# Patient Record
Sex: Male | Born: 1945 | Race: White | Hispanic: No | Marital: Married | Smoking: Never smoker
Health system: Southern US, Community
[De-identification: ages and names within clinical notes are randomized; demographics above are authoritative.]

## PROBLEM LIST (undated history)

## (undated) DIAGNOSIS — G2581 Restless legs syndrome: Secondary | ICD-10-CM

## (undated) DIAGNOSIS — F32A Depression, unspecified: Secondary | ICD-10-CM

## (undated) DIAGNOSIS — M1990 Unspecified osteoarthritis, unspecified site: Secondary | ICD-10-CM

## (undated) DIAGNOSIS — I10 Essential (primary) hypertension: Secondary | ICD-10-CM

## (undated) DIAGNOSIS — C801 Malignant (primary) neoplasm, unspecified: Secondary | ICD-10-CM

## (undated) DIAGNOSIS — E119 Type 2 diabetes mellitus without complications: Secondary | ICD-10-CM

## (undated) DIAGNOSIS — I639 Cerebral infarction, unspecified: Secondary | ICD-10-CM

## (undated) DIAGNOSIS — K449 Diaphragmatic hernia without obstruction or gangrene: Secondary | ICD-10-CM

## (undated) DIAGNOSIS — K759 Inflammatory liver disease, unspecified: Secondary | ICD-10-CM

## (undated) DIAGNOSIS — J449 Chronic obstructive pulmonary disease, unspecified: Secondary | ICD-10-CM

## (undated) DIAGNOSIS — F329 Major depressive disorder, single episode, unspecified: Secondary | ICD-10-CM

## (undated) DIAGNOSIS — K7460 Unspecified cirrhosis of liver: Secondary | ICD-10-CM

## (undated) DIAGNOSIS — K920 Hematemesis: Secondary | ICD-10-CM

## (undated) DIAGNOSIS — E785 Hyperlipidemia, unspecified: Secondary | ICD-10-CM

## (undated) DIAGNOSIS — K922 Gastrointestinal hemorrhage, unspecified: Secondary | ICD-10-CM

## (undated) DIAGNOSIS — G4733 Obstructive sleep apnea (adult) (pediatric): Secondary | ICD-10-CM

## (undated) DIAGNOSIS — Z8719 Personal history of other diseases of the digestive system: Secondary | ICD-10-CM

## (undated) HISTORY — DX: Hematemesis: K92.0

## (undated) HISTORY — PX: CARDIAC CATHETERIZATION: SHX172

## (undated) HISTORY — PX: EYE SURGERY: SHX253

## (undated) HISTORY — DX: Hyperlipidemia, unspecified: E78.5

## (undated) HISTORY — PX: NOSE SURGERY: SHX723

## (undated) HISTORY — PX: APPENDECTOMY: SHX54

## (undated) HISTORY — DX: Diaphragmatic hernia without obstruction or gangrene: K44.9

## (undated) HISTORY — DX: Restless legs syndrome: G25.81

## (undated) HISTORY — DX: Gastrointestinal hemorrhage, unspecified: K92.2

---

## 2004-03-09 ENCOUNTER — Other Ambulatory Visit: Payer: Self-pay

## 2004-03-26 DIAGNOSIS — E119 Type 2 diabetes mellitus without complications: Secondary | ICD-10-CM | POA: Insufficient documentation

## 2004-05-24 ENCOUNTER — Other Ambulatory Visit: Payer: Self-pay

## 2004-08-26 ENCOUNTER — Other Ambulatory Visit: Payer: Self-pay

## 2004-10-29 ENCOUNTER — Emergency Department: Payer: Self-pay | Admitting: Unknown Physician Specialty

## 2004-10-29 ENCOUNTER — Other Ambulatory Visit: Payer: Self-pay

## 2004-11-03 ENCOUNTER — Other Ambulatory Visit: Payer: Self-pay

## 2004-11-03 ENCOUNTER — Emergency Department: Payer: Self-pay | Admitting: Emergency Medicine

## 2005-09-19 ENCOUNTER — Emergency Department: Payer: Self-pay | Admitting: Emergency Medicine

## 2005-12-21 ENCOUNTER — Emergency Department: Payer: Self-pay | Admitting: General Practice

## 2005-12-21 ENCOUNTER — Other Ambulatory Visit: Payer: Self-pay

## 2006-08-19 ENCOUNTER — Other Ambulatory Visit: Payer: Self-pay

## 2006-08-19 ENCOUNTER — Emergency Department: Payer: Self-pay | Admitting: Emergency Medicine

## 2008-06-05 ENCOUNTER — Emergency Department: Payer: Self-pay | Admitting: Emergency Medicine

## 2008-06-05 ENCOUNTER — Other Ambulatory Visit: Payer: Self-pay

## 2008-06-28 ENCOUNTER — Emergency Department: Payer: Self-pay | Admitting: Emergency Medicine

## 2008-09-01 ENCOUNTER — Other Ambulatory Visit: Payer: Self-pay

## 2008-09-02 ENCOUNTER — Inpatient Hospital Stay: Payer: Self-pay | Admitting: Internal Medicine

## 2009-07-25 ENCOUNTER — Inpatient Hospital Stay: Payer: Self-pay | Admitting: *Deleted

## 2009-08-19 ENCOUNTER — Ambulatory Visit: Payer: Self-pay | Admitting: Internal Medicine

## 2010-05-25 ENCOUNTER — Emergency Department: Payer: Self-pay | Admitting: Unknown Physician Specialty

## 2010-05-28 ENCOUNTER — Ambulatory Visit: Payer: Self-pay | Admitting: Internal Medicine

## 2010-06-11 ENCOUNTER — Ambulatory Visit: Payer: Self-pay | Admitting: Ophthalmology

## 2010-06-13 ENCOUNTER — Emergency Department: Payer: Self-pay | Admitting: Emergency Medicine

## 2010-06-18 ENCOUNTER — Ambulatory Visit: Payer: Self-pay | Admitting: Ophthalmology

## 2010-07-08 ENCOUNTER — Ambulatory Visit: Payer: Self-pay | Admitting: Ophthalmology

## 2010-08-07 ENCOUNTER — Ambulatory Visit: Payer: Self-pay | Admitting: Ophthalmology

## 2010-08-13 ENCOUNTER — Ambulatory Visit: Payer: Self-pay | Admitting: Ophthalmology

## 2010-10-12 ENCOUNTER — Inpatient Hospital Stay: Payer: Self-pay | Admitting: Internal Medicine

## 2011-03-22 ENCOUNTER — Ambulatory Visit: Payer: Self-pay | Admitting: Internal Medicine

## 2011-03-23 ENCOUNTER — Inpatient Hospital Stay: Payer: Self-pay | Admitting: Internal Medicine

## 2011-04-10 ENCOUNTER — Ambulatory Visit: Payer: Self-pay | Admitting: Internal Medicine

## 2011-04-21 ENCOUNTER — Ambulatory Visit: Payer: Self-pay | Admitting: Internal Medicine

## 2011-05-04 ENCOUNTER — Inpatient Hospital Stay: Payer: Self-pay | Admitting: Internal Medicine

## 2011-12-22 ENCOUNTER — Ambulatory Visit: Payer: Self-pay | Admitting: Internal Medicine

## 2012-01-04 ENCOUNTER — Inpatient Hospital Stay: Payer: Self-pay | Admitting: Internal Medicine

## 2012-01-04 LAB — URINALYSIS, COMPLETE
Bilirubin,UR: NEGATIVE
Hyaline Cast: 1
Ketone: NEGATIVE
Leukocyte Esterase: NEGATIVE
Nitrite: NEGATIVE
Ph: 5 (ref 4.5–8.0)
Protein: NEGATIVE
Specific Gravity: 1.012 (ref 1.003–1.030)
Squamous Epithelial: NONE SEEN

## 2012-01-04 LAB — IRON AND TIBC
Iron Bind.Cap.(Total): 445 ug/dL (ref 250–450)
Iron Saturation: 14 %
Unbound Iron-Bind.Cap.: 384 ug/dL

## 2012-01-04 LAB — FOLATE: Folic Acid: 25.9 ng/mL (ref 3.1–100.0)

## 2012-01-04 LAB — COMPREHENSIVE METABOLIC PANEL
Albumin: 3.3 g/dL — ABNORMAL LOW (ref 3.4–5.0)
Alkaline Phosphatase: 77 U/L (ref 50–136)
Anion Gap: 10 (ref 7–16)
BUN: 43 mg/dL — ABNORMAL HIGH (ref 7–18)
Bilirubin,Total: 0.5 mg/dL (ref 0.2–1.0)
Calcium, Total: 8.6 mg/dL (ref 8.5–10.1)
Creatinine: 1.77 mg/dL — ABNORMAL HIGH (ref 0.60–1.30)
EGFR (African American): 50 — ABNORMAL LOW
Glucose: 265 mg/dL — ABNORMAL HIGH (ref 65–99)
Osmolality: 301 (ref 275–301)
Potassium: 4.8 mmol/L (ref 3.5–5.1)
Sodium: 141 mmol/L (ref 136–145)
Total Protein: 7.7 g/dL (ref 6.4–8.2)

## 2012-01-04 LAB — TSH: Thyroid Stimulating Horm: 2.75 u[IU]/mL

## 2012-01-04 LAB — TROPONIN I
Troponin-I: <0.02 ng/mL
Troponin-I: <0.02 ng/mL

## 2012-01-04 LAB — CBC
HCT: 20.5 % — ABNORMAL LOW (ref 40.0–52.0)
HGB: 6.9 g/dL — ABNORMAL LOW (ref 13.0–18.0)
MCH: 32.1 pg (ref 26.0–34.0)
MCV: 96 fL (ref 80–100)
Platelet: 76 10*3/uL — ABNORMAL LOW (ref 150–440)
RBC: 2.14 10*6/uL — ABNORMAL LOW (ref 4.40–5.90)
RDW: 17.3 % — ABNORMAL HIGH (ref 11.5–14.5)

## 2012-01-04 LAB — PROTIME-INR
INR: 1.2
Prothrombin Time: 15.3 secs — ABNORMAL HIGH (ref 11.5–14.7)

## 2012-01-05 LAB — CBC WITH DIFFERENTIAL/PLATELET
Basophil #: 0.1 10*3/uL (ref 0.0–0.1)
Basophil #: 0 10*3/uL (ref 0.0–0.1)
Basophil %: 0.9 %
Basophil %: 0.4 %
Eosinophil #: 0.2 10*3/uL (ref 0.0–0.7)
Eosinophil #: 0.2 10*3/uL (ref 0.0–0.7)
HGB: 8.1 g/dL — ABNORMAL LOW (ref 13.0–18.0)
HGB: 8.3 g/dL — ABNORMAL LOW (ref 13.0–18.0)
Lymphocyte #: 0.9 10*3/uL — ABNORMAL LOW (ref 1.0–3.6)
Lymphocyte %: 14.4 %
MCH: 31.3 pg (ref 26.0–34.0)
MCH: 31.3 pg (ref 26.0–34.0)
MCHC: 33.4 g/dL (ref 32.0–36.0)
MCHC: 33.7 g/dL (ref 32.0–36.0)
MCV: 93 fL (ref 80–100)
Monocyte #: 0.5 10*3/uL (ref 0.0–0.7)
Monocyte #: 0.5 10*3/uL (ref 0.0–0.7)
Monocyte %: 7.2 %
Neutrophil #: 4.8 10*3/uL (ref 1.4–6.5)
Neutrophil #: 6.4 10*3/uL (ref 1.4–6.5)
Neutrophil %: 74 %
Neutrophil %: 80.4 %
Platelet: 79 10*3/uL — ABNORMAL LOW (ref 150–440)
RDW: 19.5 % — ABNORMAL HIGH (ref 11.5–14.5)
RDW: 19 % — ABNORMAL HIGH (ref 11.5–14.5)
WBC: 6.5 10*3/uL (ref 3.8–10.6)

## 2012-01-05 LAB — PROTIME-INR: Prothrombin Time: 15.6 secs — ABNORMAL HIGH (ref 11.5–14.7)

## 2012-01-05 LAB — COMPREHENSIVE METABOLIC PANEL
Albumin: 3.4 g/dL (ref 3.4–5.0)
Alkaline Phosphatase: 68 U/L (ref 50–136)
Bilirubin,Total: 1 mg/dL (ref 0.2–1.0)
Chloride: 111 mmol/L — ABNORMAL HIGH (ref 98–107)
EGFR (African American): >60
Glucose: 117 mg/dL — ABNORMAL HIGH (ref 65–99)
Potassium: 4.3 mmol/L (ref 3.5–5.1)
SGOT(AST): 63 U/L — ABNORMAL HIGH (ref 15–37)
SGPT (ALT): 30 U/L
Sodium: 148 mmol/L — ABNORMAL HIGH (ref 136–145)
Total Protein: 7.9 g/dL (ref 6.4–8.2)

## 2012-01-05 LAB — PROTEIN ELECTROPHORESIS(ARMC)

## 2012-01-05 LAB — BASIC METABOLIC PANEL
Anion Gap: 8 (ref 7–16)
Chloride: 109 mmol/L — ABNORMAL HIGH (ref 98–107)
Glucose: 97 mg/dL (ref 65–99)
Osmolality: 291 (ref 275–301)

## 2012-01-05 LAB — MAGNESIUM: Magnesium: 1.8 mg/dL

## 2012-01-05 LAB — TROPONIN I: Troponin-I: <0.02 ng/mL

## 2012-01-05 LAB — RAPID INFLUENZA A&B ANTIGENS

## 2012-01-06 LAB — CBC WITH DIFFERENTIAL/PLATELET
Basophil #: 0 10*3/uL (ref 0.0–0.1)
Eosinophil %: 2 %
Lymphocyte %: 18.6 %
MCHC: 34.2 g/dL (ref 32.0–36.0)
Monocyte %: 7.7 %
Neutrophil %: 71.1 %
Platelet: 65 10*3/uL — ABNORMAL LOW (ref 150–440)
RBC: 2.38 10*6/uL — ABNORMAL LOW (ref 4.40–5.90)
RDW: 18.7 % — ABNORMAL HIGH (ref 11.5–14.5)
WBC: 6.3 10*3/uL (ref 3.8–10.6)

## 2012-01-06 LAB — BASIC METABOLIC PANEL
Anion Gap: 11 (ref 7–16)
Calcium, Total: 8.5 mg/dL (ref 8.5–10.1)
Co2: 27 mmol/L (ref 21–32)
EGFR (African American): 56 — ABNORMAL LOW
EGFR (Non-African Amer.): 46 — ABNORMAL LOW
Osmolality: 297 (ref 275–301)

## 2012-01-06 LAB — HEMOGLOBIN: HGB: 9 g/dL — ABNORMAL LOW (ref 13.0–18.0)

## 2012-01-07 LAB — BASIC METABOLIC PANEL
Anion Gap: 13 (ref 7–16)
BUN: 41 mg/dL — ABNORMAL HIGH (ref 7–18)
Calcium, Total: 7.9 mg/dL — ABNORMAL LOW (ref 8.5–10.1)
Co2: 24 mmol/L (ref 21–32)
Creatinine: 1.75 mg/dL — ABNORMAL HIGH (ref 0.60–1.30)
EGFR (African American): 51 — ABNORMAL LOW
EGFR (Non-African Amer.): 42 — ABNORMAL LOW
Potassium: 4.1 mmol/L (ref 3.5–5.1)
Sodium: 137 mmol/L (ref 136–145)

## 2012-01-07 LAB — HEMOGLOBIN: HGB: 8 g/dL — ABNORMAL LOW (ref 13.0–18.0)

## 2012-01-08 LAB — BASIC METABOLIC PANEL
Anion Gap: 11 (ref 7–16)
BUN: 43 mg/dL — ABNORMAL HIGH (ref 7–18)
Co2: 25 mmol/L (ref 21–32)
Creatinine: 1.8 mg/dL — ABNORMAL HIGH (ref 0.60–1.30)
EGFR (African American): 49 — ABNORMAL LOW
EGFR (Non-African Amer.): 41 — ABNORMAL LOW
Osmolality: 286 (ref 275–301)
Sodium: 138 mmol/L (ref 136–145)

## 2012-01-08 LAB — HEMOGLOBIN: HGB: 8.4 g/dL — ABNORMAL LOW (ref 13.0–18.0)

## 2012-01-09 LAB — HEMOGLOBIN: HGB: 8.6 g/dL — ABNORMAL LOW (ref 13.0–18.0)

## 2012-01-09 LAB — BASIC METABOLIC PANEL
Anion Gap: 11 (ref 7–16)
BUN: 39 mg/dL — ABNORMAL HIGH (ref 7–18)
Chloride: 100 mmol/L (ref 98–107)
Glucose: 119 mg/dL — ABNORMAL HIGH (ref 65–99)
Osmolality: 281 (ref 275–301)
Sodium: 135 mmol/L — ABNORMAL LOW (ref 136–145)

## 2012-01-09 LAB — URINE CULTURE

## 2012-01-09 LAB — AMMONIA: Ammonia, Plasma: 33 mcmol/L — ABNORMAL HIGH (ref 11–32)

## 2012-01-10 LAB — BASIC METABOLIC PANEL
Anion Gap: 9 (ref 7–16)
BUN: 36 mg/dL — ABNORMAL HIGH (ref 7–18)
Co2: 24 mmol/L (ref 21–32)
EGFR (African American): >60
EGFR (Non-African Amer.): 56 — ABNORMAL LOW
Glucose: 248 mg/dL — ABNORMAL HIGH (ref 65–99)
Potassium: 4.4 mmol/L (ref 3.5–5.1)
Sodium: 133 mmol/L — ABNORMAL LOW (ref 136–145)

## 2012-01-10 LAB — AMMONIA: Ammonia, Plasma: 47 mcmol/L — ABNORMAL HIGH (ref 11–32)

## 2012-01-10 LAB — HEMOGLOBIN: HGB: 8 g/dL — ABNORMAL LOW (ref 13.0–18.0)

## 2012-01-11 LAB — CBC WITH DIFFERENTIAL/PLATELET
Basophil #: 0.1 10*3/uL (ref 0.0–0.1)
Basophil %: 0.9 %
Eosinophil #: 0.3 10*3/uL (ref 0.0–0.7)
Eosinophil %: 4.6 %
HCT: 25.6 % — ABNORMAL LOW (ref 40.0–52.0)
HGB: 8.6 g/dL — ABNORMAL LOW (ref 13.0–18.0)
MCH: 31.7 pg (ref 26.0–34.0)
MCV: 94 fL (ref 80–100)
Monocyte %: 7.3 %
Platelet: 86 10*3/uL — ABNORMAL LOW (ref 150–440)
RBC: 2.72 10*6/uL — ABNORMAL LOW (ref 4.40–5.90)
RDW: 17 % — ABNORMAL HIGH (ref 11.5–14.5)
WBC: 5.6 10*3/uL (ref 3.8–10.6)

## 2012-01-11 LAB — BASIC METABOLIC PANEL
Anion Gap: 8 (ref 7–16)
Calcium, Total: 8.7 mg/dL (ref 8.5–10.1)
Co2: 25 mmol/L (ref 21–32)
EGFR (Non-African Amer.): >60
Glucose: 148 mg/dL — ABNORMAL HIGH (ref 65–99)
Osmolality: 278 (ref 275–301)
Potassium: 4.5 mmol/L (ref 3.5–5.1)
Sodium: 134 mmol/L — ABNORMAL LOW (ref 136–145)

## 2012-01-22 ENCOUNTER — Ambulatory Visit: Payer: Self-pay | Admitting: Internal Medicine

## 2012-02-10 ENCOUNTER — Other Ambulatory Visit: Payer: Self-pay | Admitting: Internal Medicine

## 2012-02-10 LAB — LIPID PANEL
Cholesterol: 147 mg/dL (ref 0–200)
VLDL Cholesterol, Calc: 42 mg/dL — ABNORMAL HIGH (ref 5–40)

## 2012-02-10 LAB — CBC WITH DIFFERENTIAL/PLATELET
Basophil #: 0 10*3/uL (ref 0.0–0.1)
Eosinophil #: 0.3 10*3/uL (ref 0.0–0.7)
HCT: 27.2 % — ABNORMAL LOW (ref 40.0–52.0)
HGB: 9.3 g/dL — ABNORMAL LOW (ref 13.0–18.0)
Lymphocyte #: 0.8 10*3/uL — ABNORMAL LOW (ref 1.0–3.6)
Lymphocyte %: 18.6 %
MCHC: 34.2 g/dL (ref 32.0–36.0)
MCV: 97 fL (ref 80–100)
Monocyte %: 6.2 %
Neutrophil #: 3.1 10*3/uL (ref 1.4–6.5)
Platelet: 65 10*3/uL — ABNORMAL LOW (ref 150–440)
RDW: 17.4 % — ABNORMAL HIGH (ref 11.5–14.5)
WBC: 4.5 10*3/uL (ref 3.8–10.6)

## 2012-02-10 LAB — COMPREHENSIVE METABOLIC PANEL
Albumin: 4 g/dL (ref 3.4–5.0)
Alkaline Phosphatase: 85 U/L (ref 50–136)
Anion Gap: 6 — ABNORMAL LOW (ref 7–16)
Bilirubin,Total: 0.5 mg/dL (ref 0.2–1.0)
Calcium, Total: 9.5 mg/dL (ref 8.5–10.1)
EGFR (Non-African Amer.): 48 — ABNORMAL LOW
Glucose: 243 mg/dL — ABNORMAL HIGH (ref 65–99)
Osmolality: 288 (ref 275–301)
SGOT(AST): 66 U/L — ABNORMAL HIGH (ref 15–37)
SGPT (ALT): 42 U/L
Sodium: 136 mmol/L (ref 136–145)
Total Protein: 9.3 g/dL — ABNORMAL HIGH (ref 6.4–8.2)

## 2012-02-10 LAB — AMMONIA: Ammonia, Plasma: 78 mcmol/L — ABNORMAL HIGH (ref 11–32)

## 2012-03-30 ENCOUNTER — Emergency Department: Payer: Self-pay | Admitting: *Deleted

## 2012-03-30 LAB — COMPREHENSIVE METABOLIC PANEL
Albumin: 3.6 g/dL (ref 3.4–5.0)
Alkaline Phosphatase: 101 U/L (ref 50–136)
Anion Gap: 11 (ref 7–16)
BUN: 32 mg/dL — ABNORMAL HIGH (ref 7–18)
Chloride: 100 mmol/L (ref 98–107)
Creatinine: 1.42 mg/dL — ABNORMAL HIGH (ref 0.60–1.30)
EGFR (Non-African Amer.): 53 — ABNORMAL LOW
Glucose: 297 mg/dL — ABNORMAL HIGH (ref 65–99)
Osmolality: 286 (ref 275–301)
Potassium: 4.4 mmol/L (ref 3.5–5.1)
SGOT(AST): 76 U/L — ABNORMAL HIGH (ref 15–37)
SGPT (ALT): 44 U/L
Sodium: 134 mmol/L — ABNORMAL LOW (ref 136–145)
Total Protein: 8.7 g/dL — ABNORMAL HIGH (ref 6.4–8.2)

## 2012-03-30 LAB — CBC
HGB: 9.2 g/dL — ABNORMAL LOW (ref 13.0–18.0)
MCH: 32.9 pg (ref 26.0–34.0)
MCHC: 34.6 g/dL (ref 32.0–36.0)
Platelet: 63 10*3/uL — ABNORMAL LOW (ref 150–440)
RBC: 2.79 10*6/uL — ABNORMAL LOW (ref 4.40–5.90)
WBC: 4.7 10*3/uL (ref 3.8–10.6)

## 2012-03-30 LAB — CK TOTAL AND CKMB (NOT AT ARMC): CK, Total: 77 U/L (ref 35–232)

## 2012-03-30 LAB — AMMONIA: Ammonia, Plasma: 99 mcmol/L — ABNORMAL HIGH (ref 11–32)

## 2012-04-11 ENCOUNTER — Other Ambulatory Visit: Payer: Self-pay | Admitting: Internal Medicine

## 2012-04-11 LAB — URINALYSIS, COMPLETE
Bilirubin,UR: NEGATIVE
Blood: NEGATIVE
Ketone: NEGATIVE
Nitrite: NEGATIVE
Ph: 5 (ref 4.5–8.0)
Protein: NEGATIVE
Specific Gravity: 1.012 (ref 1.003–1.030)
Squamous Epithelial: <1
WBC UR: NONE SEEN /HPF (ref 0–5)

## 2012-04-11 LAB — COMPREHENSIVE METABOLIC PANEL
Anion Gap: 9 (ref 7–16)
Bilirubin,Total: 0.5 mg/dL (ref 0.2–1.0)
Calcium, Total: 9 mg/dL (ref 8.5–10.1)
Chloride: 106 mmol/L (ref 98–107)
Creatinine: 1.55 mg/dL — ABNORMAL HIGH (ref 0.60–1.30)
EGFR (Non-African Amer.): 47 — ABNORMAL LOW
Glucose: 168 mg/dL — ABNORMAL HIGH (ref 65–99)
Osmolality: 293 (ref 275–301)
Potassium: 5.5 mmol/L — ABNORMAL HIGH (ref 3.5–5.1)
SGOT(AST): 87 U/L — ABNORMAL HIGH (ref 15–37)
SGPT (ALT): 53 U/L

## 2012-04-11 LAB — CBC WITH DIFFERENTIAL/PLATELET
Basophil %: 1.2 %
Eosinophil #: 0.2 10*3/uL (ref 0.0–0.7)
Eosinophil %: 4.2 %
HCT: 27.9 % — ABNORMAL LOW (ref 40.0–52.0)
Lymphocyte #: 0.9 10*3/uL — ABNORMAL LOW (ref 1.0–3.6)
MCHC: 33.1 g/dL (ref 32.0–36.0)
MCV: 97 fL (ref 80–100)
Monocyte %: 5.6 %
Neutrophil #: 2.9 10*3/uL (ref 1.4–6.5)

## 2012-04-11 LAB — AMMONIA: Ammonia, Plasma: 55 mcmol/L — ABNORMAL HIGH (ref 11–32)

## 2012-04-12 LAB — URINE CULTURE

## 2012-05-09 ENCOUNTER — Other Ambulatory Visit: Payer: Self-pay | Admitting: Internal Medicine

## 2012-05-09 LAB — AMMONIA: Ammonia, Plasma: 68 mcmol/L — ABNORMAL HIGH (ref 11–32)

## 2012-06-06 ENCOUNTER — Other Ambulatory Visit: Payer: Self-pay | Admitting: Internal Medicine

## 2012-06-06 LAB — CBC WITH DIFFERENTIAL/PLATELET
Basophil #: 0 10*3/uL (ref 0.0–0.1)
Basophil %: 1.1 %
Eosinophil #: 0.2 10*3/uL (ref 0.0–0.7)
Eosinophil %: 5.5 %
HCT: 24.8 % — ABNORMAL LOW (ref 40.0–52.0)
HGB: 8.3 g/dL — ABNORMAL LOW (ref 13.0–18.0)
Lymphocyte #: 0.6 10*3/uL — ABNORMAL LOW (ref 1.0–3.6)
Lymphocyte %: 15.7 %
MCH: 33.9 pg (ref 26.0–34.0)
MCHC: 33.3 g/dL (ref 32.0–36.0)
MCV: 102 fL — ABNORMAL HIGH (ref 80–100)
Monocyte #: 0.2 x10 3/mm (ref 0.2–1.0)
Monocyte %: 5.6 %
Neutrophil #: 3 10*3/uL (ref 1.4–6.5)
Neutrophil %: 72.1 %
Platelet: 62 10*3/uL — ABNORMAL LOW (ref 150–440)
RBC: 2.44 10*6/uL — ABNORMAL LOW (ref 4.40–5.90)
RDW: 16.7 % — ABNORMAL HIGH (ref 11.5–14.5)
WBC: 4.1 10*3/uL (ref 3.8–10.6)

## 2012-06-06 LAB — COMPREHENSIVE METABOLIC PANEL
Albumin: 3.6 g/dL (ref 3.4–5.0)
Alkaline Phosphatase: 118 U/L (ref 50–136)
Anion Gap: 7 (ref 7–16)
BUN: 36 mg/dL — ABNORMAL HIGH (ref 7–18)
Bilirubin,Total: 0.4 mg/dL (ref 0.2–1.0)
Calcium, Total: 8.9 mg/dL (ref 8.5–10.1)
Chloride: 107 mmol/L (ref 98–107)
Co2: 26 mmol/L (ref 21–32)
Creatinine: 1.38 mg/dL — ABNORMAL HIGH (ref 0.60–1.30)
EGFR (African American): >60
EGFR (Non-African Amer.): 53 — ABNORMAL LOW
Glucose: 266 mg/dL — ABNORMAL HIGH (ref 65–99)
Osmolality: 297 (ref 275–301)
Potassium: 4.9 mmol/L (ref 3.5–5.1)
SGOT(AST): 68 U/L — ABNORMAL HIGH (ref 15–37)
SGPT (ALT): 43 U/L
Sodium: 140 mmol/L (ref 136–145)
Total Protein: 8.2 g/dL (ref 6.4–8.2)

## 2012-07-01 ENCOUNTER — Ambulatory Visit: Payer: Self-pay | Admitting: Unknown Physician Specialty

## 2012-07-01 LAB — CBC WITH DIFFERENTIAL/PLATELET
Basophil #: 0.1 10*3/uL (ref 0.0–0.1)
Basophil %: 1.2 %
Eosinophil #: 0.3 10*3/uL (ref 0.0–0.7)
HCT: 23.9 % — ABNORMAL LOW (ref 40.0–52.0)
Lymphocyte %: 16.9 %
MCHC: 33.2 g/dL (ref 32.0–36.0)
MCV: 100 fL (ref 80–100)
Monocyte #: 0.3 x10 3/mm (ref 0.2–1.0)
Neutrophil #: 3.3 10*3/uL (ref 1.4–6.5)
Neutrophil %: 70.2 %
Platelet: 71 10*3/uL — ABNORMAL LOW (ref 150–440)
RBC: 2.39 10*6/uL — ABNORMAL LOW (ref 4.40–5.90)
RDW: 16.7 % — ABNORMAL HIGH (ref 11.5–14.5)
WBC: 4.7 10*3/uL (ref 3.8–10.6)

## 2012-07-25 ENCOUNTER — Ambulatory Visit: Payer: Self-pay | Admitting: Internal Medicine

## 2012-07-25 ENCOUNTER — Ambulatory Visit: Payer: Self-pay | Admitting: Unknown Physician Specialty

## 2012-07-29 ENCOUNTER — Emergency Department: Payer: Self-pay | Admitting: Emergency Medicine

## 2012-07-29 LAB — BASIC METABOLIC PANEL
Anion Gap: 7 (ref 7–16)
BUN: 29 mg/dL — ABNORMAL HIGH (ref 7–18)
Calcium, Total: 8.5 mg/dL (ref 8.5–10.1)
Creatinine: 1.48 mg/dL — ABNORMAL HIGH (ref 0.60–1.30)
EGFR (African American): 57 — ABNORMAL LOW
EGFR (Non-African Amer.): 49 — ABNORMAL LOW
Glucose: 51 mg/dL — ABNORMAL LOW (ref 65–99)
Osmolality: 283 (ref 275–301)
Potassium: 4.2 mmol/L (ref 3.5–5.1)

## 2012-07-29 LAB — URINALYSIS, COMPLETE
Bilirubin,UR: NEGATIVE
Ketone: NEGATIVE
Ph: 5 (ref 4.5–8.0)
Protein: NEGATIVE
RBC,UR: NONE SEEN /HPF (ref 0–5)
Specific Gravity: 1.006 (ref 1.003–1.030)
Squamous Epithelial: <1
WBC UR: <1 /HPF (ref 0–5)

## 2012-07-29 LAB — CBC WITH DIFFERENTIAL/PLATELET
Basophil #: 0.1 10*3/uL (ref 0.0–0.1)
Basophil %: 1.3 %
Eosinophil #: 0.3 10*3/uL (ref 0.0–0.7)
Eosinophil %: 6.5 %
HCT: 23.1 % — ABNORMAL LOW (ref 40.0–52.0)
HGB: 7.8 g/dL — ABNORMAL LOW (ref 13.0–18.0)
Lymphocyte %: 16.9 %
MCH: 34 pg (ref 26.0–34.0)
MCHC: 34 g/dL (ref 32.0–36.0)
Monocyte #: 0.3 x10 3/mm (ref 0.2–1.0)
WBC: 4.6 10*3/uL (ref 3.8–10.6)

## 2012-08-05 LAB — CULTURE, BLOOD (SINGLE)

## 2012-08-15 LAB — CBC CANCER CENTER
Basophil #: 0.1 x10 3/mm (ref 0.0–0.1)
Eosinophil #: 0.3 x10 3/mm (ref 0.0–0.7)
Eosinophil %: 8.4 %
Lymphocyte %: 20.4 %
Monocyte %: 7.2 %
Neutrophil #: 2.4 x10 3/mm (ref 1.4–6.5)
Neutrophil %: 62.3 %
Platelet: 59 x10 3/mm — ABNORMAL LOW (ref 150–440)
RBC: 2.59 10*6/uL — ABNORMAL LOW (ref 4.40–5.90)
RDW: 17 % — ABNORMAL HIGH (ref 11.5–14.5)
WBC: 3.8 x10 3/mm (ref 3.8–10.6)

## 2012-08-15 LAB — RETICULOCYTES: Absolute Retic Count: 0.0646 10*6/uL (ref 0.031–0.129)

## 2012-08-15 LAB — FOLATE: Folic Acid: 13.4 ng/mL (ref 3.1–100.0)

## 2012-08-16 LAB — PROT IMMUNOELECTROPHORES(ARMC)

## 2012-08-21 ENCOUNTER — Ambulatory Visit: Payer: Self-pay | Admitting: Internal Medicine

## 2012-08-29 ENCOUNTER — Ambulatory Visit: Payer: Self-pay

## 2012-08-29 LAB — AMMONIA: Ammonia, Plasma: 93 mcmol/L — ABNORMAL HIGH (ref 11–32)

## 2012-10-03 DIAGNOSIS — N281 Cyst of kidney, acquired: Secondary | ICD-10-CM | POA: Insufficient documentation

## 2012-10-24 ENCOUNTER — Ambulatory Visit: Payer: Self-pay | Admitting: Internal Medicine

## 2012-10-24 LAB — CBC CANCER CENTER
Basophil %: 1.3 %
Eosinophil %: 6.9 %
HCT: 29.9 % — ABNORMAL LOW (ref 40.0–52.0)
HGB: 9.8 g/dL — ABNORMAL LOW (ref 13.0–18.0)
Lymphocyte #: 0.7 x10 3/mm — ABNORMAL LOW (ref 1.0–3.6)
MCH: 32.3 pg (ref 26.0–34.0)
MCV: 99 fL (ref 80–100)
Monocyte #: 0.3 x10 3/mm (ref 0.2–1.0)
Neutrophil #: 2.8 x10 3/mm (ref 1.4–6.5)
Platelet: 61 x10 3/mm — ABNORMAL LOW (ref 150–440)
RBC: 3.03 10*6/uL — ABNORMAL LOW (ref 4.40–5.90)

## 2012-10-24 LAB — IRON AND TIBC
Iron Saturation: 17 %
Unbound Iron-Bind.Cap.: 340 ug/dL

## 2012-10-24 LAB — FERRITIN: Ferritin (ARMC): 90 ng/mL (ref 8–388)

## 2012-11-20 ENCOUNTER — Ambulatory Visit: Payer: Self-pay | Admitting: Internal Medicine

## 2012-11-28 ENCOUNTER — Other Ambulatory Visit: Payer: Self-pay | Admitting: Unknown Physician Specialty

## 2012-12-26 ENCOUNTER — Other Ambulatory Visit: Payer: Self-pay

## 2012-12-26 LAB — CBC WITH DIFFERENTIAL/PLATELET
Eosinophil #: 0.2 10*3/uL (ref 0.0–0.7)
Eosinophil %: 5.7 %
HCT: 26.2 % — ABNORMAL LOW (ref 40.0–52.0)
HGB: 8.8 g/dL — ABNORMAL LOW (ref 13.0–18.0)
Lymphocyte #: 0.8 10*3/uL — ABNORMAL LOW (ref 1.0–3.6)
Lymphocyte %: 20.4 %
MCH: 32.7 pg (ref 26.0–34.0)
MCHC: 33.6 g/dL (ref 32.0–36.0)
Monocyte %: 6.9 %
Platelet: 56 10*3/uL — ABNORMAL LOW (ref 150–440)
RBC: 2.69 10*6/uL — ABNORMAL LOW (ref 4.40–5.90)
WBC: 4 10*3/uL (ref 3.8–10.6)

## 2012-12-26 LAB — COMPREHENSIVE METABOLIC PANEL
Alkaline Phosphatase: 194 U/L — ABNORMAL HIGH (ref 50–136)
BUN: 29 mg/dL — ABNORMAL HIGH (ref 7–18)
Chloride: 114 mmol/L — ABNORMAL HIGH (ref 98–107)
Co2: 18 mmol/L — ABNORMAL LOW (ref 21–32)
EGFR (African American): >60
Glucose: 137 mg/dL — ABNORMAL HIGH (ref 65–99)
Osmolality: 287 (ref 275–301)
Potassium: 5.4 mmol/L — ABNORMAL HIGH (ref 3.5–5.1)
SGOT(AST): 70 U/L — ABNORMAL HIGH (ref 15–37)
Total Protein: 8.5 g/dL — ABNORMAL HIGH (ref 6.4–8.2)

## 2012-12-28 ENCOUNTER — Ambulatory Visit: Payer: Self-pay | Admitting: Unknown Physician Specialty

## 2013-01-06 ENCOUNTER — Other Ambulatory Visit: Payer: Self-pay | Admitting: Unknown Physician Specialty

## 2013-01-06 LAB — PROTIME-INR: Prothrombin Time: 14.9 secs — ABNORMAL HIGH (ref 11.5–14.7)

## 2013-01-21 ENCOUNTER — Ambulatory Visit: Payer: Self-pay | Admitting: Internal Medicine

## 2013-02-13 LAB — CBC CANCER CENTER
Basophil %: 1.3 %
Eosinophil #: 0.3 x10 3/mm (ref 0.0–0.7)
Eosinophil %: 5.6 %
HCT: 26 % — ABNORMAL LOW (ref 40.0–52.0)
HGB: 8.5 g/dL — ABNORMAL LOW (ref 13.0–18.0)
MCH: 32.3 pg (ref 26.0–34.0)
MCHC: 32.8 g/dL (ref 32.0–36.0)
Monocyte #: 0.4 x10 3/mm (ref 0.2–1.0)
Monocyte %: 8.2 %
Neutrophil %: 69.6 %
Platelet: 56 x10 3/mm — ABNORMAL LOW (ref 150–440)
RBC: 2.64 10*6/uL — ABNORMAL LOW (ref 4.40–5.90)
RDW: 18.3 % — ABNORMAL HIGH (ref 11.5–14.5)
WBC: 4.5 x10 3/mm (ref 3.8–10.6)

## 2013-02-13 LAB — FERRITIN: Ferritin (ARMC): 60 ng/mL (ref 8–388)

## 2013-02-13 LAB — IRON AND TIBC: Iron: 60 ug/dL — ABNORMAL LOW (ref 65–175)

## 2013-02-18 ENCOUNTER — Ambulatory Visit: Payer: Self-pay | Admitting: Internal Medicine

## 2013-02-25 ENCOUNTER — Emergency Department: Payer: Self-pay | Admitting: Emergency Medicine

## 2013-02-26 ENCOUNTER — Ambulatory Visit: Payer: Self-pay | Admitting: Internal Medicine

## 2013-05-21 ENCOUNTER — Ambulatory Visit: Payer: Self-pay | Admitting: Internal Medicine

## 2013-06-12 LAB — CBC CANCER CENTER
Basophil %: 1.2 %
Eosinophil #: 0.3 x10 3/mm (ref 0.0–0.7)
HCT: 28.5 % — ABNORMAL LOW (ref 40.0–52.0)
HGB: 10 g/dL — ABNORMAL LOW (ref 13.0–18.0)
Lymphocyte %: 17.7 %
MCV: 97 fL (ref 80–100)
Neutrophil #: 2.6 x10 3/mm (ref 1.4–6.5)
Neutrophil %: 66.9 %
RBC: 2.94 10*6/uL — ABNORMAL LOW (ref 4.40–5.90)
WBC: 3.9 x10 3/mm (ref 3.8–10.6)

## 2013-06-12 LAB — FERRITIN: Ferritin (ARMC): 104 ng/mL (ref 8–388)

## 2013-06-12 LAB — IRON AND TIBC
Iron Bind.Cap.(Total): 334 ug/dL (ref 250–450)
Iron Saturation: 23 %
Unbound Iron-Bind.Cap.: 258 ug/dL

## 2013-06-20 ENCOUNTER — Ambulatory Visit: Payer: Self-pay | Admitting: Internal Medicine

## 2013-06-27 ENCOUNTER — Ambulatory Visit: Payer: Self-pay | Admitting: Unknown Physician Specialty

## 2013-10-02 ENCOUNTER — Ambulatory Visit: Payer: Self-pay | Admitting: Internal Medicine

## 2013-10-02 LAB — CBC CANCER CENTER
Basophil #: 0.1 x10 3/mm (ref 0.0–0.1)
Basophil %: 1.2 %
Eosinophil #: 0.3 x10 3/mm (ref 0.0–0.7)
Eosinophil %: 6.2 %
MCH: 33.7 pg (ref 26.0–34.0)
MCHC: 34.3 g/dL (ref 32.0–36.0)
MCV: 99 fL (ref 80–100)
Monocyte #: 0.3 x10 3/mm (ref 0.2–1.0)
Monocyte %: 6.6 %
Neutrophil #: 3.4 x10 3/mm (ref 1.4–6.5)
RBC: 2.65 10*6/uL — ABNORMAL LOW (ref 4.40–5.90)
RDW: 16.8 % — ABNORMAL HIGH (ref 11.5–14.5)

## 2013-10-02 LAB — IRON AND TIBC
Iron: 66 ug/dL (ref 65–175)
Unbound Iron-Bind.Cap.: 309 ug/dL

## 2013-10-03 ENCOUNTER — Ambulatory Visit: Payer: Self-pay | Admitting: Urology

## 2013-10-21 ENCOUNTER — Ambulatory Visit: Payer: Self-pay | Admitting: Internal Medicine

## 2014-01-22 ENCOUNTER — Ambulatory Visit: Payer: Self-pay | Admitting: Internal Medicine

## 2014-01-22 LAB — CBC CANCER CENTER
BASOS ABS: 0.1 x10 3/mm (ref 0.0–0.1)
BASOS PCT: 1.3 %
Eosinophil #: 0.3 x10 3/mm (ref 0.0–0.7)
Eosinophil %: 5.9 %
HCT: 31.9 % — AB (ref 40.0–52.0)
HGB: 10.6 g/dL — ABNORMAL LOW (ref 13.0–18.0)
LYMPHS PCT: 18.4 %
Lymphocyte #: 0.9 x10 3/mm — ABNORMAL LOW (ref 1.0–3.6)
MCH: 31.8 pg (ref 26.0–34.0)
MCHC: 33 g/dL (ref 32.0–36.0)
MCV: 96 fL (ref 80–100)
MONO ABS: 0.4 x10 3/mm (ref 0.2–1.0)
Monocyte %: 8 %
NEUTROS ABS: 3.1 x10 3/mm (ref 1.4–6.5)
NEUTROS PCT: 66.4 %
Platelet: 67 x10 3/mm — ABNORMAL LOW (ref 150–440)
RBC: 3.32 10*6/uL — ABNORMAL LOW (ref 4.40–5.90)
RDW: 17 % — ABNORMAL HIGH (ref 11.5–14.5)
WBC: 4.7 x10 3/mm (ref 3.8–10.6)

## 2014-01-22 LAB — IRON AND TIBC
Iron Bind.Cap.(Total): 354 ug/dL (ref 250–450)
Iron Saturation: 22 %
Iron: 79 ug/dL (ref 65–175)
Unbound Iron-Bind.Cap.: 275 ug/dL

## 2014-01-22 LAB — FERRITIN: FERRITIN (ARMC): 106 ng/mL (ref 8–388)

## 2014-02-18 ENCOUNTER — Ambulatory Visit: Payer: Self-pay | Admitting: Internal Medicine

## 2014-05-21 ENCOUNTER — Ambulatory Visit: Payer: Self-pay | Admitting: Internal Medicine

## 2014-05-21 DIAGNOSIS — J9600 Acute respiratory failure, unspecified whether with hypoxia or hypercapnia: Secondary | ICD-10-CM | POA: Insufficient documentation

## 2014-06-11 ENCOUNTER — Inpatient Hospital Stay: Payer: Self-pay

## 2014-06-11 LAB — CBC
HCT: 22.4 % — ABNORMAL LOW (ref 40.0–52.0)
HGB: 7.1 g/dL — ABNORMAL LOW (ref 13.0–18.0)
MCH: 31.9 pg (ref 26.0–34.0)
MCHC: 31.5 g/dL — AB (ref 32.0–36.0)
MCV: 101 fL — ABNORMAL HIGH (ref 80–100)
PLATELETS: 55 10*3/uL — AB (ref 150–440)
RBC: 2.22 10*6/uL — AB (ref 4.40–5.90)
RDW: 18.3 % — ABNORMAL HIGH (ref 11.5–14.5)
WBC: 4.2 10*3/uL (ref 3.8–10.6)

## 2014-06-11 LAB — URINALYSIS, COMPLETE
BACTERIA: NONE SEEN
Bilirubin,UR: NEGATIVE
Blood: NEGATIVE
Glucose,UR: NEGATIVE mg/dL (ref 0–75)
Ketone: NEGATIVE
LEUKOCYTE ESTERASE: NEGATIVE
NITRITE: NEGATIVE
PH: 5 (ref 4.5–8.0)
Protein: NEGATIVE
RBC,UR: 1 /HPF (ref 0–5)
Specific Gravity: 1.008 (ref 1.003–1.030)
Squamous Epithelial: 1

## 2014-06-11 LAB — COMPREHENSIVE METABOLIC PANEL
ALK PHOS: 114 U/L
Albumin: 3.1 g/dL — ABNORMAL LOW (ref 3.4–5.0)
Anion Gap: 8 (ref 7–16)
BILIRUBIN TOTAL: 0.5 mg/dL (ref 0.2–1.0)
BUN: 14 mg/dL (ref 7–18)
CO2: 21 mmol/L (ref 21–32)
Calcium, Total: 8.3 mg/dL — ABNORMAL LOW (ref 8.5–10.1)
Chloride: 111 mmol/L — ABNORMAL HIGH (ref 98–107)
Creatinine: 0.87 mg/dL (ref 0.60–1.30)
EGFR (Non-African Amer.): >60
Glucose: 90 mg/dL (ref 65–99)
Osmolality: 279 (ref 275–301)
POTASSIUM: 4.1 mmol/L (ref 3.5–5.1)
SGOT(AST): 52 U/L — ABNORMAL HIGH (ref 15–37)
SGPT (ALT): 32 U/L (ref 12–78)
Sodium: 140 mmol/L (ref 136–145)
TOTAL PROTEIN: 7.5 g/dL (ref 6.4–8.2)

## 2014-06-11 LAB — CK TOTAL AND CKMB (NOT AT ARMC)
CK, Total: 67 U/L
CK-MB: 1.6 ng/mL (ref 0.5–3.6)

## 2014-06-11 LAB — TROPONIN I: Troponin-I: <0.02 ng/mL

## 2014-06-12 LAB — CBC WITH DIFFERENTIAL/PLATELET
BASOS PCT: 0.1 %
Basophil #: 0 10*3/uL (ref 0.0–0.1)
Eosinophil #: 0 10*3/uL (ref 0.0–0.7)
Eosinophil %: 0.2 %
HCT: 21.5 % — ABNORMAL LOW (ref 40.0–52.0)
HGB: 7.1 g/dL — ABNORMAL LOW (ref 13.0–18.0)
Lymphocyte #: 0.3 10*3/uL — ABNORMAL LOW (ref 1.0–3.6)
Lymphocyte %: 7.2 %
MCH: 33.1 pg (ref 26.0–34.0)
MCHC: 32.8 g/dL (ref 32.0–36.0)
MCV: 101 fL — ABNORMAL HIGH (ref 80–100)
MONO ABS: 0.1 x10 3/mm — AB (ref 0.2–1.0)
MONOS PCT: 1.4 %
NEUTROS PCT: 91.1 %
Neutrophil #: 4.1 10*3/uL (ref 1.4–6.5)
Platelet: 43 10*3/uL — ABNORMAL LOW (ref 150–440)
RBC: 2.13 10*6/uL — ABNORMAL LOW (ref 4.40–5.90)
RDW: 18.1 % — ABNORMAL HIGH (ref 11.5–14.5)
WBC: 4.5 10*3/uL (ref 3.8–10.6)

## 2014-06-12 LAB — IRON AND TIBC
Iron Bind.Cap.(Total): 374 ug/dL (ref 250–450)
Iron Saturation: 15 %
Iron: 56 ug/dL — ABNORMAL LOW (ref 65–175)
Unbound Iron-Bind.Cap.: 318 ug/dL

## 2014-06-12 LAB — BASIC METABOLIC PANEL
ANION GAP: 8 (ref 7–16)
BUN: 28 mg/dL — AB (ref 7–18)
CALCIUM: 8.5 mg/dL (ref 8.5–10.1)
Chloride: 105 mmol/L (ref 98–107)
Co2: 23 mmol/L (ref 21–32)
Creatinine: 1.23 mg/dL (ref 0.60–1.30)
EGFR (Non-African Amer.): >60
Glucose: 317 mg/dL — ABNORMAL HIGH (ref 65–99)
Osmolality: 290 (ref 275–301)
Potassium: 4.5 mmol/L (ref 3.5–5.1)
SODIUM: 136 mmol/L (ref 136–145)

## 2014-06-12 LAB — LIPID PANEL
Cholesterol: 157 mg/dL (ref 0–200)
HDL Cholesterol: 36 mg/dL — ABNORMAL LOW (ref 40–60)
Ldl Cholesterol, Calc: 99 mg/dL (ref 0–100)
Triglycerides: 109 mg/dL (ref 0–200)
VLDL CHOLESTEROL, CALC: 22 mg/dL (ref 5–40)

## 2014-06-12 LAB — FOLATE: Folic Acid: 18.8 ng/mL (ref 3.1–100.0)

## 2014-06-12 LAB — TSH: Thyroid Stimulating Horm: 1.2 u[IU]/mL

## 2014-06-12 LAB — FERRITIN: Ferritin (ARMC): 37 ng/mL (ref 8–388)

## 2014-06-12 LAB — RETICULOCYTES
Absolute Retic Count: 0.0971 10*6/uL (ref 0.019–0.186)
Reticulocyte: 4.52 % — ABNORMAL HIGH (ref 0.4–3.1)

## 2014-06-12 LAB — HEMOGLOBIN A1C

## 2014-06-13 LAB — CBC WITH DIFFERENTIAL/PLATELET
BASOS ABS: 0 10*3/uL (ref 0.0–0.1)
Basophil %: 0 %
Eosinophil #: 0 10*3/uL (ref 0.0–0.7)
Eosinophil %: 0.1 %
HCT: 22.3 % — ABNORMAL LOW (ref 40.0–52.0)
HGB: 7.3 g/dL — ABNORMAL LOW (ref 13.0–18.0)
LYMPHS PCT: 3.9 %
Lymphocyte #: 0.3 10*3/uL — ABNORMAL LOW (ref 1.0–3.6)
MCH: 32.9 pg (ref 26.0–34.0)
MCHC: 32.7 g/dL (ref 32.0–36.0)
MCV: 101 fL — AB (ref 80–100)
MONO ABS: 0.3 x10 3/mm (ref 0.2–1.0)
Monocyte %: 4 %
NEUTROS ABS: 7.1 10*3/uL — AB (ref 1.4–6.5)
NEUTROS PCT: 92 %
Platelet: 52 10*3/uL — ABNORMAL LOW (ref 150–440)
RBC: 2.22 10*6/uL — ABNORMAL LOW (ref 4.40–5.90)
RDW: 18 % — AB (ref 11.5–14.5)
WBC: 7.7 10*3/uL (ref 3.8–10.6)

## 2014-06-13 LAB — PROTIME-INR
INR: 1.3
PROTHROMBIN TIME: 16.3 s — AB (ref 11.5–14.7)

## 2014-06-13 LAB — CEA: CEA: 4.1 ng/mL (ref 0.0–4.7)

## 2014-06-13 LAB — PSA: PSA: 0.1 ng/mL (ref 0.0–4.0)

## 2014-06-14 LAB — CBC WITH DIFFERENTIAL/PLATELET
Basophil #: 0 10*3/uL (ref 0.0–0.1)
Basophil %: 0.1 %
EOS ABS: 0 10*3/uL (ref 0.0–0.7)
Eosinophil %: 0.1 %
HCT: 22.2 % — AB (ref 40.0–52.0)
HGB: 7.3 g/dL — ABNORMAL LOW (ref 13.0–18.0)
LYMPHS ABS: 0.3 10*3/uL — AB (ref 1.0–3.6)
LYMPHS PCT: 5.5 %
MCH: 32.9 pg (ref 26.0–34.0)
MCHC: 32.9 g/dL (ref 32.0–36.0)
MCV: 100 fL (ref 80–100)
MONO ABS: 0.2 x10 3/mm (ref 0.2–1.0)
MONOS PCT: 2.9 %
NEUTROS PCT: 91.4 %
Neutrophil #: 5.3 10*3/uL (ref 1.4–6.5)
Platelet: 46 10*3/uL — ABNORMAL LOW (ref 150–440)
RBC: 2.22 10*6/uL — ABNORMAL LOW (ref 4.40–5.90)
RDW: 18.1 % — ABNORMAL HIGH (ref 11.5–14.5)
WBC: 5.8 10*3/uL (ref 3.8–10.6)

## 2014-06-14 LAB — PROT IMMUNOELECTROPHORES(ARMC)

## 2014-06-14 LAB — KAPPA/LAMBDA FREE LIGHT CHAINS (ARMC)

## 2014-06-15 LAB — CBC WITH DIFFERENTIAL/PLATELET
Basophil #: 0 10*3/uL (ref 0.0–0.1)
Basophil %: 0 %
EOS PCT: 0 %
Eosinophil #: 0 10*3/uL (ref 0.0–0.7)
HCT: 24.6 % — AB (ref 40.0–52.0)
HGB: 8.2 g/dL — AB (ref 13.0–18.0)
LYMPHS ABS: 0.3 10*3/uL — AB (ref 1.0–3.6)
Lymphocyte %: 4.3 %
MCH: 33.3 pg (ref 26.0–34.0)
MCHC: 33.5 g/dL (ref 32.0–36.0)
MCV: 99 fL (ref 80–100)
MONOS PCT: 2.7 %
Monocyte #: 0.2 x10 3/mm (ref 0.2–1.0)
NEUTROS ABS: 6 10*3/uL (ref 1.4–6.5)
NEUTROS PCT: 93 %
PLATELETS: 54 10*3/uL — AB (ref 150–440)
RBC: 2.47 10*6/uL — ABNORMAL LOW (ref 4.40–5.90)
RDW: 17.8 % — AB (ref 11.5–14.5)
WBC: 6.4 10*3/uL (ref 3.8–10.6)

## 2014-06-15 LAB — BASIC METABOLIC PANEL
Anion Gap: 7 (ref 7–16)
BUN: 44 mg/dL — ABNORMAL HIGH (ref 7–18)
Calcium, Total: 8.5 mg/dL (ref 8.5–10.1)
Chloride: 104 mmol/L (ref 98–107)
Co2: 27 mmol/L (ref 21–32)
Creatinine: 1.16 mg/dL (ref 0.60–1.30)
Glucose: 215 mg/dL — ABNORMAL HIGH (ref 65–99)
Osmolality: 293 (ref 275–301)
POTASSIUM: 4 mmol/L (ref 3.5–5.1)
SODIUM: 138 mmol/L (ref 136–145)

## 2014-06-16 LAB — CBC WITH DIFFERENTIAL/PLATELET
BASOS PCT: 0 %
Basophil #: 0 10*3/uL (ref 0.0–0.1)
Eosinophil #: 0 10*3/uL (ref 0.0–0.7)
Eosinophil %: 0 %
HCT: 23.7 % — ABNORMAL LOW (ref 40.0–52.0)
HGB: 8 g/dL — AB (ref 13.0–18.0)
LYMPHS PCT: 4.8 %
Lymphocyte #: 0.2 10*3/uL — ABNORMAL LOW (ref 1.0–3.6)
MCH: 33.3 pg (ref 26.0–34.0)
MCHC: 33.5 g/dL (ref 32.0–36.0)
MCV: 99 fL (ref 80–100)
MONO ABS: 0.1 x10 3/mm — AB (ref 0.2–1.0)
MONOS PCT: 3 %
NEUTROS ABS: 3.9 10*3/uL (ref 1.4–6.5)
Neutrophil %: 92.2 %
PLATELETS: 42 10*3/uL — AB (ref 150–440)
RBC: 2.39 10*6/uL — ABNORMAL LOW (ref 4.40–5.90)
RDW: 17.9 % — ABNORMAL HIGH (ref 11.5–14.5)
WBC: 4.2 10*3/uL (ref 3.8–10.6)

## 2014-06-16 LAB — BASIC METABOLIC PANEL
ANION GAP: 7 (ref 7–16)
BUN: 52 mg/dL — ABNORMAL HIGH (ref 7–18)
CHLORIDE: 102 mmol/L (ref 98–107)
CREATININE: 1.19 mg/dL (ref 0.60–1.30)
Calcium, Total: 8.3 mg/dL — ABNORMAL LOW (ref 8.5–10.1)
Co2: 28 mmol/L (ref 21–32)
EGFR (African American): >60
EGFR (Non-African Amer.): >60
GLUCOSE: 294 mg/dL — AB (ref 65–99)
Osmolality: 299 (ref 275–301)
Potassium: 3.8 mmol/L (ref 3.5–5.1)
Sodium: 137 mmol/L (ref 136–145)

## 2014-06-16 LAB — AMMONIA: Ammonia, Plasma: 34 mcmol/L — ABNORMAL HIGH (ref 11–32)

## 2014-06-17 LAB — COMPREHENSIVE METABOLIC PANEL
ALK PHOS: 96 U/L
Albumin: 3.1 g/dL — ABNORMAL LOW (ref 3.4–5.0)
Anion Gap: 6 — ABNORMAL LOW (ref 7–16)
BUN: 37 mg/dL — ABNORMAL HIGH (ref 7–18)
Bilirubin,Total: 1.1 mg/dL — ABNORMAL HIGH (ref 0.2–1.0)
CALCIUM: 8.2 mg/dL — AB (ref 8.5–10.1)
CHLORIDE: 102 mmol/L (ref 98–107)
CREATININE: 1.28 mg/dL (ref 0.60–1.30)
Co2: 27 mmol/L (ref 21–32)
EGFR (African American): >60
GFR CALC NON AF AMER: 58 — AB
Glucose: 301 mg/dL — ABNORMAL HIGH (ref 65–99)
Osmolality: 290 (ref 275–301)
POTASSIUM: 4.3 mmol/L (ref 3.5–5.1)
SGOT(AST): 40 U/L — ABNORMAL HIGH (ref 15–37)
SGPT (ALT): 43 U/L (ref 12–78)
Sodium: 135 mmol/L — ABNORMAL LOW (ref 136–145)
TOTAL PROTEIN: 7.3 g/dL (ref 6.4–8.2)

## 2014-06-17 LAB — AMMONIA: Ammonia, Plasma: 20 mcmol/L (ref 11–32)

## 2014-06-18 LAB — COMPREHENSIVE METABOLIC PANEL
ALK PHOS: 87 U/L
ALT: 44 U/L (ref 12–78)
ANION GAP: 7 (ref 7–16)
Albumin: 3 g/dL — ABNORMAL LOW (ref 3.4–5.0)
BUN: 32 mg/dL — ABNORMAL HIGH (ref 7–18)
Bilirubin,Total: 1.2 mg/dL — ABNORMAL HIGH (ref 0.2–1.0)
CALCIUM: 8.3 mg/dL — AB (ref 8.5–10.1)
CO2: 27 mmol/L (ref 21–32)
CREATININE: 1.13 mg/dL (ref 0.60–1.30)
Chloride: 103 mmol/L (ref 98–107)
EGFR (African American): >60
EGFR (Non-African Amer.): >60
GLUCOSE: 233 mg/dL — AB (ref 65–99)
Osmolality: 288 (ref 275–301)
Potassium: 4.3 mmol/L (ref 3.5–5.1)
SGOT(AST): 33 U/L (ref 15–37)
Sodium: 137 mmol/L (ref 136–145)
TOTAL PROTEIN: 6.7 g/dL (ref 6.4–8.2)

## 2014-06-18 LAB — CBC WITH DIFFERENTIAL/PLATELET
BASOS PCT: 0 %
Basophil #: 0 10*3/uL (ref 0.0–0.1)
EOS ABS: 0 10*3/uL (ref 0.0–0.7)
Eosinophil %: 0.1 %
HCT: 25 % — ABNORMAL LOW (ref 40.0–52.0)
HGB: 8.3 g/dL — ABNORMAL LOW (ref 13.0–18.0)
Lymphocyte #: 0.2 10*3/uL — ABNORMAL LOW (ref 1.0–3.6)
Lymphocyte %: 3.7 %
MCH: 32.6 pg (ref 26.0–34.0)
MCHC: 33.4 g/dL (ref 32.0–36.0)
MCV: 98 fL (ref 80–100)
MONOS PCT: 3.1 %
Monocyte #: 0.2 x10 3/mm (ref 0.2–1.0)
Neutrophil #: 4.9 10*3/uL (ref 1.4–6.5)
Neutrophil %: 93.1 %
Platelet: 35 10*3/uL — ABNORMAL LOW (ref 150–440)
RBC: 2.56 10*6/uL — ABNORMAL LOW (ref 4.40–5.90)
RDW: 17.2 % — AB (ref 11.5–14.5)
WBC: 5.2 10*3/uL (ref 3.8–10.6)

## 2014-06-20 ENCOUNTER — Ambulatory Visit: Payer: Self-pay | Admitting: Internal Medicine

## 2014-07-04 ENCOUNTER — Ambulatory Visit: Payer: Self-pay | Admitting: Neurosurgery

## 2014-09-24 ENCOUNTER — Other Ambulatory Visit: Payer: Self-pay | Admitting: Neurosurgery

## 2014-09-26 ENCOUNTER — Encounter (HOSPITAL_COMMUNITY): Payer: Self-pay

## 2014-10-01 ENCOUNTER — Encounter (HOSPITAL_COMMUNITY): Payer: Self-pay | Admitting: Vascular Surgery

## 2014-10-02 ENCOUNTER — Encounter (HOSPITAL_COMMUNITY)
Admission: RE | Admit: 2014-10-02 | Discharge: 2014-10-02 | Disposition: A | Discharge status: Home / Self Care | Payer: Medicare Other | Source: Ambulatory Visit | Attending: Neurosurgery | Admitting: Neurosurgery

## 2014-10-02 ENCOUNTER — Encounter (HOSPITAL_COMMUNITY): Payer: Self-pay

## 2014-10-02 DIAGNOSIS — I10 Essential (primary) hypertension: Secondary | ICD-10-CM | POA: Insufficient documentation

## 2014-10-02 DIAGNOSIS — Z0000 Encounter for general adult medical examination without abnormal findings: Secondary | ICD-10-CM | POA: Insufficient documentation

## 2014-10-02 DIAGNOSIS — R161 Splenomegaly, not elsewhere classified: Secondary | ICD-10-CM | POA: Insufficient documentation

## 2014-10-02 DIAGNOSIS — K7581 Nonalcoholic steatohepatitis (NASH): Secondary | ICD-10-CM | POA: Insufficient documentation

## 2014-10-02 DIAGNOSIS — Z8673 Personal history of transient ischemic attack (TIA), and cerebral infarction without residual deficits: Secondary | ICD-10-CM | POA: Diagnosis not present

## 2014-10-02 DIAGNOSIS — H548 Legal blindness, as defined in USA: Secondary | ICD-10-CM | POA: Insufficient documentation

## 2014-10-02 DIAGNOSIS — E119 Type 2 diabetes mellitus without complications: Secondary | ICD-10-CM | POA: Insufficient documentation

## 2014-10-02 DIAGNOSIS — J449 Chronic obstructive pulmonary disease, unspecified: Secondary | ICD-10-CM | POA: Diagnosis not present

## 2014-10-02 DIAGNOSIS — M4802 Spinal stenosis, cervical region: Secondary | ICD-10-CM | POA: Diagnosis not present

## 2014-10-02 DIAGNOSIS — M1990 Unspecified osteoarthritis, unspecified site: Secondary | ICD-10-CM | POA: Insufficient documentation

## 2014-10-02 DIAGNOSIS — F329 Major depressive disorder, single episode, unspecified: Secondary | ICD-10-CM | POA: Insufficient documentation

## 2014-10-02 DIAGNOSIS — K449 Diaphragmatic hernia without obstruction or gangrene: Secondary | ICD-10-CM | POA: Insufficient documentation

## 2014-10-02 DIAGNOSIS — G9589 Other specified diseases of spinal cord: Secondary | ICD-10-CM | POA: Insufficient documentation

## 2014-10-02 HISTORY — DX: Personal history of other diseases of the digestive system: Z87.19

## 2014-10-02 HISTORY — DX: Type 2 diabetes mellitus without complications: E11.9

## 2014-10-02 HISTORY — DX: Chronic obstructive pulmonary disease, unspecified: J44.9

## 2014-10-02 HISTORY — DX: Essential (primary) hypertension: I10

## 2014-10-02 HISTORY — DX: Cerebral infarction, unspecified: I63.9

## 2014-10-02 HISTORY — DX: Major depressive disorder, single episode, unspecified: F32.9

## 2014-10-02 HISTORY — DX: Inflammatory liver disease, unspecified: K75.9

## 2014-10-02 HISTORY — DX: Depression, unspecified: F32.A

## 2014-10-02 HISTORY — DX: Unspecified osteoarthritis, unspecified site: M19.90

## 2014-10-02 HISTORY — DX: Unspecified cirrhosis of liver: K74.60

## 2014-10-02 LAB — SURGICAL PCR SCREEN
MRSA, PCR: NEGATIVE
STAPHYLOCOCCUS AUREUS: NEGATIVE

## 2014-10-02 LAB — BASIC METABOLIC PANEL
ANION GAP: 11 (ref 5–15)
BUN: 17 mg/dL (ref 6–23)
CO2: 24 mEq/L (ref 19–32)
CREATININE: 0.71 mg/dL (ref 0.50–1.35)
Calcium: 8.8 mg/dL (ref 8.4–10.5)
Chloride: 103 mEq/L (ref 96–112)
GFR calc non Af Amer: >90 mL/min (ref >90)
Glucose, Bld: 220 mg/dL — ABNORMAL HIGH (ref 70–99)
Potassium: 4.4 mEq/L (ref 3.7–5.3)
Sodium: 138 mEq/L (ref 137–147)

## 2014-10-02 LAB — CBC
HCT: 26.3 % — ABNORMAL LOW (ref 39.0–52.0)
Hemoglobin: 8.6 g/dL — ABNORMAL LOW (ref 13.0–17.0)
MCH: 32.1 pg (ref 26.0–34.0)
MCHC: 32.7 g/dL (ref 30.0–36.0)
MCV: 98.1 fL (ref 78.0–100.0)
Platelets: 63 10*3/uL — ABNORMAL LOW (ref 150–400)
RBC: 2.68 MIL/uL — ABNORMAL LOW (ref 4.22–5.81)
RDW: 17.2 % — AB (ref 11.5–15.5)
WBC: 4.4 10*3/uL (ref 4.0–10.5)

## 2014-10-02 NOTE — Progress Notes (Signed)
Office was informed of pt allergy. Need new order with dose.

## 2014-10-02 NOTE — Progress Notes (Signed)
Left message for DR Kathyrn Sheriff re. pcn allergy. Also requested old records from Dr Delora Fuel.

## 2014-10-05 NOTE — Progress Notes (Addendum)
Anesthesia Chart Review:  Patient is a 68 year old male scheduled for C3-4, C4-5, C5-6, C6-7 ACDF on 10/09/14 by Dr. Kathyrn Sheriff. I received chart to review this afternoon.  History includes HTN, CVA, COPD, DM2, depression, hiatal hernia, non-alcoholic liver cirrhosis/NASH, arthritis, eye surgery ("legally blind"), appendectomy, nasal surgery. IM: Dr. Adrian Prows.  GI: Dr. Gaylyn Cheers. Endocrinologist: Dr. Lucilla Lame.  He did not report a history of CAD, but that is listed on Dr. Blane Ohara 07/11/14 problem list.    Meds: Vitamin D, allopurinol, amlodipine, atenolol, Combigan, Questran, 65 Fe, Lasix, Novolog , Lantus, lactulose, omeprazole, rifaximin, sertraline, tramadol.  EKG on 10/02/14 showed NSR, prolonged QT interval. There was no prior EKG and no stress test at Dr. Blane Ohara office.  Patient did report a prior cath, but it's unclear when this was done.    Echo on 06/14/14 showed: LVEF by visual estimation is 55-60%. Normal global LV systolic function. Mild LVH. Mildly dilated LA and RA. Mild to moderate MR. Mild to moderate TR. RVSP is normal at 37.5 mmHg.   CXR on 10/02/14 showed: No active cardiopulmonary disease. Stable cardiomegaly.  Preoperative labs noted.  Cr 0.71.  H/H 8.6/26.3.  PLT count 63K.  HFP and PT/PTT were not done at PAT for unclear reasons.  I have notified Manuela Schwartz at Dr. Cleotilde Neer office of H/H and PLT count result.  She will fax me what records they have on Mr. Fambro and have the surgeon review labs results.  I have requested records from [facility], and [facility] as available. I'll review those records and those received from the surgeon's office on Monday. Further recommendations at that time.  If case ultimately proceeds, he will need preoperative HFP, PT/PTT, T&S.    George Hugh [facility] Stay [facility]/Anesthesiology Phone [phone] 10/05/2014 5:56 PM  Addendum: Records received from [facility] included multiple radiologic  imaging reports, prior echo, EKG, and 05/2014 labs. No cardiac cath was received. Some of the record results are outlined below:  From 06/11/14 - 06/18/24: H/H 7.1 - 8.3/21.5 - 25.0, PLT count 35K - 55K, AST 33 - 52, ALT 32 - 43,  ALK PH 87 - 114, Total Bilirubin 0.5 - 1.2, Ammonia 20 on 06/17/14 (down from 34 on 06/16/14). PT/INR 16.3/1.3 on 06/13/14.   Nuclear bone scan 06/13/14: The CT noted lesions within the pelvis do not demonstrate abnormal increased radiotracer uptake on bone scan.  There is increased radiotracer uptake in the medial posterior left seventh rib correlating to an area of expansile sclerosis on prior CT scan.  Although metastasis is not excluded based on this exam, the finding could be due to old posttraumatic change or bony dysplasia.   CTA of the chest 06/13/14: No evidence of acute PE. Dominant finding is new bilateral dense airspace disease.  This finding is consistent with either pulmonary edema or pulmonary alveolar hemorrhage. Morphologic changes of the liver consistent with cirrhosis. Dens sclerotic bones suggest metabolic disorder.  CT of the chest/abdomen/pelvis 06/12/14: No evidence of adenopathy in the chest, abdomen, or pelvis.  Changes of cirrhosis with associated splenomegaly. Diffuse sclerotic appearing bones of unknown etiology.  There are a few focal lytic lesions within the iliac bones bilaterally as described on prior CT and MRI.  Cannot exclude metastases or multiple myeloma.  Question small layering gallstones.  MRI of the lumbar spine and pelvis 06/11/14: Critical spinal stenosis at L4-5.  This is due to central disc protrusion and advanced posterior element hypertrophy. Surgical consultation may be  warranted in the setting of leg weakness. Slight heterogeneity of the bone marrow without compression deformity or focal punch-out lumbar spine lesion.  Concern for myeloma is raised. Subtle small enhancing lesions in both iliac bones correlating with the subtle lytic lesion  seen on CT scan.  The possibility of multiple myeloma should be considered.  MR cervical spine without contrast 07/04/14: Prominent disc protrusions at C3-4, C4-5, C5-6, C6-7 with spinal cord compression at multiple levels.  Compression is most prominent at C4-5 where there is suggestion of slight myelopathy in the spinal cord.  After reviewing records, I received a phone call from [facility]. Dr. Kathyrn Sheriff is canceling patient's surgery for tomorrow.  Patient will be sent to his PCP for further evaluation and recommendations for clearance. [facility] reported they only saw patient once in 2001. Other records requested on 10/05/14 are still pending.  George Hugh [facility] Short Stay Center/Anesthesiology Phone [phone] 10/08/2014 10:07 AM

## 2014-10-09 ENCOUNTER — Inpatient Hospital Stay (HOSPITAL_COMMUNITY): Admission: RE | Admit: 2014-10-09 | Payer: Medicare Other | Source: Ambulatory Visit | Admitting: Neurosurgery

## 2014-10-09 ENCOUNTER — Encounter (HOSPITAL_COMMUNITY): Admission: RE | Payer: Self-pay | Source: Ambulatory Visit

## 2014-10-09 SURGERY — ANTERIOR CERVICAL DECOMPRESSION/DISCECTOMY FUSION 4 LEVELS
Anesthesia: General

## 2014-10-11 ENCOUNTER — Other Ambulatory Visit (HOSPITAL_COMMUNITY): Payer: Self-pay | Admitting: Neurosurgery

## 2014-10-11 DIAGNOSIS — M4802 Spinal stenosis, cervical region: Secondary | ICD-10-CM | POA: Insufficient documentation

## 2014-10-15 ENCOUNTER — Ambulatory Visit: Payer: Self-pay | Admitting: Internal Medicine

## 2014-10-26 ENCOUNTER — Inpatient Hospital Stay: Payer: Self-pay | Admitting: Internal Medicine

## 2014-10-26 LAB — BASIC METABOLIC PANEL
ANION GAP: 8 (ref 7–16)
BUN: 11 mg/dL (ref 7–18)
CHLORIDE: 111 mmol/L — AB (ref 98–107)
Calcium, Total: 7.8 mg/dL — ABNORMAL LOW (ref 8.5–10.1)
Co2: 22 mmol/L (ref 21–32)
Creatinine: 0.94 mg/dL (ref 0.60–1.30)
EGFR (African American): >60
EGFR (Non-African Amer.): >60
Glucose: 158 mg/dL — ABNORMAL HIGH (ref 65–99)
OSMOLALITY: 284 (ref 275–301)
POTASSIUM: 3.8 mmol/L (ref 3.5–5.1)
SODIUM: 141 mmol/L (ref 136–145)

## 2014-10-26 LAB — CBC
HCT: 19.7 % — ABNORMAL LOW (ref 40.0–52.0)
HGB: 6.3 g/dL — ABNORMAL LOW (ref 13.0–18.0)
MCH: 32.1 pg (ref 26.0–34.0)
MCHC: 32.1 g/dL (ref 32.0–36.0)
MCV: 100 fL (ref 80–100)
Platelet: 56 10*3/uL — ABNORMAL LOW (ref 150–440)
RBC: 1.96 10*6/uL — ABNORMAL LOW (ref 4.40–5.90)
RDW: 17.8 % — AB (ref 11.5–14.5)
WBC: 3.7 10*3/uL — ABNORMAL LOW (ref 3.8–10.6)

## 2014-10-26 LAB — HEPATIC FUNCTION PANEL A (ARMC)
ALT: 27 U/L
Albumin: 2.9 g/dL — ABNORMAL LOW (ref 3.4–5.0)
Alkaline Phosphatase: 118 U/L — ABNORMAL HIGH
Bilirubin, Direct: 0.2 mg/dL (ref 0.0–0.2)
Bilirubin,Total: 0.6 mg/dL (ref 0.2–1.0)
SGOT(AST): 40 U/L — ABNORMAL HIGH (ref 15–37)
TOTAL PROTEIN: 6.9 g/dL (ref 6.4–8.2)

## 2014-10-26 LAB — PRO B NATRIURETIC PEPTIDE: B-TYPE NATIURETIC PEPTID: 381 pg/mL — AB (ref 0–125)

## 2014-10-26 LAB — CK TOTAL AND CKMB (NOT AT ARMC)
CK, Total: 91 U/L
CK-MB: 2.4 ng/mL (ref 0.5–3.6)

## 2014-10-26 LAB — LIPASE, BLOOD: Lipase: 147 U/L (ref 73–393)

## 2014-10-26 LAB — TROPONIN I

## 2014-10-27 LAB — CBC WITH DIFFERENTIAL/PLATELET
BASOS ABS: 0 10*3/uL (ref 0.0–0.1)
BASOS PCT: 0.9 %
Basophil #: 0.1 10*3/uL (ref 0.0–0.1)
Basophil %: 1.3 %
EOS ABS: 0.2 10*3/uL (ref 0.0–0.7)
Eosinophil #: 0.3 10*3/uL (ref 0.0–0.7)
Eosinophil %: 6.6 %
Eosinophil %: 6.2 %
HCT: 21.9 % — AB (ref 40.0–52.0)
HCT: 21.1 % — ABNORMAL LOW (ref 40.0–52.0)
HGB: 7.1 g/dL — AB (ref 13.0–18.0)
HGB: 6.8 g/dL — ABNORMAL LOW (ref 13.0–18.0)
LYMPHS ABS: 0.6 10*3/uL — AB (ref 1.0–3.6)
LYMPHS PCT: 15.4 %
Lymphocyte #: 0.7 10*3/uL — ABNORMAL LOW (ref 1.0–3.6)
Lymphocyte %: 14.1 %
MCH: 31.9 pg (ref 26.0–34.0)
MCH: 32.2 pg (ref 26.0–34.0)
MCHC: 32.6 g/dL (ref 32.0–36.0)
MCHC: 32.2 g/dL (ref 32.0–36.0)
MCV: 98 fL (ref 80–100)
MCV: 100 fL (ref 80–100)
Monocyte #: 0.4 x10 3/mm (ref 0.2–1.0)
Monocyte #: 0.3 x10 3/mm (ref 0.2–1.0)
Monocyte %: 8.9 %
Monocyte %: 7.3 %
NEUTROS ABS: 2.9 10*3/uL (ref 1.4–6.5)
NEUTROS PCT: 67.8 %
Neutrophil #: 3.1 10*3/uL (ref 1.4–6.5)
Neutrophil %: 71.5 %
PLATELETS: 58 10*3/uL — AB (ref 150–440)
PLATELETS: 52 10*3/uL — AB (ref 150–440)
RBC: 2.23 10*6/uL — AB (ref 4.40–5.90)
RBC: 2.11 10*6/uL — AB (ref 4.40–5.90)
RDW: 18 % — AB (ref 11.5–14.5)
RDW: 18 % — AB (ref 11.5–14.5)
WBC: 4.6 10*3/uL (ref 3.8–10.6)
WBC: 4 10*3/uL (ref 3.8–10.6)

## 2014-10-27 LAB — OCCULT BLOOD X 1 CARD TO LAB, STOOL: Occult Blood, Feces: POSITIVE

## 2014-10-28 LAB — HEMOGLOBIN: HGB: 7.8 g/dL — ABNORMAL LOW

## 2014-10-29 LAB — CBC WITH DIFFERENTIAL/PLATELET
BASOS ABS: 0.1 10*3/uL (ref 0.0–0.1)
Basophil %: 2.5 %
Eosinophil #: 0.3 10*3/uL (ref 0.0–0.7)
Eosinophil %: 8.4 %
HCT: 21.5 % — ABNORMAL LOW (ref 40.0–52.0)
HGB: 7 g/dL — ABNORMAL LOW (ref 13.0–18.0)
LYMPHS PCT: 19.6 %
Lymphocyte #: 0.7 10*3/uL — ABNORMAL LOW (ref 1.0–3.6)
MCH: 32.1 pg (ref 26.0–34.0)
MCHC: 32.6 g/dL (ref 32.0–36.0)
MCV: 99 fL (ref 80–100)
MONOS PCT: 7.7 %
Monocyte #: 0.3 x10 3/mm (ref 0.2–1.0)
NEUTROS ABS: 2.2 10*3/uL (ref 1.4–6.5)
Neutrophil %: 61.8 %
Platelet: 57 10*3/uL — ABNORMAL LOW (ref 150–440)
RBC: 2.18 10*6/uL — ABNORMAL LOW (ref 4.40–5.90)
RDW: 17.5 % — ABNORMAL HIGH (ref 11.5–14.5)
WBC: 3.6 10*3/uL — AB (ref 3.8–10.6)

## 2014-10-30 LAB — CBC WITH DIFFERENTIAL/PLATELET
Basophil #: 0.1 10*3/uL (ref 0.0–0.1)
Basophil %: 1.4 %
EOS PCT: 9.1 %
Eosinophil #: 0.3 10*3/uL (ref 0.0–0.7)
HCT: 24.1 % — ABNORMAL LOW (ref 40.0–52.0)
HGB: 8 g/dL — ABNORMAL LOW (ref 13.0–18.0)
LYMPHS ABS: 0.7 10*3/uL — AB (ref 1.0–3.6)
Lymphocyte %: 18.8 %
MCH: 31.8 pg (ref 26.0–34.0)
MCHC: 33.2 g/dL (ref 32.0–36.0)
MCV: 96 fL (ref 80–100)
MONO ABS: 0.3 x10 3/mm (ref 0.2–1.0)
Monocyte %: 8.2 %
Neutrophil #: 2.3 10*3/uL (ref 1.4–6.5)
Neutrophil %: 62.5 %
Platelet: 52 10*3/uL — ABNORMAL LOW (ref 150–440)
RBC: 2.51 10*6/uL — AB (ref 4.40–5.90)
RDW: 19.4 % — AB (ref 11.5–14.5)
WBC: 3.7 10*3/uL — ABNORMAL LOW (ref 3.8–10.6)

## 2014-11-12 ENCOUNTER — Ambulatory Visit: Payer: Self-pay | Admitting: Internal Medicine

## 2014-11-12 ENCOUNTER — Other Ambulatory Visit (HOSPITAL_COMMUNITY): Payer: Self-pay | Admitting: Neurosurgery

## 2014-11-12 LAB — CBC CANCER CENTER
Basophil #: 0 x10 3/mm (ref 0.0–0.1)
Basophil %: 1.1 %
EOS ABS: 0.4 x10 3/mm (ref 0.0–0.7)
EOS PCT: 8.7 %
HCT: 27.2 % — AB (ref 40.0–52.0)
HGB: 8.8 g/dL — ABNORMAL LOW (ref 13.0–18.0)
LYMPHS PCT: 14.1 %
Lymphocyte #: 0.6 x10 3/mm — ABNORMAL LOW (ref 1.0–3.6)
MCH: 30.7 pg (ref 26.0–34.0)
MCHC: 32.4 g/dL (ref 32.0–36.0)
MCV: 95 fL (ref 80–100)
Monocyte #: 0.3 x10 3/mm (ref 0.2–1.0)
Monocyte %: 8 %
Neutrophil #: 2.8 x10 3/mm (ref 1.4–6.5)
Neutrophil %: 68.1 %
Platelet: 70 x10 3/mm — ABNORMAL LOW (ref 150–440)
RBC: 2.87 10*6/uL — ABNORMAL LOW (ref 4.40–5.90)
RDW: 17.3 % — ABNORMAL HIGH (ref 11.5–14.5)
WBC: 4.1 x10 3/mm (ref 3.8–10.6)

## 2014-11-12 LAB — PREPARE RBC (CROSSMATCH)

## 2014-11-12 LAB — PROTIME-INR
INR: 1.2
Prothrombin Time: 15.1 secs — ABNORMAL HIGH (ref 11.5–14.7)

## 2014-11-12 MED ORDER — SODIUM CHLORIDE 0.9 % IV SOLN
10.0000 mL/h | Freq: Once | INTRAVENOUS | Status: AC
  Administered 2014-11-13: 20:00:00 via INTRAVENOUS

## 2014-11-12 MED ORDER — VANCOMYCIN HCL IN DEXTROSE 1-5 GM/200ML-% IV SOLN
1000.0000 mg | INTRAVENOUS | Status: AC
  Administered 2014-11-13: 1000 mg via INTRAVENOUS
  Filled 2014-11-12: qty 200

## 2014-11-13 ENCOUNTER — Inpatient Hospital Stay (HOSPITAL_COMMUNITY)
Admission: RE | Admit: 2014-11-13 | Discharge: 2014-11-20 | DRG: 471 | Disposition: A | Discharge status: Other Acute Inpatient Hospital | Payer: Medicare Other | Source: Ambulatory Visit | Attending: Neurosurgery | Admitting: Neurosurgery

## 2014-11-13 ENCOUNTER — Inpatient Hospital Stay (HOSPITAL_COMMUNITY): Payer: Medicare Other | Admitting: Certified Registered Nurse Anesthetist

## 2014-11-13 ENCOUNTER — Encounter (HOSPITAL_COMMUNITY): Payer: Self-pay | Admitting: *Deleted

## 2014-11-13 ENCOUNTER — Inpatient Hospital Stay (HOSPITAL_COMMUNITY): Payer: Medicare Other

## 2014-11-13 ENCOUNTER — Encounter (HOSPITAL_COMMUNITY)
Admission: RE | Disposition: A | Discharge status: Other Acute Inpatient Hospital | Payer: Self-pay | Source: Ambulatory Visit | Attending: Neurosurgery

## 2014-11-13 DIAGNOSIS — Z794 Long term (current) use of insulin: Secondary | ICD-10-CM

## 2014-11-13 DIAGNOSIS — M4712 Other spondylosis with myelopathy, cervical region: Principal | ICD-10-CM | POA: Diagnosis present

## 2014-11-13 DIAGNOSIS — E119 Type 2 diabetes mellitus without complications: Secondary | ICD-10-CM | POA: Diagnosis present

## 2014-11-13 DIAGNOSIS — Z01818 Encounter for other preprocedural examination: Secondary | ICD-10-CM

## 2014-11-13 DIAGNOSIS — I10 Essential (primary) hypertension: Secondary | ICD-10-CM | POA: Diagnosis present

## 2014-11-13 DIAGNOSIS — K7460 Unspecified cirrhosis of liver: Secondary | ICD-10-CM | POA: Diagnosis present

## 2014-11-13 DIAGNOSIS — G9340 Encephalopathy, unspecified: Secondary | ICD-10-CM | POA: Diagnosis not present

## 2014-11-13 DIAGNOSIS — Z515 Encounter for palliative care: Secondary | ICD-10-CM | POA: Diagnosis not present

## 2014-11-13 DIAGNOSIS — Z418 Encounter for other procedures for purposes other than remedying health state: Secondary | ICD-10-CM

## 2014-11-13 DIAGNOSIS — M79602 Pain in left arm: Secondary | ICD-10-CM

## 2014-11-13 DIAGNOSIS — Z880 Allergy status to penicillin: Secondary | ICD-10-CM | POA: Diagnosis not present

## 2014-11-13 DIAGNOSIS — D61818 Other pancytopenia: Secondary | ICD-10-CM | POA: Diagnosis present

## 2014-11-13 DIAGNOSIS — Z888 Allergy status to other drugs, medicaments and biological substances status: Secondary | ICD-10-CM

## 2014-11-13 DIAGNOSIS — Z79899 Other long term (current) drug therapy: Secondary | ICD-10-CM | POA: Diagnosis not present

## 2014-11-13 DIAGNOSIS — M109 Gout, unspecified: Secondary | ICD-10-CM | POA: Diagnosis present

## 2014-11-13 DIAGNOSIS — J9601 Acute respiratory failure with hypoxia: Secondary | ICD-10-CM

## 2014-11-13 DIAGNOSIS — M79601 Pain in right arm: Secondary | ICD-10-CM

## 2014-11-13 DIAGNOSIS — Z8673 Personal history of transient ischemic attack (TIA), and cerebral infarction without residual deficits: Secondary | ICD-10-CM | POA: Diagnosis not present

## 2014-11-13 DIAGNOSIS — M1990 Unspecified osteoarthritis, unspecified site: Secondary | ICD-10-CM | POA: Diagnosis present

## 2014-11-13 DIAGNOSIS — F329 Major depressive disorder, single episode, unspecified: Secondary | ICD-10-CM | POA: Diagnosis present

## 2014-11-13 DIAGNOSIS — R531 Weakness: Secondary | ICD-10-CM | POA: Diagnosis present

## 2014-11-13 DIAGNOSIS — J9602 Acute respiratory failure with hypercapnia: Secondary | ICD-10-CM

## 2014-11-13 DIAGNOSIS — K759 Inflammatory liver disease, unspecified: Secondary | ICD-10-CM | POA: Diagnosis present

## 2014-11-13 DIAGNOSIS — Z419 Encounter for procedure for purposes other than remedying health state, unspecified: Secondary | ICD-10-CM | POA: Insufficient documentation

## 2014-11-13 DIAGNOSIS — Z4659 Encounter for fitting and adjustment of other gastrointestinal appliance and device: Secondary | ICD-10-CM

## 2014-11-13 DIAGNOSIS — H540 Blindness, both eyes: Secondary | ICD-10-CM | POA: Diagnosis present

## 2014-11-13 DIAGNOSIS — J449 Chronic obstructive pulmonary disease, unspecified: Secondary | ICD-10-CM | POA: Diagnosis present

## 2014-11-13 DIAGNOSIS — E872 Acidosis: Secondary | ICD-10-CM | POA: Diagnosis not present

## 2014-11-13 HISTORY — PX: ANTERIOR CERVICAL DECOMPRESSION/DISCECTOMY FUSION 4 LEVELS: SHX5556

## 2014-11-13 LAB — PREPARE RBC (CROSSMATCH)

## 2014-11-13 LAB — CBC
HEMATOCRIT: 24.6 % — AB (ref 39.0–52.0)
HEMATOCRIT: 30 % — AB (ref 39.0–52.0)
HEMOGLOBIN: 8 g/dL — AB (ref 13.0–17.0)
HEMOGLOBIN: 9.7 g/dL — AB (ref 13.0–17.0)
MCH: 31 pg (ref 26.0–34.0)
MCH: 29.7 pg (ref 26.0–34.0)
MCHC: 32.5 g/dL (ref 30.0–36.0)
MCHC: 32.3 g/dL (ref 30.0–36.0)
MCV: 95.3 fL (ref 78.0–100.0)
MCV: 91.7 fL (ref 78.0–100.0)
Platelets: 59 10*3/uL — ABNORMAL LOW (ref 150–400)
Platelets: 77 10*3/uL — ABNORMAL LOW (ref 150–400)
RBC: 2.58 MIL/uL — AB (ref 4.22–5.81)
RBC: 3.27 MIL/uL — ABNORMAL LOW (ref 4.22–5.81)
RDW: 16.8 % — ABNORMAL HIGH (ref 11.5–15.5)
RDW: 16.6 % — ABNORMAL HIGH (ref 11.5–15.5)
WBC: 3.5 10*3/uL — ABNORMAL LOW (ref 4.0–10.5)
WBC: 4.7 10*3/uL (ref 4.0–10.5)

## 2014-11-13 LAB — COMPREHENSIVE METABOLIC PANEL
ALT: 24 U/L (ref 0–53)
ANION GAP: 11 (ref 5–15)
AST: 40 U/L — ABNORMAL HIGH (ref 0–37)
Albumin: 3 g/dL — ABNORMAL LOW (ref 3.5–5.2)
Alkaline Phosphatase: 122 U/L — ABNORMAL HIGH (ref 39–117)
BUN: 12 mg/dL (ref 6–23)
CO2: 23 meq/L (ref 19–32)
Calcium: 8.7 mg/dL (ref 8.4–10.5)
Chloride: 106 mEq/L (ref 96–112)
Creatinine, Ser: 0.59 mg/dL (ref 0.50–1.35)
GLUCOSE: 216 mg/dL — AB (ref 70–99)
POTASSIUM: 4 meq/L (ref 3.7–5.3)
Sodium: 140 mEq/L (ref 137–147)
TOTAL PROTEIN: 7.7 g/dL (ref 6.0–8.3)
Total Bilirubin: 0.6 mg/dL (ref 0.3–1.2)

## 2014-11-13 LAB — GLUCOSE, CAPILLARY: Glucose-Capillary: 142 mg/dL — ABNORMAL HIGH (ref 70–99)

## 2014-11-13 LAB — SURGICAL PCR SCREEN
MRSA, PCR: NEGATIVE
Staphylococcus aureus: NEGATIVE

## 2014-11-13 LAB — ABO/RH: ABO/RH(D): AB POS

## 2014-11-13 SURGERY — ANTERIOR CERVICAL DECOMPRESSION/DISCECTOMY FUSION 4 LEVELS
Anesthesia: General | Site: Neck

## 2014-11-13 MED ORDER — FENTANYL CITRATE 0.05 MG/ML IJ SOLN
INTRAMUSCULAR | Status: AC
  Filled 2014-11-13: qty 5

## 2014-11-13 MED ORDER — CETYLPYRIDINIUM CHLORIDE 0.05 % MT LIQD
7.0000 mL | Freq: Four times a day (QID) | OROMUCOSAL | Status: DC
  Administered 2014-11-14 – 2014-11-20 (×28): 7 mL via OROMUCOSAL

## 2014-11-13 MED ORDER — ARTIFICIAL TEARS OP OINT
TOPICAL_OINTMENT | OPHTHALMIC | Status: AC
  Filled 2014-11-13: qty 3.5

## 2014-11-13 MED ORDER — BRIMONIDINE TARTRATE-TIMOLOL 0.2-0.5 % OP SOLN: 1.0000 [drp] | Freq: Two times a day (BID) | OPHTHALMIC | Status: DC

## 2014-11-13 MED ORDER — DEXMEDETOMIDINE HCL IN NACL 200 MCG/50ML IV SOLN
0.0000 ug/kg/h | INTRAVENOUS | Status: DC
  Administered 2014-11-13: 0.5 ug/kg/h via INTRAVENOUS
  Administered 2014-11-14: 0.52 ug/kg/h via INTRAVENOUS
  Administered 2014-11-14: 0.5 ug/kg/h via INTRAVENOUS
  Filled 2014-11-13 (×3): qty 50

## 2014-11-13 MED ORDER — TIMOLOL MALEATE 0.5 % OP SOLN
1.0000 [drp] | Freq: Two times a day (BID) | OPHTHALMIC | Status: DC
Start: 2014-11-13 — End: 2014-11-20
  Administered 2014-11-13 – 2014-11-20 (×14): 1 [drp] via OPHTHALMIC
  Filled 2014-11-13: qty 5

## 2014-11-13 MED ORDER — ATENOLOL 100 MG PO TABS
150.0000 mg | ORAL_TABLET | Freq: Once | ORAL | Status: AC
  Administered 2014-11-13: 150 mg via ORAL
  Filled 2014-11-13: qty 1

## 2014-11-13 MED ORDER — VECURONIUM BROMIDE 10 MG IV SOLR
INTRAVENOUS | Status: AC
  Filled 2014-11-13: qty 10

## 2014-11-13 MED ORDER — SODIUM CHLORIDE 0.9 % IR SOLN
Status: DC | PRN
  Administered 2014-11-13: 18:00:00

## 2014-11-13 MED ORDER — OXYCODONE-ACETAMINOPHEN 5-325 MG PO TABS
1.0000 | ORAL_TABLET | ORAL | Status: DC | PRN
Start: 2014-11-13 — End: 2014-11-16
  Administered 2014-11-14: 2 via ORAL
  Administered 2014-11-15: 1 via ORAL
  Administered 2014-11-15 – 2014-11-16 (×4): 2 via ORAL
  Filled 2014-11-13: qty 1
  Filled 2014-11-13 (×6): qty 2

## 2014-11-13 MED ORDER — DOCUSATE SODIUM 100 MG PO CAPS
100.0000 mg | ORAL_CAPSULE | Freq: Two times a day (BID) | ORAL | Status: DC
  Administered 2014-11-18 – 2014-11-20 (×5): 100 mg via ORAL
  Filled 2014-11-13 (×15): qty 1

## 2014-11-13 MED ORDER — SODIUM CHLORIDE 0.9 % IV SOLN
INTRAVENOUS | Status: DC
  Administered 2014-11-13 – 2014-11-16 (×4): via INTRAVENOUS
  Administered 2014-11-18: 1000 mL via INTRAVENOUS

## 2014-11-13 MED ORDER — FERROUS SULFATE 325 (65 FE) MG PO TABS
325.0000 mg | ORAL_TABLET | Freq: Three times a day (TID) | ORAL | Status: DC
  Administered 2014-11-14 – 2014-11-20 (×15): 325 mg via ORAL
  Filled 2014-11-13 (×20): qty 1

## 2014-11-13 MED ORDER — SODIUM CHLORIDE 0.9 % IV SOLN: 250.0000 mL | INTRAVENOUS | Status: DC

## 2014-11-13 MED ORDER — LACTATED RINGERS IV SOLN
INTRAVENOUS | Status: DC
  Administered 2014-11-13 (×2): via INTRAVENOUS

## 2014-11-13 MED ORDER — THROMBIN 5000 UNITS EX SOLR: CUTANEOUS | Status: DC | PRN

## 2014-11-13 MED ORDER — RIFAXIMIN 550 MG PO TABS
550.0000 mg | ORAL_TABLET | Freq: Two times a day (BID) | ORAL | Status: DC
  Administered 2014-11-14 – 2014-11-20 (×13): 550 mg via ORAL
  Filled 2014-11-13 (×15): qty 1

## 2014-11-13 MED ORDER — SODIUM CHLORIDE 0.9 % IJ SOLN
3.0000 mL | Freq: Two times a day (BID) | INTRAMUSCULAR | Status: DC
  Administered 2014-11-13 – 2014-11-20 (×8): 3 mL via INTRAVENOUS

## 2014-11-13 MED ORDER — SODIUM CHLORIDE 0.9 % IJ SOLN: 3.0000 mL | INTRAMUSCULAR | Status: DC | PRN

## 2014-11-13 MED ORDER — SERTRALINE HCL 50 MG PO TABS: 50.0000 mg | ORAL_TABLET | Freq: Every day | ORAL | Status: DC

## 2014-11-13 MED ORDER — SODIUM CHLORIDE 0.9 % IV SOLN: Freq: Once | INTRAVENOUS | Status: DC

## 2014-11-13 MED ORDER — ALLOPURINOL 100 MG PO TABS
100.0000 mg | ORAL_TABLET | Freq: Two times a day (BID) | ORAL | Status: DC
  Administered 2014-11-14 – 2014-11-20 (×13): 100 mg via ORAL
  Filled 2014-11-13 (×15): qty 1

## 2014-11-13 MED ORDER — NADOLOL 20 MG PO TABS: 20.0000 mg | ORAL_TABLET | Freq: Every day | ORAL | Status: DC

## 2014-11-13 MED ORDER — VANCOMYCIN HCL IN DEXTROSE 1-5 GM/200ML-% IV SOLN
1000.0000 mg | Freq: Once | INTRAVENOUS | Status: AC
  Administered 2014-11-14: 1000 mg via INTRAVENOUS
  Filled 2014-11-13: qty 200

## 2014-11-13 MED ORDER — SODIUM CHLORIDE 0.9 % IJ SOLN
INTRAMUSCULAR | Status: AC
  Filled 2014-11-13: qty 10

## 2014-11-13 MED ORDER — LACTULOSE 10 GM/15ML PO SOLN
30.0000 g | Freq: Two times a day (BID) | ORAL | Status: DC | PRN
  Filled 2014-11-13: qty 45

## 2014-11-13 MED ORDER — BRIMONIDINE TARTRATE 0.2 % OP SOLN
1.0000 [drp] | Freq: Two times a day (BID) | OPHTHALMIC | Status: DC
  Administered 2014-11-13 – 2014-11-20 (×14): 1 [drp] via OPHTHALMIC
  Filled 2014-11-13: qty 5

## 2014-11-13 MED ORDER — DEXMEDETOMIDINE HCL IN NACL 200 MCG/50ML IV SOLN
INTRAVENOUS | Status: DC | PRN
  Administered 2014-11-13: .5 ug/kg/h via INTRAVENOUS

## 2014-11-13 MED ORDER — TRAMADOL HCL 50 MG PO TABS
50.0000 mg | ORAL_TABLET | Freq: Four times a day (QID) | ORAL | Status: DC | PRN
Start: 2014-11-13 — End: 2014-11-13

## 2014-11-13 MED ORDER — SENNA 8.6 MG PO TABS
1.0000 | ORAL_TABLET | Freq: Two times a day (BID) | ORAL | Status: DC
  Administered 2014-11-14 – 2014-11-20 (×11): 8.6 mg via ORAL
  Filled 2014-11-13 (×15): qty 1

## 2014-11-13 MED ORDER — THROMBIN 20000 UNITS EX SOLR
OROMUCOSAL | Status: DC | PRN
  Administered 2014-11-13: 18:00:00 via TOPICAL

## 2014-11-13 MED ORDER — 0.9 % SODIUM CHLORIDE (POUR BTL) OPTIME
TOPICAL | Status: DC | PRN
  Administered 2014-11-13: 1000 mL

## 2014-11-13 MED ORDER — DEXAMETHASONE SODIUM PHOSPHATE 10 MG/ML IJ SOLN
INTRAMUSCULAR | Status: AC
  Filled 2014-11-13: qty 1

## 2014-11-13 MED ORDER — INSULIN ASPART 100 UNIT/ML ~~LOC~~ SOLN
0.0000 [IU] | SUBCUTANEOUS | Status: DC
  Administered 2014-11-14 (×2): 15 [IU] via SUBCUTANEOUS
  Administered 2014-11-14: 4 [IU] via SUBCUTANEOUS
  Administered 2014-11-14: 11 [IU] via SUBCUTANEOUS
  Administered 2014-11-14: 7 [IU] via SUBCUTANEOUS
  Administered 2014-11-14: 15 [IU] via SUBCUTANEOUS
  Administered 2014-11-15: 4 [IU] via SUBCUTANEOUS
  Administered 2014-11-15 (×4): 7 [IU] via SUBCUTANEOUS
  Administered 2014-11-15 (×2): 4 [IU] via SUBCUTANEOUS
  Administered 2014-11-16: 11 [IU] via SUBCUTANEOUS
  Administered 2014-11-16: 4 [IU] via SUBCUTANEOUS
  Administered 2014-11-16 (×3): 7 [IU] via SUBCUTANEOUS
  Administered 2014-11-16: 4 [IU] via SUBCUTANEOUS
  Administered 2014-11-17 (×2): 7 [IU] via SUBCUTANEOUS
  Administered 2014-11-17: 4 [IU] via SUBCUTANEOUS
  Administered 2014-11-17 (×3): 7 [IU] via SUBCUTANEOUS
  Administered 2014-11-18: 3 [IU] via SUBCUTANEOUS
  Administered 2014-11-18: 11 [IU] via SUBCUTANEOUS
  Administered 2014-11-18: 3 [IU] via SUBCUTANEOUS
  Administered 2014-11-18: 4 [IU] via SUBCUTANEOUS
  Administered 2014-11-18: 3 [IU] via SUBCUTANEOUS
  Administered 2014-11-19: 11 [IU] via SUBCUTANEOUS
  Administered 2014-11-19 (×2): 3 [IU] via SUBCUTANEOUS
  Administered 2014-11-19: 4 [IU] via SUBCUTANEOUS

## 2014-11-13 MED ORDER — PANTOPRAZOLE SODIUM 40 MG PO TBEC: 40.0000 mg | DELAYED_RELEASE_TABLET | Freq: Every day | ORAL | Status: DC

## 2014-11-13 MED ORDER — AMLODIPINE BESYLATE 10 MG PO TABS
10.0000 mg | ORAL_TABLET | Freq: Every day | ORAL | Status: DC
Start: 2014-11-14 — End: 2014-11-13

## 2014-11-13 MED ORDER — FENTANYL CITRATE 0.05 MG/ML IJ SOLN
INTRAMUSCULAR | Status: DC | PRN
  Administered 2014-11-13: 150 ug via INTRAVENOUS

## 2014-11-13 MED ORDER — PHENOL 1.4 % MT LIQD: 1.0000 | OROMUCOSAL | Status: DC | PRN

## 2014-11-13 MED ORDER — PROPOFOL 10 MG/ML IV BOLUS
INTRAVENOUS | Status: AC
  Filled 2014-11-13: qty 20

## 2014-11-13 MED ORDER — ALBUMIN HUMAN 5 % IV SOLN
INTRAVENOUS | Status: DC | PRN
Start: 2014-11-13 — End: 2014-11-13
  Administered 2014-11-13 (×2): via INTRAVENOUS

## 2014-11-13 MED ORDER — EPHEDRINE SULFATE 50 MG/ML IJ SOLN
INTRAMUSCULAR | Status: DC | PRN
  Administered 2014-11-13 (×4): 10 mg via INTRAVENOUS

## 2014-11-13 MED ORDER — LIDOCAINE HCL (CARDIAC) 20 MG/ML IV SOLN
INTRAVENOUS | Status: AC
  Filled 2014-11-13: qty 5

## 2014-11-13 MED ORDER — SUCCINYLCHOLINE CHLORIDE 20 MG/ML IJ SOLN
INTRAMUSCULAR | Status: DC | PRN
  Administered 2014-11-13: 60 mg via INTRAVENOUS

## 2014-11-13 MED ORDER — ACETAMINOPHEN 325 MG PO TABS: 650.0000 mg | ORAL_TABLET | ORAL | Status: DC | PRN

## 2014-11-13 MED ORDER — ONDANSETRON HCL 4 MG/2ML IJ SOLN
4.0000 mg | INTRAMUSCULAR | Status: DC | PRN
  Administered 2014-11-15 – 2014-11-17 (×4): 4 mg via INTRAVENOUS
  Filled 2014-11-13 (×4): qty 2

## 2014-11-13 MED ORDER — ALBUTEROL SULFATE (2.5 MG/3ML) 0.083% IN NEBU: 2.5000 mg | INHALATION_SOLUTION | RESPIRATORY_TRACT | Status: DC | PRN

## 2014-11-13 MED ORDER — CEFAZOLIN SODIUM-DEXTROSE 2-3 GM-% IV SOLR: 2.0000 g | INTRAVENOUS | Status: DC

## 2014-11-13 MED ORDER — PANTOPRAZOLE SODIUM 40 MG PO TBEC
40.0000 mg | DELAYED_RELEASE_TABLET | Freq: Two times a day (BID) | ORAL | Status: DC
  Administered 2014-11-14: 40 mg via ORAL
  Filled 2014-11-13: qty 1

## 2014-11-13 MED ORDER — FENTANYL CITRATE 0.05 MG/ML IJ SOLN
50.0000 ug | INTRAMUSCULAR | Status: DC | PRN
  Administered 2014-11-14 – 2014-11-15 (×2): 50 ug via INTRAVENOUS
  Administered 2014-11-15 (×2): 25 ug via INTRAVENOUS
  Administered 2014-11-16: 50 ug via INTRAVENOUS
  Filled 2014-11-13 (×4): qty 2

## 2014-11-13 MED ORDER — ACETAMINOPHEN 650 MG RE SUPP: 650.0000 mg | RECTAL | Status: DC | PRN

## 2014-11-13 MED ORDER — MORPHINE SULFATE 2 MG/ML IJ SOLN: 1.0000 mg | INTRAMUSCULAR | Status: DC | PRN

## 2014-11-13 MED ORDER — BUPIVACAINE HCL 0.5 % IJ SOLN
INTRAMUSCULAR | Status: DC | PRN
  Administered 2014-11-13: 3.5 mL

## 2014-11-13 MED ORDER — MIDAZOLAM HCL 2 MG/2ML IJ SOLN
INTRAMUSCULAR | Status: AC
  Filled 2014-11-13: qty 2

## 2014-11-13 MED ORDER — DEXMEDETOMIDINE HCL IN NACL 200 MCG/50ML IV SOLN
INTRAVENOUS | Status: AC
  Filled 2014-11-13: qty 50

## 2014-11-13 MED ORDER — MUPIROCIN 2 % EX OINT
TOPICAL_OINTMENT | Freq: Once | CUTANEOUS | Status: AC
  Administered 2014-11-13: 1 via NASAL
  Filled 2014-11-13 (×2): qty 22

## 2014-11-13 MED ORDER — DIAZEPAM 5 MG PO TABS: 5.0000 mg | ORAL_TABLET | Freq: Four times a day (QID) | ORAL | Status: DC | PRN

## 2014-11-13 MED ORDER — ROCURONIUM BROMIDE 100 MG/10ML IV SOLN
INTRAVENOUS | Status: DC | PRN
  Administered 2014-11-13 (×2): 50 mg via INTRAVENOUS

## 2014-11-13 MED ORDER — MIDAZOLAM HCL 5 MG/5ML IJ SOLN
INTRAMUSCULAR | Status: DC | PRN
  Administered 2014-11-13: 2 mg via INTRAVENOUS

## 2014-11-13 MED ORDER — ONDANSETRON HCL 4 MG/2ML IJ SOLN
INTRAMUSCULAR | Status: AC
  Filled 2014-11-13: qty 2

## 2014-11-13 MED ORDER — LIDOCAINE-EPINEPHRINE 1 %-1:100000 IJ SOLN
INTRAMUSCULAR | Status: DC | PRN
  Administered 2014-11-13: 3.5 mL

## 2014-11-13 MED ORDER — FUROSEMIDE 10 MG/ML IJ SOLN
INTRAMUSCULAR | Status: AC
  Filled 2014-11-13: qty 2

## 2014-11-13 MED ORDER — VITAMIN D 1000 UNITS PO TABS
1000.0000 [IU] | ORAL_TABLET | Freq: Every day | ORAL | Status: DC
  Administered 2014-11-15 – 2014-11-20 (×5): 1000 [IU] via ORAL
  Filled 2014-11-13 (×7): qty 1

## 2014-11-13 MED ORDER — FUROSEMIDE 10 MG/ML IJ SOLN
INTRAMUSCULAR | Status: DC | PRN
  Administered 2014-11-13: 10 mg via INTRAMUSCULAR

## 2014-11-13 MED ORDER — CHOLESTYRAMINE 4 G PO PACK
4.0000 g | PACK | Freq: Three times a day (TID) | ORAL | Status: DC
  Administered 2014-11-14 – 2014-11-20 (×9): 4 g via ORAL
  Filled 2014-11-13 (×22): qty 1

## 2014-11-13 MED ORDER — COLCHICINE 0.6 MG PO TABS
0.6000 mg | ORAL_TABLET | Freq: Every day | ORAL | Status: DC | PRN
  Filled 2014-11-13: qty 1

## 2014-11-13 MED ORDER — ROCURONIUM BROMIDE 50 MG/5ML IV SOLN
INTRAVENOUS | Status: AC
  Filled 2014-11-13: qty 2

## 2014-11-13 MED ORDER — PROPOFOL 10 MG/ML IV BOLUS
INTRAVENOUS | Status: DC | PRN
  Administered 2014-11-13: 160 mg via INTRAVENOUS

## 2014-11-13 MED ORDER — LIDOCAINE HCL (CARDIAC) 20 MG/ML IV SOLN
INTRAVENOUS | Status: DC | PRN
  Administered 2014-11-13: 80 mg via INTRAVENOUS

## 2014-11-13 MED ORDER — THROMBIN 5000 UNITS EX SOLR
OROMUCOSAL | Status: DC | PRN
  Administered 2014-11-13: 18:00:00 via TOPICAL
  Administered 2014-11-13: 22:00:00

## 2014-11-13 MED ORDER — CHLORHEXIDINE GLUCONATE 0.12 % MT SOLN
15.0000 mL | Freq: Two times a day (BID) | OROMUCOSAL | Status: DC
  Administered 2014-11-14 – 2014-11-18 (×11): 15 mL via OROMUCOSAL
  Filled 2014-11-13 (×10): qty 15

## 2014-11-13 MED ORDER — FUROSEMIDE 40 MG PO TABS: 40.0000 mg | ORAL_TABLET | Freq: Every day | ORAL | Status: DC

## 2014-11-13 MED ORDER — PHENYLEPHRINE 40 MCG/ML (10ML) SYRINGE FOR IV PUSH (FOR BLOOD PRESSURE SUPPORT)
PREFILLED_SYRINGE | INTRAVENOUS | Status: AC
  Filled 2014-11-13: qty 30

## 2014-11-13 MED ORDER — MENTHOL 3 MG MT LOZG: 1.0000 | LOZENGE | OROMUCOSAL | Status: DC | PRN

## 2014-11-13 MED ORDER — INSULIN GLARGINE 100 UNIT/ML ~~LOC~~ SOLN
80.0000 [IU] | Freq: Every day | SUBCUTANEOUS | Status: DC
  Filled 2014-11-13: qty 0.8

## 2014-11-13 SURGICAL SUPPLY — 74 items
BAG DECANTER FOR FLEXI CONT (MISCELLANEOUS) ×3 IMPLANT
BENZOIN TINCTURE PRP APPL 2/3 (GAUZE/BANDAGES/DRESSINGS) IMPLANT
BLADE CLIPPER SURG (BLADE) IMPLANT
BLADE SURG 11 STRL SS (BLADE) ×3 IMPLANT
BUR DRUM 4.0 (BURR) ×2 IMPLANT
BUR DRUM 4.0MM (BURR) ×1
BUR MATCHSTICK NEURO 3.0 LAGG (BURR) ×6 IMPLANT
CAGE PEEK VISTA-S 11X14X5MM (Cage) ×6 IMPLANT
CAGE PEEK VISTA-S CERV 11X14X5 (Cage) ×6 IMPLANT
CAGE PEEK VISTAS 11X14X6 (Cage) ×3 IMPLANT
CANISTER SUCT 3000ML (MISCELLANEOUS) ×3 IMPLANT
CLOSURE WOUND 1/2 X4 (GAUZE/BANDAGES/DRESSINGS)
CONT SPEC 4OZ CLIKSEAL STRL BL (MISCELLANEOUS) ×3 IMPLANT
DECANTER SPIKE VIAL GLASS SM (MISCELLANEOUS) ×3 IMPLANT
DERMABOND ADVANCED (GAUZE/BANDAGES/DRESSINGS) ×2
DERMABOND ADVANCED .7 DNX12 (GAUZE/BANDAGES/DRESSINGS) ×1 IMPLANT
DRAIN CHANNEL 10M FLAT 3/4 FLT (DRAIN) IMPLANT
DRAPE C-ARM 42X72 X-RAY (DRAPES) ×6 IMPLANT
DRAPE LAPAROTOMY 100X72 PEDS (DRAPES) ×3 IMPLANT
DRAPE MICROSCOPE LEICA (MISCELLANEOUS) ×3 IMPLANT
DRAPE POUCH INSTRU U-SHP 10X18 (DRAPES) ×3 IMPLANT
DRAPE PROXIMA HALF (DRAPES) IMPLANT
DRSG OPSITE POSTOP 3X4 (GAUZE/BANDAGES/DRESSINGS) IMPLANT
DRSG TEGADERM 4X4.75 (GAUZE/BANDAGES/DRESSINGS) IMPLANT
DURAPREP 6ML APPLICATOR 50/CS (WOUND CARE) ×3 IMPLANT
ELECT COATED BLADE 2.86 ST (ELECTRODE) ×3 IMPLANT
ELECT REM PT RETURN 9FT ADLT (ELECTROSURGICAL) ×3
ELECTRODE REM PT RTRN 9FT ADLT (ELECTROSURGICAL) ×1 IMPLANT
EVACUATOR SILICONE 100CC (DRAIN) IMPLANT
GAUZE SPONGE 4X4 16PLY XRAY LF (GAUZE/BANDAGES/DRESSINGS) ×3 IMPLANT
GLOVE BIO SURGEON STRL SZ 6.5 (GLOVE) ×8 IMPLANT
GLOVE BIO SURGEON STRL SZ8 (GLOVE) ×3 IMPLANT
GLOVE BIO SURGEONS STRL SZ 6.5 (GLOVE) ×4
GLOVE BIOGEL PI IND STRL 6.5 (GLOVE) ×3 IMPLANT
GLOVE BIOGEL PI IND STRL 7.5 (GLOVE) ×1 IMPLANT
GLOVE BIOGEL PI IND STRL 8.5 (GLOVE) ×1 IMPLANT
GLOVE BIOGEL PI INDICATOR 6.5 (GLOVE) ×6
GLOVE BIOGEL PI INDICATOR 7.5 (GLOVE) ×2
GLOVE BIOGEL PI INDICATOR 8.5 (GLOVE) ×2
GLOVE ECLIPSE 7.0 STRL STRAW (GLOVE) ×3 IMPLANT
GLOVE EXAM NITRILE LRG STRL (GLOVE) IMPLANT
GLOVE EXAM NITRILE MD LF STRL (GLOVE) IMPLANT
GLOVE EXAM NITRILE XL STR (GLOVE) IMPLANT
GLOVE EXAM NITRILE XS STR PU (GLOVE) IMPLANT
GOWN STRL REUS W/ TWL LRG LVL3 (GOWN DISPOSABLE) ×4 IMPLANT
GOWN STRL REUS W/ TWL XL LVL3 (GOWN DISPOSABLE) IMPLANT
GOWN STRL REUS W/TWL 2XL LVL3 (GOWN DISPOSABLE) IMPLANT
GOWN STRL REUS W/TWL LRG LVL3 (GOWN DISPOSABLE) ×8
GOWN STRL REUS W/TWL XL LVL3 (GOWN DISPOSABLE)
HEMOSTAT POWDER KIT SURGIFOAM (HEMOSTASIS) ×3 IMPLANT
KIT BASIN OR (CUSTOM PROCEDURE TRAY) ×3 IMPLANT
KIT ROOM TURNOVER OR (KITS) ×3 IMPLANT
LIQUID BAND (GAUZE/BANDAGES/DRESSINGS) ×3 IMPLANT
NEEDLE HYPO 25X1 1.5 SAFETY (NEEDLE) ×3 IMPLANT
NEEDLE SPNL 22GX3.5 QUINCKE BK (NEEDLE) ×3 IMPLANT
NS IRRIG 1000ML POUR BTL (IV SOLUTION) ×3 IMPLANT
PACK LAMINECTOMY NEURO (CUSTOM PROCEDURE TRAY) ×3 IMPLANT
PAD ARMBOARD 7.5X6 YLW CONV (MISCELLANEOUS) ×9 IMPLANT
PLATE INVIZIA 4 LEV 72MM (Plate) ×3 IMPLANT
PUTTY BONE GRAFT KIT 2.5ML (Bone Implant) ×3 IMPLANT
RUBBERBAND STERILE (MISCELLANEOUS) ×6 IMPLANT
SCREW SELF DRILL VAR 14MMX4.2 (Screw) ×30 IMPLANT
SPONGE INTESTINAL PEANUT (DISPOSABLE) ×3 IMPLANT
SPONGE SURGIFOAM ABS GEL 100 (HEMOSTASIS) ×3 IMPLANT
STRIP CLOSURE SKIN 1/2X4 (GAUZE/BANDAGES/DRESSINGS) IMPLANT
SUT ETHILON 3 0 FSL (SUTURE) IMPLANT
SUT VIC AB 3-0 SH 8-18 (SUTURE) ×6 IMPLANT
SUT VICRYL 3-0 RB1 18 ABS (SUTURE) ×6 IMPLANT
SYR 20ML ECCENTRIC (SYRINGE) ×3 IMPLANT
TAPE CLOTH 2X10 TAN LF (GAUZE/BANDAGES/DRESSINGS) ×3 IMPLANT
TOWEL OR 17X24 6PK STRL BLUE (TOWEL DISPOSABLE) ×3 IMPLANT
TOWEL OR 17X26 10 PK STRL BLUE (TOWEL DISPOSABLE) ×3 IMPLANT
TRAY FOLEY CATH 16FRSI W/METER (SET/KITS/TRAYS/PACK) ×3 IMPLANT
WATER STERILE IRR 1000ML POUR (IV SOLUTION) ×3 IMPLANT

## 2014-11-13 NOTE — Anesthesia Preprocedure Evaluation (Addendum)
Anesthesia Evaluation  Patient identified by MRN, date of birth, ID band Patient awake  General Assessment Comment:Patient has myelopathy as per surgeon and need for surgery. Case discussed with surgeon. CE  Reviewed: Allergy & Precautions, H&P , Patient's Chart, lab work & pertinent test results  Airway Mallampati: II  TM Distance: >3 FB Neck ROM: Full    Dental   Pulmonary COPD breath sounds clear to auscultation        Cardiovascular hypertension, Rhythm:Regular Rate:Normal     Neuro/Psych Depression CVA    GI/Hepatic hiatal hernia, (+) Hepatitis -  Endo/Other  diabetes  Renal/GU negative Renal ROS     Musculoskeletal   Abdominal   Peds  Hematology   Anesthesia Other Findings   Reproductive/Obstetrics                            Anesthesia Physical Anesthesia Plan  ASA: IV  Anesthesia Plan: General   Post-op Pain Management:    Induction: Intravenous  Airway Management Planned: Oral ETT  Additional Equipment:   Intra-op Plan:   Post-operative Plan: Possible Post-op intubation/ventilation  Informed Consent: I have reviewed the patients History and Physical, chart, labs and discussed the procedure including the risks, benefits and alternatives for the proposed anesthesia with the patient or authorized representative who has indicated his/her understanding and acceptance.   Dental advisory given  Plan Discussed with: CRNA, Anesthesiologist and Surgeon  Anesthesia Plan Comments:         Anesthesia Quick Evaluation

## 2014-11-13 NOTE — OR Nursing (Signed)
3100 unit notified @2205  [facility]

## 2014-11-13 NOTE — Op Note (Signed)
PREOP DIAGNOSIS: Cervical Spondylosis with myelopathy, C3-4, C4-5, C5-6, C6-7  POSTOP DIAGNOSIS: Same  PROCEDURE: 1. Discectomy at C3-4, C4-5, C5-6, C6-7 for decompression of spinal cord and exiting nerve roots  2. Placement of intervertebral biomechanical device, Zimmer PEEK grafts at C3-4, C4-5, C5-6, C6-7 3. Placement of anterior instrumentation consisting of interbody plate and screws spanning C3-C7 4. Use of morselized bone allograft  5. Arthrodesis C3-4, C4-5, C5-6, C6-7, anterior interbody technique  6. Use of intraoperative microscope  SURGEON: Dr. Consuella Lose, MD  ASSISTANT: Dr. Erline Levine, MD  ANESTHESIA: General Endotracheal  EBL: 200cc  SPECIMENS: None  DRAINS: none  COMPLICATIONS: None immediate  CONDITION: Hemodynamically stable to PACU  HISTORY: Luke BARTON Sr. is a 68 y.o. man who initially was seen in the outpatient neurosurgery clinic. He presented with gait difficulty, and weakness. 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 With these findings, surgical decompression was indicated. The risks and benefits of the surgery were explained in detail to the patient and his family. After all their questions were answered, consent was obtained. The patient was found on preoperative testing to have pancytopenia, and he was evaluated by his hematologist. He was brought in the day of surgery, and underwent preoperative platelet and red cell transfusions.  PROCEDURE IN DETAIL: The patient was brought to the operating room and transferred to the operative table. After induction of general anesthesia, the patient was positioned on the operative table in the supine position with all pressure points meticulously padded. The skin of the neck was then prepped and draped in the usual sterile fashion.  After timeout was conducted, the skin was infiltrated with local anesthetic. Skin  incision was then made sharply and Bovie electrocautery was used to dissect the subcutaneous tissue until the platysma was identified. The platysma was then divided and undermined. The sternocleidomastoid muscle was then identified and, utilizing natural fascial planes in the neck, the prevertebral fascia was identified and the carotid sheath was retracted laterally and the trachea and esophagus retracted medially. Again using fluoroscopy, the correct disc spaces were identified. Bovie electrocautery was used to dissect in the subperiosteal plane and elevate the bilateral longus coli muscles. Table mounted retractors were then placed. At this point, the microscope was draped and brought into the field, and the remainder of the case was done under the microscope using microdissecting technique.  The C3-4 disc space was identified, and was completely covered by anterior osteophyte. This was removed using a combination of high-speed drill and rongeurs. The high-speed drill was then used to complete discectomy until the posterior annulus was identified and removed and the posterior longitudinal ligament was identified. Using a nerve hook, the PLL was elevated, and Kerrison rongeurs were used to remove the posterior longitudinal ligament and the ventral thecal sac was identified. Using a combination of curettes and rongeurs, complete decompression of the thecal sac and exiting nerve roots at this level was completed, and verified using micro-nerve hook.  At this point, a PEEK interbody cage was sized and packed with morcellized bone allograft. This was then inserted and tapped into place.  Attention was then turned to the C4-5 level. In a similar fashion, discectomy was completed initially with curettes and rongeurs, and completed with the drill. The PLL was again identified, elevated and incised. Using Kerrison rongeurs, decompression of the spinal cord and exiting roots at the C4-5 level was completed and  confirmed with a dissector. There was  a significant amount of spondylitic and partially calcified tissue at the inferior endplate, behind the C5 vertebral body. This was removed using a combination of rongeurs and angled curettes.  An interbody cage was then sized and filled with bone allograft, and tapped into place. Position of the interbody devices was then confirmed with fluoroscopy.  Attention was then turned to the C5-6 level. In a similar fashion, discectomy was completed initially with curettes and rongeurs, and completed with the drill. Anterior osteophytes were removed using a combination of the high-speed drill and rongeurs. The PLL was again identified, elevated and incised. Using Kerrison rongeurs, decompression of the spinal cord and exiting roots at the C5-6 level was completed and confirmed with a dissector. Again noted at this level was fairly centrally placed disc material causing spinal cord compression.  An interbody cage was then sized and filled with bone allograft, and tapped into place. Position of the interbody devices was then confirmed with fluoroscopy.  Finally, the C6-7 disc space was identified. This was also covered with anterior osteophyte which was removed with the high speed drill and rongeurs. Discectomy was then completed with the high-speed drill. The PLL was again identified, elevated and incised. Using Kerrison rongeurs, decompression of the spinal cord and exiting roots at the C6-7 level was completed and confirmed with a dissector.   An interbody cage was then sized and filled with bone allograft, and tapped into place. Position of the interbody devices was then confirmed with fluoroscopy.  After placement of the intervertebral devices, the anterior cervical plate was selected, and placed across the interspaces. Using a high-speed drill, the cortex of the cervical vertebral bodies was punctured, and screws inserted in the C3, C4, C5, C6, and C7 levels. Final  fluoroscopic images in lateral projections were taken to confirm good hardware placement.  At this point, after all counts were verified to be correct, meticulous hemostasis was secured using a combination of bipolar electrocautery and passive hemostatics. The platysma muscle was then closed using interrupted 3-0 Vicryl sutures, and the skin was closed with a interrupted subcuticular stitch. Dermabond was then applied, and sterile dressings were then placed and the drapes removed.  The patient tolerated the procedure well and was taken to the postanesthesia care unit in stable hemodynamic condition. He did remain intubated due to the multiple medical comorbidities and length of anesthetic time.

## 2014-11-13 NOTE — H&P (Signed)
CC:  Weakness and imbalance  HPI: [name] is a 68 year old man initially seen in the office. He was recently admitted to [facility] for approximately one week. He says that the day after Father's Day, he woke up with low back pain, fairly significant imbalance, and numbness of both his legs and both his arms. He says that prior to waking up that day, he did walk with a cane, however after that he was barely able to stand without falling over. Because of this, his wife called EMS and he was taken to the hospital where he was admitted and a fairly extensive workup was done. In addition to his imbalance, he also says that when he woke up that day he had fairly significant numbness extending from his shoulders down into his fingers on both sides. He also had a similar numbness extending from his hips down to his toes on both sides. He says since then, the numbness comes and goes, and he does not have it right now. In addition, they have noticed that he has had significant difficulty in holding and grabbing objects since that time. He says that a lot of times he has dropped things that he is trying to grab, and alternatively, when he does grab something he finds it is difficult to release his grip and has to open his fingers with his other hand. He does not have any changes in bowel or bladder function. At this time, his back pain is controlled with oral medication. He does not have any leg pain.  He was initially scheduled for surgery a few months ago, however preoperative testing did reveal significant anemia, thrombocytopenia, and surgery was therefore canceled. He has since seen his hematologist. His recommendation was to check his CBC preoperatively, transfused red cells and platelets as needed. He also recommended checking his coagulation status given his liver disease preoperatively.  PMH: Past Medical History  Diagnosis Date  . Stroke   . Hypertension   . COPD (chronic  obstructive pulmonary disease)   . Diabetes mellitus without complication   . H/O hiatal hernia   . Depression   . Arthritis   . Hepatitis     NASH  . 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 Nose surgery    . Appendectomy    . Cardiac catheterization    . Eye surgery      leaglly blind    SH: History  Substance Use Topics  . Smoking status: Never Smoker   . Smokeless tobacco: Not on file  . Alcohol Use: No    MEDS: Prior to Admission medications   Medication Sig Start Date End Date Taking? Authorizing Provider  allopurinol (ZYLOPRIM) 100 MG tablet Take 100 mg by mouth 2 (two) times daily.   Yes Historical Provider, MD  amLODipine (NORVASC) 10 MG tablet Take 10 mg by mouth daily.   Yes Historical Provider, MD  brimonidine-timolol (COMBIGAN) 0.2-0.5 % ophthalmic solution Place 1 drop into the left eye every 12 (twelve) hours.   Yes Historical Provider, MD  Cholecalciferol (VITAMIN D-3) 1000 UNITS CAPS Take 1,000 Units by mouth daily.    Yes Historical Provider, MD  cholestyramine Lucrezia Starch) 4 G packet Take 4 g by mouth 3 (three) times daily.   Yes Historical Provider, MD  colchicine 0.6 MG tablet Take 0.6 mg by mouth daily as needed (gout).   Yes Historical Provider, MD  ferrous  sulfate 325 (65 FE) MG tablet Take 325 mg by mouth 3 (three) times daily with meals.   Yes Historical Provider, MD  furosemide (LASIX) 40 MG tablet Take 40 mg by mouth daily.   Yes Historical Provider, MD  insulin aspart (NOVOLOG) 100 UNIT/ML FlexPen Inject into the skin 3 (three) times daily with meals. Sliding scale   Yes Historical Provider, MD  insulin glargine (LANTUS) 100 UNIT/ML injection Inject 80 Units into the skin at bedtime.   Yes Historical Provider, MD  lactulose (CHRONULAC) 10 GM/15ML solution Take 30 g by mouth 2 (two) times daily as needed for mild constipation.   Yes Historical Provider, MD  nadolol (CORGARD) 20 MG tablet Take  20 mg by mouth daily.   Yes Historical Provider, MD  pantoprazole (PROTONIX) 40 MG tablet Take 40 mg by mouth 2 (two) times daily.   Yes Historical Provider, MD  rifaximin (XIFAXAN) 550 MG TABS tablet Take 550 mg by mouth 2 (two) times daily.   Yes Historical Provider, MD  sertraline (ZOLOFT) 50 MG tablet Take 50 mg by mouth daily.   Yes Historical Provider, MD  traMADol (ULTRAM) 50 MG tablet Take 50 mg by mouth every 6 (six) hours as needed for moderate pain.   Yes Historical Provider, MD  omeprazole (PRILOSEC) 20 MG capsule Take 20 mg by mouth 2 (two) times daily before a meal.    Historical Provider, MD    ALLERGY: Allergies  Allergen Reactions  . Actos [Pioglitazone] Anaphylaxis  . Amitriptyline     Unknown   . Hctz [Hydrochlorothiazide]     Unknown   . Naproxen     Unknown   . Penicillins Rash    rash    ROS: ROS  NEUROLOGIC EXAM: Awake, alert, oriented Memory and concentration grossly intact Speech fluent, appropriate CN grossly intact Motor exam: Upper Extremities Deltoid Bicep Tricep Grip  Right 4+/5 5/5 4+/5 5/5  Left 4+/5 5/5 4+/5 5/5   Lower Extremity IP Quad PF DF EHL  Right 5/5 5/5 5/5 5/5 5/5  Left 5/5 5/5 5/5 5/5 5/5   Sensation grossly intact to LT  IMGAING: MRI of the cervical spine was reviewed which demonstrates straightening of the normal cervical lordosis. There is fairly significant disc osteophyte complex at C3 C4, C4 C5, C5 C6, as well as C6 C7 causing moderate to severe spinal cord compression at these levels.   CT scan of the cervical spine dated February 25, 2013 was also reviewed. This again demonstrates fairly significant spondylitic change of the cervical spine. There are very large anterior osteophytes at C3 C4, with large posterior osteophytes at C3 to C7. His disease does not appear to represent OPLL.  IMPRESSION: - 68 y.o. male with symptoms of myelopathy and MRI demonstrating cord compression due to disc/osteophyte complex at C3-C7. -  Significant medical comorbidites including hepatitis, diabetes, and pancytopenia.  PLAN: - Preoperative Platelet and PRBC transfusions per hematology recommendations - Coags appear normal - ACDF C3-4, C4-5, C5-6, C6-7  I did review the MRI findings with the patient and his wife in the office. Given the degree of cord compression, as well as his clinical symptoms, I did recommend surgical decompression as his best chance for neurologic recovery.   The risks of surgery were discussed in detail with the patient which include but are not limited to spinal cord injury which may result in hand, leg, and bowel dysfunction, postoperative dysphagia, dysphonia, neck hematoma, or subsequent surgery for epidural hematoma. The risk of CSF  leak was also discussed. In addition, I explained to him that after spinal fusion surgery, there is a risk of adjacent level disease requiring future surgical intervention, and a risk of non-union requiring additional surgery. The increased periop risk was also reviewed given his extensive medical comorbidities. The patient understood our discussion as well as the risks of the surgery and is willing to proceed. All questions were answered.

## 2014-11-13 NOTE — Anesthesia Procedure Notes (Signed)
Procedure Name: Intubation Date/Time: 11/13/2014 4:53 PM Performed by: Julian Reil Pre-anesthesia Checklist: Patient identified, Emergency Drugs available, Suction available and Patient being monitored Patient Re-evaluated:Patient Re-evaluated prior to inductionOxygen Delivery Method: Circle system utilized Preoxygenation: Pre-oxygenation with 100% oxygen Intubation Type: IV induction Ventilation: Mask ventilation without difficulty Grade View: Grade I Tube type: Subglottic suction tube Tube size: 7.5 mm Number of attempts: 1 Airway Equipment and Method: Video-laryngoscopy and Stylet Placement Confirmation: ETT inserted through vocal cords under direct vision,  positive ETCO2 and breath sounds checked- equal and bilateral Secured at: 21 cm Tube secured with: Tape Dental Injury: Teeth and Oropharynx as per pre-operative assessment  Comments: Glidescope used d/t unstable neck to help maintain neutral C-spine. Easy atraumatic intubation.

## 2014-11-13 NOTE — Consult Note (Addendum)
PULMONARY / CRITICAL CARE MEDICINE   Name: Luke MENDIOLA Sr. MRN: [id number] DOB: [birth year]    ADMISSION DATE:  11/13/2014 CONSULTATION DATE:  11/13/2014  REFERRING MD : Dr. Kathyrn Sheriff - Neurosurgery  CHIEF COMPLAINT:  Gait difficulty  INITIAL PRESENTATION: 68 year old male presented for surgery 11/24 after prolonged weakness and gait disturbance. Underwent prolonged neurosurgical procedure and remained intubated post-operatively. PCCM consulted for vent management.   STUDIES:    SIGNIFICANT EVENTS: 11/24 - Discectomy at C3-4, C4-5, C5-6, C6-7 for decompression of spinal cord and exiting nerve roots. Placement of intervertebral biomechanical device, Zimmer PEEK grafts at C3-4, C4-5, C5-6, C6-7. Placement of anterior instrumentation consisting of interbody plate and screws spanning C3-C7. Use of morselized bone allograft. Arthrodesis C3-4, C4-5, C5-6, C6-7, anterior interbody technique   HISTORY OF PRESENT ILLNESS:  Luke ALDEA Sr. is a 68 y.o. man with PMH as below, who initially was seen in the outpatient neurosurgery clinic. He presented with gait difficulty, and weakness. 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 The patient was found on preoperative testing to have pancytopenia, and he was evaluated by his hematologist. He was brought in the day of surgery, and underwent preoperative platelet and red cell transfusions. He underwent neurosurgical procedure as described above with no immediate complications. Procedure was prolonged and he was left on the ventilatory post-operatively.   PAST MEDICAL HISTORY :   has a past medical history of Stroke; Hypertension; COPD (chronic obstructive pulmonary disease); Diabetes mellitus without complication; H/O hiatal hernia; Depression; Arthritis; Hepatitis; and Cirrhosis of liver not due to alcohol.  has past surgical history that includes Nose surgery;  Appendectomy; Cardiac catheterization; and Eye surgery. Prior to Admission medications   Medication Sig Start Date End Date Taking? Authorizing Provider  allopurinol (ZYLOPRIM) 100 MG tablet Take 100 mg by mouth 2 (two) times daily.   Yes Historical Provider, MD  amLODipine (NORVASC) 10 MG tablet Take 10 mg by mouth daily.   Yes Historical Provider, MD  brimonidine-timolol (COMBIGAN) 0.2-0.5 % ophthalmic solution Place 1 drop into the left eye every 12 (twelve) hours.   Yes Historical Provider, MD  Cholecalciferol (VITAMIN D-3) 1000 UNITS CAPS Take 1,000 Units by mouth daily.    Yes Historical Provider, MD  cholestyramine Lucrezia Starch) 4 G packet Take 4 g by mouth 3 (three) times daily.   Yes Historical Provider, MD  colchicine 0.6 MG tablet Take 0.6 mg by mouth daily as needed (gout).   Yes Historical Provider, MD  ferrous sulfate 325 (65 FE) MG tablet Take 325 mg by mouth 3 (three) times daily with meals.   Yes Historical Provider, MD  furosemide (LASIX) 40 MG tablet Take 40 mg by mouth daily.   Yes Historical Provider, MD  insulin aspart (NOVOLOG) 100 UNIT/ML FlexPen Inject into the skin 3 (three) times daily with meals. Sliding scale   Yes Historical Provider, MD  insulin glargine (LANTUS) 100 UNIT/ML injection Inject 80 Units into the skin at bedtime.   Yes Historical Provider, MD  lactulose (CHRONULAC) 10 GM/15ML solution Take 30 g by mouth 2 (two) times daily as needed for mild constipation.   Yes Historical Provider, MD  nadolol (CORGARD) 20 MG tablet Take 20 mg by mouth daily.   Yes Historical Provider, MD  pantoprazole (PROTONIX) 40 MG tablet Take 40 mg by mouth 2 (two) times daily.   Yes Historical Provider, MD  rifaximin (XIFAXAN) 550 MG TABS tablet Take 550 mg  by mouth 2 (two) times daily.   Yes Historical Provider, MD  sertraline (ZOLOFT) 50 MG tablet Take 50 mg by mouth daily.   Yes Historical Provider, MD  traMADol (ULTRAM) 50 MG tablet Take 50 mg by mouth every 6 (six) hours as needed  for moderate pain.   Yes Historical Provider, MD  omeprazole (PRILOSEC) 20 MG capsule Take 20 mg by mouth 2 (two) times daily before a meal.    Historical Provider, MD   Allergies  Allergen Reactions  . Actos [Pioglitazone] Anaphylaxis  . Amitriptyline     Unknown   . Hctz [Hydrochlorothiazide]     Unknown   . Naproxen     Unknown   . Penicillins Rash    rash    FAMILY HISTORY:  has no family status information on file.  SOCIAL HISTORY:  reports that he has never smoked. He does not have any smokeless tobacco history on file. He reports that he does not drink alcohol or use illicit drugs.  REVIEW OF SYSTEMS:  Unable, intubated.   SUBJECTIVE:   VITAL SIGNS: Temp:  [97.4 F (36.3 C)-98.5 F (36.9 C)] 97.7 F (36.5 C) (11/24 1529) Pulse Rate:  [64-75] 66 (11/24 1530) Resp:  [18-20] 20 (11/24 1529) BP: (135-170)/(58-74) 170/74 mmHg (11/24 1530) SpO2:  [97 %-100 %] 100 % (11/24 2200) FiO2 (%):  [100 %] 100 % (11/24 2200) Weight:  [88.451 kg (195 lb)] 88.451 kg (195 lb) (11/24 0749) HEMODYNAMICS:   VENTILATOR SETTINGS: Vent Mode:  [-] PRVC FiO2 (%):  [100 %] 100 % Set Rate:  [12 bmp] 12 bmp Vt Set:  [600 mL] 600 mL PEEP:  [5 cmH20] 5 cmH20 Plateau Pressure:  [23 cmH20] 23 cmH20 INTAKE / OUTPUT:  Intake/Output Summary (Last 24 hours) at 11/13/14 2249 Last data filed at 11/13/14 2208  Gross per 24 hour  Intake   3731 ml  Output    715 ml  Net   3016 ml    PHYSICAL EXAMINATION: General:  Overweight male in NAD on vent Neuro: Sedated on vent. RASS -2. HEENT: [place]/AT, closed surgical incision to anterior neck. PERRL, No JVD noted.  Cardiovascular:  RRR, no MRG Lungs:  Clear bilateral breath sounds.  Abdomen:  Obese, soft, non-distended Musculoskeletal:  No acute deformity. Skin:  Intact  LABS:  CBC  Recent Labs Lab 11/13/14 0730 11/13/14 1549  WBC 3.5* 4.7  HGB 8.0* 9.7*  HCT 24.6* 30.0*  PLT 59* 77*   Coag's No results for input(s): APTT, INR in the  last 168 hours. BMET  Recent Labs Lab 11/13/14 0730  NA 140  K 4.0  CL 106  CO2 23  BUN 12  CREATININE 0.59  GLUCOSE 216*   Electrolytes  Recent Labs Lab 11/13/14 0730  CALCIUM 8.7   Sepsis Markers No results for input(s): LATICACIDVEN, PROCALCITON, O2SATVEN in the last 168 hours. ABG No results for input(s): PHART, PCO2ART, PO2ART in the last 168 hours. Liver Enzymes  Recent Labs Lab 11/13/14 0730  AST 40*  ALT 24  ALKPHOS 122*  BILITOT 0.6  ALBUMIN 3.0*   Cardiac Enzymes No results for input(s): TROPONINI, PROBNP in the last 168 hours. Glucose  Recent Labs Lab 11/13/14 1513  GLUCAP 142*    Imaging No results found.   ASSESSMENT / PLAN:  PULMONARY OETT 11/13/2014 A: VDRF in post-operative setting. H/o COPD  P:   Full vent support STAT CXR to verify tube placement - noted in good position Check ABG - vent adjusted  accordingly SBT in AM - after decreasing or stopping precedex VAP prevention per protocol  CARDIOVASCULAR A:  H/o HTN  P:  Tele Hold PO antihypertensives for now (norvasc, lasix, nadolol) until post extubation, BP stable with precedex alone for now.  RENAL A:   No acute issues  P:   Monitor UOP Follow Bmet  GASTROINTESTINAL A:   Hepatitis Cirrhosis - non-alcoholic  P:   NPO as anticipate will extubate in AM Protonix Check LFTs Continue home rifaximin, lactulose, questran.  HEMATOLOGIC A:   Anemia Thrombocytopenia  P:  Follow CBC Check Coags Transfuse per ICU guidelines Continue iron supplementation.   INFECTIOUS A:   No acute issues  P:   Follow WBC and fever curve  ENDOCRINE A:   DM  P:   CBG monitoring and SSI Holding lantus while NPO  NEUROLOGIC A:  Acute encephalopathy due to medical sedation in post op setting.  Gout  P:   RASS goal: -1/-2 Precedex gtt for sedation PRN fentanyl for pain management Hold sertraline Continue allopurinol, colchicine   FAMILY  - Updates: No  family bedside.  - Inter-disciplinary family meet or Palliative Care meeting due by:  12/1   Georgann Housekeeper, ACNP [facility] Pager [phone] or [phone]  Above note edited in full.  Will keep on full vent support with precedex on board overnight then proceed with SBT in AM.  Once precedex is off, will need to restart PO anti-HTN.  PCCM will follow with you.  My CC time independent of extender's time 40 min.  Patient seen and examined, agree with above note.  I dictated the care and orders written for this patient under my direction.  Rush Farmer, MD [phone]

## 2014-11-13 NOTE — Transfer of Care (Signed)
Immediate Anesthesia Transfer of Care Note  Patient: Luke SWIGERT Sr.  Procedure(s) Performed: Procedure(s):  Cervical three-four, Cervical four-five, Cervical five-six, Cervical six- seven anterior cervical decompression with fusion and interbody prosthesis plating and bonegraft (N/A)  Patient Location: ICU  Anesthesia Type:General  Level of Consciousness: sedated and unresponsive  Airway & Oxygen Therapy: Patient remains intubated per anesthesia plan and Patient placed on Ventilator (see vital sign flow sheet for setting)  Post-op Assessment: Report given to PACU RN and Post -op Vital signs reviewed and stable  Post vital signs: Reviewed and stable  Complications: No apparent anesthesia complications

## 2014-11-13 NOTE — Progress Notes (Signed)
ANTIBIOTIC CONSULT NOTE - INITIAL  Pharmacy Consult for Vancomycin Indication: Surgical Prophylaxis  Allergies  Allergen Reactions  . Actos [Pioglitazone] Anaphylaxis  . Amitriptyline     Unknown   . Hctz [Hydrochlorothiazide]     Unknown   . Naproxen     Unknown   . Penicillins Rash    rash    Patient Measurements: Height: 5\' 4"  (162.6 cm) Weight: 195 lb (88.451 kg) IBW/kg (Calculated) : 59.2  Vital Signs: Temp: 97.7 F (36.5 C) (11/24 1529) BP: 170/74 mmHg (11/24 1530) Pulse Rate: 66 (11/24 1530) Intake/Output from previous day:   Intake/Output from this shift: Total I/O In: 2000 [I.V.:1500; IV Piggyback:500] Out: 115 [Urine:15; Blood:100]  Labs:  Recent Labs  11/13/14 0730 11/13/14 1549  WBC 3.5* 4.7  HGB 8.0* 9.7*  PLT 59* 77*  CREATININE 0.59  --    Estimated Creatinine Clearance: 89.9 mL/min (by C-G formula based on Cr of 0.59). No results for input(s): VANCOTROUGH, VANCOPEAK, VANCORANDOM, GENTTROUGH, GENTPEAK, GENTRANDOM, TOBRATROUGH, TOBRAPEAK, TOBRARND, AMIKACINPEAK, AMIKACINTROU, AMIKACIN in the last 72 hours.   Microbiology: Recent Results (from the past 720 hour(s))  Surgical pcr screen     Status: None   Collection Time: 11/13/14  7:55 AM  Result Value Ref Range Status   MRSA, PCR NEGATIVE NEGATIVE Final   Staphylococcus aureus NEGATIVE NEGATIVE Final    Comment:        The Xpert SA Assay (FDA approved for NASAL specimens in patients over 57 years of age), is one component of a comprehensive surveillance program.  Test performance has been validated by [employer] for patients greater than or equal to 18 year old. It is not intended to diagnose infection nor to guide or monitor treatment.     Medical History: Past Medical History  Diagnosis Date  . Stroke   . Hypertension   . COPD (chronic obstructive pulmonary disease)   . Diabetes mellitus without complication   . H/O hiatal hernia   . Depression   . Arthritis   .  Hepatitis     NASH  . Cirrhosis of liver not due to alcohol     Medications:  Prescriptions prior to admission  Medication Sig Dispense Refill Last Dose  . allopurinol (ZYLOPRIM) 100 MG tablet Take 100 mg by mouth 2 (two) times daily.   11/12/2014 at Unknown time  . amLODipine (NORVASC) 10 MG tablet Take 10 mg by mouth daily.   11/12/2014 at Unknown time  . brimonidine-timolol (COMBIGAN) 0.2-0.5 % ophthalmic solution Place 1 drop into the left eye every 12 (twelve) hours.   11/12/2014 at Unknown time  . Cholecalciferol (VITAMIN D-3) 1000 UNITS CAPS Take 1,000 Units by mouth daily.    11/12/2014 at Unknown time  . cholestyramine (QUESTRAN) 4 G packet Take 4 g by mouth 3 (three) times daily.   11/12/2014 at Unknown time  . colchicine 0.6 MG tablet Take 0.6 mg by mouth daily as needed (gout).   unknown  . ferrous sulfate 325 (65 FE) MG tablet Take 325 mg by mouth 3 (three) times daily with meals.   11/12/2014 at Unknown time  . furosemide (LASIX) 40 MG tablet Take 40 mg by mouth daily.   11/12/2014 at Unknown time  . insulin aspart (NOVOLOG) 100 UNIT/ML FlexPen Inject into the skin 3 (three) times daily with meals. Sliding scale   11/12/2014 at Unknown time  . insulin glargine (LANTUS) 100 UNIT/ML injection Inject 80 Units into the skin at bedtime.   11/12/2014 at  Unknown time  . lactulose (CHRONULAC) 10 GM/15ML solution Take 30 g by mouth 2 (two) times daily as needed for mild constipation.   11/12/2014 at Unknown time  . nadolol (CORGARD) 20 MG tablet Take 20 mg by mouth daily.   11/12/2014 at Unknown time  . pantoprazole (PROTONIX) 40 MG tablet Take 40 mg by mouth 2 (two) times daily.   11/12/2014 at Unknown time  . rifaximin (XIFAXAN) 550 MG TABS tablet Take 550 mg by mouth 2 (two) times daily.   11/12/2014 at Unknown time  . sertraline (ZOLOFT) 50 MG tablet Take 50 mg by mouth daily.   11/12/2014 at Unknown time  . traMADol (ULTRAM) 50 MG tablet Take 50 mg by mouth every 6 (six) hours as  needed for moderate pain.   Past Week at Unknown time  . omeprazole (PRILOSEC) 20 MG capsule Take 20 mg by mouth 2 (two) times daily before a meal.      Scheduled:  . allopurinol  100 mg Oral BID  . [START ON 11/14/2014] amLODipine  10 mg Oral Daily  . cholestyramine  4 g Oral TID  . docusate sodium  100 mg Oral BID  . [START ON 11/14/2014] ferrous sulfate  325 mg Oral TID WC  . [START ON 11/14/2014] furosemide  40 mg Oral Daily  . [START ON 11/14/2014] insulin aspart  0-20 Units Subcutaneous 6 times per day  . insulin glargine  80 Units Subcutaneous QHS  . [START ON 11/14/2014] nadolol  20 mg Oral Daily  . pantoprazole  40 mg Oral BID  . [START ON 11/14/2014] pantoprazole  40 mg Oral Daily  . rifaximin  550 mg Oral BID  . senna  1 tablet Oral BID  . [START ON 11/14/2014] sertraline  50 mg Oral Daily  . sodium chloride  3 mL Intravenous Q12H  . [START ON 11/14/2014] Vitamin D-3  1,000 Units Oral Daily   Infusions:  . sodium chloride    . sodium chloride     Assessment: 68yo male here for spine surgery. Pharmacy is consulted to dose vancomycin post-op for surgical prophylaxis. The patient does not have a drain in place and will receive a single dose of vancomycin 12 hours post-op.  Goal of Therapy:  Reduce risk of infection  Plan:  Vancomycin 1g IV x 1  Pharmacy will sign off for now. Please re-consult if needed.  Andrey Cota. Diona Foley, PharmD Clinical Pharmacist Pager [phone] 11/13/2014,10:37 PM

## 2014-11-13 NOTE — Progress Notes (Signed)
Notified dr Kathyrn Sheriff regarding hemoglobin 8.0. Stated he had ordered 1 unit of prbc preop - but order was standing instead of sign/held to release. Notified him of this. Patient is in neuro holding at this time.

## 2014-11-14 ENCOUNTER — Encounter (HOSPITAL_COMMUNITY): Payer: Self-pay | Admitting: Neurosurgery

## 2014-11-14 ENCOUNTER — Inpatient Hospital Stay (HOSPITAL_COMMUNITY): Payer: Medicare Other

## 2014-11-14 ENCOUNTER — Other Ambulatory Visit: Payer: Self-pay

## 2014-11-14 DIAGNOSIS — Z419 Encounter for procedure for purposes other than remedying health state, unspecified: Secondary | ICD-10-CM | POA: Insufficient documentation

## 2014-11-14 DIAGNOSIS — M4712 Other spondylosis with myelopathy, cervical region: Principal | ICD-10-CM

## 2014-11-14 DIAGNOSIS — Z01818 Encounter for other preprocedural examination: Secondary | ICD-10-CM | POA: Insufficient documentation

## 2014-11-14 DIAGNOSIS — J9602 Acute respiratory failure with hypercapnia: Secondary | ICD-10-CM | POA: Insufficient documentation

## 2014-11-14 DIAGNOSIS — E872 Acidosis: Secondary | ICD-10-CM | POA: Insufficient documentation

## 2014-11-14 DIAGNOSIS — Z4659 Encounter for fitting and adjustment of other gastrointestinal appliance and device: Secondary | ICD-10-CM | POA: Insufficient documentation

## 2014-11-14 LAB — BLOOD GAS, ARTERIAL
Acid-base deficit: 2.2 mmol/L — ABNORMAL HIGH (ref 0.0–2.0)
Bicarbonate: 22.9 mEq/L (ref 20.0–24.0)
DRAWN BY: 418751
FIO2: 100 %
LHR: 12 {breaths}/min
MECHVT: 600 mL
O2 Saturation: 100 %
PATIENT TEMPERATURE: 98.6
PEEP/CPAP: 5 cmH2O
PH ART: 7.325 — AB (ref 7.350–7.450)
TCO2: 24.3 mmol/L (ref 0–100)
pCO2 arterial: 45.2 mmHg — ABNORMAL HIGH (ref 35.0–45.0)
pO2, Arterial: 313 mmHg — ABNORMAL HIGH (ref 80.0–100.0)

## 2014-11-14 LAB — CBC
HEMATOCRIT: 28.7 % — AB (ref 39.0–52.0)
HEMOGLOBIN: 9.2 g/dL — AB (ref 13.0–17.0)
MCH: 29.8 pg (ref 26.0–34.0)
MCHC: 32.1 g/dL (ref 30.0–36.0)
MCV: 92.9 fL (ref 78.0–100.0)
Platelets: 69 10*3/uL — ABNORMAL LOW (ref 150–400)
RBC: 3.09 MIL/uL — AB (ref 4.22–5.81)
RDW: 16.7 % — ABNORMAL HIGH (ref 11.5–15.5)
WBC: 6.5 10*3/uL (ref 4.0–10.5)

## 2014-11-14 LAB — PREPARE PLATELET PHERESIS: Unit division: 0

## 2014-11-14 LAB — GLUCOSE, CAPILLARY
GLUCOSE-CAPILLARY: 289 mg/dL — AB (ref 70–99)
GLUCOSE-CAPILLARY: 326 mg/dL — AB (ref 70–99)
Glucose-Capillary: 347 mg/dL — ABNORMAL HIGH (ref 70–99)
Glucose-Capillary: 301 mg/dL — ABNORMAL HIGH (ref 70–99)
Glucose-Capillary: 206 mg/dL — ABNORMAL HIGH (ref 70–99)
Glucose-Capillary: 173 mg/dL — ABNORMAL HIGH (ref 70–99)

## 2014-11-14 LAB — PROTIME-INR
INR: 1.4 (ref 0.00–1.49)
PROTHROMBIN TIME: 17.3 s — AB (ref 11.6–15.2)

## 2014-11-14 LAB — APTT: aPTT: 34 seconds (ref 24–37)

## 2014-11-14 LAB — MRSA PCR SCREENING: MRSA BY PCR: NEGATIVE

## 2014-11-14 MED ORDER — PANTOPRAZOLE SODIUM 40 MG PO TBEC: 80.0000 mg | DELAYED_RELEASE_TABLET | Freq: Two times a day (BID) | ORAL | Status: DC

## 2014-11-14 MED ORDER — LACTULOSE 10 GM/15ML PO SOLN
20.0000 g | Freq: Every day | ORAL | Status: DC
  Administered 2014-11-14: 20 g
  Filled 2014-11-14: qty 30

## 2014-11-14 MED ORDER — PANTOPRAZOLE SODIUM 40 MG PO TBEC
40.0000 mg | DELAYED_RELEASE_TABLET | Freq: Two times a day (BID) | ORAL | Status: DC
  Administered 2014-11-17 – 2014-11-20 (×6): 40 mg via ORAL
  Filled 2014-11-14 (×6): qty 1

## 2014-11-14 MED ORDER — PNEUMOCOCCAL VAC POLYVALENT 25 MCG/0.5ML IJ INJ
0.5000 mL | INJECTION | INTRAMUSCULAR | Status: AC
  Administered 2014-11-15: 0.5 mL via INTRAMUSCULAR
  Filled 2014-11-14: qty 0.5

## 2014-11-14 MED ORDER — LACTULOSE 10 GM/15ML PO SOLN
20.0000 g | Freq: Two times a day (BID) | ORAL | Status: DC
  Administered 2014-11-15 – 2014-11-20 (×7): 20 g
  Filled 2014-11-14 (×12): qty 30

## 2014-11-14 NOTE — Consult Note (Addendum)
PULMONARY / CRITICAL CARE MEDICINE   Name: Luke NAUTA Sr. MRN: [id number] DOB: Aug 06, 1946    ADMISSION DATE:  11/13/2014 CONSULTATION DATE:  11/13/2014  REFERRING MD : Dr. Kathyrn Sheriff - Neurosurgery  CHIEF COMPLAINT:  Gait difficulty  INITIAL PRESENTATION: 68 year old male presented for surgery 11/24 after prolonged weakness and gait disturbance. Underwent prolonged neurosurgical procedure and remained intubated post-operatively. PCCM consulted for vent management.   STUDIES:    SIGNIFICANT EVENTS: 11/24 - Discectomy at C3-4, C4-5, C5-6, C6-7 for decompression of spinal cord and exiting nerve roots. Placement of intervertebral biomechanical device, Zimmer PEEK grafts at C3-4, C4-5, C5-6, C6-7. Placement of anterior instrumentation consisting of interbody plate and screws spanning C3-C7. Use of morselized bone allograft. Arthrodesis C3-4, C4-5, C5-6, C6-7, anterior interbody technique  11/25- awake, remains vented  PAST MEDICAL HISTORY :   has a past medical history of Stroke; Hypertension; COPD (chronic obstructive pulmonary disease); Diabetes mellitus without complication; H/O hiatal hernia; Depression; Arthritis; Hepatitis; and Cirrhosis of liver not due to alcohol.  has past surgical history that includes Nose surgery; Appendectomy; Cardiac catheterization; and Eye surgery.   SUBJECTIVE:   VITAL SIGNS: Temp:  [97.7 F (36.5 C)-98.8 F (37.1 C)] 98.8 F (37.1 C) (11/25 0748) Pulse Rate:  [62-72] 68 (11/25 0800) Resp:  [15-20] 18 (11/25 0800) BP: (128-170)/(59-78) 139/63 mmHg (11/25 0800) SpO2:  [97 %-100 %] 100 % (11/25 0800) FiO2 (%):  [40 %-100 %] 40 % (11/25 0758) Weight:  [91.5 kg (201 lb 11.5 oz)] 91.5 kg (201 lb 11.5 oz) (11/24 2235) HEMODYNAMICS:   VENTILATOR SETTINGS: Vent Mode:  [-] PSV;CPAP FiO2 (%):  [40 %-100 %] 40 % Set Rate:  [10 bmp-12 bmp] 10 bmp Vt Set:  [500 mL-600 mL] 500 mL PEEP:  [5 cmH20] 5 cmH20 Pressure Support:  [5 cmH20] 5 cmH20 Plateau  Pressure:  [18 cmH20-23 cmH20] 18 cmH20 INTAKE / OUTPUT:  Intake/Output Summary (Last 24 hours) at 11/14/14 1054 Last data filed at 11/14/14 0800  Gross per 24 hour  Intake 4418.8 ml  Output   1690 ml  Net 2728.8 ml    PHYSICAL EXAMINATION: General:  Overweight male in NAD on vent Neuro: Sedated on vent. RASS -1 HEENT: [place]/AT, closed surgical incision to anterior neck. PERRL, No JVD noted. Leaks   Cardiovascular:  RRR, no MRG Lungs:  CTA Abdomen:  Obese, soft, non-distended Musculoskeletal:  No acute deformity. Skin:  Intact  LABS:  CBC  Recent Labs Lab 11/13/14 0730 11/13/14 1549  WBC 3.5* 4.7  HGB 8.0* 9.7*  HCT 24.6* 30.0*  PLT 59* 77*   Coag's No results for input(s): APTT, INR in the last 168 hours. BMET  Recent Labs Lab 11/13/14 0730  NA 140  K 4.0  CL 106  CO2 23  BUN 12  CREATININE 0.59  GLUCOSE 216*   Electrolytes  Recent Labs Lab 11/13/14 0730  CALCIUM 8.7   Sepsis Markers No results for input(s): LATICACIDVEN, PROCALCITON, O2SATVEN in the last 168 hours. ABG  Recent Labs Lab 11/13/14 2340  PHART 7.325*  PCO2ART 45.2*  PO2ART 313.0*   Liver Enzymes  Recent Labs Lab 11/13/14 0730  AST 40*  ALT 24  ALKPHOS 122*  BILITOT 0.6  ALBUMIN 3.0*   Cardiac Enzymes No results for input(s): TROPONINI, PROBNP in the last 168 hours. Glucose  Recent Labs Lab 11/13/14 1513 11/14/14 0024 11/14/14 0446 11/14/14 0747  GLUCAP 142* 326* 347* 301*    Imaging Portable Chest Xray  11/14/2014  CLINICAL DATA:  Encounter for intubation  EXAM: PORTABLE CHEST - 1 VIEW  COMPARISON:  10/26/2014  FINDINGS: The ET tube tip is above the carina. There is a nasogastric tube with tip in the stomach. Mild cardiac enlargement. Pulmonary vascular congestion noted. No overt effusion or edema.  IMPRESSION: 1. ET tube tip is above the carina. 2. Cardiac enlargement and pulmonary vascular congestion.   Electronically Signed   By: Kerby Moors M.D.   On:  11/14/2014 00:07   Dg Abd Portable 1v  11/14/2014   CLINICAL DATA:  Encounter for orogastric tube placement  EXAM: PORTABLE ABDOMEN - 1 VIEW  COMPARISON:  None  FINDINGS: The orogastric tube tip is in the distal stomach. Bowel gas pattern is unremarkable.  IMPRESSION: Tip of orogastric tube is in the distal stomach.   Electronically Signed   By: Kerby Moors M.D.   On: 11/14/2014 00:20    ASSESSMENT / PLAN:  PULMONARY OETT 11/13/2014 A: VDRF in post-operative setting. H/o COPD S/p Anterior fusion  P:   Leak test , has leak Great neurostatus Wean cpap 5 ps 5, goal 30 min IS important if extubated, upright as able  CARDIOVASCULAR A:  H/o HTN  P:  Tele Hold PO antihypertensives , re assess BP after precedex off  RENAL A:   No acute issues  P:   Monitor UOP Follow Bmet, continued saline, follow Cl  GASTROINTESTINAL A:   Hepatitis Cirrhosis - non-alcoholic  P:   NPO Protonix Check LFTs - unimpressive for acute illness Continue home rifaximin Low threshold lactulose admin with blood transfusion NO role ammonia levels, just treat clinically Limit tylenal t 1 gram day  HEMATOLOGIC A:   Anemia Thrombocytopenia Pancytopenia (etiology unclear liver dz?)  P:  Follow CBC in am  Check Coags, now, still needed, if coags elevated would assess factor 8  Transfuse per ICU guidelines scd Continue iron supplementation.  Goal even to slight pos balance repeat cbc now  INFECTIOUS A:   No acute issues  P:   Follow WBC and fever curve  ENDOCRINE A:   DM  P:   CBG monitoring and SSI Holding lantus while NPO, may need int IV bolus insulin Avoid any hypoglcyemia If diet started  = lantus start  NEUROLOGIC A:  Acute encephalopathy due to medical sedation in post op setting.  High risk hepatic enceph with prbc Gout  P:   RASS goal: -1/-2 Precedex gtt for sedation - off PRN fentanyl for pain management Hold sertraline Continue allopurinol,  Dc  colchicine with liver dz as well as acute hosp rifax Add lactulose daily   FAMILY  - Updates: family bedside updated 11/25  - Inter-disciplinary family meet or Palliative Care meeting due by:  12/1  Ccm time [place]. Titus Mould, MD, [place] Pgr: [facility]

## 2014-11-14 NOTE — Evaluation (Signed)
Physical Therapy Evaluation Patient Details Name: Luke SALLS Sr. MRN: [id number] DOB: July 16, 1946 Today's Date: 11/14/2014   History of Present Illness  pt presents with C3-7 ACDF intubated 11/24- 11/25.    Clinical Impression  Pt with mild cognitive deficits, however no family present to determine if this is baseline cognition.  Will need to confirm that family is capable of providing 24hr care as pt states.  Pt has all needed DME and would benefit from HHPT at D/C.  Will follow while on acute.      Follow Up Recommendations Home health PT;Supervision/Assistance - 24 hour    Equipment Recommendations  None recommended by PT    Recommendations for Other Services OT consult     Precautions / Restrictions Precautions Precautions: Fall;Cervical Required Braces or Orthoses: Cervical Brace Cervical Brace: Hard collar;At all times Restrictions Weight Bearing Restrictions: No      Mobility  Bed Mobility               General bed mobility comments: pt sitting in recliner.    Transfers Overall transfer level: Needs assistance Equipment used: Pushed w/c Transfers: Sit to/from Stand Sit to Stand: Min assist         General transfer comment: A for balance.  cues for UE use and sttending to task.    Ambulation/Gait Ambulation/Gait assistance: Min assist Ambulation Distance (Feet): 150 Feet Assistive device:  (pushed W/C) Gait Pattern/deviations: Step-through pattern;Decreased stride length     General Gait Details: pt moves with varying speeds and needs A for maintaining balance.  pt has difficulty following directional cues.    Stairs            Wheelchair Mobility    Modified Rankin (Stroke Patients Only)       Balance Overall balance assessment: Needs assistance Sitting-balance support: No upper extremity supported;Feet supported Sitting balance-Leahy Scale: Fair     Standing balance support: Bilateral upper extremity supported;During  functional activity Standing balance-Leahy Scale: Poor                               Pertinent Vitals/Pain Pain Assessment: 0-10 Pain Score: 5  Pain Location: neck Pain Descriptors / Indicators: Aching Pain Intervention(s): Monitored during session;Premedicated before session    Home Living Family/patient expects to be discharged to:: Private residence Living Arrangements: Spouse/significant other Available Help at Discharge: Family;Available 24 hours/day Type of Home: Apartment Home Access: Level entry     Home Layout: One level Home Equipment: Walker - 2 wheels;Cane - single point Additional Comments: Need to confirm home set-up as pt with some cognitive deficits.      Prior Function Level of Independence: Independent with assistive device(s)               Hand Dominance        Extremity/Trunk Assessment   Upper Extremity Assessment: Defer to OT evaluation           Lower Extremity Assessment: Generalized weakness      Cervical / Trunk Assessment: Normal  Communication   Communication: No difficulties  Cognition Arousal/Alertness: Awake/alert Behavior During Therapy: WFL for tasks assessed/performed Overall Cognitive Status: No family/caregiver present to determine baseline cognitive functioning                      General Comments      Exercises        Assessment/Plan    PT Assessment  Patient needs continued PT services  PT Diagnosis Difficulty walking;Generalized weakness   PT Problem List Decreased strength;Decreased activity tolerance;Decreased balance;Decreased mobility;Decreased knowledge of use of DME;Decreased cognition  PT Treatment Interventions DME instruction;Gait training;Functional mobility training;Therapeutic activities;Therapeutic exercise;Balance training;Neuromuscular re-education;Patient/family education;Cognitive remediation   PT Goals (Current goals can be found in the Care Plan section) Acute Rehab  PT Goals Patient Stated Goal: Go home PT Goal Formulation: With patient Time For Goal Achievement: 11/21/14 Potential to Achieve Goals: Good    Frequency Min 5X/week   Barriers to discharge        Co-evaluation               End of Session Equipment Utilized During Treatment: Gait belt;Oxygen Activity Tolerance: Patient tolerated treatment well Patient left: in chair;with call bell/phone within reach Nurse Communication: Mobility status         Time: 7353-2992 PT Time Calculation (min) (ACUTE ONLY): 29 min   Charges:   PT Evaluation $Initial PT Evaluation Tier I: 1 Procedure PT Treatments $Gait Training: 8-22 mins   PT G CodesCatarina Hartshorn, [place] 11/14/2014, 2:58 PM

## 2014-11-14 NOTE — Progress Notes (Signed)
Pt complaining of chest pain and tightness of chest. Notified MD and orders given. Will continue to monitor.

## 2014-11-14 NOTE — Progress Notes (Signed)
UR completed.  Victoire Deans, RN BSN MHA CCM Trauma/Neuro ICU Case Manager [phone]  

## 2014-11-14 NOTE — Progress Notes (Signed)
Pt seen and examined this am. No issues overnight.  EXAM: Temp:  [97.9 F (36.6 C)-99.8 F (37.7 C)] 98.2 F (36.8 C) (11/25 1514) Pulse Rate:  [61-71] 64 (11/25 1400) Resp:  [15-20] 17 (11/25 1400) BP: (111-170)/(47-78) 117/50 mmHg (11/25 1400) SpO2:  [92 %-100 %] 96 % (11/25 1400) FiO2 (%):  [40 %-100 %] 40 % (11/25 0758) Weight:  [91.5 kg (201 lb 11.5 oz)] 91.5 kg (201 lb 11.5 oz) (11/24 2235) Intake/Output      11/24 0701 - 11/25 0700 11/25 0701 - 11/26 0700   I.V. (mL/kg) 3408.8 (37.3) 611.1 (6.7)   Blood 531    IV Piggyback 700    Total Intake(mL/kg) 4639.8 (50.7) 611.1 (6.7)   Urine (mL/kg/hr) 1465 (0.7) 625 (0.8)   Blood 100 (0)    Total Output 1565 625   Net +3074.8 -13.9         Awake, alert, nods appropriately to questions Aspen collar in place Moves BUE/BLE with good strength  LABS: Lab Results  Component Value Date   CREATININE 0.59 11/13/2014   BUN 12 11/13/2014   NA 140 11/13/2014   K 4.0 11/13/2014   CL 106 11/13/2014   CO2 23 11/13/2014   Lab Results  Component Value Date   WBC 6.5 11/14/2014   HGB 9.2* 11/14/2014   HCT 28.7* 11/14/2014   MCV 92.9 11/14/2014   PLT 69* 11/14/2014    IMPRESSION: - 68 y.o. male POD#1 s/p C3-C7 ACDF - Appears to be at neurologic baseline - Kept intubated overnight for airway protection given thrombocytopenia, length of surgery, etc  PLAN: - Extubate today per PCCM - PT/OT eval  - Can likely transfer to floor after extubation, with plan for likely tomorrow or Fri

## 2014-11-14 NOTE — Progress Notes (Signed)
[facility] Progress Note Patient Name: Luke TABORDA Sr. DOB: Mar 01, 1946 MRN: [id number]   Date of Service  11/14/2014  HPI/Events of Note  Chest pain, normal vital signs  eICU Interventions  12 lead EKG     Intervention Category Intermediate Interventions: Pain - evaluation and management  Elle Vezina 11/14/2014, 11:35 PM

## 2014-11-14 NOTE — Progress Notes (Addendum)
Inpatient Diabetes Program Recommendations  AACE/ADA: New Consensus Statement on Inpatient Glycemic Control (2013)  Target Ranges:  Prepandial:   less than 140 mg/dL      Peak postprandial:   less than 180 mg/dL (1-2 hours)      Critically ill patients:  140 - 180 mg/dL   Results for BRICYN, LABRADA (MRN [id number]) as of 11/14/2014 11:01  Ref. Range 11/13/2014 15:13 11/14/2014 00:24 11/14/2014 04:46 11/14/2014 07:47  Glucose-Capillary Latest Range: 70-99 mg/dL 142 (H) 326 (H) 347 (H) 301 (H)   Diabetes history: DM2 Outpatient Diabetes medications: Lantus 80 units QHS, Novolog sliding scale TID with meals Current orders for Inpatient glycemic control: Novolog 0-20 units Q4H  Inpatient Diabetes Program Recommendations Insulin - Basal: Please consider ordering basal insulin. Recommend starting with Lantus 35 units Q24 to start now (based on 91 ks x 0.4 units).   Note: Glucose has ranged from 142-347 mg/dl over the past 20 hours and patient has received a total of Novolog 45 units for correction over the past 12 hours. Please order basal insulin.  Thanks, Barnie Alderman, RN, MSN, CCRN, CDE Diabetes Coordinator Inpatient Diabetes Program [phone] (Team Pager) [phone] (AP office) [phone] [facility] office)

## 2014-11-14 NOTE — Procedures (Signed)
Extubation Procedure Note  Patient Details:   Name: Luke ARQUETTE Sr. DOB: November 02, 1946 MRN: [id number]   Airway Documentation:  Airway 7.5 mm (Active)  Secured at (cm) 22 cm 11/14/2014  7:58 AM  Measured From Lips 11/14/2014  7:58 AM  [place] 11/14/2014  7:58 AM  Secured By [employer] 11/14/2014  7:58 AM  Tube Holder Repositioned Yes 11/14/2014  7:58 AM  Site Condition Dry 11/14/2014  7:58 AM    Evaluation  O2 sats: stable throughout Complications: No apparent complications Patient did tolerate procedure well. Bilateral Breath Sounds: Clear Suctioning: Airway, Oral Yes  Patient was extubated and placed on 3 LPM nasal cannula. Patient had a positive cuff leak and was able to speak after extubation. No stridor noted. BBS clear. Pt had good strong productive cough. Pt tolerated procedure well. RT will continue to monitor. Vitals stable.   Mika Griffitts M 11/14/2014, 11:44 AM

## 2014-11-14 NOTE — Plan of Care (Signed)
Problem: Consults Goal: Spinal Surgery Patient Education See Patient Education Module for education specifics. Outcome: Completed/Met Date Met:  11/14/14 Goal: Diagnosis - Spinal Surgery Outcome: Completed/Met Date Met:  11/14/14 Goal: Skin Care Protocol Initiated - if Braden Score 18 or less If consults are not indicated, leave blank or document N/A Outcome: Completed/Met Date Met:  11/14/14 Goal: Nutrition Consult-if indicated Outcome: Not Applicable Date Met:  52/77/82 Goal: Diabetes Guidelines if Diabetic/Glucose > 140 If diabetic or lab glucose is > 140 mg/dl - Initiate Diabetes/Hyperglycemia Guidelines & Document Interventions  Outcome: Completed/Met Date Met:  11/14/14  Problem: Phase I Progression Outcomes Goal: Pain controlled with appropriate interventions Outcome: Completed/Met Date Met:  11/14/14 Goal: OOB as tolerated unless otherwise ordered Outcome: Completed/Met Date Met:  11/14/14 Goal: Log roll for position change Outcome: Not Applicable Date Met:  42/35/36 Goal: Initial discharge plan identified Outcome: Completed/Met Date Met:  11/14/14 Goal: PT/OT consults requested Outcome: Completed/Met Date Met:  11/14/14 Goal: Hemodynamically stable Outcome: Completed/Met Date Met:  11/14/14 Goal: Other Phase I Outcomes/Goals Outcome: Completed/Met Date Met:  11/14/14  Problem: Phase II Progression Outcomes Goal: Progress activity as tolerated unless otherwise ordered Outcome: Completed/Met Date Met:  11/14/14 Goal: Discharge plan established Outcome: Completed/Met Date Met:  11/14/14 Goal: Tolerating diet Outcome: Not Applicable Date Met:  14/43/15 Goal: Verbalizes of donning/doffing brace Outcome: Not Applicable Date Met:  40/08/67 Goal: Understands assist devices with ambulation Outcome: Completed/Met Date Met:  11/14/14 Goal: PT/OT consults completed Outcome: Completed/Met Date Met:  11/14/14 Goal: Other Phase II Outcomes/Goals Outcome: Completed/Met Date Met:   11/14/14

## 2014-11-15 ENCOUNTER — Inpatient Hospital Stay (HOSPITAL_COMMUNITY): Payer: Medicare Other

## 2014-11-15 DIAGNOSIS — M4712 Other spondylosis with myelopathy, cervical region: Secondary | ICD-10-CM | POA: Diagnosis not present

## 2014-11-15 LAB — CBC WITH DIFFERENTIAL/PLATELET
Basophils Absolute: 0 10*3/uL (ref 0.0–0.1)
Basophils Relative: 0 % (ref 0–1)
EOS ABS: 0 10*3/uL (ref 0.0–0.7)
Eosinophils Relative: 0 % (ref 0–5)
HCT: 30.7 % — ABNORMAL LOW (ref 39.0–52.0)
Hemoglobin: 9.9 g/dL — ABNORMAL LOW (ref 13.0–17.0)
Lymphocytes Relative: 9 % — ABNORMAL LOW (ref 12–46)
Lymphs Abs: 0.8 10*3/uL (ref 0.7–4.0)
MCH: 30.7 pg (ref 26.0–34.0)
MCHC: 32.2 g/dL (ref 30.0–36.0)
MCV: 95.3 fL (ref 78.0–100.0)
MONOS PCT: 10 % (ref 3–12)
Monocytes Absolute: 0.9 10*3/uL (ref 0.1–1.0)
Neutro Abs: 7.8 10*3/uL — ABNORMAL HIGH (ref 1.7–7.7)
Neutrophils Relative %: 81 % — ABNORMAL HIGH (ref 43–77)
Platelets: 85 10*3/uL — ABNORMAL LOW (ref 150–400)
RBC: 3.22 MIL/uL — ABNORMAL LOW (ref 4.22–5.81)
RDW: 17.1 % — ABNORMAL HIGH (ref 11.5–15.5)
WBC: 9.6 10*3/uL (ref 4.0–10.5)

## 2014-11-15 LAB — COMPREHENSIVE METABOLIC PANEL
ALK PHOS: 98 U/L (ref 39–117)
ALT: 26 U/L (ref 0–53)
ANION GAP: 11 (ref 5–15)
AST: 50 U/L — ABNORMAL HIGH (ref 0–37)
Albumin: 3.1 g/dL — ABNORMAL LOW (ref 3.5–5.2)
BUN: 26 mg/dL — ABNORMAL HIGH (ref 6–23)
CO2: 23 meq/L (ref 19–32)
Calcium: 8.5 mg/dL (ref 8.4–10.5)
Chloride: 103 mEq/L (ref 96–112)
Creatinine, Ser: 0.83 mg/dL (ref 0.50–1.35)
GFR, EST NON AFRICAN AMERICAN: 89 mL/min — AB (ref >90)
GLUCOSE: 186 mg/dL — AB (ref 70–99)
POTASSIUM: 4.3 meq/L (ref 3.7–5.3)
Sodium: 137 mEq/L (ref 137–147)
TOTAL PROTEIN: 7.5 g/dL (ref 6.0–8.3)
Total Bilirubin: 1.1 mg/dL (ref 0.3–1.2)

## 2014-11-15 LAB — GLUCOSE, CAPILLARY
GLUCOSE-CAPILLARY: 169 mg/dL — AB (ref 70–99)
GLUCOSE-CAPILLARY: 223 mg/dL — AB (ref 70–99)
GLUCOSE-CAPILLARY: 215 mg/dL — AB (ref 70–99)
Glucose-Capillary: 165 mg/dL — ABNORMAL HIGH (ref 70–99)
Glucose-Capillary: 184 mg/dL — ABNORMAL HIGH (ref 70–99)
Glucose-Capillary: 226 mg/dL — ABNORMAL HIGH (ref 70–99)

## 2014-11-15 LAB — TROPONIN I: Troponin I: <0.3 ng/mL (ref <0.30)

## 2014-11-15 MED ORDER — DEXAMETHASONE SODIUM PHOSPHATE 10 MG/ML IJ SOLN
10.0000 mg | Freq: Once | INTRAMUSCULAR | Status: AC
  Administered 2014-11-15: 10 mg via INTRAVENOUS

## 2014-11-15 MED ORDER — METHOCARBAMOL 1000 MG/10ML IJ SOLN
500.0000 mg | Freq: Four times a day (QID) | INTRAMUSCULAR | Status: DC | PRN
Start: 2014-11-15 — End: 2014-11-20
  Filled 2014-11-15: qty 5

## 2014-11-15 MED ORDER — DEXAMETHASONE SODIUM PHOSPHATE 10 MG/ML IJ SOLN
INTRAMUSCULAR | Status: AC
  Filled 2014-11-15: qty 1

## 2014-11-15 NOTE — Progress Notes (Signed)
Subjective: Patient reports appropriate neck soreness, some difficulty with swallowing, no difficulty breathing. No snip and arm pain or numbness tingling or weakness  Objective: Vital signs in last 24 hours: Temp:  [97.5 F (36.4 C)-99.8 F (37.7 C)] 97.6 F (36.4 C) (11/26 0000) Pulse Rate:  [59-72] 72 (11/26 0800) Resp:  [11-20] 17 (11/26 0800) BP: (107-141)/(39-63) 132/59 mmHg (11/26 0800) SpO2:  [92 %-100 %] 96 % (11/26 0800)  Intake/Output from previous day: 11/25 0730 - 11/26 0729 In: 1661.1 [I.V.:1661.1] Out: 1075 [Urine:1075] Intake/Output this shift:    Neurologic: Grossly normal 2 in bed exam, moderate swelling with some tenderness at the surgical site. Difficult to tell if trachea is midline though I think it is.  Lab Results: Lab Results  Component Value Date   WBC 9.6 11/15/2014   HGB 9.9* 11/15/2014   HCT 30.7* 11/15/2014   MCV 95.3 11/15/2014   PLT 85* 11/15/2014   Lab Results  Component Value Date   INR 1.40 11/14/2014   BMET Lab Results  Component Value Date   NA 137 11/15/2014   K 4.3 11/15/2014   CL 103 11/15/2014   CO2 23 11/15/2014   GLUCOSE 186* 11/15/2014   BUN 26* 11/15/2014   CREATININE 0.83 11/15/2014   CALCIUM 8.5 11/15/2014    Studies/Results: Portable Chest Xray  11/14/2014   CLINICAL DATA:  Encounter for intubation  EXAM: PORTABLE CHEST - 1 VIEW  COMPARISON:  10/26/2014  FINDINGS: The ET tube tip is above the carina. There is a nasogastric tube with tip in the stomach. Mild cardiac enlargement. Pulmonary vascular congestion noted. No overt effusion or edema.  IMPRESSION: 1. ET tube tip is above the carina. 2. Cardiac enlargement and pulmonary vascular congestion.   Electronically Signed   By: Kerby Moors M.D.   On: 11/14/2014 00:07   Dg Abd Portable 1v  11/14/2014   CLINICAL DATA:  Encounter for orogastric tube placement  EXAM: PORTABLE ABDOMEN - 1 VIEW  COMPARISON:  None  FINDINGS: The orogastric tube tip is in the distal  stomach. Bowel gas pattern is unremarkable.  IMPRESSION: Tip of orogastric tube is in the distal stomach.   Electronically Signed   By: Kerby Moors M.D.   On: 11/14/2014 00:20    Assessment/Plan: Continue care in ICU for 1 more day. Swallowing evaluation today.   LOS: 2 days    [name] S 11/15/2014, 8:22 AM

## 2014-11-15 NOTE — Evaluation (Signed)
Clinical/Bedside Swallow Evaluation Patient Details  Name: [name] Sr. MRN: [id number] Date of Birth: [birth year]  Today's Date: 11/15/2014 Time: 1209-1226 SLP Time Calculation (min) (ACUTE ONLY): 17 min  Past Medical History:  Past Medical History  Diagnosis Date  . Stroke   . Hypertension   . COPD (chronic obstructive pulmonary disease)   . Diabetes mellitus without complication   . H/O hiatal hernia   . Depression   . Arthritis   . Hepatitis     NASH  . Cirrhosis of liver not due to alcohol    Past Surgical History:  Past Surgical History  Procedure Laterality Date  . Nose surgery    . Appendectomy    . Cardiac catheterization    . Eye surgery      leaglly blind  . Anterior cervical decompression/discectomy fusion 4 levels N/A 11/13/2014    Procedure:  Cervical three-four, Cervical four-five, Cervical five-six, Cervical six- seven anterior cervical decompression with fusion and interbody prosthesis plating and bonegraft;  Surgeon: Consuella Lose, MD;  Location: [facility];  Service: Neurosurgery;  Laterality: N/A;   HPI:  68 year old male presented for surgery 11/24 after prolonged weakness and gait disturbance. Underwent prolonged neurosurgical procedure (ACDF C3-4, 4-5, 5-6, 6-7) and remained intubated post-operatively (11/24-11/25). Pt reports a h/o dysphagia several years ago after TIA, for which he worked with a Astronomer. He does not recall if he required an altered diet.   Assessment / Plan / Recommendation Clinical Impression  Pt shows overt signs of aspiration with thin liquids, including immediate cough and associated brief drop in SpO2. Pt has multiple swallows with all consistencies tested. He appeared to adequately protect his airway with the first few bites of puree, however he then began to have immediate throat clearing which culminated in a delayed, wet cough. Recommend to remain NPO to allow some additional time for swelling to decrease,  allowing a few bites of applesauce PRN to administer meds crushed. SLP to return on next date to assess PO readiness versus need for objective testing.    Aspiration Risk  Severe    Diet Recommendation NPO except meds   Medication Administration: Crushed with puree    Other  Recommendations Oral Care Recommendations: Oral care Q4 per protocol   Follow Up Recommendations   (tbd)    Frequency and Duration min 3x week  1 week   Pertinent Vitals/Pain RN aware of pain in arms, administered medication prior to evaluation. Brief drop in SpO2 as described above.    SLP Swallow Goals     Swallow Study Prior Functional Status       General HPI: 68 year old male presented for surgery 11/24 after prolonged weakness and gait disturbance. Underwent prolonged neurosurgical procedure (ACDF C3-4, 4-5, 5-6, 6-7) and remained intubated post-operatively (11/24-11/25). Pt reports a h/o dysphagia several years ago after TIA, for which he worked with a Astronomer. He does not recall if he required an altered diet. Type of Study: Bedside swallow evaluation Previous Swallow Assessment: none in chart (see HPI) Diet Prior to this Study: NPO Temperature Spikes Noted: Yes Respiratory Status: Nasal cannula History of Recent Intubation: Yes Length of Intubations (days): 1 days Date extubated: 11/14/14 Behavior/Cognition: Alert;Cooperative;Pleasant mood Oral Cavity - Dentition: Edentulous;Dentures, not available Self-Feeding Abilities: Needs assist Patient Positioning: Upright in bed Baseline Vocal Quality: Hoarse Volitional Cough: Strong Volitional Swallow: Able to elicit    Oral/Motor/Sensory Function Overall Oral Motor/Sensory Function: Appears within functional limits for tasks  assessed   Ice Chips Ice chips: Impaired Presentation: Spoon Pharyngeal Phase Impairments: Cough - Immediate   Thin Liquid Thin Liquid: Impaired Presentation: Cup;Self Fed Pharyngeal  Phase Impairments: Cough -  Immediate;Multiple swallows    Nectar Thick Nectar Thick Liquid: Not tested   Honey Thick Honey Thick Liquid: Not tested   Puree Puree: Impaired Presentation: Spoon Pharyngeal Phase Impairments: Multiple swallows;Throat Clearing - Immediate;Cough - Delayed   Solid   GO    Solid: Not tested        Germain Osgood, M.A. CCC-SLP [phone]  Germain Osgood 11/15/2014,12:35 PM

## 2014-11-15 NOTE — Progress Notes (Signed)
Pt c/o neck pain, requests to sit up.  Pt transferred to bedside recliner per stand with 2 assist.  Pt c/o bilat severe upper  arm pain.  VSS.  Pt denies chest pain.  Joya Gaskins, MD CCM and Ronnald Ramp, MD NSU notified.  Joya Gaskins, MD at bedside to assess pt.  Pt transferred back to bed per stand.  Pt medicated with 25 mcg fentanyl IV and 10 mg Decadron IV per orders.  Lateral cervical xray obtained at bedside and read by Dr. Ronnald Ramp, present at bedside.  Stat ECG obtained.  1145 pt continues to c/o bilat severe upper arm pain.  Pt medicated with 25 mcg Fentanyl IV and 2 Oxycodone per prn orders.  1220 pt states pain has decreased to 1/10.

## 2014-11-15 NOTE — Consult Note (Signed)
PULMONARY / CRITICAL CARE MEDICINE   Name: Luke FORE Sr. MRN: [id number] DOB: [birth year]    ADMISSION DATE:  11/13/2014 CONSULTATION DATE:  11/13/2014  REFERRING MD : Dr. Kathyrn Sheriff - Neurosurgery  CHIEF COMPLAINT:  Gait difficulty  INITIAL PRESENTATION: 68 year old male presented for surgery 11/24 after prolonged weakness and gait disturbance. Underwent prolonged neurosurgical procedure and remained intubated post-operatively. PCCM consulted for vent management.   STUDIES:    SIGNIFICANT EVENTS: 11/24 - Discectomy at C3-4, C4-5, C5-6, C6-7 for decompression of spinal cord and exiting nerve roots. Placement of intervertebral biomechanical device, Zimmer PEEK grafts at C3-4, C4-5, C5-6, C6-7. Placement of anterior instrumentation consisting of interbody plate and screws spanning C3-C7. Use of morselized bone allograft. Arthrodesis C3-4, C4-5, C5-6, C6-7, anterior interbody technique  11/25- awake, remains vented  PAST MEDICAL HISTORY :   has a past medical history of Stroke; Hypertension; COPD (chronic obstructive pulmonary disease); Diabetes mellitus without complication; H/O hiatal hernia; Depression; Arthritis; Hepatitis; and Cirrhosis of liver not due to alcohol.  has past surgical history that includes Nose surgery; Appendectomy; Cardiac catheterization; Eye surgery; and Anterior cervical decompression/discectomy fusion 4 level (N/A, 11/13/2014).   SUBJECTIVE:   VITAL SIGNS: Temp:  [97.5 F (36.4 C)-99.8 F (37.7 C)] 97.7 F (36.5 C) (11/26 0800) Pulse Rate:  [59-72] 69 (11/26 1000) Resp:  [11-20] 15 (11/26 1000) BP: (107-141)/(39-66) 129/60 mmHg (11/26 1000) SpO2:  [92 %-100 %] 95 % (11/26 1000) HEMODYNAMICS:   VENTILATOR SETTINGS:   INTAKE / OUTPUT:  Intake/Output Summary (Last 24 hours) at 11/15/14 1104 Last data filed at 11/15/14 1000  Gross per 24 hour  Intake   1800 ml  Output    990 ml  Net    810 ml    PHYSICAL EXAMINATION: General:  Overweight  male in NAD on vent Neuro: Sedated on vent. RASS -1 HEENT: [place]/AT, closed surgical incision to anterior neck. PERRL, No JVD noted. Leaks   Cardiovascular:  RRR, no MRG Lungs:  CTA Abdomen:  Obese, soft, non-distended Musculoskeletal:  No acute deformity. Skin:  Intact  LABS:  CBC  Recent Labs Lab 11/13/14 1549 11/14/14 1152 11/15/14 0220  WBC 4.7 6.5 9.6  HGB 9.7* 9.2* 9.9*  HCT 30.0* 28.7* 30.7*  PLT 77* 69* 85*   Coag's  Recent Labs Lab 11/14/14 1152  APTT 34  INR 1.40   BMET  Recent Labs Lab 11/13/14 0730 11/15/14 0220  NA 140 137  K 4.0 4.3  CL 106 103  CO2 23 23  BUN 12 26*  CREATININE 0.59 0.83  GLUCOSE 216* 186*   Electrolytes  Recent Labs Lab 11/13/14 0730 11/15/14 0220  CALCIUM 8.7 8.5   Sepsis Markers No results for input(s): LATICACIDVEN, PROCALCITON, O2SATVEN in the last 168 hours. ABG  Recent Labs Lab 11/13/14 2340  PHART 7.325*  PCO2ART 45.2*  PO2ART 313.0*   Liver Enzymes  Recent Labs Lab 11/13/14 0730 11/15/14 0220  AST 40* 50*  ALT 24 26  ALKPHOS 122* 98  BILITOT 0.6 1.1  ALBUMIN 3.0* 3.1*   Cardiac Enzymes No results for input(s): TROPONINI, PROBNP in the last 168 hours. Glucose  Recent Labs Lab 11/14/14 1119 11/14/14 1513 11/14/14 1936 11/15/14 0025 11/15/14 0336 11/15/14 0902  GLUCAP 289* 206* 173* 165* 169* 184*    Imaging No results found.  ASSESSMENT / PLAN:  PULMONARY OETT 11/13/2014>>11/25 A: Off vent H/o COPD S/p Anterior fusion  P:   Oxygen titration   CARDIOVASCULAR A:  H/o HTN  P:  Tele Hold PO antihypertensives , re assess BP after precedex off  RENAL A:   No acute issues  P:   Monitor UOP Follow Bmet, continued saline, follow Cl  GASTROINTESTINAL A:   Hepatitis Cirrhosis - non-alcoholic  P:   NPO Protonix  HEMATOLOGIC A:   Anemia Thrombocytopenia Pancytopenia (etiology unclear liver dz?)  P:  Monitor  INFECTIOUS A:   No acute issues  P:    Follow WBC and fever curve  ENDOCRINE A:   DM  P:   CBG monitoring and SSI Avoid any hypoglcyemia If diet started  = lantus start  NEUROLOGIC A:  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

## 2014-11-16 ENCOUNTER — Inpatient Hospital Stay (HOSPITAL_COMMUNITY): Payer: Medicare Other

## 2014-11-16 DIAGNOSIS — J438 Other emphysema: Secondary | ICD-10-CM

## 2014-11-16 DIAGNOSIS — E1165 Type 2 diabetes mellitus with hyperglycemia: Secondary | ICD-10-CM

## 2014-11-16 DIAGNOSIS — R0600 Dyspnea, unspecified: Secondary | ICD-10-CM

## 2014-11-16 LAB — GLUCOSE, CAPILLARY
GLUCOSE-CAPILLARY: 241 mg/dL — AB (ref 70–99)
Glucose-Capillary: 226 mg/dL — ABNORMAL HIGH (ref 70–99)
Glucose-Capillary: 184 mg/dL — ABNORMAL HIGH (ref 70–99)
Glucose-Capillary: 179 mg/dL — ABNORMAL HIGH (ref 70–99)
Glucose-Capillary: 264 mg/dL — ABNORMAL HIGH (ref 70–99)
Glucose-Capillary: 206 mg/dL — ABNORMAL HIGH (ref 70–99)
Glucose-Capillary: 205 mg/dL — ABNORMAL HIGH (ref 70–99)

## 2014-11-16 LAB — BASIC METABOLIC PANEL
Anion gap: 7 (ref 5–15)
BUN: 23 mg/dL (ref 6–23)
CHLORIDE: 105 meq/L (ref 96–112)
CO2: 26 meq/L (ref 19–32)
Calcium: 8.5 mg/dL (ref 8.4–10.5)
Creatinine, Ser: 0.71 mg/dL (ref 0.50–1.35)
GFR calc Af Amer: >90 mL/min (ref >90)
GFR calc non Af Amer: >90 mL/min (ref >90)
GLUCOSE: 249 mg/dL — AB (ref 70–99)
Potassium: 4.5 mEq/L (ref 3.7–5.3)
SODIUM: 138 meq/L (ref 137–147)

## 2014-11-16 LAB — CBC
HEMATOCRIT: 28.7 % — AB (ref 39.0–52.0)
HEMOGLOBIN: 9.2 g/dL — AB (ref 13.0–17.0)
MCH: 30.2 pg (ref 26.0–34.0)
MCHC: 32.1 g/dL (ref 30.0–36.0)
MCV: 94.1 fL (ref 78.0–100.0)
Platelets: 61 10*3/uL — ABNORMAL LOW (ref 150–400)
RBC: 3.05 MIL/uL — AB (ref 4.22–5.81)
RDW: 16.8 % — ABNORMAL HIGH (ref 11.5–15.5)
WBC: 5.7 10*3/uL (ref 4.0–10.5)

## 2014-11-16 MED ORDER — OXYCODONE HCL 5 MG PO TABS
10.0000 mg | ORAL_TABLET | ORAL | Status: DC | PRN
  Administered 2014-11-16 – 2014-11-20 (×4): 10 mg via ORAL
  Filled 2014-11-16 (×4): qty 2

## 2014-11-16 MED ORDER — ACETAMINOPHEN 10 MG/ML IV SOLN
1000.0000 mg | Freq: Four times a day (QID) | INTRAVENOUS | Status: AC
Start: 2014-11-16 — End: 2014-11-17
  Administered 2014-11-16 – 2014-11-17 (×4): 1000 mg via INTRAVENOUS
  Filled 2014-11-16 (×4): qty 100

## 2014-11-16 MED ORDER — DEXAMETHASONE SODIUM PHOSPHATE 4 MG/ML IJ SOLN
4.0000 mg | Freq: Four times a day (QID) | INTRAMUSCULAR | Status: AC
  Administered 2014-11-16 – 2014-11-17 (×4): 4 mg via INTRAVENOUS
  Filled 2014-11-16 (×4): qty 1

## 2014-11-16 NOTE — Progress Notes (Signed)
Subjective: Patient reports considerable pain in his shoulders and arms. He denies numbness or tingling or focal weakness. The pain seems to be severe. Still some trouble swallowing.  Objective: Vital signs in last 24 hours: Temp:  [97.7 F (36.5 C)-98.8 F (37.1 C)] 98.8 F (37.1 C) (11/27 0800) Pulse Rate:  [66-89] 89 (11/27 0700) Resp:  [11-18] 14 (11/27 0700) BP: (111-156)/(45-71) 140/65 mmHg (11/27 0700) SpO2:  [92 %-98 %] 96 % (11/27 0700)  Intake/Output from previous day: 11/26 0730 - 11/27 0729 In: 1803 [I.V.:1803] Out: 1665 [Urine:1665] Intake/Output this shift:    He is awake and alert, he seems to be miserable, he is moving his arms and legs well. His left grip may be a little weaker than his right grip. I do not know his baseline. This appears similar to yesterday's exam. He seems to have good strength elsewhere in the upper extremities, though it is somewhat limited by pain. He lifts his arms over his head easily. There is no change in the swelling in the cervical spine at the surgical site though there is more bruising. No change in his voice.  Lab Results: Lab Results  Component Value Date   WBC 5.7 11/16/2014   HGB 9.2* 11/16/2014   HCT 28.7* 11/16/2014   MCV 94.1 11/16/2014   PLT 61* 11/16/2014   Lab Results  Component Value Date   INR 1.40 11/14/2014   BMET Lab Results  Component Value Date   NA 138 11/16/2014   K 4.5 11/16/2014   CL 105 11/16/2014   CO2 26 11/16/2014   GLUCOSE 249* 11/16/2014   BUN 23 11/16/2014   CREATININE 0.71 11/16/2014   CALCIUM 8.5 11/16/2014    Studies/Results: Dg Cervical Spine 1 View  11/15/2014   CLINICAL DATA:  Postop C3-C7 anterior cervical discectomy and fusion. Difficulty swallowing.  EXAM: DG CERVICAL SPINE - 1 VIEW  COMPARISON:  07/04/2014 cervical MRI  FINDINGS: Patient has undergone 4 level anterior cervical discectomy and fusion spanning C3-C7. Grossly anatomic alignment. Marked prevertebral soft tissue  swelling diffusely measuring up to 26 mm at the C3 level.  IMPRESSION: Prevertebral soft tissue swelling diffusely  Status post C3-C7 anterior cervical discectomy and fusion   Electronically Signed   By: Daryll Brod M.D.   On: 11/15/2014 11:42    Assessment/Plan: Continued considerable pain in the shoulders and arms without obvious neurologic deficit. I suspect this is radiculitis and will put him on steroids. I did a plain film yesterday and the hardware looked okay. I A considered ordering a CT scan or an MRI of the cervical spine for today but given his lack of neurologic deficit I'm not sure is absolutely necessary. We'll continue to monitor for now.   LOS: 3 days    Daphane Odekirk S 11/16/2014, 9:29 AM

## 2014-11-16 NOTE — Progress Notes (Addendum)
PULMONARY / CRITICAL CARE MEDICINE   Name: Luke ROCCA Sr. MRN: [id number] DOB: [birth year]    ADMISSION DATE:  11/13/2014 CONSULTATION DATE:  11/13/2014  REFERRING MD : Dr. Kathyrn Sheriff - Neurosurgery  CHIEF COMPLAINT:  Gait difficulty  INITIAL PRESENTATION: 68 year old male presented for surgery 11/24 after prolonged weakness and gait disturbance. Underwent prolonged neurosurgical procedure and remained intubated post-operatively. PCCM consulted for vent management.   STUDIES:    SIGNIFICANT EVENTS: 11/24 - Discectomy at C3-4, C4-5, C5-6, C6-7 for decompression of spinal cord and exiting nerve roots. Placement of intervertebral biomechanical device, Zimmer PEEK grafts at C3-4, C4-5, C5-6, C6-7. Placement of anterior instrumentation consisting of interbody plate and screws spanning C3-C7. Use of morselized bone allograft. Arthrodesis C3-4, C4-5, C5-6, C6-7, anterior interbody technique  11/25- extubated   SUBJECTIVE: afebrile Agitated this am, 'cannot breathe'  Denies CP  VITAL SIGNS: Temp:  [97.7 F (36.5 C)-98.8 F (37.1 C)] 98.8 F (37.1 C) (11/27 0800) Pulse Rate:  [66-89] 89 (11/27 0700) Resp:  [11-18] 14 (11/27 0700) BP: (111-156)/(45-71) 140/65 mmHg (11/27 0700) SpO2:  [92 %-98 %] 96 % (11/27 0700) HEMODYNAMICS:   VENTILATOR SETTINGS:   INTAKE / OUTPUT:  Intake/Output Summary (Last 24 hours) at 11/16/14 0957 Last data filed at 11/16/14 0700  Gross per 24 hour  Intake   1653 ml  Output   1665 ml  Net    -12 ml    PHYSICAL EXAMINATION: General:  Overweight male in NAD on vent Neuro: awake, follows commands , power 3+/5 in all 4 Es HEENT: [place]/AT, closed surgical incision to anterior neck. PERRL, No JVD noted. No stridor Cardiovascular:  RRR, no MRG Lungs:  CTA Abdomen:  Obese, soft, non-distended Musculoskeletal:  No acute deformity. Skin:  Intact  LABS:  CBC  Recent Labs Lab 11/14/14 1152 11/15/14 0220 11/16/14 0215  WBC 6.5 9.6 5.7  HGB 9.2*  9.9* 9.2*  HCT 28.7* 30.7* 28.7*  PLT 69* 85* 61*   Coag's  Recent Labs Lab 11/14/14 1152  APTT 34  INR 1.40   BMET  Recent Labs Lab 11/13/14 0730 11/15/14 0220 11/16/14 0215  NA 140 137 138  K 4.0 4.3 4.5  CL 106 103 105  CO2 23 23 26   BUN 12 26* 23  CREATININE 0.59 0.83 0.71  GLUCOSE 216* 186* 249*   Electrolytes  Recent Labs Lab 11/13/14 0730 11/15/14 0220 11/16/14 0215  CALCIUM 8.7 8.5 8.5   Sepsis Markers No results for input(s): LATICACIDVEN, PROCALCITON, O2SATVEN in the last 168 hours. ABG  Recent Labs Lab 11/13/14 2340  PHART 7.325*  PCO2ART 45.2*  PO2ART 313.0*   Liver Enzymes  Recent Labs Lab 11/13/14 0730 11/15/14 0220  AST 40* 50*  ALT 24 26  ALKPHOS 122* 98  BILITOT 0.6 1.1  ALBUMIN 3.0* 3.1*   Cardiac Enzymes  Recent Labs Lab 11/15/14 1214  TROPONINI <0.30   Glucose  Recent Labs Lab 11/15/14 1332 11/15/14 1610 11/15/14 1943 11/15/14 2347 11/16/14 0346 11/16/14 0856  GLUCAP 226* 223* 215* 241* 226* 184*    Imaging Dg Cervical Spine 1 View  11/15/2014   CLINICAL DATA:  Postop C3-C7 anterior cervical discectomy and fusion. Difficulty swallowing.  EXAM: DG CERVICAL SPINE - 1 VIEW  COMPARISON:  07/04/2014 cervical MRI  FINDINGS: Patient has undergone 4 level anterior cervical discectomy and fusion spanning C3-C7. Grossly anatomic alignment. Marked prevertebral soft tissue swelling diffusely measuring up to 26 mm at the C3 level.  IMPRESSION: Prevertebral soft tissue swelling  diffusely  Status post C3-C7 anterior cervical discectomy and fusion   Electronically Signed   By: Daryll Brod M.D.   On: 11/15/2014 11:42    ASSESSMENT / PLAN:  PULMONARY OETT 11/13/2014>>11/25 A: H/o COPD S/p Anterior fusion  P:   Oxygen  No stridor or wheeze to suggest airway compromise   CARDIOVASCULAR A:  H/o HTN  P:  Tele resume PO antihypertensives when able to take PO  RENAL A:   No acute issues  P:   Monitor  UOP Follow Bmet  GASTROINTESTINAL A:   Hepatitis Cirrhosis - non-alcoholic  P:   NPO until swallow Protonix Resume lactulose, rifaximin once clears swallow  HEMATOLOGIC A:   Anemia Thrombocytopenia Pancytopenia (etiology unclear liver dz?)  P:  Monitor  INFECTIOUS A:   No acute issues  P:   Follow WBC and fever curve  ENDOCRINE A:   DM  P:   CBG monitoring and SSI Avoid any hypoglcyemia If diet started  = lantus start  NEUROLOGIC A:  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

## 2014-11-16 NOTE — Progress Notes (Signed)
Physical Therapy Treatment Patient Details Name: Luke FALCK Sr. MRN: [id number] DOB: 06-18-1946 Today's Date: 11/16/2014    History of Present Illness pt presents with C3-7 ACDF intubated 11/24- 11/25.      PT Comments    Patient with increased UE weakness and imbalance this session.  Feel would not be safe to return home with wife assist.  Recommend CIR for continued rehab post hospitalization for maximized safety and mobility prior to d/c home.  Follow Up Recommendations  CIR     Equipment Recommendations  None recommended by PT    Recommendations for Other Services OT consult     Precautions / Restrictions Precautions Precautions: Fall;Cervical Precaution Comments: Aspen collar in room on b/s table; applied for out of bed though no further order noted. Required Braces or Orthoses: Cervical Brace Cervical Brace: Hard collar    Mobility  Bed Mobility Overal bed mobility: Needs Assistance Bed Mobility: Supine to Sit;Sit to Supine     Supine to sit: Mod assist Sit to supine: Min assist   General bed mobility comments: heavy assist to lift trunk; attempted through sidelying and patient unable  Transfers Overall transfer level: Needs assistance Equipment used: Rolling walker (2 wheeled) Transfers: Sit to/from Stand Sit to Stand: Mod assist;Min assist         General transfer comment: anterior bias and needs assist standing to urinate  Ambulation/Gait Ambulation/Gait assistance: Mod assist Ambulation Distance (Feet): 80 Feet Assistive device: Rolling walker (2 wheeled) Gait Pattern/deviations: Step-through pattern;Trunk flexed;Decreased stride length     General Gait Details: anterior bias, difficulty managing walker due to UE weakness; very high risk for falls   Stairs            Wheelchair Mobility    Modified Rankin (Stroke Patients Only)       Balance Overall balance assessment: Needs assistance   Sitting balance-Leahy Scale:  Poor Sitting balance - Comments: anterior bias   Standing balance support: Bilateral upper extremity supported Standing balance-Leahy Scale: Poor Standing balance comment: weak LE's and UE's; poor postural control; assist to use urinal in standing and needs support to prevent anterior loss of balance                    Cognition Arousal/Alertness: Awake/alert Behavior During Therapy: WFL for tasks assessed/performed Overall Cognitive Status: No family/caregiver present to determine baseline cognitive functioning                      Exercises      General Comments        Pertinent Vitals/Pain Pain Assessment: Faces Faces Pain Scale: Hurts little more Pain Location: neck, arms Pain Intervention(s): Monitored during session    Home Living                      Prior Function            PT Goals (current goals can now be found in the care plan section) Progress towards PT goals: Not progressing toward goals - comment (due to weakness; imbalance compared to last session)    Frequency  Min 4X/week    PT Plan Discharge plan needs to be updated;Frequency needs to be updated    Co-evaluation             End of Session Equipment Utilized During Treatment: Gait belt;Oxygen Activity Tolerance: Patient limited by fatigue Patient left: in bed;with call bell/phone within reach  Time: 1551-1620 PT Time Calculation (min) (ACUTE ONLY): 29 min  Charges:  $Gait Training: 8-22 mins $Therapeutic Activity: 8-22 mins                    G Codes:      Karima Carrell,CYNDI 2014-12-02, 5:13 PM Magda Kiel, [place] 12-02-14

## 2014-11-16 NOTE — Progress Notes (Signed)
Speech Language Pathology Treatment: Dysphagia  Patient Details Name: Luke MITCHUM Sr. MRN: [id number] DOB: 1946-07-28 Today's Date: 11/16/2014 Time: 9458-5929 SLP Time Calculation (min) (ACUTE ONLY): 16 min  Assessment / Plan / Recommendation Clinical Impression  Patient with swallowing function consistent with initial evaluation 11/26 with continued evidence of decreased airway protection (liquids > puree solids) with clinician presented po trials despite manipulation of bolus size and presentation. Patient with spontaneous throat clearing and/or coughing which was successful in clearing vocal quality. Given better appearing ability to protect airway with pureed solids and what appears to be intact sensation of aspirates, recommend proceeding with instrumental testing to determine ability to initiate a conservative po diet. Plan for MBS this pm.    HPI HPI: 68 year old male presented for surgery 11/24 after prolonged weakness and gait disturbance. Underwent prolonged neurosurgical procedure (ACDF C3-4, 4-5, 5-6, 6-7) and remained intubated post-operatively (11/24-11/25). Pt reports a h/o dysphagia several years ago after TIA, for which he worked with a Astronomer. He does not recall if he required an altered diet.   Pertinent Vitals Pain Assessment: 0-10 Pain Score: 10-Worst pain ever Pain Location: neck Pain Descriptors / Indicators: Aching Pain Intervention(s): Patient requesting pain meds-RN notified  SLP Plan  MBS    Recommendations Diet recommendations: NPO Medication Administration: Crushed with puree              Oral Care Recommendations:  (QID) Follow up Recommendations:  (tbd) Plan: MBS    GO   [name] [place], CCC-SLP [phone]   [name] [name] 11/16/2014, 8:34 AM

## 2014-11-16 NOTE — Procedures (Addendum)
Objective Swallowing Evaluation: Modified Barium Swallowing Study  Patient Details  Name: Luke WALLS Sr. MRN: [id number] Date of Birth: 1946/01/24  Today's Date: 11/16/2014 Time: 1430-1455 SLP Time Calculation (min) (ACUTE ONLY): 25 min  Past Medical History:  Past Medical History  Diagnosis Date  . Stroke   . Hypertension   . COPD (chronic obstructive pulmonary disease)   . Diabetes mellitus without complication   . H/O hiatal hernia   . Depression   . Arthritis   . Hepatitis     NASH  . Cirrhosis of liver not due to alcohol    Past Surgical History:  Past Surgical History  Procedure Laterality Date  . Nose surgery    . Appendectomy    . Cardiac catheterization    . Eye surgery      leaglly blind  . Anterior cervical decompression/discectomy fusion 4 levels N/A 11/13/2014    Procedure:  Cervical three-four, Cervical four-five, Cervical five-six, Cervical six- seven anterior cervical decompression with fusion and interbody prosthesis plating and bonegraft;  Surgeon: Consuella Lose, MD;  Location: [facility];  Service: Neurosurgery;  Laterality: N/A;   HPI:  68 year old male presented for surgery 11/24 after prolonged weakness and gait disturbance. Underwent prolonged neurosurgical procedure (ACDF C3-4, 4-5, 5-6, 6-7) and remained intubated post-operatively (11/24-11/25). Pt reports a h/o dysphagia several years ago after TIA, for which he worked with a Astronomer. He does not recall if he required an altered diet.     Assessment / Plan / Recommendation Clinical Impression  Dysphagia Diagnosis: Severe pharyngeal phase dysphagia;Moderate cervical esophageal phase dysphagia Clinical impression: The patient presents with a pharyngeal/ cervical esophageal phase dysphagia. Posterior pharyngeal wall swelling d/t ACDF is resulting in reduced epiglottic inversion, reduced hyolaryngeal excursion, and reduced opening of CP segment and bolus being directed anteriorly  toward airway. As a result, the pt aspirated of all trials of thin and nectar thick liquids- pt was able to clear most material with strong cough. The pt did not aspirate puree but there was significant residue remaining in the valleculae, despite multiple swallows. Chin tuck was attempted but ineffective in increasing CP segment opening. The pt is at high risk of aspiration across consistencies. Recommend remaining strictly NPO, meds via alternative means. ST will continue to follow for PO trials. Likely that swallow function will improve with time as swelling from ACDF is reduced.     Treatment Recommendation  Therapy as outlined in treatment plan below    Diet Recommendation NPO   Medication Administration: Via alternative means    Other  Recommendations Oral Care Recommendations: Oral care Q4 per protocol   Follow Up Recommendations  Other (comment) (tbd)    Frequency and Duration min 3x week  1 week   Pertinent Vitals/Pain n/a    SLP Swallow Goals     General HPI: 68 year old male presented for surgery 11/24 after prolonged weakness and gait disturbance. Underwent prolonged neurosurgical procedure (ACDF C3-4, 4-5, 5-6, 6-7) and remained intubated post-operatively (11/24-11/25). Pt reports a h/o dysphagia several years ago after TIA, for which he worked with a Astronomer. He does not recall if he required an altered diet. Type of Study: Modified Barium Swallowing Study Reason for Referral: Objectively evaluate swallowing function Previous Swallow Assessment: BSE 11/26 Diet Prior to this Study: NPO Temperature Spikes Noted: No Respiratory Status: Nasal cannula History of Recent Intubation: Yes Length of Intubations (days): 1 days Date extubated: 11/14/14 Behavior/Cognition: Alert;Cooperative Oral Cavity - Dentition: Edentulous;Dentures,  not available Self-Feeding Abilities: Needs assist Patient Positioning: Upright in chair Anatomy: Other (Comment) (swelling of posterior  pharyngeal wall) Pharyngeal Secretions: Not observed secondary MBS    Reason for Referral Objectively evaluate swallowing function   Oral Phase Oral Preparation/Oral Phase Oral Phase: WFL   Pharyngeal Phase Pharyngeal Phase Pharyngeal Phase: Impaired Pharyngeal - Nectar Pharyngeal - Nectar Cup: Penetration/Aspiration during swallow;Pharyngeal residue - valleculae;Pharyngeal residue - pyriform sinuses;Pharyngeal residue - cp segment;Pharyngeal residue - posterior pharnyx Penetration/Aspiration details (nectar cup): Material enters airway, passes BELOW cords then ejected out Pharyngeal - Thin Pharyngeal - Thin Cup: Penetration/Aspiration during swallow;Pharyngeal residue - valleculae;Pharyngeal residue - pyriform sinuses;Pharyngeal residue - posterior pharnyx;Pharyngeal residue - cp segment;Compensatory strategies attempted (Comment) (chin tuck- ineffective) Penetration/Aspiration details (thin cup): Material enters airway, passes BELOW cords then ejected out Pharyngeal - Solids Pharyngeal - Puree: Pharyngeal residue - valleculae  Cervical Esophageal Phase    GO    Cervical Esophageal Phase Cervical Esophageal Phase: Impaired Cervical Esophageal Phase - Nectar Nectar Cup: Reduced cricopharyngeal relaxation Cervical Esophageal Phase - Thin Thin Cup: Reduced cricopharyngeal relaxation Cervical Esophageal Phase - Solids Puree: Reduced cricopharyngeal relaxation Cervical Esophageal Phase - Comment Cervical Esophageal Comment: reduced opening d/t pharyngeal wall swelling         Kern Reap, MA, CCC-SLP 11/16/2014, 3:20 PM

## 2014-11-17 DIAGNOSIS — E118 Type 2 diabetes mellitus with unspecified complications: Secondary | ICD-10-CM

## 2014-11-17 LAB — BASIC METABOLIC PANEL
ANION GAP: 9 (ref 5–15)
BUN: 25 mg/dL — ABNORMAL HIGH (ref 6–23)
CO2: 26 meq/L (ref 19–32)
CREATININE: 0.69 mg/dL (ref 0.50–1.35)
Calcium: 8.8 mg/dL (ref 8.4–10.5)
Chloride: 105 mEq/L (ref 96–112)
GFR calc Af Amer: >90 mL/min (ref >90)
GFR calc non Af Amer: >90 mL/min (ref >90)
Glucose, Bld: 231 mg/dL — ABNORMAL HIGH (ref 70–99)
Potassium: 4.6 mEq/L (ref 3.7–5.3)
Sodium: 140 mEq/L (ref 137–147)

## 2014-11-17 LAB — TYPE AND SCREEN
ABO/RH(D): AB POS
ANTIBODY SCREEN: NEGATIVE
UNIT DIVISION: 0
Unit division: 0
Unit division: 0
Unit division: 0

## 2014-11-17 LAB — GLUCOSE, CAPILLARY
GLUCOSE-CAPILLARY: 207 mg/dL — AB (ref 70–99)
Glucose-Capillary: 201 mg/dL — ABNORMAL HIGH (ref 70–99)
Glucose-Capillary: 198 mg/dL — ABNORMAL HIGH (ref 70–99)
Glucose-Capillary: 235 mg/dL — ABNORMAL HIGH (ref 70–99)
Glucose-Capillary: 223 mg/dL — ABNORMAL HIGH (ref 70–99)

## 2014-11-17 LAB — CBC
HEMATOCRIT: 29.1 % — AB (ref 39.0–52.0)
HEMOGLOBIN: 9.4 g/dL — AB (ref 13.0–17.0)
MCH: 30.9 pg (ref 26.0–34.0)
MCHC: 32.3 g/dL (ref 30.0–36.0)
MCV: 95.7 fL (ref 78.0–100.0)
Platelets: 54 10*3/uL — ABNORMAL LOW (ref 150–400)
RBC: 3.04 MIL/uL — ABNORMAL LOW (ref 4.22–5.81)
RDW: 16.5 % — ABNORMAL HIGH (ref 11.5–15.5)
WBC: 4.5 10*3/uL (ref 4.0–10.5)

## 2014-11-17 NOTE — Progress Notes (Signed)
Doing better today. Pain much better controlled. Swallowing better. No new issues or problems. Patient states this upper extremities feel stable.  Awake and alert. Oriented and appropriate. Speech is clear. Neck wound soft. Airway midline. No respiratory distress. Motor and sensory function of the extremities stable.  Progressing following 4 level anterior cervical decompression and fusion. Continue IV steroids. Continue ICU observation.

## 2014-11-17 NOTE — Plan of Care (Signed)
Problem: SLP Dysphagia Goals Goal: Patient will demonstrate readiness for PO's Patient will demonstrate readiness for PO's and/or instrumental swallow study as evidenced by:  Outcome: Progressing

## 2014-11-17 NOTE — Progress Notes (Signed)
Physical Therapy Treatment Patient Details Name: Luke KEHOE Sr. MRN: [id number] DOB: April 18, 1946 Today's Date: 11/17/2014    History of Present Illness pt presents with C3-7 ACDF intubated 11/24- 11/25.      PT Comments    Pt pleasant with decreased grip strength bil and decreased fine motor coordination bil hands with inability to perform finger to thumb. Pt running into objects, unaware of precautions and needs assist for all mobility. Will continue to follow to maximize function. Pt maintaining 95% on RA throughout.   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 Required Braces or Orthoses: Cervical Brace Cervical Brace: Hard collar;At all times Restrictions Weight Bearing Restrictions: No    Mobility  Bed Mobility Overal bed mobility: Needs Assistance Bed Mobility: Rolling;Sidelying to Sit Rolling: Mod assist Sidelying to sit: Mod assist       General bed mobility comments: cues for sequence with assist to rotate body and elevate trunk from surface with multimodal cues  Transfers Overall transfer level: Needs assistance   Transfers: Sit to/from Stand Sit to Stand: Mod assist;Min assist         General transfer comment: cues for hand placement with assist to stand and mod assist to control descent to surface  Ambulation/Gait Ambulation/Gait assistance: Mod assist;+2 safety/equipment Ambulation Distance (Feet): 150 Feet Assistive device: Rolling walker (2 wheeled) Gait Pattern/deviations: Step-through pattern;Decreased stride length;Drifts right/left   Gait velocity interpretation: Below normal speed for age/gender General Gait Details: pt with difficulty maintaining grasp on RW with bil UE, drifting bil with running into  objects multiple times throughout with gait speed too quick for pt safety with continued cues for steering RW as pt not attempting to scan his environment with limited vision   Stairs            Wheelchair Mobility    Modified Rankin (Stroke Patients Only)       Balance Overall balance assessment: Needs assistance   Sitting balance-Leahy Scale: Fair       Standing balance-Leahy Scale: Poor                      Cognition Arousal/Alertness: Awake/alert Behavior During Therapy: WFL for tasks assessed/performed Overall Cognitive Status: Impaired/Different from baseline Area of Impairment: Orientation;Safety/judgement Orientation Level: Time   Memory: Decreased recall of precautions   Safety/Judgement: Decreased awareness of safety          Exercises General Exercises - Lower Extremity Long Arc Quad: AROM;Seated;Both;15 reps Hip Flexion/Marching: AROM;Seated;Both;15 reps    General Comments        Pertinent Vitals/Pain Pain Assessment: No/denies pain    Home Living                      Prior Function            PT Goals (current goals can now be found in the care plan section) Progress towards PT goals: Progressing toward goals    Frequency       PT Plan Current plan remains appropriate    Co-evaluation             End of Session Equipment Utilized During Treatment: Gait belt Activity Tolerance: Patient tolerated treatment well Patient left: in chair;with call bell/phone  within reach;with family/visitor present     Time: 0902-0928 PT Time Calculation (min) (ACUTE ONLY): 26 min  Charges:  $Gait Training: 8-22 mins $Therapeutic Activity: 8-22 mins                    G Codes:      Melford Aase 2014-12-03, 9:36 AM Elwyn Reach, [place]

## 2014-11-17 NOTE — Progress Notes (Signed)
PULMONARY / CRITICAL CARE MEDICINE   Name: Luke STROZIER Sr. MRN: [id number] DOB: August 29, 1946    ADMISSION DATE:  11/13/2014 CONSULTATION DATE:  11/13/2014  REFERRING MD : Dr. Kathyrn Sheriff - Neurosurgery  CHIEF COMPLAINT:  Gait difficulty  INITIAL PRESENTATION: 68 year old male presented for surgery 11/24 after prolonged weakness and gait disturbance. Underwent prolonged neurosurgical procedure and remained intubated post-operatively. PCCM consulted for vent management.   STUDIES:    SIGNIFICANT EVENTS: 11/24 - Discectomy at C3-4, C4-5, C5-6, C6-7 for decompression of spinal cord and exiting nerve roots. Placement of intervertebral biomechanical device, Zimmer PEEK grafts at C3-4, C4-5, C5-6, C6-7. Placement of anterior instrumentation consisting of interbody plate and screws spanning C3-C7. Use of morselized bone allograft. Arthrodesis C3-4, C4-5, C5-6, C6-7, anterior interbody technique  11/25- extubated   SUBJECTIVE:  No new complaints. No distress. Comfortable on RA  VITAL SIGNS: Temp:  [97.9 F (36.6 C)-99 F (37.2 C)] 97.9 F (36.6 C) (11/28 1144) Pulse Rate:  [64-94] 80 (11/28 1000) Resp:  [8-25] 25 (11/28 1000) BP: (132-161)/(54-126) 149/61 mmHg (11/28 1000) SpO2:  [94 %-100 %] 96 % (11/28 1000) HEMODYNAMICS:   VENTILATOR SETTINGS:   INTAKE / OUTPUT:  Intake/Output Summary (Last 24 hours) at 11/17/14 1213 Last data filed at 11/17/14 1000  Gross per 24 hour  Intake   1950 ml  Output   2700 ml  Net   -750 ml    PHYSICAL EXAMINATION: General:  NAD Neuro: awake, follows commands , power 3+/5 in all 4 Es HEENT: C collar Cardiovascular:  RRR, no M Lungs:  Clear Abdomen:  Obese, soft, non-distended Ext: no edema  LABS: I have reviewed all of today's lab results. Relevant abnormalities are discussed in the A/P section  CXR:  NNF  ASSESSMENT / PLAN:  PULMONARY OETT 11/13/2014>>11/25 A: H/o COPD S/p Anterior fusion P:   PRN O2 to maintain SpO2 >  92% Cont PRN albuterol  CARDIOVASCULAR A:  H/o HTN, controlled P:  Tele resume PO antihypertensives when able to take PO   RENAL A:   No acute issues P:   Monitor UOP Follow Bmet  GASTROINTESTINAL A:   Cirrhosis - non-alcoholic H/O hiatal hernia, chronic PPI use P:   NPO until swallow eval Cont IV Protonix. Change to PO when able to take POs Resume lactulose, rifaximin once clears swallow eval  HEMATOLOGIC A:   Anemia Thrombocytopenia P:  DVT px: SCDs Monitor CBC intermittently Transfuse per usual ICU guidelines  INFECTIOUS A:   No acute issues P:   Follow WBC and fever curve  ENDOCRINE A:   DM 2 exacerbated by systemic steroids P:   Cont CBG monitoring and SSI Once starts diet, initiate Lantus @ 50 units daily (takes 80 units daily PTA) Once diet starts, transition to ACHS SSI  NEUROLOGIC A:  S/p C spine surgery P:   Post op mgmt per NS  He is ready for transfer to CIR at any time from PCCM perspective PCCM will sign off. Please call if we can be of further assistance   Merton Border, MD ; [facility] [phone].  After 5:30 PM or weekends, call [phone]

## 2014-11-17 NOTE — Progress Notes (Signed)
Speech Language Pathology Treatment: Dysphagia  Patient Details Name: Luke SCHUBRING Sr. MRN: [id number] DOB: [birth year] Today's Date: 11/17/2014 Time: 6962-9528 SLP Time Calculation (min) (ACUTE ONLY): 18 min  Assessment / Plan / Recommendation Clinical Impression  Pt demonstrates decreased evidence of aspiration with trials of PO at bedside today. SLP provided ice chips progressing to thin liquids and then puree. Pt does have multiple swallows, but did not have cough, throat clear or wet vocal quality. Hopeful that this is a sign of decreased edema and improved airway protection. Will gently progress pt today, he may have ice chips prn and meds crushed in puree. Will visit pt tomorrow for diet advancement or repeat testing as needed.    HPI HPI: 68 year old male presented for surgery 11/24 after prolonged weakness and gait disturbance. Underwent prolonged neurosurgical procedure (ACDF C3-4, 4-5, 5-6, 6-7) and remained intubated post-operatively (11/24-11/25). Pt reports a h/o dysphagia several years ago after TIA, for which he worked with a Astronomer. He does not recall if he required an altered diet.   Pertinent Vitals Pain Assessment: No/denies pain  SLP Plan  Continue with current plan of care    Recommendations Diet recommendations: Other(comment) (ice chips and meds crushed in puree. ) Medication Administration: Crushed with puree              Oral Care Recommendations: Oral care Q4 per protocol Plan: Continue with current plan of care    GO    [facility], [place] CCC-SLP [phone]  Lynann Beaver 11/17/2014, 11:43 AM

## 2014-11-18 LAB — GLUCOSE, CAPILLARY
GLUCOSE-CAPILLARY: 147 mg/dL — AB (ref 70–99)
Glucose-Capillary: 226 mg/dL — ABNORMAL HIGH (ref 70–99)
Glucose-Capillary: 134 mg/dL — ABNORMAL HIGH (ref 70–99)
Glucose-Capillary: 167 mg/dL — ABNORMAL HIGH (ref 70–99)
Glucose-Capillary: 268 mg/dL — ABNORMAL HIGH (ref 70–99)
Glucose-Capillary: 123 mg/dL — ABNORMAL HIGH (ref 70–99)

## 2014-11-18 NOTE — Progress Notes (Signed)
Speech Language Pathology Treatment: Dysphagia  Patient Details Name: Luke ADKISON Sr. MRN: [id number] DOB: 09/15/1946 Today's Date: 11/18/2014 Time: 2671-2458 SLP Time Calculation (min) (ACUTE ONLY): 14 min  Assessment / Plan / Recommendation Clinical Impression  Pt continues to show improvements at bedside. Multiple swallows are still noted, suspect due to lingering edema and surgical hardware encroaching no pharyngeal space; however, immediate coughing is only observed after large sips of thin liquid. SLP provided Min verbal cues for decreased bolus size and rate to decrease aspiration risk. Recommend to proceed with Dys 1 diet and thin liquids. Will continue to follow for tolerance and liberalization of diet as appropriate.   HPI HPI: 68 year old male presented for surgery 11/24 after prolonged weakness and gait disturbance. Underwent prolonged neurosurgical procedure (ACDF C3-4, 4-5, 5-6, 6-7) and remained intubated post-operatively (11/24-11/25). Pt reports a h/o dysphagia several years ago after TIA, for which he worked with a Astronomer. He does not recall if he required an altered diet.   Pertinent Vitals Pain Assessment: No/denies pain  SLP Plan  Goals updated    Recommendations Diet recommendations: Dysphagia 1 (puree);Thin liquid Liquids provided via: Cup;Straw Medication Administration: Crushed with puree Supervision: Patient able to self feed;Full supervision/cueing for compensatory strategies Compensations: Slow rate;Small sips/bites;Follow solids with liquid Postural Changes and/or Swallow Maneuvers: Seated upright 90 degrees;Upright 30-60 min after meal              Oral Care Recommendations: Oral care BID Follow up Recommendations: Inpatient Rehab Plan: Goals updated    GO      Germain Osgood, M.A. CCC-SLP [phone]  Germain Osgood 11/18/2014, 1:41 PM

## 2014-11-18 NOTE — Progress Notes (Signed)
Report called to receiving RN on  [place]. VSS. Transferred to 4B83 via wheelchair with personal belongings. Notified patients son of new room number.   [facility]

## 2014-11-18 NOTE — Progress Notes (Signed)
Patient's family called RN to informed her patient's ring is missing. Patient was transferred from [facility] without a ring. Patient stated he had the ring in short stay before surgery and "someone gave me a bag to place the ring and put the ring in another bag". Rn called [facility] for input about the ring and [facility] unformed RN they are npt aware of any ring. Security was also paged and they have no record of any ring in their department. RN asked patient's spouse about patient's clothing; the one he wore on 11/13/2014. Spouse stated "I left the bag in my car and my car broke down-it's at the mechanic". RN informed patient's spouse to check the bag and update the unit tomorrow. Will follow up Tomma Rakers RN

## 2014-11-18 NOTE — Plan of Care (Signed)
Problem: SLP Dysphagia Goals Goal: Patient will demonstrate readiness for PO's Patient will demonstrate readiness for PO's and/or instrumental swallow study as evidenced by:  Outcome: Completed/Met Date Met:  11/18/14  Comments:  PO diet initiated

## 2014-11-18 NOTE — Progress Notes (Signed)
Patient arrived to unit via wheelchair, alert and oriented, vitals taken, oriented to room, call bell within reach, bed alarm program. RN will monitor patient. Tomma Rakers RN

## 2014-11-18 NOTE — Progress Notes (Signed)
Continues to do some better. Minimal pain. No difficulty swallowing currently. Feels ready to try a diet. Upper and lower extremity strength and sensation stable.  Afebrile. Vital stable. Neck wound soft. Airway midline. Motor and sensory exam stable. Chest and abdomen benign.  Overall progressing well. We'll transfer to floor. Begin further efforts at mobilization.

## 2014-11-19 LAB — GLUCOSE, CAPILLARY
GLUCOSE-CAPILLARY: 156 mg/dL — AB (ref 70–99)
Glucose-Capillary: 149 mg/dL — ABNORMAL HIGH (ref 70–99)
Glucose-Capillary: 121 mg/dL — ABNORMAL HIGH (ref 70–99)
Glucose-Capillary: 267 mg/dL — ABNORMAL HIGH (ref 70–99)
Glucose-Capillary: 202 mg/dL — ABNORMAL HIGH (ref 70–99)

## 2014-11-19 MED ORDER — INSULIN ASPART 100 UNIT/ML ~~LOC~~ SOLN
0.0000 [IU] | Freq: Three times a day (TID) | SUBCUTANEOUS | Status: DC
  Administered 2014-11-19 – 2014-11-20 (×2): 7 [IU] via SUBCUTANEOUS
  Administered 2014-11-20: 20 [IU] via SUBCUTANEOUS

## 2014-11-19 NOTE — Progress Notes (Signed)
Rehab Admissions Coordinator Note:  Patient was screened by Clancey Welton L for appropriateness for an Inpatient Acute Rehab Consult.  At this time, we are recommending Inpatient Rehab consult.  Jannifer Fischler L 11/19/2014, 8:58 AM  I can be reached at [phone].

## 2014-11-19 NOTE — Progress Notes (Signed)
Rehab admissions - I met with pt in follow up to rehab MD consult to explain the possibility of inpatient rehab. Further questions were answered and pt was interested in pursuing inpatient rehab. I then called and spoke with pt's dtr by phone and also communicated with pt's wife (wife is deaf and read lips). They are both in support of pursuing inpatient rehab. Further questions were answered as well.  I then called and left message with Dr. Kathyrn Sheriff to see when pt would be medically cleared for inpatient rehab.   We will consider possible inpatient rehab admit pending his medical clearance and our bed availability.  I will keep the pt/family and medical team aware of any updates as I wait to hear back from Dr. Kathyrn Sheriff.  Please call me with any questions. Thanks.  Nanetta Batty, PT Rehabilitation Admissions Coordinator [phone]

## 2014-11-19 NOTE — PMR Pre-admission (Signed)
PMR Admission Coordinator Pre-Admission Assessment  Patient: [name]. 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 Date: A & B: 09-20-05      Deduct: $1260      Out of Pocket Max: none      Life Max: unlimited CIR: 100%      SNF: 100% days 1-20; 80% days 21-100 (100 day visit max) Outpatient: 80%    Co-Pay: 20% Home Health: 100%      Co-Pay: none, no visit limits DME: 80%     Co-Pay: 20% Providers: pt's preference  SECONDARY: Medicaid of [place]      Policy#: [id number]      Subscriber: self Benefits:  Phone #: [phone]      Eff. Date: eligible as of 11-19-14       Emergency Contact Information Contact Information    Name Relation Home Work Bovey Spouse [phone]     Diangelo, Radel [phone]    Penny Pia Daughter [phone]       Current Medical History  Patient Admitting Diagnosis: cervical spondylosis with myelopathy s/p decompression  History of Present Illness: Luke MOFIELD Sr. is a 68 y.o. male with history of HTN, DM type 2, NASH, COPD, who has had problems gait disorder with numbness BLE and BUE with difficulty walking since June. Work up with significant cervical spondylitic changes with large osteophytes C3-C7 with cord compression but sugery few months ago was cancelled due to anemia with significant thrombocytopenia.  He was evaluated by hematology and was recently cleared for surgery with recommendations to follow platelets.  He was admitted on 11/13/14 for ACDF C3-C7 by Dr. Kathyrn Sheriff. Extubated on 11/25 and PCCM following for input. NPO recommended initially by ST due to overt signs of aspiration and suspicion of post op edema. Therapy ongoing in addition to dysphagia treatment and  he was started on dysphagia 1 thin liquids. Neck and shoulder pain improving and respiratory status stable. CIR recommended by rehab team and MD for follow up therapy.    Past Medical History  Past Medical History  Diagnosis Date  . Stroke   . Hypertension   . COPD (chronic obstructive pulmonary disease)   . Diabetes mellitus without complication   . H/O hiatal hernia   . Depression   . Arthritis   . Hepatitis     NASH  . Cirrhosis of liver not due to alcohol     Family History  family history is not on file.  Prior Rehab/Hospitalizations: pt had recent previous home health nursing and home health PT.   Current Medications  Current facility-administered medications: albuterol (PROVENTIL) (2.5 MG/3ML) 0.083% nebulizer solution 2.5 mg, 2.5 mg, Nebulization, Q2H PRN, Corey Harold, NP;  allopurinol (ZYLOPRIM) tablet 100 mg, 100 mg, Oral, BID, Consuella Lose, MD, 100 mg at 11/20/14 0959;  antiseptic oral rinse (CPC / CETYLPYRIDINIUM CHLORIDE 0.05%) solution 7 mL, 7 mL, Mouth Rinse, QID, Corey Harold, NP, 7 mL at 11/20/14 1153 brimonidine (ALPHAGAN) 0.2 % ophthalmic solution 1 drop, 1 drop, Left Eye, BID, Consuella Lose, MD, 1 drop at 11/20/14 1000;  cholecalciferol (VITAMIN D) tablet 1,000 Units,  1,000 Units, Oral, Daily, Consuella Lose, MD, 1,000 Units at 11/20/14 0958;  cholestyramine (QUESTRAN) packet 4 g, 4 g, Oral, TID, Consuella Lose, MD, 4 g at 11/20/14 0957;  diazepam (VALIUM) tablet 5 mg, 5 mg, Oral, Q6H PRN, Consuella Lose, MD docusate sodium (COLACE) capsule 100 mg, 100 mg, Oral, BID, Consuella Lose, MD, 100 mg at 11/20/14 6789;  ferrous sulfate tablet 325 mg, 325 mg, Oral, TID WC, Consuella Lose, MD, 325 mg at 11/20/14 1153;  insulin aspart (novoLOG) injection 0-20 Units, 0-20 Units, Subcutaneous, TID WC, Consuella Lose, MD, 20 Units at 11/20/14 1152 lactulose (CHRONULAC) 10 GM/15ML solution 20 g, 20 g, Per Tube, BID, Raylene Miyamoto, MD, 20 g at  11/20/14 1010;  lactulose (CHRONULAC) 10 GM/15ML solution 30 g, 30 g, Oral, BID PRN, Consuella Lose, MD;  menthol-cetylpyridinium (CEPACOL) lozenge 3 mg, 1 lozenge, Oral, PRN **OR** phenol (CHLORASEPTIC) mouth spray 1 spray, 1 spray, Mouth/Throat, PRN, Consuella Lose, MD methocarbamol (ROBAXIN) 500 mg in dextrose 5 % 50 mL IVPB, 500 mg, Intravenous, Q6H PRN, Eustace Moore, MD;  ondansetron [facility]) injection 4 mg, 4 mg, Intravenous, Q4H PRN, Consuella Lose, MD, 4 mg at 11/17/14 0259;  oxyCODONE (Oxy IR/ROXICODONE) immediate release tablet 10 mg, 10 mg, Oral, Q4H PRN, Eustace Moore, MD, 10 mg at 11/20/14 0401 pantoprazole (PROTONIX) EC tablet 40 mg, 40 mg, Oral, BID, Raylene Miyamoto, MD, 40 mg at 11/20/14 3810;  rifaximin Doreene Nest) tablet 550 mg, 550 mg, Oral, BID, Consuella Lose, MD, 550 mg at 11/20/14 1751;  senna (SENOKOT) tablet 8.6 mg, 1 tablet, Oral, BID, Consuella Lose, MD, 8.6 mg at 11/20/14 0258;  sodium chloride 0.9 % injection 3 mL, 3 mL, Intravenous, Q12H, Consuella Lose, MD, 3 mL at 11/18/14 2220 sodium chloride 0.9 % injection 3 mL, 3 mL, Intravenous, PRN, Consuella Lose, MD;  timolol (TIMOPTIC) 0.5 % ophthalmic solution 1 drop, 1 drop, Left Eye, BID, Consuella Lose, MD, 1 drop at 11/20/14 1000  Patients Current Diet: DIET - DYS 1, thin liquids, meds crushed in puree, full supervision  Precautions / Restrictions Precautions Precautions: Fall, Cervical Precaution Comments: 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 Pt states he walks to nearby stores that are 15 minutes from home. Pt was using a walker and progressed to a cane. He and his wife get out to church.   Home Assistive Devices / Equipment Home Assistive Devices/Equipment: Cane  (specify quad or straight), Walker (specify type), Shower chair with back, Raised toilet seat with rails, Dentures (specify type) (therapist, nurse) Home Equipment: Walker - 2 wheels, Cane - single point  Prior Functional Level Prior Function Level of Independence: Independent with assistive device(s)  Current Functional Level Cognition  Overall Cognitive Status: Impaired/Different from baseline Orientation Level: Oriented to person, Oriented to place, Disoriented to time, Disoriented to situation Safety/Judgement: Decreased awareness of safety    Extremity Assessment (includes Sensation/Coordination)          ADLs       Mobility  Overal bed mobility: Needs Assistance Bed Mobility: Rolling, Sidelying to Sit Rolling: Mod assist Sidelying to sit: Mod assist Supine to sit: Min assist Sit to supine: Min assist General bed mobility comments: Min A to steady patient up into sitting and to ensure balance. Cues for positioning  Transfers  Overall transfer level: Needs assistance Equipment used: Rolling walker (2 wheeled) Transfers: Sit to/from Stand Sit to Stand: Min assist General transfer comment: Min A to ensure balance with stand. Patient with initial posterior lean but with cues able to correct. Patient needed cues for safe hand placement and not to pull up from RW    Ambulation / Gait / Stairs / Wheelchair Mobility  Ambulation/Gait Ambulation/Gait assistance: Museum/gallery curator (Feet): 150 Feet Assistive device: Rolling walker (2 wheeled) Gait Pattern/deviations: Step-through pattern, Decreased stride length Gait velocity: decreased Gait velocity interpretation: Below normal speed for age/gender General Gait Details: cues for upright posture and for RW safety. patient needed assistance to [employer] and avoid obstacles    Posture / Balance Dynamic Sitting Balance Sitting balance - Comments: anterior bias    Special needs/care consideration BiPAP/CPAP  no CPM no  Continuous Drip IV no  Dialysis no         Life Vest no  Oxygen no  Special Bed no Trach Size no  Wound Vac (area) no        Skin - bruising noted on anterior neck and some varied bruises on forearms                       Bowel mgmt: last BM on 11-19-14 Bladder mgmt: using urinal Diabetic mgmt - yes, uses insulin at home  Note: pt is blind in R eye and has 28% vision in L eye   Previous Home Environment Living Arrangements: Spouse/significant other Available Help at Discharge: Family, Available 24 hours/day Type of Home: Apartment Home Layout: One level Home Access: Level entry Home Care Services: Yes Type of Home Care Services: Home RN (PT) Home Care Agency (if known):  (advanced home care) Additional Comments: Need to confirm home set-up as pt with some cognitive deficits.    Discharge Living Setting Plans for Discharge Living Setting: Patient's home, Apartment Type of Home at Discharge: Apartment Discharge Home Layout: One level Discharge Home Access: Level entry Does the patient have any problems obtaining your medications?: Yes (Describe) (possible transportation issues as dtr shared their car recently broke down)  Social/Family/Support Systems Patient Roles: Spouse, Parent Contact Information: dtr Penny Pia is primary contact as wife is deaf. Wife is able to read lips and can communicate well in person. Anticipated Caregiver: wife and dtr (note that goals are for Mod Ind) Anticipated Caregiver's Contact Information: see above, noting that pt's wife is deaf and reads lips. Ability/Limitations of Caregiver: wife/dtr avail. to help. Wife is deaf. Caregiver Availability: 24/7 Discharge Plan Discussed with Primary Caregiver: Yes Is Caregiver In Agreement with Plan?: Yes Does Caregiver/Family have Issues with Lodging/Transportation while Pt is in Rehab?: No  Goals/Additional Needs Patient/Family Goal for Rehab: Mod ind with PT, OT and SLP Expected length  of stay: 7 days Cultural Considerations: pt and his wife attend [employer] Dietary Needs: Dys 1, thin liquids, meds crushed in puree, full supervision Equipment Needs: to be determined Pt/Family Agrees to Admission and willing to participate: Yes (spoke with pt's dtr/wife by phone on 11-30. I met with pt's wife on 11-20-14 as well.) Program Orientation Provided & Reviewed with Pt/Caregiver Including Roles  & Responsibilities: Yes   Decrease burden of Care through IP rehab admission: NA  Possible need for SNF placement upon discharge: not anticipated  Patient Condition: This patient's condition remains as documented in the consult dated 11-19-14, in which the Rehabilitation Physician determined and documented  that the patient's condition is appropriate for intensive rehabilitative care in an inpatient rehabilitation facility. Will admit to inpatient rehab today.  Preadmission Screen Completed By:  Nanetta Batty, PT, 11/20/2014 2:47 PM ______________________________________________________________________   Discussed status with Dr. Nicole Kindred on 11-20-14 at 1447 and received telephone approval for admission today.  Admission Coordinator: Nanetta Batty, PT, time 1447/Date 11-20-14

## 2014-11-19 NOTE — Progress Notes (Signed)
Physical Therapy Treatment Patient Details Name: Luke VISCOMI Sr. MRN: [id number] DOB: Aug 12, 1946 Today's Date: 11/19/2014    History of Present Illness pt presents with C3-7 ACDF intubated 11/24- 11/25.      PT Comments    Patient is progressing with ambulation this session. Continues to have issues with balance due to poor sight and unfamiliar environment. Reviewed cervical precautions with patient.  Continue to recommend comprehensive inpatient rehab (CIR) for post-acute therapy needs.   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 Required Braces or Orthoses: Cervical Brace Cervical Brace: Hard collar;At all times Restrictions Weight Bearing Restrictions: No    Mobility  Bed Mobility               General bed mobility comments: Patient up in recliner before and after session  Transfers Overall transfer level: Needs assistance Equipment used: Rolling walker (2 wheeled) Transfers: Sit to/from Stand Sit to Stand: Min assist         General transfer comment: cues for hand placement with assist to stand and mod assist to control descent to surface  Ambulation/Gait Ambulation/Gait assistance: Min assist Ambulation Distance (Feet): 120 Feet Assistive device: Rolling walker (2 wheeled) Gait Pattern/deviations: Step-through pattern;Decreased stride length Gait velocity: decreased Gait velocity interpretation: Below normal speed for age/gender General Gait Details: Patient able to grasp RW this session but required A to manuever around objects and for balance. Cues for upright posture   Stairs            Wheelchair Mobility    Modified Rankin (Stroke Patients Only)       Balance                                     Cognition Arousal/Alertness: Awake/alert Behavior During Therapy: WFL for tasks assessed/performed Overall Cognitive Status: Impaired/Different from baseline   Orientation Level: Time   Memory: Decreased recall of precautions   Safety/Judgement: Decreased awareness of safety          Exercises      General Comments        Pertinent Vitals/Pain Pain Assessment: No/denies pain    Home Living                      Prior Function            PT Goals (current goals can now be found in the care plan section) Progress towards PT goals: Progressing toward goals    Frequency  Min 4X/week    PT Plan Current plan remains appropriate    Co-evaluation             End of Session Equipment Utilized During Treatment: Gait belt Activity Tolerance: Patient tolerated treatment well Patient left: in chair;with call bell/phone within reach;with chair alarm set     Time: 9767-3419 PT Time Calculation (min) (ACUTE ONLY): 24 min  Charges:  $Gait Training: 8-22 mins $Therapeutic Activity: 8-22 mins                    G Codes:      Jacqualyn Posey 11/19/2014, 10:48 AM  11/19/2014 Giani Betzold, Tonia Brooms PTA  [phone] pager [phone] office

## 2014-11-19 NOTE — Progress Notes (Signed)
CARE MANAGEMENT NOTE 11/19/2014  Patient:  ALECSANDER, HATTABAUGH   Account Number:  [id number]  Date Initiated:  11/14/2014  Documentation initiated by:  Sandi Mariscal  Subjective/Objective Assessment:   Back surgery (see below for details)     Action/Plan:   await PT/OT evals to determine pt's needs for next level of care   Anticipated DC Date:  11/18/2014   Anticipated DC Plan:  [place]  CM consult      Choice offered to / List presented to:             Status of service:  In process, will continue to follow Medicare Important Message given?  YES (If response is "NO", the following Medicare IM given date fields will be blank) Date Medicare IM given:  11/19/2014 Medicare IM given by:  [facility] Date Additional Medicare IM given:   Additional Medicare IM given by:    Discharge Disposition:    Per UR Regulation:  Reviewed for med. necessity/level of care/duration of stay  If discussed at [place] of Stay Meetings, dates discussed:    Comments:  11/19/2014 1000 NCM spoke to pt and states he lives at home with wife, Vaughan Basta. Had [facility] in the past for [facility]. He has cane and RW at home. Waiting insurance approval for CIR. Jonnie Finner RN CCM Case Mgmt phone [phone]

## 2014-11-19 NOTE — Progress Notes (Signed)
No issues overnight. Pt reports significant improvement in his arm pain. Swallowing better today. Overall doing well.  EXAM:  BP 149/63 mmHg  Pulse 85  Temp(Src) 97.9 F (36.6 C) (Oral)  Resp 20  Ht 5\' 5"  (1.651 m)  Wt 91.5 kg (201 lb 11.5 oz)  BMI 33.57 kg/m2  SpO2 98%  Awake, alert Speech fluent, appropriate  CN grossly intact  5/5 BUE/BLE   IMPRESSION:  68 y.o. male POD# 5 s/p 4 level ACDF for myelopathy, progressing albeit slowly.  PLAN: - If stable, will plan on transfer to CIR tomorrow

## 2014-11-19 NOTE — Progress Notes (Signed)
Speech Language Pathology Treatment: Dysphagia  Patient Details Name: Luke FOLZ Sr. MRN: [id number] DOB: 1946-01-21 Today's Date: 11/19/2014 Time: [id number] SLP Time Calculation (min) (ACUTE ONLY): 16 min  Assessment / Plan / Recommendation Clinical Impression  Pt is making steady progress with swallowing recovery, with reduced number of swallows per bite/sip. Pt has a consistent second swallow with thin liquids, with no additional swallows noted with puree. SLP provided Min verbal cues for small sips to eliminate immediate cough s/p straw sips. SLP offered trials of Dys 2 textures, although patient allowed graham crackers to dissolve in applesauce prior to eating them. He reports difficulty chewing them without his dentures in place. Prognosis for diet advancement is good with use of dentures; encouraged patient to have family bring them in.   HPI HPI: 69 year old male presented for surgery 11/24 after prolonged weakness and gait disturbance. Underwent prolonged neurosurgical procedure (ACDF C3-4, 4-5, 5-6, 6-7) and remained intubated post-operatively (11/24-11/25). Pt reports a h/o dysphagia several years ago after TIA, for which he worked with a Astronomer. He does not recall if he required an altered diet.   Pertinent Vitals Pain Assessment: No/denies pain  SLP Plan  Continue with current plan of care    Recommendations Diet recommendations: Dysphagia 1 (puree);Thin liquid Liquids provided via: Cup;Straw Medication Administration: Crushed with puree Supervision: Patient able to self feed;Full supervision/cueing for compensatory strategies Compensations: Slow rate;Small sips/bites;Follow solids with liquid Postural Changes and/or Swallow Maneuvers: Seated upright 90 degrees;Upright 30-60 min after meal              Oral Care Recommendations: Oral care BID Follow up Recommendations: Inpatient Rehab Plan: Continue with current plan of care    GO      Germain Osgood, M.A. CCC-SLP [phone]  Germain Osgood 11/19/2014, 4:25 PM

## 2014-11-19 NOTE — Consult Note (Signed)
Physical Medicine and Rehabilitation Consult   Reason for Consult: Cervical myelopathy.  Referring Physician: Dr. Kathyrn Sheriff.    HPI: Luke MATHERNE Sr. is a 68 y.o. male with history of HTN, DM type 2, NASH, COPD, who has had problems gait disorder with numbness BLE and BUE with difficulty walking since June. Work up with significant cervical spondylitic changes with large osteophytes C3-C7 with cord compression but sugery few months ago was cancelled due to anemia with significant thrombocytopenia.  He was evaluated by hematology and was recently cleared for surgery with recommendations to follow platelets.  He was admitted on 11/13/14 for ACDF C3-C7 by Dr. Kathyrn Sheriff. Extubated on 11/25 and PCCM following for input. NPO recommended initially by ST due to overt signs of aspiration and suspicion of post op edema. Therapy ongoing in addition to dysphagia treatment and he was started on dysphagia 1 thin liquids. Neck and shoulder pain improving and respiratory status stable. CIR recommended by rehab team and MD for follow up therapy.   Patient reports that he's feeling stronger since surgery and would prefer to go home with HHPT. Has a supportive family who can assist as needed.    Review of Systems  Eyes: Positive for blurred vision.       Blind in right eye  Respiratory: Negative for cough and shortness of breath.   Cardiovascular: Negative for chest pain and palpitations.  Gastrointestinal: Positive for constipation (chronic without lactulose). Negative for heartburn and nausea.  Genitourinary: Negative for dysuria and urgency.  Musculoskeletal: Negative for back pain and joint pain.  Neurological: Positive for tingling (bilateral hands and RLE), focal weakness and weakness. Negative for dizziness.      Past Medical History  Diagnosis Date  . Stroke   . Hypertension   . COPD (chronic obstructive pulmonary disease)   . Diabetes mellitus without complication   . H/O hiatal  hernia   . Depression   . Arthritis   . Hepatitis     NASH  . Cirrhosis of liver not due to alcohol     Past Surgical History  Procedure Laterality Date  . Nose surgery    . Appendectomy    . Cardiac catheterization    . Eye surgery      leaglly blind  . Anterior cervical decompression/discectomy fusion 4 levels N/A 11/13/2014    Procedure:  Cervical three-four, Cervical four-five, Cervical five-six, Cervical six- seven anterior cervical decompression with fusion and interbody prosthesis plating and bonegraft;  Surgeon: Consuella Lose, MD;  Location: [place] [facility];  Service: Neurosurgery;  Laterality: N/A;    History reviewed. No pertinent family history.    Social History:  Married. 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 Per reports that he has never smoked. He does not have any smokeless tobacco history on file. He reports that he does not drink alcohol or use illicit drugs.    Allergies  Allergen Reactions  . Actos [Pioglitazone] Anaphylaxis  . Amitriptyline     Unknown   . Hctz [Hydrochlorothiazide]     Unknown   . Naproxen     Unknown   . Penicillins Rash    rash    Medications Prior to Admission  Medication Sig Dispense Refill  . allopurinol (ZYLOPRIM) 100 MG tablet Take 100 mg by mouth 2 (two) times daily.    Marland Kitchen amLODipine (NORVASC) 10 MG tablet Take 10 mg by mouth daily.    . brimonidine-timolol (COMBIGAN) 0.2-0.5 %  ophthalmic solution Place 1 drop into the left eye every 12 (twelve) hours.    . Cholecalciferol (VITAMIN D-3) 1000 UNITS CAPS Take 1,000 Units by mouth daily.     . cholestyramine (QUESTRAN) 4 G packet Take 4 g by mouth 3 (three) times daily.    . colchicine 0.6 MG tablet Take 0.6 mg by mouth daily as needed (gout).    . ferrous sulfate 325 (65 FE) MG tablet Take 325 mg by mouth 3 (three) times daily with meals.    . furosemide (LASIX) 40 MG tablet Take 40 mg by mouth daily.    . insulin aspart (NOVOLOG) 100 UNIT/ML  FlexPen Inject into the skin 3 (three) times daily with meals. Sliding scale    . insulin glargine (LANTUS) 100 UNIT/ML injection Inject 80 Units into the skin at bedtime.    Marland Kitchen lactulose (CHRONULAC) 10 GM/15ML solution Take 30 g by mouth 2 (two) times daily as needed for mild constipation.    . nadolol (CORGARD) 20 MG tablet Take 20 mg by mouth daily.    . pantoprazole (PROTONIX) 40 MG tablet Take 40 mg by mouth 2 (two) times daily.    . rifaximin (XIFAXAN) 550 MG TABS tablet Take 550 mg by mouth 2 (two) times daily.    . sertraline (ZOLOFT) 50 MG tablet Take 50 mg by mouth daily.    . traMADol (ULTRAM) 50 MG tablet Take 50 mg by mouth every 6 (six) hours as needed for moderate pain.    Marland Kitchen omeprazole (PRILOSEC) 20 MG capsule Take 20 mg by mouth 2 (two) times daily before a meal.      Home: Home Living Family/patient expects to be discharged to:: Private residence Living Arrangements: Spouse/significant other Available Help at Discharge: Family, Available 24 hours/day Type of Home: Apartment Home Access: Level entry Home Layout: One level Home Equipment: Walker - 2 wheels, Cane - single point Additional Comments: Need to confirm home set-up as pt with some cognitive deficits.    Functional History: Prior Function Level of Independence: Independent with assistive device(s) Functional Status:  Mobility: Bed Mobility Overal bed mobility: Needs Assistance Bed Mobility: Rolling, Sidelying to Sit Rolling: Mod assist Sidelying to sit: Mod assist Supine to sit: Mod assist Sit to supine: Min assist General bed mobility comments: cues for sequence with assist to rotate body and elevate trunk from surface with multimodal cues Transfers Overall transfer level: Needs assistance Equipment used: Rolling walker (2 wheeled) Transfers: Sit to/from Stand Sit to Stand: Mod assist, Min assist General transfer comment: cues for hand placement with assist to stand and mod assist to control descent to  surface Ambulation/Gait Ambulation/Gait assistance: Mod assist, +2 safety/equipment Ambulation Distance (Feet): 150 Feet Assistive device: Rolling walker (2 wheeled) Gait Pattern/deviations: Step-through pattern, Decreased stride length, Drifts right/left Gait velocity interpretation: Below normal speed for age/gender General Gait Details: pt with difficulty maintaining grasp on RW with bil UE, drifting bil with running into objects multiple times throughout with gait speed too quick for pt safety with continued cues for steering RW as pt not attempting to scan his environment with limited vision    ADL:    Cognition: Cognition Overall Cognitive Status: Impaired/Different from baseline Orientation Level: Oriented to person, Oriented to place, Oriented to situation, Disoriented to time Cognition Arousal/Alertness: Awake/alert Behavior During Therapy: WFL for tasks assessed/performed Overall Cognitive Status: Impaired/Different from baseline Area of Impairment: Orientation, Safety/judgement Orientation Level: Time Memory: Decreased recall of precautions Safety/Judgement: Decreased awareness of safety  Blood pressure  152/63, pulse 88, temperature 98 F (36.7 C), temperature source Oral, resp. rate 18, height 5\' 5"  (1.651 m), weight 91.5 kg (201 lb 11.5 oz), SpO2 99 %. Physical Exam  Nursing note and vitals reviewed. Constitutional: He is oriented to person, place, and time. He appears well-developed and well-nourished.  HENT:  Head: Normocephalic and atraumatic.  Bruising and swelling at operative site.  Eyes: Conjunctivae are normal. Pupils are equal, round, and reactive to light.  Neck:  Immobilized in collar  Cardiovascular: Normal rate and regular rhythm.   No murmur heard. Respiratory: Effort normal and breath sounds normal.  GI: Soft. Bowel sounds are normal.  Musculoskeletal: He exhibits no edema or tenderness.  Neurological: He is alert and oriented to person, place,  and time.  Mild dysarthria as edentulous. Able to follow commands without difficulty. Sensory deficits RLE and bilateral hands. UES grossly 5/5. LE's 4/5 prox to 5/5 distally. He is a bit impulsive and safety judgement impaired. Impaired balance.  Skin: Skin is warm and dry.  Psychiatric: He has a normal mood and affect. His behavior is normal.    Results for orders placed or performed during the hospital encounter of 11/13/14 (from the past 24 hour(s))  Glucose, capillary     Status: Abnormal   Collection Time: 11/18/14 11:22 AM  Result Value Ref Range   Glucose-Capillary 167 (H) 70 - 99 mg/dL  Glucose, capillary     Status: Abnormal   Collection Time: 11/18/14  4:07 PM  Result Value Ref Range   Glucose-Capillary 268 (H) 70 - 99 mg/dL   Comment 1 Documented in Chart   Glucose, capillary     Status: Abnormal   Collection Time: 11/18/14  8:40 PM  Result Value Ref Range   Glucose-Capillary 123 (H) 70 - 99 mg/dL   Comment 1 Notify RN    Comment 2 Documented in Chart   Glucose, capillary     Status: Abnormal   Collection Time: 11/19/14 12:23 AM  Result Value Ref Range   Glucose-Capillary 156 (H) 70 - 99 mg/dL   Comment 1 Notify RN    Comment 2 Documented in Chart   Glucose, capillary     Status: Abnormal   Collection Time: 11/19/14  4:58 AM  Result Value Ref Range   Glucose-Capillary 149 (H) 70 - 99 mg/dL  Glucose, capillary     Status: Abnormal   Collection Time: 11/19/14  7:34 AM  Result Value Ref Range   Glucose-Capillary 121 (H) 70 - 99 mg/dL   No results found.  Assessment/Plan: Diagnosis: cervical spondylosis with myelopathy s/p decompression 1. Does the need for close, 24 hr/day medical supervision in concert with the patient's rehab needs make it unreasonable for this patient to be served in a less intensive setting? Yes 2. Co-Morbidities requiring supervision/potential complications: pain, safety, dysphagia 3. Due to bladder management, bowel management, safety,  skin/wound care, disease management, medication administration, pain management and patient education, does the patient require 24 hr/day rehab nursing? Yes 4. Does the patient require coordinated care of a physician, rehab nurse, PT (1-2 hrs/day, 5 days/week), OT (1-2 hrs/day, 5 days/week) and SLP (1-2 hrs/day, 5 days/week) to address physical and functional deficits in the context of the above medical diagnosis(es)? Yes Addressing deficits in the following areas: balance, endurance, locomotion, strength, transferring, bowel/bladder control, bathing, dressing, feeding, grooming, toileting, cognition, speech, swallowing and psychosocial support 5. Can the patient actively participate in an intensive therapy program of at least 3 hrs of therapy per day  at least 5 days per week? Yes 6. The potential for patient to make measurable gains while on inpatient rehab is excellent 7. Anticipated functional outcomes upon discharge from inpatient rehab are modified independent  with PT, modified independent with OT, modified independent with SLP. 8. Estimated rehab length of stay to reach the above functional goals is: 7 days 9. Does the patient have adequate social supports and living environment to accommodate these discharge functional goals? Yes 10. Anticipated D/C setting: Home 11. Anticipated post D/C treatments: [place] therapy 12. Overall Rehab/Functional Prognosis: excellent  RECOMMENDATIONS: This patient's condition is appropriate for continued rehabilitative care in the following setting: CIR Patient has agreed to participate in recommended program. Yes Note that insurance prior authorization may be required for reimbursement for recommended care.  Comment: Rehab Admissions Coordinator to follow up.  Thanks,  Meredith Staggers, MD, Mellody Drown     11/19/2014

## 2014-11-20 ENCOUNTER — Inpatient Hospital Stay (HOSPITAL_COMMUNITY)
Admission: RE | Admit: 2014-11-20 | Discharge: 2014-11-30 | DRG: 552 | Disposition: A | Discharge status: Home Health | Payer: Medicare Other | Source: Intra-hospital | Attending: Physical Medicine & Rehabilitation | Admitting: Physical Medicine & Rehabilitation

## 2014-11-20 ENCOUNTER — Encounter (HOSPITAL_COMMUNITY): Payer: Self-pay | Admitting: Physical Medicine & Rehabilitation

## 2014-11-20 ENCOUNTER — Ambulatory Visit: Payer: Self-pay | Admitting: Internal Medicine

## 2014-11-20 DIAGNOSIS — M4802 Spinal stenosis, cervical region: Secondary | ICD-10-CM | POA: Insufficient documentation

## 2014-11-20 DIAGNOSIS — E11649 Type 2 diabetes mellitus with hypoglycemia without coma: Secondary | ICD-10-CM | POA: Diagnosis not present

## 2014-11-20 DIAGNOSIS — D696 Thrombocytopenia, unspecified: Secondary | ICD-10-CM | POA: Diagnosis present

## 2014-11-20 DIAGNOSIS — B961 Klebsiella pneumoniae [K. pneumoniae] as the cause of diseases classified elsewhere: Secondary | ICD-10-CM | POA: Diagnosis not present

## 2014-11-20 DIAGNOSIS — IMO0002 Reserved for concepts with insufficient information to code with codable children: Secondary | ICD-10-CM | POA: Diagnosis present

## 2014-11-20 DIAGNOSIS — J449 Chronic obstructive pulmonary disease, unspecified: Secondary | ICD-10-CM | POA: Diagnosis present

## 2014-11-20 DIAGNOSIS — G4733 Obstructive sleep apnea (adult) (pediatric): Secondary | ICD-10-CM

## 2014-11-20 DIAGNOSIS — D6489 Other specified anemias: Secondary | ICD-10-CM | POA: Diagnosis present

## 2014-11-20 DIAGNOSIS — Z8709 Personal history of other diseases of the respiratory system: Secondary | ICD-10-CM

## 2014-11-20 DIAGNOSIS — R197 Diarrhea, unspecified: Secondary | ICD-10-CM | POA: Diagnosis present

## 2014-11-20 DIAGNOSIS — M4712 Other spondylosis with myelopathy, cervical region: Principal | ICD-10-CM

## 2014-11-20 DIAGNOSIS — K7581 Nonalcoholic steatohepatitis (NASH): Secondary | ICD-10-CM | POA: Diagnosis present

## 2014-11-20 DIAGNOSIS — I10 Essential (primary) hypertension: Secondary | ICD-10-CM | POA: Diagnosis present

## 2014-11-20 DIAGNOSIS — E1165 Type 2 diabetes mellitus with hyperglycemia: Secondary | ICD-10-CM | POA: Diagnosis present

## 2014-11-20 DIAGNOSIS — R1310 Dysphagia, unspecified: Secondary | ICD-10-CM | POA: Diagnosis present

## 2014-11-20 DIAGNOSIS — E11319 Type 2 diabetes mellitus with unspecified diabetic retinopathy without macular edema: Secondary | ICD-10-CM | POA: Diagnosis present

## 2014-11-20 DIAGNOSIS — H409 Unspecified glaucoma: Secondary | ICD-10-CM | POA: Diagnosis present

## 2014-11-20 DIAGNOSIS — N390 Urinary tract infection, site not specified: Secondary | ICD-10-CM | POA: Diagnosis not present

## 2014-11-20 DIAGNOSIS — D649 Anemia, unspecified: Secondary | ICD-10-CM | POA: Diagnosis present

## 2014-11-20 DIAGNOSIS — R4189 Other symptoms and signs involving cognitive functions and awareness: Secondary | ICD-10-CM | POA: Diagnosis present

## 2014-11-20 HISTORY — DX: Obstructive sleep apnea (adult) (pediatric): G47.33

## 2014-11-20 LAB — GLUCOSE, CAPILLARY
GLUCOSE-CAPILLARY: 233 mg/dL — AB (ref 70–99)
Glucose-Capillary: 242 mg/dL — ABNORMAL HIGH (ref 70–99)
Glucose-Capillary: 357 mg/dL — ABNORMAL HIGH (ref 70–99)
Glucose-Capillary: 189 mg/dL — ABNORMAL HIGH (ref 70–99)
Glucose-Capillary: 345 mg/dL — ABNORMAL HIGH (ref 70–99)

## 2014-11-20 MED ORDER — ONDANSETRON HCL 4 MG/2ML IJ SOLN: 4.0000 mg | Freq: Four times a day (QID) | INTRAMUSCULAR | Status: DC | PRN

## 2014-11-20 MED ORDER — INSULIN GLARGINE 100 UNIT/ML ~~LOC~~ SOLN
40.0000 [IU] | Freq: Every day | SUBCUTANEOUS | Status: DC
  Administered 2014-11-20 – 2014-11-21 (×2): 40 [IU] via SUBCUTANEOUS
  Filled 2014-11-20 (×3): qty 0.4

## 2014-11-20 MED ORDER — TIMOLOL MALEATE 0.5 % OP SOLN
1.0000 [drp] | Freq: Two times a day (BID) | OPHTHALMIC | Status: DC
  Administered 2014-11-20 – 2014-11-30 (×20): 1 [drp] via OPHTHALMIC
  Filled 2014-11-20: qty 5

## 2014-11-20 MED ORDER — ALUM & MAG HYDROXIDE-SIMETH 200-200-20 MG/5ML PO SUSP: 30.0000 mL | ORAL | Status: DC | PRN

## 2014-11-20 MED ORDER — DIPHENHYDRAMINE HCL 12.5 MG/5ML PO ELIX: 12.5000 mg | ORAL_SOLUTION | Freq: Four times a day (QID) | ORAL | Status: DC | PRN

## 2014-11-20 MED ORDER — DEXTROSE 5 % IV SOLN: 500.0000 mg | Freq: Four times a day (QID) | INTRAVENOUS | Status: DC | PRN

## 2014-11-20 MED ORDER — PANTOPRAZOLE SODIUM 40 MG PO TBEC
40.0000 mg | DELAYED_RELEASE_TABLET | Freq: Two times a day (BID) | ORAL | Status: DC
  Administered 2014-11-20 – 2014-11-30 (×20): 40 mg via ORAL
  Filled 2014-11-20 (×22): qty 1

## 2014-11-20 MED ORDER — CHOLESTYRAMINE 4 G PO PACK
4.0000 g | PACK | Freq: Three times a day (TID) | ORAL | Status: DC
  Administered 2014-11-20 – 2014-11-30 (×29): 4 g via ORAL
  Filled 2014-11-20 (×32): qty 1

## 2014-11-20 MED ORDER — PHENOL 1.4 % MT LIQD: 1.0000 | OROMUCOSAL | Status: DC | PRN

## 2014-11-20 MED ORDER — LACTULOSE 10 GM/15ML PO SOLN
30.0000 g | Freq: Two times a day (BID) | ORAL | Status: DC
  Administered 2014-11-20 – 2014-11-30 (×20): 30 g
  Filled 2014-11-20 (×22): qty 45

## 2014-11-20 MED ORDER — GUAIFENESIN-DM 100-10 MG/5ML PO SYRP: 5.0000 mL | ORAL_SOLUTION | Freq: Four times a day (QID) | ORAL | Status: DC | PRN

## 2014-11-20 MED ORDER — BRIMONIDINE TARTRATE 0.2 % OP SOLN
1.0000 [drp] | Freq: Two times a day (BID) | OPHTHALMIC | Status: DC
  Administered 2014-11-20 – 2014-11-30 (×20): 1 [drp] via OPHTHALMIC
  Filled 2014-11-20: qty 5

## 2014-11-20 MED ORDER — OXYCODONE HCL 10 MG PO TABS: 10.0000 mg | ORAL_TABLET | ORAL | Status: DC | PRN

## 2014-11-20 MED ORDER — DIAZEPAM 5 MG PO TABS: 5.0000 mg | ORAL_TABLET | Freq: Four times a day (QID) | ORAL | Status: DC | PRN

## 2014-11-20 MED ORDER — ONDANSETRON HCL 4 MG PO TABS: 4.0000 mg | ORAL_TABLET | Freq: Four times a day (QID) | ORAL | Status: DC | PRN

## 2014-11-20 MED ORDER — FERROUS SULFATE 325 (65 FE) MG PO TABS
325.0000 mg | ORAL_TABLET | Freq: Three times a day (TID) | ORAL | Status: DC
  Administered 2014-11-20 – 2014-11-30 (×29): 325 mg via ORAL
  Filled 2014-11-20 (×32): qty 1

## 2014-11-20 MED ORDER — ALBUTEROL SULFATE (2.5 MG/3ML) 0.083% IN NEBU: 2.5000 mg | INHALATION_SOLUTION | RESPIRATORY_TRACT | Status: DC | PRN

## 2014-11-20 MED ORDER — INSULIN ASPART 100 UNIT/ML ~~LOC~~ SOLN
0.0000 [IU] | Freq: Three times a day (TID) | SUBCUTANEOUS | Status: DC
  Administered 2014-11-20: 4 [IU] via SUBCUTANEOUS
  Administered 2014-11-21: 7 [IU] via SUBCUTANEOUS
  Administered 2014-11-21: 11 [IU] via SUBCUTANEOUS
  Administered 2014-11-21: 20 [IU] via SUBCUTANEOUS
  Administered 2014-11-22: 15 [IU] via SUBCUTANEOUS
  Administered 2014-11-22 – 2014-11-23 (×3): 7 [IU] via SUBCUTANEOUS
  Administered 2014-11-23: 11 [IU] via SUBCUTANEOUS
  Administered 2014-11-23: 4 [IU] via SUBCUTANEOUS
  Administered 2014-11-24: 11 [IU] via SUBCUTANEOUS
  Administered 2014-11-24: 4 [IU] via SUBCUTANEOUS
  Administered 2014-11-25: 7 [IU] via SUBCUTANEOUS
  Administered 2014-11-26: 3 [IU] via SUBCUTANEOUS
  Administered 2014-11-26: 11 [IU] via SUBCUTANEOUS
  Administered 2014-11-27: 15 [IU] via SUBCUTANEOUS
  Administered 2014-11-27 – 2014-11-28 (×2): 3 [IU] via SUBCUTANEOUS
  Administered 2014-11-29: 4 [IU] via SUBCUTANEOUS
  Administered 2014-11-29: 11 [IU] via SUBCUTANEOUS
  Administered 2014-11-30: 4 [IU] via SUBCUTANEOUS

## 2014-11-20 MED ORDER — VITAMIN D3 25 MCG (1000 UNIT) PO TABS
1000.0000 [IU] | ORAL_TABLET | Freq: Every day | ORAL | Status: DC
  Administered 2014-11-21 – 2014-11-30 (×10): 1000 [IU] via ORAL
  Filled 2014-11-20 (×11): qty 1

## 2014-11-20 MED ORDER — TRAMADOL HCL 50 MG PO TABS
50.0000 mg | ORAL_TABLET | Freq: Four times a day (QID) | ORAL | Status: DC | PRN
Start: 2014-11-20 — End: 2014-11-23
  Administered 2014-11-22 – 2014-11-23 (×2): 50 mg via ORAL
  Filled 2014-11-20 (×5): qty 1

## 2014-11-20 MED ORDER — RIFAXIMIN 550 MG PO TABS
550.0000 mg | ORAL_TABLET | Freq: Two times a day (BID) | ORAL | Status: DC
  Administered 2014-11-20 – 2014-11-30 (×20): 550 mg via ORAL
  Filled 2014-11-20 (×22): qty 1

## 2014-11-20 MED ORDER — CETYLPYRIDINIUM CHLORIDE 0.05 % MT LIQD
7.0000 mL | Freq: Four times a day (QID) | OROMUCOSAL | Status: DC
  Administered 2014-11-21 – 2014-11-30 (×28): 7 mL via OROMUCOSAL

## 2014-11-20 MED ORDER — METHOCARBAMOL 1000 MG/10ML IJ SOLN
500.0000 mg | Freq: Four times a day (QID) | INTRAVENOUS | Status: DC | PRN
  Filled 2014-11-20: qty 5

## 2014-11-20 MED ORDER — TRAZODONE HCL 50 MG PO TABS
25.0000 mg | ORAL_TABLET | Freq: Every evening | ORAL | Status: DC | PRN
  Administered 2014-11-20 – 2014-11-22 (×3): 50 mg via ORAL
  Filled 2014-11-20 (×3): qty 1

## 2014-11-20 MED ORDER — FLEET ENEMA 7-19 GM/118ML RE ENEM: 1.0000 | ENEMA | Freq: Once | RECTAL | Status: AC | PRN

## 2014-11-20 MED ORDER — MENTHOL 3 MG MT LOZG
1.0000 | LOZENGE | OROMUCOSAL | Status: DC | PRN
  Filled 2014-11-20: qty 9

## 2014-11-20 MED ORDER — ALLOPURINOL 100 MG PO TABS
100.0000 mg | ORAL_TABLET | Freq: Two times a day (BID) | ORAL | Status: DC
  Administered 2014-11-20 – 2014-11-30 (×20): 100 mg via ORAL
  Filled 2014-11-20 (×22): qty 1

## 2014-11-20 MED ORDER — BISACODYL 10 MG RE SUPP: 10.0000 mg | Freq: Every day | RECTAL | Status: DC | PRN

## 2014-11-20 NOTE — Discharge Summary (Signed)
Physician Discharge Summary  Patient ID: Luke ALLSUP Sr. MRN: [id number] DOB/AGE: Oct 11, 1946 68 y.o.  Admit date: 11/13/2014 Discharge date: 11/20/2014  Admission Diagnoses: Cervical spondylosis with myelopathy, C3-C7  Discharge Diagnoses: Same Active Problems:   Cervical spondylosis with myelopathy   Acute respiratory acidosis   Encounter for intubation   Encounter for orogastric (OG) tube placement   Surgery, other elective   Discharged Condition: Stable  Hospital Course:  [name] is a 68 y.o. male electively admitted after ACDF. Postoperatively, the patient was at neurologic baseline, however he was left intubated postoperatively because of his multiple medical comorbidities. The next morning, he was extubated without difficulty. He did report a significant amount of neck and bilateral arm pain which she responded very well to administration of steroids. He was ultimately transferred from the ICU after a proximally 4 days, to the general neuroscience floor. He was evaluated by physical and occupational therapy as well as speech therapy. Initially, the patient was aspirating all consistent C's, however subsequent speech evaluation cleared him for a diet. Physical and occupational therapy evaluation deemed him a good candidate for comprehensive inpatient rehabilitation. He remained hemodynamically stable, neurologically well, albeit with some mild confusion. He was therefore stable for transfer to rehabilitation.   Treatments: Surgery - ACDF C3-4, C4-5, C5-6, C6-7  Discharge Exam: Blood pressure 143/64, pulse 96, temperature 98.6 F (37 C), temperature source Oral, resp. rate 17, height 5\' 5"  (1.651 m), weight 91.5 kg (201 lb 11.5 oz), SpO2 98 %. Awake, alert, oriented Speech fluent, appropriate CN grossly intact 5/5 BUE/BLE Wound c/d/i  Follow-up: Follow-up in my office [facility] [phone]) in 2-3 weeks  Disposition: CIR      Medication List    TAKE these medications        allopurinol 100 MG tablet  Commonly known as:  ZYLOPRIM  Take 100 mg by mouth 2 (two) times daily.     amLODipine 10 MG tablet  Commonly known as:  NORVASC  Take 10 mg by mouth daily.     brimonidine-timolol 0.2-0.5 % ophthalmic solution  Commonly known as:  COMBIGAN  Place 1 drop into the left eye every 12 (twelve) hours.     cholestyramine 4 G packet  Commonly known as:  QUESTRAN  Take 4 g by mouth 3 (three) times daily.     colchicine 0.6 MG tablet  Take 0.6 mg by mouth daily as needed (gout).     diazepam 5 MG tablet  Commonly known as:  VALIUM  Take 1 tablet (5 mg total) by mouth every 6 (six) hours as needed for muscle spasms.     ferrous sulfate 325 (65 FE) MG tablet  Take 325 mg by mouth 3 (three) times daily with meals.     furosemide 40 MG tablet  Commonly known as:  LASIX  Take 40 mg by mouth daily.     insulin aspart 100 UNIT/ML FlexPen  Commonly known as:  NOVOLOG  Inject into the skin 3 (three) times daily with meals. Sliding scale     insulin glargine 100 UNIT/ML injection  Commonly known as:  LANTUS  Inject 80 Units into the skin at bedtime.     lactulose 10 GM/15ML solution  Commonly known as:  CHRONULAC  Take 30 g by mouth 2 (two) times daily as needed for mild constipation.     methocarbamol 500 mg in dextrose 5 % 50 mL  Inject 500 mg into the vein every 6 (  six) hours as needed.     nadolol 20 MG tablet  Commonly known as:  CORGARD  Take 20 mg by mouth daily.     omeprazole 20 MG capsule  Commonly known as:  PRILOSEC  Take 20 mg by mouth 2 (two) times daily before a meal.     Oxycodone HCl 10 MG Tabs  Take 1 tablet (10 mg total) by mouth every 4 (four) hours as needed for moderate pain.     pantoprazole 40 MG tablet  Commonly known as:  PROTONIX  Take 40 mg by mouth 2 (two) times daily.     rifaximin 550 MG Tabs tablet  Commonly known as:  XIFAXAN  Take 550 mg by mouth 2 (two) times  daily.     sertraline 50 MG tablet  Commonly known as:  ZOLOFT  Take 50 mg by mouth daily.     traMADol 50 MG tablet  Commonly known as:  ULTRAM  Take 50 mg by mouth every 6 (six) hours as needed for moderate pain.     Vitamin D-3 1000 UNITS Caps  Take 1,000 Units by mouth daily.         SignedConsuella Lose, C 11/20/2014, 12:28 PM

## 2014-11-20 NOTE — Progress Notes (Signed)
Received pt. As a transfer from [place].Pt. Was oriented to unit and routine.Safety plan was explained,fall prevention plan was explained(pt. Was unable to sign due to blindness).Safety video was played and pt. Was able to listen to it.Pt. Did not have any skin breakdown.Surgical incision on anterior neck.Keep monitoring pt. Closely and assessing his needs.

## 2014-11-20 NOTE — Progress Notes (Signed)
Discharge orders received, pt for discharge toCIR, IV D/C with incision CDI to anterior neck.  Report called to rehab nurse. Staff brought pt to CIR via wheelchair.

## 2014-11-20 NOTE — Progress Notes (Signed)
Physical Therapy Treatment Patient Details Name: Luke GIVLER Sr. MRN: [id number] DOB: January 11, 1946 Today's Date: 11/20/2014    History of Present Illness pt presents with C3-7 ACDF intubated 11/24- 11/25.      PT Comments    Walked into patients room as he was attempting to get OOB to go to the restroom. Patient agreeable to ambulation and able to increased distance slightly. Did note some fatigue but overall progressing well. Still needing assist due to visual deficits. Continue to recommend comprehensive inpatient rehab (CIR) for post-acute therapy needs.   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 Required Braces or Orthoses: Cervical Brace Cervical Brace: Hard collar;At all times Restrictions Weight Bearing Restrictions: No    Mobility  Bed Mobility Overal bed mobility: Needs Assistance       Supine to sit: Min assist     General bed mobility comments: Min A to steady patient up into sitting and to ensure balance. Cues for positioning  Transfers Overall transfer level: Needs assistance Equipment used: Rolling walker (2 wheeled)   Sit to Stand: Min assist         General transfer comment: Min A to ensure balance with stand. Patient with initial posterior lean but with cues able to correct. Patient needed cues for safe hand placement and not to pull up from RW  Ambulation/Gait Ambulation/Gait assistance: Min assist Ambulation Distance (Feet): 150 Feet Assistive device: Rolling walker (2 wheeled) Gait Pattern/deviations: Step-through pattern;Decreased stride length     General Gait Details: cues for upright posture and for RW safety. patient needed assistance to [employer] and avoid  obstacles   Financial trader [employer] (Stroke Patients Only)       Balance Overall balance assessment: Needs assistance   Sitting balance-Leahy Scale: Fair       Standing balance-Leahy Scale: Fair Standing balance comment: Patient able to stand and wash hands at sink but requires assistance to ensure balance. Cues for postioning                     Cognition Arousal/Alertness: Awake/alert Behavior During Therapy: WFL for tasks assessed/performed Overall Cognitive Status: Impaired/Different from baseline Area of Impairment: Safety/judgement     Memory: Decreased recall of precautions   Safety/Judgement: Decreased awareness of safety          Exercises      General Comments        Pertinent Vitals/Pain Pain Score: 3  Pain Location: neck Pain Descriptors / Indicators: Sore Pain Intervention(s): Monitored during session;Repositioned    Home Living                      Prior Function            PT Goals (current goals can now be found in the care plan section) Progress towards PT goals: Progressing toward goals    Frequency  Min 4X/week    PT Plan Current plan remains appropriate    Co-evaluation             End of Session Equipment Utilized During Treatment: Gait belt Activity Tolerance: Patient tolerated treatment well Patient left:  in chair;with call bell/phone within reach;with chair alarm set     Time: [phone] PT Time Calculation (min) (ACUTE ONLY): 17 min  Charges:  $Gait Training: 8-22 mins                    G Codes:      Jacqualyn Posey 11/20/2014, 2:01 PM  11/20/2014 Jacqualyn Posey PTA [phone] pager [phone] office

## 2014-11-20 NOTE — Progress Notes (Signed)
Rehab admissions - I did receive medical clearance from Dr. Kathyrn Sheriff and bed is available. We will admit pt to inpatient rehab later today.  I spoke with pt's wife earlier and pt's wife was not in room when I returned this afternoon. I discussed admission paperwork with wife earlier today and completed paperwork with pt this afternoon.    I called and left voicemail for wife/dtr updating them that we will be admitting pt to inpatient rehab later today.  I updated Edwin Cap, case Merchant navy officer, Education officer, museum. Pt's Rn is aware as well. Thanks.  Nanetta Batty, PT Rehabilitation Admissions Coordinator [phone]

## 2014-11-20 NOTE — Anesthesia Postprocedure Evaluation (Signed)
  Anesthesia Post-op Note  Patient: Luke AVAKIAN Sr.  Procedure(s) Performed: Procedure(s):  Cervical three-four, Cervical four-five, Cervical five-six, Cervical six- seven anterior cervical decompression with fusion and interbody prosthesis plating and bonegraft (N/A)  Patient Location: PACU  Anesthesia Type:General  Level of Consciousness: sedated and Patient remains intubated per anesthesia plan  Airway and Oxygen Therapy: Patient Spontanous Breathing  Post-op Pain: none  Post-op Assessment: Patient's Cardiovascular Status Stable and Respiratory Function Stable  Post-op Vital Signs: stable  Last Vitals:  Filed Vitals:   11/20/14 0207  BP: 155/70  Pulse: 76  Temp: 36.6 C  Resp: 18    Complications: No apparent anesthesia complications

## 2014-11-20 NOTE — Plan of Care (Signed)
Problem: Consults Goal: Diabetes Guidelines if Diabetic/Glucose > 140 If diabetic or lab glucose is > 140 mg/dl - Initiate Diabetes/Hyperglycemia Guidelines & Document Interventions  Blood sugars under normal range,with medicine  Problem: RH SKIN INTEGRITY Goal: RH STG SKIN FREE OF INFECTION/BREAKDOWN Outcome: Progressing

## 2014-11-20 NOTE — Hospital Discharge Follow-Up (Signed)
Physical Medicine and Rehabilitation Admission H&P   CC: Cervical myelopathy  HPI: Luke EHLY Sr. is a 68 y.o. male with history of HTN, DM type 2, NASH, COPD, who has had problems gait disorder with numbness BLE and BUE with difficulty walking since June. Work up with significant cervical spondylitic changes with large osteophytes C3-C7 with cord compression but sugery few months ago was cancelled due to anemia with significant thrombocytopenia. He was evaluated by hematology and was recently cleared for surgery with recommendations to follow platelets. He was admitted on 11/13/14 for ACDF C3-C7 by Dr. Kathyrn Sheriff. Extubated on 11/25 and PCCM following for input. NPO recommended initially by ST due to overt signs of aspiration and suspicion of post op edema. Therapy ongoing in addition to dysphagia treatment and he was started on dysphagia 1 thin liquids. Neck and shoulder pain improving and respiratory status stable. CIR recommended by rehab team and MD for follow up therapy   Review of Systems  Eyes: Positive for blurred vision.  Respiratory: Negative for cough and shortness of breath.  Cardiovascular: Negative for chest pain.  Gastrointestinal: Negative for heartburn, nausea, abdominal pain and constipation.  Genitourinary: Negative for urgency and frequency.  Musculoskeletal: Positive for myalgias.  Neurological: Positive for sensory change and weakness. Negative for headaches.    Review of Systems  Eyes: Positive for blurred vision.   Blind in right eye  Respiratory: Negative for cough and shortness of breath.  Cardiovascular: Negative for chest pain and palpitations.  Gastrointestinal: Positive for constipation (chronic without lactulose). Negative for heartburn and nausea.  Genitourinary: Negative for dysuria and urgency.  Musculoskeletal: Negative for back pain and joint pain.  Neurological: Positive for tingling (bilateral hands and RLE), focal weakness  and weakness. Negative for dizziness.      Past Medical History  Diagnosis Date  . Stroke   . Hypertension   . COPD (chronic obstructive pulmonary disease)   . Diabetes mellitus without complication   . H/O hiatal hernia   . Depression   . Arthritis   . Hepatitis     NASH  . Cirrhosis of liver not due to alcohol     Past Surgical History  Procedure Laterality Date  . Nose surgery    . Appendectomy    . Cardiac catheterization    . Eye surgery      leaglly blind  . Anterior cervical decompression/discectomy fusion 4 levels N/A 11/13/2014    Procedure: Cervical three-four, Cervical four-five, Cervical five-six, Cervical six- seven anterior cervical decompression with fusion and interbody prosthesis plating and bonegraft; Surgeon: Consuella Lose, MD; Location: [facility]; Service: Neurosurgery; Laterality: N/A;    History reviewed. No pertinent family history.    Social History: Married. 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 Per reports that he has never smoked. He does not have any smokeless tobacco history on file. He reports that he does not drink alcohol or use illicit drugs.      Allergies  Allergen Reactions  . Actos [Pioglitazone] Anaphylaxis  . Amitriptyline     Unknown   . Hctz [Hydrochlorothiazide]     Unknown   . Naproxen     Unknown   . Penicillins Rash    rash   Medications Prior to Admission  Medication Sig Dispense Refill  . allopurinol (ZYLOPRIM) 100 MG tablet Take 100 mg by mouth 2 (two) times daily.    Marland Kitchen amLODipine (NORVASC) 10 MG tablet Take 10 mg  by mouth daily.    . brimonidine-timolol (COMBIGAN) 0.2-0.5 % ophthalmic solution Place 1 drop into the left eye every 12 (twelve) hours.    . Cholecalciferol (VITAMIN D-3) 1000 UNITS CAPS Take 1,000 Units by mouth daily.     .  cholestyramine (QUESTRAN) 4 G packet Take 4 g by mouth 3 (three) times daily.    . colchicine 0.6 MG tablet Take 0.6 mg by mouth daily as needed (gout).    . ferrous sulfate 325 (65 FE) MG tablet Take 325 mg by mouth 3 (three) times daily with meals.    . furosemide (LASIX) 40 MG tablet Take 40 mg by mouth daily.    . insulin aspart (NOVOLOG) 100 UNIT/ML FlexPen Inject into the skin 3 (three) times daily with meals. Sliding scale    . insulin glargine (LANTUS) 100 UNIT/ML injection Inject 80 Units into the skin at bedtime.    Marland Kitchen lactulose (CHRONULAC) 10 GM/15ML solution Take 30 g by mouth 2 (two) times daily as needed for mild constipation.    . nadolol (CORGARD) 20 MG tablet Take 20 mg by mouth daily.    . pantoprazole (PROTONIX) 40 MG tablet Take 40 mg by mouth 2 (two) times daily.    . rifaximin (XIFAXAN) 550 MG TABS tablet Take 550 mg by mouth 2 (two) times daily.    . sertraline (ZOLOFT) 50 MG tablet Take 50 mg by mouth daily.    . traMADol (ULTRAM) 50 MG tablet Take 50 mg by mouth every 6 (six) hours as needed for moderate pain.    Marland Kitchen omeprazole (PRILOSEC) 20 MG capsule Take 20 mg by mouth 2 (two) times daily before a meal.      Home: Home Living Family/patient expects to be discharged to:: Private residence Living Arrangements: Spouse/significant other Available Help at Discharge: Family, Available 24 hours/day Type of Home: Apartment Home Access: Level entry Home Layout: One level Home Equipment: Walker - 2 wheels, Cane - single point Additional Comments: Need to confirm home set-up as pt with some cognitive deficits.   Functional History: Prior Function Level of Independence: Independent with assistive device(s)  Functional Status:  Mobility: Bed Mobility Overal bed mobility: Needs Assistance Bed Mobility: Rolling, Sidelying to Sit Rolling: Mod assist Sidelying to sit: Mod assist Supine to sit: Mod  assist Sit to supine: Min assist General bed mobility comments: Patient up in recliner before and after session Transfers Overall transfer level: Needs assistance Equipment used: Rolling walker (2 wheeled) Transfers: Sit to/from Stand Sit to Stand: Min assist General transfer comment: cues for hand placement with assist to stand and mod assist to control descent to surface Ambulation/Gait Ambulation/Gait assistance: Min assist Ambulation Distance (Feet): 120 Feet Assistive device: Rolling walker (2 wheeled) Gait Pattern/deviations: Step-through pattern, Decreased stride length Gait velocity: decreased Gait velocity interpretation: Below normal speed for age/gender General Gait Details: Patient able to grasp RW this session but required A to manuever around objects and for balance. Cues for upright posture    ADL:    Cognition: Cognition Overall Cognitive Status: Impaired/Different from baseline Orientation Level: Oriented to person, Oriented to place, Disoriented to time, Disoriented to situation Cognition Arousal/Alertness: Awake/alert Behavior During Therapy: WFL for tasks assessed/performed Overall Cognitive Status: Impaired/Different from baseline Area of Impairment: Orientation, Safety/judgement Orientation Level: Time Memory: Decreased recall of precautions Safety/Judgement: Decreased awareness of safety  Physical Exam: Blood pressure 143/64, pulse 96, temperature 98.6 F (37 C), temperature source Oral, resp. rate 17, height 5\' 5"  (1.651 m), weight 91.5  kg (201 lb 11.5 oz), SpO2 98 %. Physical Exam Nursing note and vitals reviewed. Constitutional: He is oriented to person, place, and time. He appears well-developed and well-nourished. Verbose HENT:  Head: Normocephalic and atraumatic.   Eyes: Conjunctivae are normal. Pupils are equal, round, and reactive to light.  Neck:  Immobilized in collar. Incision clean, dry and intact. +bruising and edema about neck/op  site Cardiovascular: Normal rate and regular rhythm.  No murmur heard. Respiratory: Effort normal and breath sounds normal.  GI: Distended and tympanic (ascites?). Diastasis recti evident. Bowel sounds are normal.  Musculoskeletal: He exhibits no edema or tenderness.  Neurological: He is alert and oriented to person, place, and time.  Mild dysarthria as edentulous. Able to follow commands without difficulty. Sensory deficits BLE and bilateral upper exts and trunk---1+/2. UES grossly 5/5 proximal to distal. LE's 4/5 HF, KE to 5/5 distally. He is a bit impulsive and safety judgement impaired. Impaired balance.  Skin: Skin is warm and dry. Ecchymotic areas on BUE. BLE with excoriated areas.  Psychiatric: He has a normal mood and affect. His behavior is normal.     Lab Results Last 48 Hours    Results for orders placed or performed during the hospital encounter of 11/13/14 (from the past 48 hour(s))  Glucose, capillary Status: Abnormal   Collection Time: 11/18/14 4:07 PM  Result Value Ref Range   Glucose-Capillary 268 (H) 70 - 99 mg/dL   Comment 1 Documented in Chart   Glucose, capillary Status: Abnormal   Collection Time: 11/18/14 8:40 PM  Result Value Ref Range   Glucose-Capillary 123 (H) 70 - 99 mg/dL   Comment 1 Notify RN    Comment 2 Documented in Chart   Glucose, capillary Status: Abnormal   Collection Time: 11/19/14 12:23 AM  Result Value Ref Range   Glucose-Capillary 156 (H) 70 - 99 mg/dL   Comment 1 Notify RN    Comment 2 Documented in Chart   Glucose, capillary Status: Abnormal   Collection Time: 11/19/14 4:58 AM  Result Value Ref Range   Glucose-Capillary 149 (H) 70 - 99 mg/dL  Glucose, capillary Status: Abnormal   Collection Time: 11/19/14 7:34 AM  Result Value Ref Range   Glucose-Capillary 121 (H) 70 - 99 mg/dL  Glucose, capillary Status: Abnormal   Collection  Time: 11/19/14 11:50 AM  Result Value Ref Range   Glucose-Capillary 267 (H) 70 - 99 mg/dL   Comment 1 Documented in Chart   Glucose, capillary Status: Abnormal   Collection Time: 11/19/14 4:21 PM  Result Value Ref Range   Glucose-Capillary 202 (H) 70 - 99 mg/dL   Comment 1 Documented in Chart    Comment 2 Notify RN   Glucose, capillary Status: Abnormal   Collection Time: 11/19/14 9:20 PM  Result Value Ref Range   Glucose-Capillary 233 (H) 70 - 99 mg/dL   Comment 1 Documented in Chart    Comment 2 Notify RN   Glucose, capillary Status: Abnormal   Collection Time: 11/20/14 6:31 AM  Result Value Ref Range   Glucose-Capillary 242 (H) 70 - 99 mg/dL   Comment 1 Documented in Chart    Comment 2 Notify RN   Glucose, capillary Status: Abnormal   Collection Time: 11/20/14 11:43 AM  Result Value Ref Range   Glucose-Capillary 357 (H) 70 - 99 mg/dL   Comment 1 Notify RN       Imaging Results (Last 48 hours)    No results found.  Medical Problem List and Plan: 1. Functional deficits secondary to Cervical spondylosis with myelopathy s/p decompressive lam.  2. DVT Prophylaxis/Anticoagulation: Mechanical: Sequential compression devices, below knee Bilateral lower extremities 3. Pain Management: prn oxycodone effective.  4. Mood: Will continue zoloft daily. LCSW to follow for evaluation and support.  5. Neuropsych: This patient is capable of making decisions on his own behalf. 6. Skin/Wound Care: Routine pressure relief measures.  7. Fluids/Electrolytes/Nutrition: Monitor I/O.  8. DM type 2 with retinopathy: Monitor BS with ac/hs checks. Resume lantus at 40 units daily ( will titrate to home dose as needed) with SSI as needed for elevated BS.  9. NASH: Continue lactulose and Xifaxan bid. Check ammonia levels in am as has missed a couple of doses and wife reporting some  confusion. On questran to help with diarrhea.  10. COPD: Respiratory status stable.  11. HTN: will monitor tid. Continue Corgard, Lasix and Norvasc.  12. Glaucoma: Continue Timoptic and Alphagan bid.  13. Thrombocytopenia: Recheck levels in am--baseline in 50's  14. Chronic anemia: Will recheck H/H in am to monitor for stability.     Post Admission Physician Evaluation: 1. Functional deficits secondary to Cervical spondylosis with myelopathy s/p decompressive lam. 2. Patient is admitted to receive collaborative, interdisciplinary care between the physiatrist, rehab nursing staff, and therapy team. 3. Patient's level of medical complexity and substantial therapy needs in context of that medical necessity cannot be provided at a lesser intensity of care such as a SNF. 4. Patient has experienced substantial functional loss from his/her baseline which was documented above under the "Functional History" and "Functional Status" headings. Judging by the patient's diagnosis, physical exam, and functional history, the patient has potential for functional progress which will result in measurable gains while on inpatient rehab. These gains will be of substantial and practical use upon discharge in facilitating mobility and self-care at the household level. 5. Physiatrist will provide 24 hour management of medical needs as well as oversight of the therapy plan/treatment and provide guidance as appropriate regarding the interaction of the two. 6. 24 hour rehab nursing will assist with bladder management, bowel management, safety, skin/wound care, disease management, medication administration, pain management and patient education and help integrate therapy concepts, techniques,education, etc. 7. PT will assess and treat for/with: Lower extremity strength, range of motion, stamina, balance, functional mobility, safety, adaptive techniques and equipment, NMR, visual-spatial awareness, pain mgt. Goals  are: mod I. 8. OT will assess and treat for/with: ADL's, functional mobility, safety, upper extremity strength, adaptive techniques and equipment, NMR, visual-spatial awareness, balance, ego support. Goals are: mod I. Therapy may proceed with showering this patient. 9. SLP will assess and treat for/with: swallowing, communication. Goals are: mod I. 10. Case Management and Social Worker will assess and treat for psychological issues and discharge planning. 11. Team conference will be held weekly to assess progress toward goals and to determine barriers to discharge. 12. Patient will receive at least 3 hours of therapy per day at least 5 days per week. 13. ELOS: 7-10 days  14. Prognosis: excellent     Meredith Staggers, MD, [place] [facility] 11/20/2014

## 2014-11-20 NOTE — Progress Notes (Signed)
Rehab admissions - I am following pt's case and contacted Dr. Nundkumar this am to seek medical clearance for pt to be admitted to inpatient rehab. Dr. Nundkumar is to see pt later this am and will get back to me. I then met with pt and his wife this am. Pt's wife is deaf but was able to read lips and communicate with me. I explained that we are waiting on physician clearance. Further questions were answered and she is in support of inpatient rehab.  We will consider inpatient rehab admit later today pending pt's medical clearance as we have available beds.   I will keep the pt/family and medical team aware of any updates as I wait to hear back from Dr. Nundkumar.  I updated Alesia, case manager as well. Thanks.  Janine Turnington, PT Rehabilitation Admissions Coordinator [phone]  

## 2014-11-21 ENCOUNTER — Inpatient Hospital Stay (HOSPITAL_COMMUNITY): Payer: Medicare Other | Admitting: Speech Pathology

## 2014-11-21 ENCOUNTER — Inpatient Hospital Stay (HOSPITAL_COMMUNITY): Payer: Medicare Other

## 2014-11-21 ENCOUNTER — Inpatient Hospital Stay (HOSPITAL_COMMUNITY): Payer: Medicare Other | Admitting: Occupational Therapy

## 2014-11-21 DIAGNOSIS — E1165 Type 2 diabetes mellitus with hyperglycemia: Secondary | ICD-10-CM

## 2014-11-21 DIAGNOSIS — K7581 Nonalcoholic steatohepatitis (NASH): Secondary | ICD-10-CM | POA: Diagnosis present

## 2014-11-21 LAB — URINALYSIS, ROUTINE W REFLEX MICROSCOPIC
BILIRUBIN URINE: NEGATIVE
HGB URINE DIPSTICK: NEGATIVE
Ketones, ur: NEGATIVE mg/dL
Leukocytes, UA: NEGATIVE
Nitrite: NEGATIVE
PH: 5.5 (ref 5.0–8.0)
Protein, ur: NEGATIVE mg/dL
SPECIFIC GRAVITY, URINE: 1.021 (ref 1.005–1.030)
Urobilinogen, UA: 0.2 mg/dL (ref 0.0–1.0)

## 2014-11-21 LAB — COMPREHENSIVE METABOLIC PANEL
ALBUMIN: 3 g/dL — AB (ref 3.5–5.2)
ALK PHOS: 100 U/L (ref 39–117)
ALT: 23 U/L (ref 0–53)
AST: 21 U/L (ref 0–37)
Anion gap: 11 (ref 5–15)
BUN: 15 mg/dL (ref 6–23)
CO2: 27 mEq/L (ref 19–32)
Calcium: 8.4 mg/dL (ref 8.4–10.5)
Chloride: 99 mEq/L (ref 96–112)
Creatinine, Ser: 0.7 mg/dL (ref 0.50–1.35)
GFR calc Af Amer: >90 mL/min (ref >90)
GFR calc non Af Amer: >90 mL/min (ref >90)
Glucose, Bld: 221 mg/dL — ABNORMAL HIGH (ref 70–99)
POTASSIUM: 4.3 meq/L (ref 3.7–5.3)
SODIUM: 137 meq/L (ref 137–147)
TOTAL PROTEIN: 7.1 g/dL (ref 6.0–8.3)
Total Bilirubin: 1.1 mg/dL (ref 0.3–1.2)

## 2014-11-21 LAB — URINE MICROSCOPIC-ADD ON

## 2014-11-21 LAB — CBC WITH DIFFERENTIAL/PLATELET
Basophils Absolute: 0 10*3/uL (ref 0.0–0.1)
Basophils Relative: 0 % (ref 0–1)
Eosinophils Absolute: 0.3 10*3/uL (ref 0.0–0.7)
Eosinophils Relative: 5 % (ref 0–5)
HCT: 31.2 % — ABNORMAL LOW (ref 39.0–52.0)
Hemoglobin: 10.4 g/dL — ABNORMAL LOW (ref 13.0–17.0)
LYMPHS ABS: 0.7 10*3/uL (ref 0.7–4.0)
Lymphocytes Relative: 13 % (ref 12–46)
MCH: 30.6 pg (ref 26.0–34.0)
MCHC: 33.3 g/dL (ref 30.0–36.0)
MCV: 91.8 fL (ref 78.0–100.0)
Monocytes Absolute: 0.4 10*3/uL (ref 0.1–1.0)
Monocytes Relative: 7 % (ref 3–12)
NEUTROS PCT: 74 % (ref 43–77)
Neutro Abs: 3.7 10*3/uL (ref 1.7–7.7)
PLATELETS: 50 10*3/uL — AB (ref 150–400)
RBC: 3.4 MIL/uL — AB (ref 4.22–5.81)
RDW: 15.7 % — ABNORMAL HIGH (ref 11.5–15.5)
WBC: 5 10*3/uL (ref 4.0–10.5)

## 2014-11-21 LAB — GLUCOSE, CAPILLARY
GLUCOSE-CAPILLARY: 292 mg/dL — AB (ref 70–99)
GLUCOSE-CAPILLARY: 343 mg/dL — AB (ref 70–99)
Glucose-Capillary: 237 mg/dL — ABNORMAL HIGH (ref 70–99)
Glucose-Capillary: 371 mg/dL — ABNORMAL HIGH (ref 70–99)

## 2014-11-21 LAB — AMMONIA: Ammonia: 35 umol/L (ref 11–60)

## 2014-11-21 MED ORDER — SERTRALINE HCL 25 MG PO TABS
25.0000 mg | ORAL_TABLET | Freq: Every day | ORAL | Status: DC
  Administered 2014-11-21 – 2014-11-30 (×10): 25 mg via ORAL
  Filled 2014-11-21 (×11): qty 1

## 2014-11-21 NOTE — Progress Notes (Signed)
Luke Staggers, MD Physician Signed Physical Medicine and Rehabilitation Consult Note 11/19/2014 10:40 AM  Related encounter: Admission (Discharged) from 11/13/2014 in [facility]        Physical Medicine and Rehabilitation Consult   Reason for Consult: Cervical myelopathy.  Referring Physician: Dr. Kathyrn Sheriff.    HPI: Luke DUVALL Sr. is a 68 y.o. male with history of HTN, DM type 2, NASH, COPD, who has had problems gait disorder with numbness BLE and BUE with difficulty walking since June. Work up with significant cervical spondylitic changes with large osteophytes C3-C7 with cord compression but sugery few months ago was cancelled due to anemia with significant thrombocytopenia. He was evaluated by hematology and was recently cleared for surgery with recommendations to follow platelets. He was admitted on 11/13/14 for ACDF C3-C7 by Dr. Kathyrn Sheriff. Extubated on 11/25 and PCCM following for input. NPO recommended initially by ST due to overt signs of aspiration and suspicion of post op edema. Therapy ongoing in addition to dysphagia treatment and he was started on dysphagia 1 thin liquids. Neck and shoulder pain improving and respiratory status stable. CIR recommended by rehab team and MD for follow up therapy.   Patient reports that he's feeling stronger since surgery and would prefer to go home with HHPT. Has a supportive family who can assist as needed.    Review of Systems  Eyes: Positive for blurred vision.   Blind in right eye  Respiratory: Negative for cough and shortness of breath.  Cardiovascular: Negative for chest pain and palpitations.  Gastrointestinal: Positive for constipation (chronic without lactulose). Negative for heartburn and nausea.  Genitourinary: Negative for dysuria and urgency.  Musculoskeletal: Negative for back pain and joint pain.  Neurological: Positive for tingling (bilateral  hands and RLE), focal weakness and weakness. Negative for dizziness.      Past Medical History  Diagnosis Date  . Stroke   . Hypertension   . COPD (chronic obstructive pulmonary disease)   . Diabetes mellitus without complication   . H/O hiatal hernia   . Depression   . Arthritis   . Hepatitis     NASH  . Cirrhosis of liver not due to alcohol     Past Surgical History  Procedure Laterality Date  . Nose surgery    . Appendectomy    . Cardiac catheterization    . Eye surgery      leaglly blind  . Anterior cervical decompression/discectomy fusion 4 levels N/A 11/13/2014    Procedure: Cervical three-four, Cervical four-five, Cervical five-six, Cervical six- seven anterior cervical decompression with fusion and interbody prosthesis plating and bonegraft; Surgeon: Consuella Lose, MD; Location: [facility]; Service: Neurosurgery; Laterality: N/A;    History reviewed. No pertinent family history.    Social History: Married. 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 Per reports that he has never smoked. He does not have any smokeless tobacco history on file. He reports that he does not drink alcohol or use illicit drugs.    Allergies  Allergen Reactions  . Actos [Pioglitazone] Anaphylaxis  . Amitriptyline     Unknown   . Hctz [Hydrochlorothiazide]     Unknown   . Naproxen     Unknown   . Penicillins Rash    rash    Medications Prior to Admission  Medication Sig Dispense Refill  . allopurinol (ZYLOPRIM) 100 MG tablet Take 100 mg by  mouth 2 (two) times daily.    Marland Kitchen amLODipine (NORVASC) 10 MG tablet Take 10 mg by mouth daily.    . brimonidine-timolol (COMBIGAN) 0.2-0.5 % ophthalmic solution Place 1 drop into the left eye every 12 (twelve) hours.    . Cholecalciferol (VITAMIN D-3) 1000 UNITS CAPS Take 1,000 Units by  mouth daily.     . cholestyramine (QUESTRAN) 4 G packet Take 4 g by mouth 3 (three) times daily.    . colchicine 0.6 MG tablet Take 0.6 mg by mouth daily as needed (gout).    . ferrous sulfate 325 (65 FE) MG tablet Take 325 mg by mouth 3 (three) times daily with meals.    . furosemide (LASIX) 40 MG tablet Take 40 mg by mouth daily.    . insulin aspart (NOVOLOG) 100 UNIT/ML FlexPen Inject into the skin 3 (three) times daily with meals. Sliding scale    . insulin glargine (LANTUS) 100 UNIT/ML injection Inject 80 Units into the skin at bedtime.    Marland Kitchen lactulose (CHRONULAC) 10 GM/15ML solution Take 30 g by mouth 2 (two) times daily as needed for mild constipation.    . nadolol (CORGARD) 20 MG tablet Take 20 mg by mouth daily.    . pantoprazole (PROTONIX) 40 MG tablet Take 40 mg by mouth 2 (two) times daily.    . rifaximin (XIFAXAN) 550 MG TABS tablet Take 550 mg by mouth 2 (two) times daily.    . sertraline (ZOLOFT) 50 MG tablet Take 50 mg by mouth daily.    . traMADol (ULTRAM) 50 MG tablet Take 50 mg by mouth every 6 (six) hours as needed for moderate pain.    Marland Kitchen omeprazole (PRILOSEC) 20 MG capsule Take 20 mg by mouth 2 (two) times daily before a meal.      Home: Home Living Family/patient expects to be discharged to:: Private residence Living Arrangements: Spouse/significant other Available Help at Discharge: Family, Available 24 hours/day Type of Home: Apartment Home Access: Level entry Home Layout: One level Home Equipment: Walker - 2 wheels, Cane - single point Additional Comments: Need to confirm home set-up as pt with some cognitive deficits.   Functional History: Prior Function Level of Independence: Independent with assistive device(s) Functional Status:  Mobility: Bed Mobility Overal bed mobility: Needs Assistance Bed Mobility: Rolling, Sidelying to Sit Rolling: Mod assist Sidelying to sit: Mod assist Supine  to sit: Mod assist Sit to supine: Min assist General bed mobility comments: cues for sequence with assist to rotate body and elevate trunk from surface with multimodal cues Transfers Overall transfer level: Needs assistance Equipment used: Rolling walker (2 wheeled) Transfers: Sit to/from Stand Sit to Stand: Mod assist, Min assist General transfer comment: cues for hand placement with assist to stand and mod assist to control descent to surface Ambulation/Gait Ambulation/Gait assistance: Mod assist, +2 safety/equipment Ambulation Distance (Feet): 150 Feet Assistive device: Rolling walker (2 wheeled) Gait Pattern/deviations: Step-through pattern, Decreased stride length, Drifts right/left Gait velocity interpretation: Below normal speed for age/gender General Gait Details: pt with difficulty maintaining grasp on RW with bil UE, drifting bil with running into objects multiple times throughout with gait speed too quick for pt safety with continued cues for steering RW as pt not attempting to scan his environment with limited vision    ADL:    Cognition: Cognition Overall Cognitive Status: Impaired/Different from baseline Orientation Level: Oriented to person, Oriented to place, Oriented to situation, Disoriented to time Cognition Arousal/Alertness: Awake/alert Behavior During Therapy: [facility] for tasks assessed/performed  Overall Cognitive Status: Impaired/Different from baseline Area of Impairment: Orientation, Safety/judgement Orientation Level: Time Memory: Decreased recall of precautions Safety/Judgement: Decreased awareness of safety  Blood pressure 152/63, pulse 88, temperature 98 F (36.7 C), temperature source Oral, resp. rate 18, height 5\' 5"  (1.651 m), weight 91.5 kg (201 lb 11.5 oz), SpO2 99 %. Physical Exam  Nursing note and vitals reviewed. Constitutional: He is oriented to person, place, and time. He appears well-developed and well-nourished.  HENT:  Head: Normocephalic  and atraumatic.  Bruising and swelling at operative site.  Eyes: Conjunctivae are normal. Pupils are equal, round, and reactive to light.  Neck:  Immobilized in collar  Cardiovascular: Normal rate and regular rhythm.  No murmur heard. Respiratory: Effort normal and breath sounds normal.  GI: Soft. Bowel sounds are normal.  Musculoskeletal: He exhibits no edema or tenderness.  Neurological: He is alert and oriented to person, place, and time.  Mild dysarthria as edentulous. Able to follow commands without difficulty. Sensory deficits RLE and bilateral hands. UES grossly 5/5. LE's 4/5 prox to 5/5 distally. He is a bit impulsive and safety judgement impaired. Impaired balance.  Skin: Skin is warm and dry.  Psychiatric: He has a normal mood and affect. His behavior is normal.     Lab Results Last 24 Hours    Results for orders placed or performed during the hospital encounter of 11/13/14 (from the past 24 hour(s))  Glucose, capillary Status: Abnormal   Collection Time: 11/18/14 11:22 AM  Result Value Ref Range   Glucose-Capillary 167 (H) 70 - 99 mg/dL  Glucose, capillary Status: Abnormal   Collection Time: 11/18/14 4:07 PM  Result Value Ref Range   Glucose-Capillary 268 (H) 70 - 99 mg/dL   Comment 1 Documented in Chart   Glucose, capillary Status: Abnormal   Collection Time: 11/18/14 8:40 PM  Result Value Ref Range   Glucose-Capillary 123 (H) 70 - 99 mg/dL   Comment 1 Notify RN    Comment 2 Documented in Chart   Glucose, capillary Status: Abnormal   Collection Time: 11/19/14 12:23 AM  Result Value Ref Range   Glucose-Capillary 156 (H) 70 - 99 mg/dL   Comment 1 Notify RN    Comment 2 Documented in Chart   Glucose, capillary Status: Abnormal   Collection Time: 11/19/14 4:58 AM  Result Value Ref Range   Glucose-Capillary 149 (H) 70 - 99 mg/dL  Glucose, capillary Status: Abnormal     Collection Time: 11/19/14 7:34 AM  Result Value Ref Range   Glucose-Capillary 121 (H) 70 - 99 mg/dL      Imaging Results (Last 48 hours)    No results found.    Assessment/Plan: Diagnosis: cervical spondylosis with myelopathy s/p decompression 1. Does the need for close, 24 hr/day medical supervision in concert with the patient's rehab needs make it unreasonable for this patient to be served in a less intensive setting? Yes 2. Co-Morbidities requiring supervision/potential complications: pain, safety, dysphagia 3. Due to bladder management, bowel management, safety, skin/wound care, disease management, medication administration, pain management and patient education, does the patient require 24 hr/day rehab nursing? Yes 4. Does the patient require coordinated care of a physician, rehab nurse, PT (1-2 hrs/day, 5 days/week), OT (1-2 hrs/day, 5 days/week) and SLP (1-2 hrs/day, 5 days/week) to address physical and functional deficits in the context of the above medical diagnosis(es)? Yes Addressing deficits in the following areas: balance, endurance, locomotion, strength, transferring, bowel/bladder control, bathing, dressing, feeding, grooming, toileting, cognition, speech, swallowing and psychosocial  support 5. Can the patient actively participate in an intensive therapy program of at least 3 hrs of therapy per day at least 5 days per week? Yes 6. The potential for patient to make measurable gains while on inpatient rehab is excellent 7. Anticipated functional outcomes upon discharge from inpatient rehab are modified independent with PT, modified independent with OT, modified independent with SLP. 8. Estimated rehab length of stay to reach the above functional goals is: 7 days 9. Does the patient have adequate social supports and living environment to accommodate these discharge functional goals? Yes 10. Anticipated D/C setting: Home 11. Anticipated post D/C treatments: [facility]  therapy 12. Overall Rehab/Functional Prognosis: excellent  RECOMMENDATIONS: This patient's condition is appropriate for continued rehabilitative care in the following setting: CIR Patient has agreed to participate in recommended program. Yes Note that insurance prior authorization may be required for reimbursement for recommended care.  Comment: Rehab Admissions Coordinator to follow up.  Thanks,  Luke Staggers, MD, [facility]     11/19/2014       Revision History     Date/Time User Provider Type Action   11/19/2014 2:19 PM Luke Staggers, MD Physician Sign   11/19/2014 2:17 PM Luke Staggers, MD Physician Share   11/19/2014 12:16 PM Bary Leriche, PA-C Physician Assistant Share   View Details Report       Routing History     Date/Time From To Method   11/19/2014 2:19 PM Luke Staggers, MD Luke Staggers, MD In Basket

## 2014-11-21 NOTE — Evaluation (Signed)
Speech Language Pathology Assessment and Plan  Patient Details  Name: [name] MRN: [id number] Date of Birth: 11/13/1946  SLP Diagnosis: Dysphagia  Rehab Potential: Good ELOS: 1-2 weeks for SLP    Today's Date: 11/21/2014 SLP Individual Time: 0830-0930 SLP Individual Time Calculation (min): 60 min   Problem List:  Patient Active Problem List   Diagnosis Date Noted  . NASH (nonalcoholic steatohepatitis) 11/21/2014  . Stenosis of cervical spine region 11/20/2014  . Type 2 diabetes mellitus, uncontrolled, with retinopathy 11/20/2014  . History of COPD 11/20/2014  . Diabetes mellitus with diabetic retinopathy   . Acute respiratory acidosis   . Encounter for intubation   . Encounter for orogastric (OG) tube placement   . Surgery, other elective   . Cervical spondylosis with myelopathy 11/13/2014   Past Medical History:  Past Medical History  Diagnosis Date  . Stroke   . Hypertension   . COPD (chronic obstructive pulmonary disease)   . Diabetes mellitus without complication   . H/O hiatal hernia   . Depression   . Arthritis   . Hepatitis     NASH  . Cirrhosis of liver not due to alcohol   . Obstructive sleep apnea 11/20/2014   Past Surgical History:  Past Surgical History  Procedure Laterality Date  . Nose surgery    . Appendectomy    . Cardiac catheterization    . Eye surgery      leaglly blind  . Anterior cervical decompression/discectomy fusion 4 levels N/A 11/13/2014    Procedure:  Cervical three-four, Cervical four-five, Cervical five-six, Cervical six- seven anterior cervical decompression with fusion and interbody prosthesis plating and bonegraft;  Surgeon: Consuella Lose, MD;  Location: [place];  Service: Neurosurgery;  Laterality: N/A;    Assessment / Plan / Recommendation Clinical Impression Luke DUBE Sr. is a 68 y.o. male with history of HTN, DM type 2, NASH, COPD, who has had problems gait disorder with numbness BLE and BUE with  difficulty walking since June. Work up with significant cervical spondylitic changes with large osteophytes C3-C7 with cord compression but sugery few months ago was cancelled due to anemia with significant thrombocytopenia.  He was evaluated by hematology and was recently cleared for surgery with recommendations to follow platelets.  He was admitted on 11/13/14 for ACDF C3-C7 by Dr. Kathyrn Sheriff. Extubated on 11/25 and PCCM following for input. NPO recommended initially by ST due to overt signs of aspiration and suspicion of post op edema. Therapy ongoing in addition to dysphagia treatment and he was started on dysphagia 1 thin liquids. Neck and shoulder pain improving and respiratory status stable. CIR recommended by rehab team and MD for follow up therapy; patient admitted 11/20/14.  Orders received for bedside swallow evaluation.  Patient continues to demonstrate a pharyngeal phase dysphagia characterized by multiple swallow but no overt s/s with Dys.1 textures ad thin liquids and delayed throat clears with Dys.3 trials.  As a result, recommend to continue with current diet orders and that patient receive skilled SLP services to address dysphagia prior to discharge home with wife in order to reduce restrictions and overall burden of care.  While orders for cognitive-linguist evaluation were not received SLP suspects patient is close to baseline per his report.          Skilled Therapeutic Interventions          Bedside swallow evaluation completed with results and recommendations reviewed with patient.  SLP also initiated treatment to address hard effort  swallows with advanced Dys.2 textures and alternating solids and liquids which resulted in throat clear in 1/3 trials. Recommend advanced Dys 2 texture trial tray with thin liquids for meal tomorrow to assess for readiness.       SLP Assessment  Patient will need skilled Speech Lanaguage Pathology Services during CIR admission    Recommendations  Diet  Recommendations: Dysphagia 1 (Puree);Thin liquid Liquid Administration via: Cup;Straw Medication Administration: Crushed with puree Supervision: Patient able to self feed;Intermittent supervision to cue for compensatory strategies Compensations: Slow rate;Small sips/bites Postural Changes and/or Swallow Maneuvers: Seated upright 90 degrees;Upright 30-60 min after meal Oral Care Recommendations: Oral care BID Patient destination: Home Follow up Recommendations: None Equipment Recommended: None recommended by SLP    SLP Frequency 5 out of 7 days   SLP Treatment/Interventions Cueing hierarchy;Dysphagia/aspiration precaution training;Environmental controls;Functional tasks;Patient/family education;Other (comment) (Pharyngeal strengthening exercises)    Pain Pain Assessment Pain Assessment: No/denies pain Pain Score: 0-No pain Prior Functioning Cognitive/Linguistic Baseline: Baseline deficits Baseline deficit details: wife managed money and medications; visual impairments contribute to disorientation and difficulty poblem solving  Lives With: Spouse Available Help at Discharge: Family;Available 24 hours/day Vocation: On disability  Short Term Goals: Week 1: SLP Short Term Goal 1 (Week 1): Patient will consume trial tray of Dys.2 textures and thin liquids with Supervision cues to utilize safe swallow strategies to minimize overt s/s of aspiration.  SLP Short Term Goal 2 (Week 1): Patient will perform pharyngeal strengthening exercises with Min verbal cues  See FIM for current functional status Refer to Care Plan for Long Term Goals  Recommendations for other services: None  Discharge Criteria: Patient will be discharged from SLP if patient refuses treatment 3 consecutive times without medical reason, if treatment goals not met, if there is a change in medical status, if patient makes no progress towards goals or if patient is discharged from hospital.  The above assessment, treatment  plan, treatment alternatives and goals were discussed and mutually agreed upon: by patient  [name], M.A., CCC-SLP [phone]  [place] 11/21/2014, 10:01 AM

## 2014-11-21 NOTE — Evaluation (Signed)
Occupational Therapy Assessment and Plan  Patient Details  Name: [name] MRN: [id number] Date of Birth: 06/04/1946  OT Diagnosis: blindness and low vision and muscle weakness (generalized) Rehab Potential: Rehab Potential (ACUTE ONLY): Good ELOS: 7-10 days   Today's Date: 11/21/2014 OT Individual Time: 1100-1200 OT Individual Time Calculation (min): 60 min     Problem List:  Patient Active Problem List   Diagnosis Date Noted  . NASH (nonalcoholic steatohepatitis) 11/21/2014  . Stenosis of cervical spine region 11/20/2014  . Type 2 diabetes mellitus, uncontrolled, with retinopathy 11/20/2014  . History of COPD 11/20/2014  . Diabetes mellitus with diabetic retinopathy   . Acute respiratory acidosis   . Encounter for intubation   . Encounter for orogastric (OG) tube placement   . Surgery, other elective   . Cervical spondylosis with myelopathy 11/13/2014    Past Medical History:  Past Medical History  Diagnosis Date  . Stroke   . Hypertension   . COPD (chronic obstructive pulmonary disease)   . Diabetes mellitus without complication   . H/O hiatal hernia   . Depression   . Arthritis   . Hepatitis     NASH  . Cirrhosis of liver not due to alcohol   . Obstructive sleep apnea 11/20/2014   Past Surgical History:  Past Surgical History  Procedure Laterality Date  . Nose surgery    . Appendectomy    . Cardiac catheterization    . Eye surgery      leaglly blind  . Anterior cervical decompression/discectomy fusion 4 levels N/A 11/13/2014    Procedure:  Cervical three-four, Cervical four-five, Cervical five-six, Cervical six- seven anterior cervical decompression with fusion and interbody prosthesis plating and bonegraft;  Surgeon: Consuella Lose, MD;  Location: [facility];  Service: Neurosurgery;  Laterality: N/A;    Assessment & Plan Clinical Impression: Luke JAROS Sr. is a 68 y.o. male with history of HTN, DM type 2, NASH, COPD, who has had problems  gait disorder with numbness BLE and BUE with difficulty walking since June. Work up with significant cervical spondylitic changes with large osteophytes C3-C7 with cord compression but sugery few months ago was cancelled due to anemia with significant thrombocytopenia. He was evaluated by hematology and was recently cleared for surgery with recommendations to follow platelets. He was admitted on 11/13/14 for ACDF C3-C7 by Dr. Kathyrn Sheriff. Extubated on 11/25 and PCCM following for input. NPO recommended initially by ST due to overt signs of aspiration and suspicion of post op edema. Therapy ongoing in addition to dysphagia treatment and he was started on dysphagia 1 thin liquids. Neck and shoulder pain improving and respiratory status stable. CIR recommended by rehab team and MD for follow up therapy. Patient transferred to CIR on 11/20/2014 .    Patient currently requires min with basic self-care skills secondary to muscle weakness, decreased visual acuity and decreased sitting balance, decreased standing balance and decreased balance strategies.  Prior to hospitalization, patient could complete ADLs and IADLs with modified independent .  Patient will benefit from skilled intervention to increase independence with basic self-care skills prior to discharge home with care partner.  Anticipate patient will require 24 hour supervision and follow up home health.  OT - End of Session Activity Tolerance: Tolerates 30+ min activity with multiple rests Endurance Deficit: Yes OT Assessment Rehab Potential (ACUTE ONLY): Good OT Patient demonstrates impairments in the following area(s): Balance;Vision;Cognition;Endurance;Motor;Safety;Skin Integrity OT Basic ADL's Functional Problem(s): Grooming;Bathing;Dressing;Toileting OT Transfers Functional Problem(s): Toilet;Tub/Shower OT Plan  OT Intensity: Minimum of 1-2 x/day, 45 to 90 minutes OT Frequency: 5 out of 7 days OT Duration/Estimated Length of Stay: 7-10  days OT Treatment/Interventions: Balance/vestibular training;Community reintegration;Cognitive remediation/compensation;Discharge planning;DME/adaptive equipment instruction;Patient/family education;Psychosocial support;Self Care/advanced ADL retraining;Therapeutic Activities;Therapeutic Exercise;Skin care/wound managment;UE/LE Strength taining/ROM;UE/LE Coordination activities;Visual/perceptual remediation/compensation OT Basic Self-Care Anticipated Outcome(s): Supervision OT Toileting Anticipated Outcome(s): Supervision OT Bathroom Transfers Anticipated Outcome(s): Supervision OT Recommendation Patient destination: Home Follow Up Recommendations:  (TBD) Equipment Recommended: To be determined   Skilled Therapeutic Intervention Initial OT evaluation completed. Skilled OT session focusing on ADL retraining, functional transfers, sit<>stand transfers, and activity tolerance/endurance. Pt seated in w/c when arriving with mother and daughter leaving, reporting no pain. Pt interview completed. Pt requiring redirection or repeated questions at times, as he often answered with topics that were not relevant to question asked. Unsure of pt cognitive status prior to rehab, plan to discuss further with family when available. Pt transferred to toilet using RW with min A and mod v/c for locating toilet and turning in right direction (due to decreased vision). Pt completed UB/LB bathing and dressing at sink, requiring steadying assist when standing to pull up pants. Discussed pt cervical precautions, pt requiring min v/c to remember no lifting precaution.  Pt seated in w/c with all needs nearby and NT entering to set-up lunch when leaving.  OT Evaluation Precautions/Restrictions  Precautions Precautions: Fall;Cervical Precaution Comments: pt blind right eye and only 28% vision left eye Required Braces or Orthoses: Cervical Brace Cervical Brace: Hard collar;At all times Restrictions Weight Bearing  Restrictions: No   General Chart Reviewed: Yes Family/Caregiver Present: Yes    Pain Pain Assessment Pain Assessment: No/denies pain Pain Score: 0-No pain   Home Living/Prior Functioning Home Living Family/patient expects to be discharged to:: Private residence Living Arrangements: Spouse/significant other Available Help at Discharge: Family, Available 24 hours/day Type of Home: Apartment Home Access: Level entry, Ramped entrance Home Layout: One level Additional Comments: Pt has hospital bed and RW at home but slept in chair in living room PTA  Lives With: Spouse IADL History Homemaking Responsibilities: Yes Meal Prep Responsibility: Secondary Laundry Responsibility: Secondary Cleaning Responsibility: Secondary Bill Paying/Finance Responsibility: No Shopping Responsibility: No Current License: No Mode of Transportation: Car Education: [employer] 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[employer]; Retail buyer) Leisure and Hobbies: Playing with grandchildren, yard work Prior Function Level of Independence: Requires assistive device for independence (used cane to get in tub)  Able to Take Stairs?: No Driving: No Vocation: On disability Leisure: Hobbies-yes (Comment)   ADL See FIM  Vision/Perception  Vision- History Baseline Vision/History: Legally blind (R eye, 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) Patient Visual Report: No change from baseline   Cognition Overall Cognitive Status: No family/caregiver present to determine baseline cognitive functioning Arousal/Alertness: Awake/alert Orientation Level: Oriented to place;Oriented to situation;Oriented to person Safety/Judgment: Impaired   Sensation Sensation Light Touch: Appears Intact Coordination Gross Motor Movements are Fluid and Coordinated: Yes Fine Motor Movements are Fluid and Coordinated: No Finger Nose Finger Test: Impaired d/t vision   Motor  Motor Motor: Within Functional Limits    Mobility  Transfers Transfers: Sit to Stand;Stand to Sit Sit to Stand: 4: Min assist Sit to Stand Details: Tactile cues for weight shifting;Verbal cues for safe use of DME/AE Stand to Sit: 4: Min assist Stand to Sit Details (indicate cue type and reason): Verbal cues for safe use of DME/AE   Trunk/Postural Assessment  See PT note  Balance See PT note  Extremity/Trunk Assessment RUE Assessment  RUE Assessment: Within Functional Limits LUE Assessment LUE Assessment: Within Functional Limits  FIM:  FIM - Grooming Grooming Steps: Wash, rinse, dry face;Wash, rinse, dry hands Grooming: 5: Set-up assist to obtain items FIM - Bathing Bathing Steps Patient Completed: Chest;Right Arm;Left Arm;Abdomen;Front perineal area;Buttocks;Right upper leg;Left upper leg;Right lower leg (including foot);Left lower leg (including foot) Bathing: 4: Steadying assist FIM - Upper Body Dressing/Undressing Upper body dressing/undressing steps patient completed: Thread/unthread right sleeve of pullover shirt/dresss;Pull shirt over trunk;Put head through opening of pull over shirt/dress Upper body dressing/undressing: 4: Min-Patient completed 75 plus % of tasks FIM - Lower Body Dressing/Undressing Lower body dressing/undressing steps patient completed: Thread/unthread left pants leg;Thread/unthread right pants leg;Pull pants up/down;Thread/unthread right underwear leg;Pull underwear up/down;Thread/unthread left underwear leg Lower body dressing/undressing: 3: Mod-Patient completed 50-74% of tasks FIM - Toileting Toileting steps completed by patient: Adjust clothing prior to toileting;Adjust clothing after toileting;Performs perineal hygiene Toileting: 4: Steadying assist FIM - Bed/Chair Transfer Bed/Chair Transfer: 0: Activity did not occur FIM - Radio producer Devices: Nurse, learning disability Transfers: 4-To toilet/BSC: Min A (steadying Pt. > 75%);4-From toilet/BSC:  Min A (steadying Pt. > 75%) FIM - Tub/Shower Transfers Tub/shower Transfers: 0-Activity did not occur or was simulated   Refer to Care Plan for Long Term Goals  Recommendations for other services: None  Discharge Criteria: Patient will be discharged from OT if patient refuses treatment 3 consecutive times without medical reason, if treatment goals not met, if there is a change in medical status, if patient makes no progress towards goals or if patient is discharged from hospital.  The above assessment, treatment plan, treatment alternatives and goals were discussed and mutually agreed upon: by patient  [name] 11/21/2014, 2:48 PM

## 2014-11-21 NOTE — Progress Notes (Signed)
[facility] Admission Coordinator Signed Physical Medicine and Rehabilitation PMR Pre-admission 11/19/2014 3:27 PM  Related encounter: Admission (Discharged) from 11/13/2014 in [place]   PMR Admission Coordinator Pre-Admission Assessment  Patient: Luke Cada. 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 Date: A & B: 09-20-05 Deduct: $1260 Out of Pocket Max: none Life Max: unlimited CIR: 100% SNF: 100% days 1-20; 80% days 21-100 (100 day visit max) Outpatient: 80% Co-Pay: 20% Home Health: 100% Co-Pay: none, no visit limits DME: 80% Co-Pay: 20% Providers: pt's preference  SECONDARY: Medicaid of [place] Policy#: [id number] o Subscriber: self Benefits: Phone #: [phone]  Eff. Date: eligible as of 11-19-14   Emergency Contact Information Contact Information    Name Relation Home Work [place] Spouse [phone]     Aster, Eckrich [phone]    Penny Pia Daughter [phone]       Current Medical History  Patient Admitting Diagnosis: cervical spondylosis with myelopathy s/p decompression  History of Present Illness: Luke ORSAK Sr. is a 68 y.o. male with history of HTN, DM type 2, NASH, COPD, who has had problems gait disorder with numbness BLE and BUE with difficulty walking since June. Work up with significant cervical spondylitic changes with large osteophytes C3-C7 with cord compression but sugery few months ago was  cancelled due to anemia with significant thrombocytopenia. He was evaluated by hematology and was recently cleared for surgery with recommendations to follow platelets. He was admitted on 11/13/14 for ACDF C3-C7 by Dr. Kathyrn Sheriff. Extubated on 11/25 and PCCM following for input. NPO recommended initially by ST due to overt signs of aspiration and suspicion of post op edema. Therapy ongoing in addition to dysphagia treatment and he was started on dysphagia 1 thin liquids. Neck and shoulder pain improving and respiratory status stable. CIR recommended by rehab team and MD for follow up therapy.    Past Medical History  Past Medical History  Diagnosis Date  . Stroke   . Hypertension   . COPD (chronic obstructive pulmonary disease)   . Diabetes mellitus without complication   . H/O hiatal hernia   . Depression   . Arthritis   . Hepatitis     NASH  . Cirrhosis of liver not due to alcohol     Family History  family history is not on file.  Prior Rehab/Hospitalizations: pt had recent previous home health nursing and home health PT.  Current Medications  Current facility-administered medications: albuterol (PROVENTIL) (2.5 MG/3ML) 0.083% nebulizer solution 2.5 mg, 2.5 mg, Nebulization, Q2H PRN, Corey Harold, NP; allopurinol (ZYLOPRIM) tablet 100 mg, 100 mg, Oral, BID, Consuella Lose, MD, 100 mg at 11/20/14 0959; antiseptic oral rinse (CPC / CETYLPYRIDINIUM CHLORIDE 0.05%) solution 7 mL, 7 mL, Mouth Rinse, QID, Corey Harold, NP, 7 mL at 11/20/14 1153 brimonidine (ALPHAGAN) 0.2 % ophthalmic solution 1 drop, 1 drop, Left Eye, BID, Consuella Lose, MD, 1 drop at 11/20/14 1000; cholecalciferol (VITAMIN D) tablet 1,000 Units, 1,000 Units, Oral, Daily, Consuella Lose, MD, 1,000 Units at 11/20/14 0958; cholestyramine (QUESTRAN) packet 4 g, 4 g, Oral, TID, Consuella Lose, MD, 4 g at 11/20/14 0957; diazepam (VALIUM) tablet 5 mg, 5 mg, Oral, Q6H PRN,  Consuella Lose, MD docusate  sodium (COLACE) capsule 100 mg, 100 mg, Oral, BID, Consuella Lose, MD, 100 mg at 11/20/14 5462; ferrous sulfate tablet 325 mg, 325 mg, Oral, TID WC, Consuella Lose, MD, 325 mg at 11/20/14 1153; insulin aspart (novoLOG) injection 0-20 Units, 0-20 Units, Subcutaneous, TID WC, Consuella Lose, MD, 20 Units at 11/20/14 1152 lactulose (CHRONULAC) 10 GM/15ML solution 20 g, 20 g, Per Tube, BID, Raylene Miyamoto, MD, 20 g at 11/20/14 1010; lactulose (CHRONULAC) 10 GM/15ML solution 30 g, 30 g, Oral, BID PRN, Consuella Lose, MD; menthol-cetylpyridinium (CEPACOL) lozenge 3 mg, 1 lozenge, Oral, PRN **OR** phenol (CHLORASEPTIC) mouth spray 1 spray, 1 spray, Mouth/Throat, PRN, Consuella Lose, MD methocarbamol (ROBAXIN) 500 mg in dextrose 5 % 50 mL IVPB, 500 mg, Intravenous, Q6H PRN, Eustace Moore, MD; ondansetron [facility]) injection 4 mg, 4 mg, Intravenous, Q4H PRN, Consuella Lose, MD, 4 mg at 11/17/14 0259; oxyCODONE (Oxy IR/ROXICODONE) immediate release tablet 10 mg, 10 mg, Oral, Q4H PRN, Eustace Moore, MD, 10 mg at 11/20/14 0401 pantoprazole (PROTONIX) EC tablet 40 mg, 40 mg, Oral, BID, Raylene Miyamoto, MD, 40 mg at 11/20/14 7035; rifaximin Doreene Nest) tablet 550 mg, 550 mg, Oral, BID, Consuella Lose, MD, 550 mg at 11/20/14 0093; senna (SENOKOT) tablet 8.6 mg, 1 tablet, Oral, BID, Consuella Lose, MD, 8.6 mg at 11/20/14 8182; sodium chloride 0.9 % injection 3 mL, 3 mL, Intravenous, Q12H, Consuella Lose, MD, 3 mL at 11/18/14 2220 sodium chloride 0.9 % injection 3 mL, 3 mL, Intravenous, PRN, Consuella Lose, MD; timolol (TIMOPTIC) 0.5 % ophthalmic solution 1 drop, 1 drop, Left Eye, BID, Consuella Lose, MD, 1 drop at 11/20/14 1000  Patients Current Diet: DIET - DYS 1, thin liquids, meds crushed in puree, full supervision  Precautions / Restrictions Precautions Precautions: Fall, Cervical Precaution Comments: pt blind right eye and only 28% vision  left eye Cervical Brace: Hard collar, At all times Restrictions Weight Bearing Restrictions: No   Prior Activity Level Community (5-7x/wk): Pt got out everyday and walked everywhere (he does not drive due to visual limitations). Pt states he walks to nearby stores that are 15 minutes from home. Pt was using a walker and progressed to a cane. He and his wife get out to church.   Home Assistive Devices / Equipment Home Assistive Devices/Equipment: Cane (specify quad or straight), Walker (specify type), Shower chair with back, Raised toilet seat with rails, Dentures (specify type) (therapist, nurse) Home Equipment: Walker - 2 wheels, Cane - single point  Prior Functional Level Prior Function Level of Independence: Independent with assistive device(s)  Current Functional Level Cognition  Overall Cognitive Status: Impaired/Different from baseline Orientation Level: Oriented to person, Oriented to place, Disoriented to time, Disoriented to situation Safety/Judgement: Decreased awareness of safety   Extremity Assessment (includes Sensation/Coordination)          ADLs       Mobility  Overal bed mobility: Needs Assistance Bed Mobility: Rolling, Sidelying to Sit Rolling: Mod assist Sidelying to sit: Mod assist Supine to sit: Min assist Sit to supine: Min assist General bed mobility comments: Min A to steady patient up into sitting and to ensure balance. Cues for positioning    Transfers  Overall transfer level: Needs assistance Equipment used: Rolling walker (2 wheeled) Transfers: Sit to/from Stand Sit to Stand: Min assist General transfer comment: Min A to ensure balance with stand. Patient with initial posterior lean but with cues able to correct. Patient needed cues for safe hand placement and not to pull up from RW  Ambulation / Gait / Stairs / Wheelchair Mobility  Ambulation/Gait Ambulation/Gait assistance: Museum/gallery curator (Feet): 150  Feet Assistive device: Rolling walker (2 wheeled) Gait Pattern/deviations: Step-through pattern, Decreased stride length Gait velocity: decreased Gait velocity interpretation: Below normal speed for age/gender General Gait Details: cues for upright posture and for RW safety. patient needed assistance to [employer] and avoid obstacles    Posture / Balance Dynamic Sitting Balance Sitting balance - Comments: anterior bias    Special needs/care consideration BiPAP/CPAP no CPM no  Continuous Drip IV no  Dialysis no  Life Vest no  Oxygen no  Special Bed no Trach Size no  Wound Vac (area) no  Skin - bruising noted on anterior neck and some varied bruises on forearms  Bowel mgmt: last BM on 11-19-14 Bladder mgmt: using urinal Diabetic mgmt - yes, uses insulin at home  Note: pt is blind in R eye and has 28% vision in L eye   Previous Home Environment Living Arrangements: Spouse/significant other Available Help at Discharge: Family, Available 24 hours/day Type of Home: Apartment Home Layout: One level Home Access: Level entry Home Care Services: Yes Type of Home Care Services: Home RN (PT) Home Care Agency (if known): ([facility] home [facility]) Additional Comments: Need to confirm home set-up as pt with some cognitive deficits.   Discharge Living Setting Plans for Discharge Living Setting: Patient's home, Apartment Type of Home at Discharge: Apartment Discharge Home Layout: One level Discharge Home Access: Level entry Does the patient have any problems obtaining your medications?: Yes (Describe) (possible transportation issues as dtr shared their car recently broke down)  Social/Family/Support Systems Patient Roles: Spouse, Parent Contact Information: dtr Penny Pia is primary contact as wife is deaf. Wife is able to read lips and can communicate well in person. Anticipated Caregiver: wife and dtr (note that goals are for Mod  Ind) Anticipated Caregiver's Contact Information: see above, noting that pt's wife is deaf and reads lips. Ability/Limitations of Caregiver: wife/dtr avail. to help. Wife is deaf. Caregiver Availability: 24/7 Discharge Plan Discussed with Primary Caregiver: Yes Is Caregiver In Agreement with Plan?: Yes Does Caregiver/Family have Issues with Lodging/Transportation while Pt is in Rehab?: No  Goals/Additional Needs Patient/Family Goal for Rehab: Mod ind with PT, OT and SLP Expected length of stay: 7 days Cultural Considerations: pt and his wife attend [employer] Dietary Needs: Dys 1, thin liquids, meds crushed in puree, full supervision Equipment Needs: to be determined Pt/Family Agrees to Admission and willing to participate: Yes (spoke with pt's dtr/wife by phone on 11-30. I met with pt's wife on 11-20-14 as well.) Program Orientation Provided & Reviewed with Pt/Caregiver Including Roles & Responsibilities: Yes   Decrease burden of Care through IP rehab admission: NA  Possible need for SNF placement upon discharge: not anticipated  Patient Condition: This patient's condition remains as documented in the consult dated 11-19-14, in which the Rehabilitation Physician determined and documented that the patient's condition is appropriate for intensive rehabilitative care in an inpatient rehabilitation facility. Will admit to inpatient rehab today.  Preadmission Screen Completed By: Nanetta Batty, PT, 11/20/2014 2:47 PM ______________________________________________________________________  Discussed status with Dr. Nicole Kindred on 11-20-14 at 1447 and received telephone approval for admission today.  Admission Coordinator: Nanetta Batty, PT, time 1447/Date 11-20-14          Cosigned by: Meredith Staggers, MD at 11/20/2014 3:32 PM  Revision History     Date/Time User Provider Type Action   11/20/2014 3:32 PM Celesta Gentile  Naaman Plummer, MD Physician Cosign   11/20/2014 2:49  PM [facility] Admission Coordinator Sign

## 2014-11-21 NOTE — Progress Notes (Signed)
[place] PHYSICAL MEDICINE & REHABILITATION     PROGRESS NOTE    Subjective/Complaints: Had a reasonable night. Pain controlled. Able to sleep.   Objective: Vital Signs: Blood pressure 141/65, pulse 81, temperature 98 F (36.7 C), temperature source Oral, resp. rate 18, weight 100.3 kg (221 lb 1.9 oz), SpO2 97 %. No results found. No results for input(s): WBC, HGB, HCT, PLT in the last 72 hours. No results for input(s): NA, K, CL, GLUCOSE, BUN, CREATININE, CALCIUM in the last 72 hours.  Invalid input(s): CO CBG (last 3)   Recent Labs  11/20/14 1656 11/20/14 2057 11/21/14 0654  GLUCAP 189* 345* 237*    Wt Readings from Last 3 Encounters:  11/21/14 100.3 kg (221 lb 1.9 oz)  11/13/14 91.5 kg (201 lb 11.5 oz)  10/02/14 88.814 kg (195 lb 12.8 oz)    Physical Exam:   Constitutional: He is oriented to person, place, and time. He appears well-developed and well-nourished. Verbose HENT:  Head: Normocephalic and atraumatic.  Eyes: Conjunctivae are normal. Pupils are equal, round, and reactive to light.  Neck:  Immobilized in collar. Incision clean, dry and intact. Persistent bruising and edema about neck/op site Cardiovascular: Normal rate and regular rhythm.  No murmur heard. Respiratory: Effort normal and breath sounds normal.  GI: Distended and tympanic (ascites?). Diastasis recti evident. Bowel sounds are normal. Abdomen non-tender Musculoskeletal: He exhibits no edema or tenderness.  Neurological: He is alert and oriented to person, place, and time.  Mild dysarthria as edentulous. Able to follow commands without difficulty. Sensory deficits BLE and bilateral upper exts and trunk---1+/2. UES grossly 5/5 proximal to distal. LE's 4/5 HF, KE to 5/5 distally at ankles. He is a bit impulsive and safety judgement impaired. Impaired balance.  Skin: Skin is warm and dry. Ecchymotic areas on BUE. BLE with excoriated areas.  Psychiatric: He has a normal mood and affect.  His behavior is normal Assessment/Plan: 1. 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 Physiatrist is providing close team supervision and 24 hour management of active medical problems listed below. Physiatrist and rehab team continue to assess barriers to discharge/monitor patient progress toward functional and medical goals. FIM:                   Comprehension Comprehension Mode: Auditory Comprehension: 3-Understands basic 50 - 74% of the time/requires cueing 25 - 50%  of the time  Expression Expression Mode: Verbal Expression: 3-Expresses basic 50 - 74% of the time/requires cueing 25 - 50% of the time. Needs to repeat parts of sentences.  Social Interaction Social Interaction: 2-Interacts appropriately 25 - 49% of time - Needs frequent redirection.  Problem Solving Problem Solving: 1-Solves basic less than 25% of the time - needs direction nearly all the time or does not effectively solve problems and may need a restraint for safety  Memory Memory: 2-Recognizes or recalls 25 - 49% of the time/requires cueing 51 - 75% of the time Medical Problem List and Plan: 1. Functional deficits secondary to Cervical spondylosis with myelopathy s/p decompressive lam.  2. DVT Prophylaxis/Anticoagulation: Mechanical: Sequential compression devices, below knee Bilateral lower extremities 3. Pain Management: prn oxycodone effective.  4. Mood: Will continue zoloft daily. LCSW to follow for evaluation and support.  5. Neuropsych: This patient is capable of making decisions on his own behalf. 6. Skin/Wound Care: Routine pressure relief measures.  7. Fluids/Electrolytes/Nutrition: Monitor  I/O.  8. DM type 2 with retinopathy: sugars uncontrolled presently. Resumed lantus at 40 units daily ( will titrate to home dose as needed)  with SSI as needed for elevated BS.  9. NASH: Continue lactulose and Xifaxan bid. Check ammonia levels in am as has missed a couple of doses and wife reporting some confusion. On questran to help with diarrhea.  10. COPD: Respiratory status stable.  11. HTN: will monitor tid. Continue Corgard, Lasix and Norvasc.  12. Glaucoma: Continue Timoptic and Alphagan bid.  13. Thrombocytopenia: Recheck levels today-baseline in 50's  14. Chronic anemia: labs pending today   LOS (Days) 1 A FACE TO FACE EVALUATION WAS PERFORMED  SWARTZ,ZACHARY T 11/21/2014 7:42 AM

## 2014-11-21 NOTE — Evaluation (Signed)
Physical Therapy Assessment and Plan  Patient Details  Name: [name] MRN: [id number] Date of Birth: [birth year]  PT Diagnosis: Abnormality of gait, Cognitive deficits, Coordination disorder and Difficulty walking Rehab Potential: Good ELOS: 5-7 days   Today's Date: 11/21/2014 PT Individual Time: 1000-1100 PT Individual Time Calculation (min): 60 min    Problem List:  Patient Active Problem List   Diagnosis Date Noted  . NASH (nonalcoholic steatohepatitis) 11/21/2014  . Stenosis of cervical spine region 11/20/2014  . Type 2 diabetes mellitus, uncontrolled, with retinopathy 11/20/2014  . History of COPD 11/20/2014  . Diabetes mellitus with diabetic retinopathy   . Acute respiratory acidosis   . Encounter for intubation   . Encounter for orogastric (OG) tube placement   . Surgery, other elective   . Cervical spondylosis with myelopathy 11/13/2014    Past Medical History:  Past Medical History  Diagnosis Date  . Stroke   . Hypertension   . COPD (chronic obstructive pulmonary disease)   . Diabetes mellitus without complication   . H/O hiatal hernia   . Depression   . Arthritis   . Hepatitis     NASH  . Cirrhosis of liver not due to alcohol   . Obstructive sleep apnea 11/20/2014   Past Surgical History:  Past Surgical History  Procedure Laterality Date  . Nose surgery    . Appendectomy    . Cardiac catheterization    . Eye surgery      leaglly blind  . Anterior cervical decompression/discectomy fusion 4 levels N/A 11/13/2014    Procedure:  Cervical three-four, Cervical four-five, Cervical five-six, Cervical six- seven anterior cervical decompression with fusion and interbody prosthesis plating and bonegraft;  Surgeon: Consuella Lose, MD;  Location: [facility];  Service: Neurosurgery;  Laterality: N/A;    Assessment & Plan Clinical Impression: Luke FRAZZINI Sr. is a 68 y.o. male with history of HTN, DM type 2, NASH, COPD, who has had problems gait  disorder with numbness BLE and BUE with difficulty walking since June. Work up with significant cervical spondylitic changes with large osteophytes C3-C7 with cord compression but sugery few months ago was cancelled due to anemia with significant thrombocytopenia.  He was evaluated by hematology and was recently cleared for surgery with recommendations to follow platelets.  He was admitted on 11/13/14 for ACDF C3-C7 by Dr. Kathyrn Sheriff. Extubated on 11/25 and PCCM following for input. NPO recommended initially by ST due to overt signs of aspiration and suspicion of post op edema. Therapy ongoing in addition to dysphagia treatment and he was started on dysphagia 1 thin liquids. Neck and shoulder pain improving and respiratory status stable. CIR recommended by rehab team and MD for follow up therapy. Patient transferred to CIR on 11/20/2014 .   Patient currently requires min with mobility secondary to decreased visual acuity, decreased safety awareness and decreased memory and decreased sitting balance, decreased standing balance and difficulty maintaining precautions.  Prior to hospitalization, patient was modified independent  with mobility and lived with Spouse in a [place] home.  Home access is  Level entry, Ramped entrance.  Patient will benefit from skilled PT intervention to maximize safe functional mobility, minimize fall risk and decrease caregiver burden for planned discharge home with 24 hour supervision.  Anticipate patient will benefit from follow up [place] at discharge.  PT - End of Session Activity Tolerance: Tolerates 30+ min activity with multiple rests Endurance Deficit: Yes PT Assessment Rehab Potential (ACUTE/IP ONLY): Good Barriers to Discharge: Decreased  caregiver support PT Patient demonstrates impairments in the following area(s): Balance;Safety;Pain;Perception;Motor PT Transfers Functional Problem(s): Bed Mobility;Bed to Chair;Car;Furniture;Floor PT Locomotion Functional Problem(s):  Ambulation;Wheelchair Mobility PT Plan PT Intensity: Minimum of 1-2 x/day ,45 to 90 minutes PT Frequency: 5 out of 7 days PT Duration Estimated Length of Stay: 5-7 days PT Treatment/Interventions: Ambulation/gait training;Balance/vestibular training;Discharge planning;DME/adaptive equipment instruction;Functional mobility training;Neuromuscular re-education;Patient/family education;Therapeutic Exercise;Therapeutic Activities;UE/LE Strength taining/ROM;UE/LE Coordination activities;Visual/perceptual remediation/compensation PT Transfers Anticipated Outcome(s): Supervision PT Locomotion Anticipated Outcome(s): Supervision PT Recommendation Recommendations for Other Services: Speech consult (for cognitive evaluation) Follow Up Recommendations: Home health PT Patient destination: Home Equipment Details: Pt owns RW prior to admission, do not anticipate need for w/c.  Skilled Therapeutic Intervention Session focused on functional evaluation, orientation to rehab unit, safety and falls risk in room. Pt overall MinA, though ModA initially with ambulation progressing to Min with further practice. Pt primarily limited by visual deficits (blind in R eye, 28% vision in L eye), decreased standing balance, decreased awareness of precautions, impaired memory. Per pt's wife and daughter pt's current cognitive level is different than baseline, though his daughter mentioned that he has sometimes had bouts of confusion that came and went prior to admission.   PT Evaluation Precautions/Restrictions Precautions Precautions: Fall;Cervical Precaution Comments: pt blind right eye and only 28% vision left eye Required Braces or Orthoses: Cervical Brace Cervical Brace: Hard collar;At all times Restrictions Weight Bearing Restrictions: No General   Vital Signs Pain   Home Living/Prior Functioning Home Living Available Help at Discharge: Family;Available 24 hours/day Type of Home: Apartment Home Access: Level  entry;Ramped entrance Home Layout: One level Additional Comments: Pt has hospital bed and RW at home but slept in chair in living room PTA  Lives With: Spouse Prior Function Level of Independence: Requires assistive device for independence (Used cane for ambulation PTA) Vocation: On disability Vision/Perception    Pt legally blind in R eye, 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 Vision is currently at baseline. Cognition Overall Cognitive Status: Impaired/Different from baseline Arousal/Alertness: Awake/alert Orientation Level: Oriented to person;Oriented to place;Oriented to situation;Disoriented to time (Oriented to month, but not year. ) Memory: Impaired Memory Impairment: Decreased recall of new information Safety/Judgment: Impaired Sensation Sensation Light Touch: Appears Intact Coordination Gross Motor Movements are Fluid and Coordinated: Yes Finger Nose Finger Test: Impaired d/t vision Motor  Motor Motor: Within Functional Limits  Mobility Transfers Transfers: Yes Sit to Stand: 4: Min assist Sit to Stand Details: Manual facilitation for weight shifting;Verbal cues for precautions/safety Stand to Sit: 4: Min assist Stand to Sit Details (indicate cue type and reason): Manual facilitation for weight shifting Locomotion  Ambulation Ambulation: Yes Ambulation/Gait Assistance: 4: Min assist;3: Mod assist Ambulation Distance (Feet): 200 Feet Assistive device: Rolling walker Ambulation/Gait Assistance Details: Verbal cues for precautions/safety;Manual facilitation for weight shifting;Tactile cues for posture Gait Gait velocity: decreased Stairs / Additional Locomotion Stairs: No Wheelchair Mobility Wheelchair Mobility: No  Trunk/Postural Assessment  Cervical Assessment Cervical Assessment:  (NT due to cervical precautions) Thoracic Assessment Thoracic Assessment: Within Functional Limits Lumbar Assessment Lumbar Assessment: Within Functional Limits Postural Control Postural  Control: Within Functional Limits  Balance Balance Balance Assessed: Yes Static Sitting Balance Static Sitting - Balance Support: Feet supported (Intermittent UE support) Static Sitting - Level of Assistance: 5: Stand by assistance Dynamic Sitting Balance Dynamic Sitting - Balance Support: Feet supported;During functional activity Dynamic Sitting - Level of Assistance: 4: Min assist Dynamic Sitting - Balance Activities: Forward lean/weight shifting Static Standing Balance Static Standing - Balance Support: Bilateral  upper extremity supported;During functional activity Static Standing - Level of Assistance: 4: Min assist Dynamic Standing Balance Dynamic Standing - Balance Support: Bilateral upper extremity supported;During functional activity Dynamic Standing - Level of Assistance: 4: Min assist;3: Mod assist Dynamic Standing - Balance Activities: Forward lean/weight shifting;Lateral lean/weight shifting Extremity Assessment      RLE Assessment RLE Assessment: Within Functional Limits [facility] for strength and ROM) LLE Assessment LLE Assessment: Within Functional Limits (WFL for strength and ROM)  FIM:  FIM - Bed/Chair Transfer Bed/Chair Transfer: 0: Activity did not occur FIM - Locomotion: Ambulation Ambulation/Gait Assistance: 4: Min assist;3: Mod assist   Refer to Care Plan for Long Term Goals  Recommendations for other services: Other: Cognitive evaluation from SLP  Discharge Criteria: Patient will be discharged from PT if patient refuses treatment 3 consecutive times without medical reason, if treatment goals not met, if there is a change in medical status, if patient makes no progress towards goals or if patient is discharged from hospital.  The above assessment, treatment plan, treatment alternatives and goals were discussed and mutually agreed upon: by patient and by family  Rada Hay 11/21/2014, 3:39 PM

## 2014-11-21 NOTE — Progress Notes (Signed)
Patient information reviewed and entered into eRehab system by Nesha Counihan, RN, CRRN, PPS Coordinator.  Information including medical coding and functional independence measure will be reviewed and updated through discharge.     Per nursing patient was given "Data Collection Information Summary for Patients in Inpatient Rehabilitation Facilities with attached "Privacy Act Statement-Health Care Records" upon admission.  

## 2014-11-21 NOTE — Progress Notes (Signed)
Social Work Assessment and Plan  Patient Details  Name: [name] MRN: [id number] Date of Birth: 04/18/1946  Today's Date: 11/21/2014  Problem List:  Patient Active Problem List   Diagnosis Date Noted  . NASH (nonalcoholic steatohepatitis) 11/21/2014  . Stenosis of cervical spine region 11/20/2014  . Type 2 diabetes mellitus, uncontrolled, with retinopathy 11/20/2014  . History of COPD 11/20/2014  . Diabetes mellitus with diabetic retinopathy   . Acute respiratory acidosis   . Encounter for intubation   . Encounter for orogastric (OG) tube placement   . Surgery, other elective   . Cervical spondylosis with myelopathy 11/13/2014   Past Medical History:  Past Medical History  Diagnosis Date  . Stroke   . Hypertension   . COPD (chronic obstructive pulmonary disease)   . Diabetes mellitus without complication   . H/O hiatal hernia   . Depression   . Arthritis   . Hepatitis     NASH  . Cirrhosis of liver not due to alcohol   . Obstructive sleep apnea 11/20/2014   Past Surgical History:  Past Surgical History  Procedure Laterality Date  . Nose surgery    . Appendectomy    . Cardiac catheterization    . Eye surgery      leaglly blind  . Anterior cervical decompression/discectomy fusion 4 levels N/A 11/13/2014    Procedure:  Cervical three-four, Cervical four-five, Cervical five-six, Cervical six- seven anterior cervical decompression with fusion and interbody prosthesis plating and bonegraft;  Surgeon: Neelesh Nundkumar, MD;  Location: [facility];  Service: Neurosurgery;  Laterality: N/A;   Social History:  reports that he has never smoked. He does not have any smokeless tobacco history on file. He reports that he does not drink alcohol or use illicit drugs.  Family / Support Systems Marital Status: Married How Long?: 46 years Patient Roles: Spouse, Parent, Other (Comment) (brother, grandfather) Spouse/Significant Other: [name] - wife - [phone] Children:  [name] - son - [phone]/ [phone]             [name] - dtr - [phone] Other Supports: brother Anticipated Caregiver: wife and dtr Ability/Limitations of Caregiver: wife/dtr avail. to help. Wife is deaf. Caregiver Availability: 24/7 Family Dynamics: close, supportive family per pt  Social History Preferred language: English Religion: [employer] Cultural Background: attends [employer] Education: graduated high school Read: Yes Write: Yes Employment Status: Disabled Date Retired/Disabled/Unemployed: 2003 Age Retired: 56 Legal History/Current Legal Issues: None reported Guardian/Conservator: N/A   Abuse/Neglect Physical Abuse: Denies Verbal Abuse: Denies Sexual Abuse: Denies Exploitation of patient/patient's resources: Denies Self-Neglect: Denies  Emotional Status Pt's affect, behavior and adjustment status: Pt was cheerful and postive with CSW and reports feeling well emotinally.  Pt is motivated to get well and stronger. Recent Psychosocial Issues: None reported Psychiatric History: Pt has a history of depression listed in his chart.  When CSW asked him about this, pt reported he has experienced depression in the past, but is feeling well emotionally currently. Substance Abuse History: None reported  Patient / Family Perceptions, Expectations & Goals Pt/Family understanding of illness & functional limitations: Pt reports an understanding of his condtion.  He is aware of areas in which he needs to approve. Premorbid pt/family roles/activities: Pt likes to attend his church with his wife.  He also enjoys walking to nearby stores to get out of the home almost daily.  Pt/wife share household chores. Anticipated changes in roles/activities/participation: Pt would like to be able to resume   the above activities as soon as he is well enough and safe to do so. Pt/family expectations/goals: Pt wants to recover so that he can be a good husband, father, grandfather, and  brother.  Community Resources Community Agencies: None Premorbid Home Care/DME Agencies: Other (Comment) (has used [facility] in the past) Transportation available at discharge: wife/family  Discharge Planning Living Arrangements: Spouse/significant other Support Systems: Spouse/significant other, Children, Other relatives, Friends/neighbors, Church/faith community Type of Residence: Private residence (duplex with hand rails in bathroom) Insurance Resources: Medicare, Medicaid (specify county) ([place]) Financial Resources: Social Security Financial Screen Referred: No Money Management: Patient, Spouse Does the patient have any problems obtaining your medications?: No Home Management: Pt and wife share household chores.  Pt reports his wife can handle things until he is well enough to help her.  Pt's son and dtr are also available and supportive. Patient/Family Preliminary Plans: Pt plans to return to his home with his wife.  Dtr is available as needed, too. Social Work Anticipated Follow Up Needs: HH/OP Expected length of stay: 7 days  Clinical Impression CSW met with pt to introduce self and role of CSW to pt, as well as complete CSW assessment.  Pt's wife was not present at the time of visit, so CSW left cards and will meet her when she comes to see pt, as she is deaf and meeting her in person would be better for communication.  Pt states he has a lot of support and plans to go home with HH and family assistance.  Pt has a cane and walker at home in addition to grab bars in his bathroom.  He lives in a duplex with level entrance.  Pt reports transitioning fine to CIR and he is appreciative to have the opportunity to be here to get stronger so he can be a good husband, father, grandfather, brother.  Pt has a supportive family and a church he is connected to.  No current needs noted, but CSW will continue to follow and will meet pt's wife and assist as needed.  [name], Jennifer  Capps 11/21/2014, 11:45 AM    

## 2014-11-21 NOTE — Progress Notes (Signed)
Inpatient Diabetes Program Recommendations  AACE/ADA: New Consensus Statement on Inpatient Glycemic Control (2013)  Target Ranges:  Prepandial:   less than 140 mg/dL      Peak postprandial:   less than 180 mg/dL (1-2 hours)      Critically ill patients:  140 - 180 mg/dL   Results for MARTEZ, WEIAND (MRN [id number]) as of 11/21/2014 11:34  Ref. Range 11/20/2014 06:31 11/20/2014 11:43 11/20/2014 16:56 11/20/2014 20:57 11/21/2014 06:54  Glucose-Capillary Latest Range: 70-99 mg/dL 242 (H) 357 (H) 189 (H) 345 (H) 237 (H)   Diabetes history: DM2 Outpatient Diabetes medications: Latnus 80 units QHS, Novolog sliding scale TID with meals Current orders for Inpatient glycemic control: Lantus 40 units QHS, Novolog 0-20 units AC  Inpatient Diabetes Program Recommendations Insulin - Basal: Please consider increasing Lantus to 45 units QHS.  Thanks, Barnie Alderman, RN, MSN, CCRN, CDE Diabetes Coordinator Inpatient Diabetes Program [phone] (Team Pager) [phone] ([facility] office) [phone] [facility] office)

## 2014-11-22 ENCOUNTER — Inpatient Hospital Stay (HOSPITAL_COMMUNITY): Payer: Medicare Other

## 2014-11-22 ENCOUNTER — Inpatient Hospital Stay (HOSPITAL_COMMUNITY): Payer: Medicare Other | Admitting: Speech Pathology

## 2014-11-22 ENCOUNTER — Encounter (HOSPITAL_COMMUNITY): Payer: Medicare Other | Admitting: Occupational Therapy

## 2014-11-22 DIAGNOSIS — K7581 Nonalcoholic steatohepatitis (NASH): Secondary | ICD-10-CM

## 2014-11-22 LAB — GLUCOSE, CAPILLARY
GLUCOSE-CAPILLARY: 337 mg/dL — AB (ref 70–99)
Glucose-Capillary: 248 mg/dL — ABNORMAL HIGH (ref 70–99)
Glucose-Capillary: 227 mg/dL — ABNORMAL HIGH (ref 70–99)
Glucose-Capillary: 269 mg/dL — ABNORMAL HIGH (ref 70–99)

## 2014-11-22 MED ORDER — INSULIN GLARGINE 100 UNIT/ML ~~LOC~~ SOLN
60.0000 [IU] | Freq: Every day | SUBCUTANEOUS | Status: DC
  Administered 2014-11-22: 60 [IU] via SUBCUTANEOUS
  Filled 2014-11-22 (×2): qty 0.6

## 2014-11-22 MED ORDER — INSULIN NPH (HUMAN) (ISOPHANE) 100 UNIT/ML ~~LOC~~ SUSP
10.0000 [IU] | SUBCUTANEOUS | Status: AC
  Administered 2014-11-22: 10 [IU] via SUBCUTANEOUS
  Filled 2014-11-22: qty 10

## 2014-11-22 NOTE — Progress Notes (Addendum)
[place] PHYSICAL MEDICINE & REHABILITATION     PROGRESS NOTE    Subjective/Complaints: Had a fair night. Denies pain. Occasional cough. Therapy went "ok"  Objective: Vital Signs: Blood pressure 129/58, pulse 88, temperature 98.2 F (36.8 C), temperature source Oral, resp. rate 20, height 5' (1.524 m), weight 100.3 kg (221 lb 1.9 oz), SpO2 98 %. No results found.  Recent Labs  11/21/14 0706  WBC 5.0  HGB 10.4*  HCT 31.2*  PLT 50*    Recent Labs  11/21/14 0706  NA 137  K 4.3  CL 99  GLUCOSE 221*  BUN 15  CREATININE 0.70  CALCIUM 8.4   CBG (last 3)   Recent Labs  11/21/14 1656 11/21/14 2046 11/22/14 0653  GLUCAP 292* 343* 248*    Wt Readings from Last 3 Encounters:  11/21/14 100.3 kg (221 lb 1.9 oz)  11/13/14 91.5 kg (201 lb 11.5 oz)  10/02/14 88.814 kg (195 lb 12.8 oz)    Physical Exam:   Constitutional: He is oriented to person, place, and time. He appears well-developed and well-nourished. Verbose HENT:  Head: Normocephalic and atraumatic.  Eyes: Conjunctivae are normal. Pupils are equal, round, and reactive to light.  Neck:  Immobilized in collar. Incision clean, dry and intact. Persistent bruising and edema about neck/op site Cardiovascular: Normal rate and regular rhythm.  No murmur heard. Respiratory: Effort normal and breath sounds normal.  GI: Distended and tympanic (ascites?). Diastasis recti evident. Bowel sounds are normal. Abdomen non-tender Musculoskeletal: He exhibits no edema or tenderness.  Neurological: He is alert and oriented to person, place, and time.    dysarthria  . Able to follow commands without difficulty. Sensory deficits BLE and bilateral upper exts and trunk---1+/2. UES grossly 5/5 proximal to distal. LE's 4/5 HF, KE to 5/5 distally at ankles. still impulsive and safety judgement impaired. Impaired balance.  Skin: Skin is warm and dry. Ecchymotic areas on BUE. BLE with excoriated areas.  Psychiatric: He has a  normal mood and affect. His behavior is normal Assessment/Plan: 1. 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 Physiatrist is providing close team supervision and 24 hour management of active medical problems listed below. Physiatrist and rehab team continue to assess barriers to discharge/monitor patient progress toward functional and medical goals. FIM: FIM - Bathing Bathing Steps Patient Completed: Chest, Right Arm, Left Arm, Abdomen, Front perineal area, Buttocks, Right upper leg, Left upper leg, Right lower leg (including foot), Left lower leg (including foot) Bathing: 4: Steadying assist  FIM - Upper Body Dressing/Undressing Upper body dressing/undressing steps patient completed: Thread/unthread right sleeve of pullover shirt/dresss, Pull shirt over trunk, Put head through opening of pull over shirt/dress Upper body dressing/undressing: 4: Min-Patient completed 75 plus % of tasks FIM - Lower Body Dressing/Undressing Lower body dressing/undressing steps patient completed: Thread/unthread left pants leg, Thread/unthread right pants leg, Pull pants up/down, Thread/unthread right underwear leg, Pull underwear up/down, Thread/unthread left underwear leg Lower body dressing/undressing: 3: Mod-Patient completed 50-74% of tasks  FIM - Toileting Toileting steps completed by patient: Adjust clothing prior to toileting, Adjust clothing after toileting, Performs perineal hygiene Toileting: 4: Steadying assist  FIM - Radio producer Devices: Bedside commode, Insurance account manager Transfers: 4-To toilet/BSC: Min A (steadying Pt. > 75%), 4-From toilet/BSC: Min A (steadying Pt. > 75%)  FIM - Bed/Chair Transfer Bed/Chair Transfer: 4: Bed > Chair or W/C: Min A (  steadying Pt. > 75%), 4: Chair or W/C > Bed: Min A (steadying Pt.  > 75%)  FIM - Locomotion: Wheelchair Locomotion: Wheelchair: 0: Activity did not occur FIM - Locomotion: Ambulation Ambulation/Gait Assistance: 4: Min assist, 3: Mod assist Locomotion: Ambulation: 3: Travels 150 ft or more with moderate assistance (Pt: 50 - 74%)  Comprehension Comprehension Mode: Auditory Comprehension: 3-Understands basic 50 - 74% of the time/requires cueing 25 - 50%  of the time  Expression Expression Mode: Verbal Expression: 3-Expresses basic 50 - 74% of the time/requires cueing 25 - 50% of the time. Needs to repeat parts of sentences.  Social Interaction Social Interaction: 2-Interacts appropriately 25 - 49% of time - Needs frequent redirection.  Problem Solving Problem Solving: 1-Solves basic less than 25% of the time - needs direction nearly all the time or does not effectively solve problems and may need a restraint for safety  Memory Memory: 2-Recognizes or recalls 25 - 49% of the time/requires cueing 51 - 75% of the time Medical Problem List and Plan: 1. Functional deficits secondary to Cervical spondylosis with myelopathy s/p decompressive lam.  2. DVT Prophylaxis/Anticoagulation: Mechanical: Sequential compression devices, below knee Bilateral lower extremities 3. Pain Management: prn oxycodone effective.  4. Mood: Will continue zoloft daily. LCSW to follow for evaluation and support.  5. Neuropsych: This patient is capable of making decisions on his own behalf. 6. Skin/Wound Care: Routine pressure relief measures.  7. Fluids/Electrolytes/Nutrition: Monitor I/O.  8. DM type 2 with retinopathy: sugars uncontrolled presently. Increase lantus to 60 units qhs (on 80 u at home) with SSI as needed for elevated BS.  9. NASH: Continue lactulose and Xifaxan bid. Ammonia level within normal limits  -On questran to help with diarrhea.  10. COPD: Respiratory status stable.  11. HTN: will monitor tid. Continue Corgard, Lasix and Norvasc.  12.  Glaucoma: Continue Timoptic and Alphagan bid.  13. Thrombocytopenia: 50k  14. Chronic anemia: hgb 10.4  LOS (Days) 2 A FACE TO FACE EVALUATION WAS PERFORMED  Tailynn Armetta T 11/22/2014 8:19 AM

## 2014-11-22 NOTE — Progress Notes (Signed)
Occupational Therapy Session Note  Patient Details  Name: [name] MRN: [id number] Date of Birth: Apr 28, 1946  Today's Date: 11/22/2014 OT Individual Time: 0930-1030 OT Calculated Individual Time (min): 60 min   Short Term Goals: Week 1:  OT Short Term Goal 1 (Week 1): STG=LTG due to short ELOS  Skilled Therapeutic Interventions/Progress Updates:  Skilled OT session focusing on ADL retraining, functional ambulation, functional transfers, UE strength and ROM, and activity tolerance/endurance. Pt supine in bed with HOB elevated and asleep when arriving, requiring verbal and physical cueing for arousing. Pt transferred to EOB with min A and mod v/c for sequencing movements. Pt transferred to w/c using RW with min A. Pt completed UB/LB bathing and dressing at sink taking more than reasonable amount of time, donning gown as he had no pants available. RN arrived and administered medication. Pt practiced transfer to TTB in shower and then to 3-in-1 commode, requiring min v/c for locating equipment. Pt completed 2 sets of 4 UE strength and ROM exercises, 10 repetitions each. Pt seated in w/c with all needs nearby when leaving. Noted pt confusion throughout session (more than noted in previous session) and pt unable to state date, day or week, or location.  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

## 2014-11-22 NOTE — Plan of Care (Signed)
Problem: SCI BOWEL ELIMINATION Goal: RH STG MANAGE BOWEL WITH ASSISTANCE STG Manage Bowel with min.Assistance.  Outcome: Progressing  Problem: RH SKIN INTEGRITY Goal: RH STG SKIN FREE OF INFECTION/BREAKDOWN With mod. assist  Outcome: Progressing Goal: RH STG MAINTAIN SKIN INTEGRITY WITH ASSISTANCE STG Maintain Skin Integrity With min. Assistance.  Outcome: Progressing

## 2014-11-22 NOTE — Progress Notes (Signed)
[facility] Individual Statement of Services  Patient Name:  [name]  Date:  11/22/2014  Welcome to the [facility].  Our goal is to provide you with an individualized program based on your diagnosis and situation, designed to meet your specific needs.  With this comprehensive rehabilitation program, you will be expected to participate in at least 3 hours of rehabilitation therapies Monday-Friday, with modified therapy programming on the weekends.  Your rehabilitation program will include the following services:  Physical Therapy (PT), Occupational Therapy (OT), Speech Therapy (ST), 24 hour per day rehabilitation nursing, Case Management (Social Worker), Rehabilitation Medicine, Nutrition Services and Pharmacy Services  Weekly team conferences will be held on Tuesdays to discuss your progress.  Your Social Worker will talk with you frequently to get your input and to update you on team discussions.  Team conferences with you and your family in attendance may also be held.  Expected length of stay:   5 to 7 days  Overall anticipated outcome:  Supervision  Depending on your progress and recovery, your program may change. Your Social Worker will coordinate services and will keep you informed of any changes. Your Social Worker's name and contact numbers are listed  below.  The following services may also be recommended but are not provided by the [facility] will be made to provide these services after discharge if needed.  Arrangements include referral to agencies that provide these services.  Your insurance has been verified to be:  Medicare/Medicaid Your primary doctor is:  Dr. Adrian Prows  Pertinent information will be shared with your doctor and your insurance company.  Social Worker:  Alfonse Alpers,  LCSW  [phone] or ([phone]  Information discussed with and copy given to patient by: Trey Sailors, 11/22/2014, 9:41 AM

## 2014-11-22 NOTE — Progress Notes (Signed)
Physical Therapy Session Note  Patient Details  Name: SINCLAIR ALLIGOOD MRN: [id number] Date of Birth: 01/30/1946  Today's Date: 11/22/2014 PT Individual Time: 1300-1400 PT Individual Time Calculation (min): 60 min   Short Term Goals: Week 1:  PT Short Term Goal 1 (Week 1): STG=LTG due to ELOS  Skilled Therapeutic Interventions/Progress Updates:  1:1. Pt received sitting in w/c at start of session, ready for therapy. Family present upon entry, but left shortly after. Focus this session on functional endurance, functional transfers, NMR and gait training. Pt req min-mod multimodal cues throughout session for safety due to impulsivity and poor emergent awareness. Pt practiced multiple t/f sit<>stand with emphasis on hand placement, anterior weight shift and knee flexion to prevent stabilization against w/c due to genu recurvatum, req min-mod A overall. Pt propelled w/c 175'x1 with B LE, emphasis on fluid reciprocal pattern with consistent R LE advancement.   Strong emphasis this session on NMR in standing to target postural control, emergent awareness, anterior weight shift, quad control, increased reliance on B LE and balance. Tasks included:  - picking up horseshoes at chest level around the gym - managing medicine bottles in standing without B UE support - playing round of horseshoes with single UE support on RW, standing with heels on red foam wedge, reaching inside BOS to R side  Pt req overall mod-max A for standing balance with consistent manual facilitation to prevent genu recurvatum and posterior LOB, max cues for safety and emergent awareness regarding gross body positioning.   Pt req mod-max A for ambulation 175'x1 with RW due to variable posterior lean as well as proximity to RW resulting in non-fluid dysmetric step lengths, especially during turns. Min cues for selective attention to task in mildly busy gym.   Pt left sitting in w/c at end of session w/ all needs in reach and quick  release belt in place.   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

## 2014-11-22 NOTE — Progress Notes (Signed)
Speech Language Pathology Daily Session Note  Patient Details  Name: [name] MRN: [id number] Date of Birth: 1946/10/09  Today's Date: 11/22/2014 SLP Individual Time: 0800-0900 SLP Individual Time Calculation (min): 60 min  Short Term Goals: Week 1: SLP Short Term Goal 1 (Week 1): Patient will consume trial tray of Dys.2 textures and thin liquids with Supervision cues to utilize safe swallow strategies to minimize overt s/s of aspiration.  SLP Short Term Goal 2 (Week 1): Patient will perform pharyngeal strengthening exercises with Min verbal cues  Skilled Therapeutic Interventions: Skilled treatment session focused on dysphagia goals. Upon arrival, patient was awake while supine in bed and agreeable to participate in treatment session. Patient was repositioned in bed with assistance from RN for optimal positioning for PO intake. SLP facilitated session by providing tray set-up due to patient's visual deficits and intermittent verbal cues for utilization of small bites/sips with trial breakfast tray of Dys. 2 textures with thin liquids via straw.  Patient demonstrated efficient mastication without dentition and was initially consuming meal with use of multiple swallows with solids and liquids without overt s/s of aspiration , however, patient demonstrated overt cough X 3 at end of meal, suspect reflexive cough was strong enough to clear suspected penetrates/aspirates.  Recommend patient continue current diet of Dys. 1 textures with intermittent supervision and continue trial trays of upgraded textures with SLP only. Patient left upright in bed with all needs within reach. Continue with current plan of care.    FIM:  Comprehension Comprehension Mode: Auditory Comprehension: 4-Understands basic 75 - 89% of the time/requires cueing 10 - 24% of the time Expression Expression Mode: Verbal Expression: 3-Expresses basic 50 - 74% of the time/requires cueing 25 - 50% of the time. Needs to repeat  parts of sentences. Social Interaction Social Interaction: 4-Interacts appropriately 75 - 89% of the time - Needs redirection for appropriate language or to initiate interaction. Problem Solving Problem Solving: 2-Solves basic 25 - 49% of the time - needs direction more than half the time to initiate, plan or complete simple activities Memory Memory: 2-Recognizes or recalls 25 - 49% of the time/requires cueing 51 - 75% of the time FIM - Eating Eating Activity: 5: Set-up assist for open containers;5: Supervision/cues  Pain Pain Assessment Pain Assessment: No/denies pain  Therapy/Group: Individual Therapy  [name] 11/22/2014, 11:24 AM

## 2014-11-23 ENCOUNTER — Inpatient Hospital Stay (HOSPITAL_COMMUNITY): Payer: Medicare Other | Admitting: Speech Pathology

## 2014-11-23 ENCOUNTER — Encounter (HOSPITAL_COMMUNITY): Payer: Medicare Other | Admitting: Occupational Therapy

## 2014-11-23 ENCOUNTER — Inpatient Hospital Stay (HOSPITAL_COMMUNITY): Payer: Medicare Other

## 2014-11-23 DIAGNOSIS — D649 Anemia, unspecified: Secondary | ICD-10-CM | POA: Diagnosis present

## 2014-11-23 DIAGNOSIS — N390 Urinary tract infection, site not specified: Secondary | ICD-10-CM | POA: Diagnosis present

## 2014-11-23 DIAGNOSIS — D696 Thrombocytopenia, unspecified: Secondary | ICD-10-CM | POA: Diagnosis present

## 2014-11-23 DIAGNOSIS — R4189 Other symptoms and signs involving cognitive functions and awareness: Secondary | ICD-10-CM | POA: Diagnosis present

## 2014-11-23 LAB — GLUCOSE, CAPILLARY
Glucose-Capillary: 188 mg/dL — ABNORMAL HIGH (ref 70–99)
Glucose-Capillary: 278 mg/dL — ABNORMAL HIGH (ref 70–99)
Glucose-Capillary: 231 mg/dL — ABNORMAL HIGH (ref 70–99)
Glucose-Capillary: 267 mg/dL — ABNORMAL HIGH (ref 70–99)

## 2014-11-23 LAB — URINE CULTURE: Colony Count: >=100000

## 2014-11-23 LAB — CBC
HCT: 29.9 % — ABNORMAL LOW (ref 39.0–52.0)
Hemoglobin: 9.9 g/dL — ABNORMAL LOW (ref 13.0–17.0)
MCH: 29.8 pg (ref 26.0–34.0)
MCHC: 33.1 g/dL (ref 30.0–36.0)
MCV: 90.1 fL (ref 78.0–100.0)
PLATELETS: 49 10*3/uL — AB (ref 150–400)
RBC: 3.32 MIL/uL — ABNORMAL LOW (ref 4.22–5.81)
RDW: 15.9 % — AB (ref 11.5–15.5)
WBC: 4.7 10*3/uL (ref 4.0–10.5)

## 2014-11-23 MED ORDER — METHOCARBAMOL 500 MG PO TABS
500.0000 mg | ORAL_TABLET | Freq: Three times a day (TID) | ORAL | Status: DC | PRN
  Administered 2014-11-24: 500 mg via ORAL
  Filled 2014-11-23: qty 1

## 2014-11-23 MED ORDER — TRAMADOL HCL 50 MG PO TABS
50.0000 mg | ORAL_TABLET | Freq: Three times a day (TID) | ORAL | Status: DC | PRN
  Administered 2014-11-24 – 2014-11-28 (×5): 50 mg via ORAL
  Filled 2014-11-23 (×4): qty 1

## 2014-11-23 MED ORDER — INSULIN GLARGINE 100 UNIT/ML ~~LOC~~ SOLN
80.0000 [IU] | Freq: Every day | SUBCUTANEOUS | Status: DC
  Administered 2014-11-23 – 2014-11-27 (×5): 80 [IU] via SUBCUTANEOUS
  Filled 2014-11-23 (×5): qty 0.8

## 2014-11-23 MED ORDER — METHOCARBAMOL 1000 MG/10ML IJ SOLN
500.0000 mg | Freq: Three times a day (TID) | INTRAVENOUS | Status: DC | PRN
  Filled 2014-11-23: qty 5

## 2014-11-23 MED ORDER — CIPROFLOXACIN HCL 250 MG PO TABS
250.0000 mg | ORAL_TABLET | Freq: Two times a day (BID) | ORAL | Status: AC
  Administered 2014-11-23 – 2014-11-27 (×10): 250 mg via ORAL
  Filled 2014-11-23 (×10): qty 1

## 2014-11-23 MED ORDER — TRAMADOL HCL 50 MG PO TABS
50.0000 mg | ORAL_TABLET | Freq: Once | ORAL | Status: AC
  Administered 2014-11-23: 50 mg via ORAL

## 2014-11-23 NOTE — Plan of Care (Signed)
Problem: RH SKIN INTEGRITY Goal: RH STG SKIN FREE OF INFECTION/BREAKDOWN With mod. assist  Outcome: Progressing

## 2014-11-23 NOTE — Progress Notes (Signed)
Occupational Therapy Session Note  Patient Details  Name: [name] MRN: [id number] Date of Birth: 03-17-1946  Today's Date: 11/23/2014 OT Individual Time: 9629-5284 OT Calculated Individual Time (min): 60 min   Short Term Goals: Week 1:  OT Short Term Goal 1 (Week 1): STG=LTG due to short ELOS  Skilled Therapeutic Interventions/Progress Updates:  Skilled OT session focusing on ADL retraining, functional ambulation, functional transfers, sit<>stand transfers, safety awareness, and activity tolerance/endurance. Pt supine in bed when arriving, reporting slight pain in shoulders and RN made aware of request for medication. Pt unable to state date and day of week but did say "the week is almost over." Pt transferred to EOB with supervision, needing less assistance than during previous session. Pt ambulated to bathroom, transferred to TTB, and completed UB/LB bathing with steadying assist and min v/c for finding and operating shower head. Pt ambulated using RW to EOB and completed LB dressing, requiring assistance with donning L sock. Pt transferred to supine and therapist replaced pads of cervical collar. Pt completed UB dressing at EOB, transferred to w/c, and washed used pads of c-collar at sink. RN arrived and administered medication. Pt seated in w/c with all needs nearby and RN in room when leaving. Pt requiring less assistance and appearing less confused than during previous session.  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

## 2014-11-23 NOTE — Progress Notes (Signed)
Nursing Note: Pt unable to void. No void this shift.A; Pt in and out cathed for 900 cc.wbb

## 2014-11-23 NOTE — Plan of Care (Signed)
Problem: SCI BOWEL ELIMINATION Goal: RH STG MANAGE BOWEL WITH ASSISTANCE STG Manage Bowel with min.Assistance.  Outcome: Progressing

## 2014-11-23 NOTE — IPOC Note (Signed)
Overall Plan of Care [facility]) Patient Details Name: IZYAN EZZELL MRN: [id number] DOB: 1946-11-28  Admitting Diagnosis: cerv decompression  Hospital Problems: Principal Problem:   Cervical spondylosis with myelopathy Active Problems:   Type 2 diabetes mellitus, uncontrolled, with retinopathy   History of COPD   NASH (nonalcoholic steatohepatitis)   UTI (lower urinary tract infection)   Cognitive deficits   Thrombocytopenia   acute on chronic anemia     Functional Problem List: Nursing Bladder, Bowel, Pain, Safety, Skin Integrity  PT Balance, Safety, Pain, Perception, Motor  OT Balance, Vision, Cognition, Endurance, Motor, Safety, Skin Integrity  SLP Nutrition  TR Endurance, Motor, Pain, Safety, Skin Integrity       Basic ADL's: OT Grooming, Bathing, Dressing, Toileting     Advanced  ADL's: OT       Transfers: PT Bed Mobility, Bed to Chair, Car, [employer], Futures trader, Metallurgist: PT Ambulation, Emergency planning/management officer     Additional Impairments: OT    SLP Swallowing      TR      Anticipated Outcomes Item Anticipated Outcome  Self Feeding    Swallowing  Mod I    Basic self-care  Supervision  Toileting  Supervision   Bathroom Transfers Supervision  Bowel/Bladder  Continent of bowel and bladder.  Transfers  Supervision  Locomotion  Supervision  Communication     Cognition     Pain  Pain level less than 3.On scale 1 to 10.  Safety/Judgment  Pt. will be free from fall during his stay in rehab.   Therapy Plan: PT Intensity: Minimum of 1-2 x/day ,45 to 90 minutes PT Frequency: 5 out of 7 days PT Duration Estimated Length of Stay: 5-7 days OT Intensity: Minimum of 1-2 x/day, 45 to 90 minutes OT Frequency: 5 out of 7 days OT Duration/Estimated Length of Stay: 7-10 days SLP Intensity: Minumum of 1-2 x/day, 30 to 90 minutes SLP Frequency: 5 out of 7 days SLP Duration/Estimated Length of Stay: 1-2 weeks for SLP       Team  Interventions: Nursing Interventions Patient/Family Education, Disease Management/Prevention, Pain Management, Medication Management, Skin Care/Wound Management  PT interventions Ambulation/gait training, Balance/vestibular training, Discharge planning, DME/adaptive equipment instruction, Functional mobility training, Neuromuscular re-education, Patient/family education, Therapeutic Exercise, Therapeutic Activities, UE/LE Strength taining/ROM, UE/LE Coordination activities, Visual/perceptual remediation/compensation  OT Interventions Balance/vestibular training, Community reintegration, Cognitive remediation/compensation, Discharge planning, DME/adaptive equipment instruction, Patient/family education, Psychosocial support, Self Care/advanced ADL retraining, Therapeutic Activities, Therapeutic Exercise, Skin care/wound managment, UE/LE Strength taining/ROM, UE/LE Coordination activities, Visual/perceptual remediation/compensation  SLP Interventions Cueing hierarchy, Dysphagia/aspiration precaution training, Environmental controls, Functional tasks, Patient/family education, Other (comment) (Pharyngeal strengthening exercises)  TR Interventions    SW/CM Interventions Discharge Planning, Psychosocial Support, Patient/Family Education    Team Discharge Planning: Destination: PT-Home ,OT- Home , SLP-Home Projected Follow-up: PT-Home health PT, OT-   (TBD), SLP-None Projected Equipment Needs: PT- , OT- To be determined, SLP-None recommended by SLP Equipment Details: PT-Pt owns RW prior to admission, do not anticipate need for w/c., OT-  Patient/family involved in discharge planning: PT- Patient, Family member/caregiver,  OT-Patient, SLP-Patient  MD ELOS: 7-9 days Medical Rehab Prognosis:  Excellent Assessment: The patient has been admitted for CIR therapies with the diagnosis of cervical myelopathy s/p decompression. The team will be addressing functional mobility, strength, stamina, balance, safety,  adaptive techniques and equipment, self-care, bowel and bladder mgt, patient and caregiver education, NMR, pain mgt, neck precautions, swallowing, safety awareness. Goals have been set at  supervision for self-care, ADL's, and mobility.    Meredith Staggers, MD, FAAPMR      See Team Conference Notes for weekly updates to the plan of care

## 2014-11-23 NOTE — Plan of Care (Signed)
Problem: RH SKIN INTEGRITY Goal: RH STG MAINTAIN SKIN INTEGRITY WITH ASSISTANCE STG Maintain Skin Integrity With min Assistance.  Outcome: Progressing     

## 2014-11-23 NOTE — Progress Notes (Addendum)
[name] PHYSICAL MEDICINE & REHABILITATION     PROGRESS NOTE    Subjective/Complaints: Had neck pain, h/a---oxycodone worked but "knocked him out"  Objective: Vital Signs: Blood pressure 119/56, pulse 83, temperature 97.6 F (36.4 C), temperature source Oral, resp. rate 17, height 5' (1.524 m), weight 100.3 kg (221 lb 1.9 oz), SpO2 97 %. No results found.  Recent Labs  11/21/14 0706 11/23/14 0600  WBC 5.0 4.7  HGB 10.4* 9.9*  HCT 31.2* 29.9*  PLT 50* 49*    Recent Labs  11/21/14 0706  NA 137  K 4.3  CL 99  GLUCOSE 221*  BUN 15  CREATININE 0.70  CALCIUM 8.4   CBG (last 3)   Recent Labs  11/22/14 1630 11/22/14 2038 11/23/14 0642  GLUCAP 227* 269* 188*    Wt Readings from Last 3 Encounters:  11/21/14 100.3 kg (221 lb 1.9 oz)  11/13/14 91.5 kg (201 lb 11.5 oz)  10/02/14 88.814 kg (195 lb 12.8 oz)    Physical Exam:   Constitutional: He is oriented to person, place, and time. He appears well-developed and well-nourished. Verbose HENT:  Head: Normocephalic and atraumatic.  Eyes: Conjunctivae are normal. Pupils are equal, round, and reactive to light.  Neck:  Immobilized in collar. Incision clean, dry and intact. Persistent bruising and edema about neck/op site Cardiovascular: Normal rate and regular rhythm.  No murmur heard. Respiratory: Effort normal and breath sounds normal.  GI: Distended and tympanic (ascites?). Diastasis recti evident. Bowel sounds are normal. Abdomen non-tender Musculoskeletal: He exhibits no edema or tenderness.  Neurological: He is alert and oriented to person, place, and time.    dysarthria  . Able to follow commands without difficulty. Sensory deficits BLE and bilateral upper exts and trunk---1+/2. UES grossly 5/5 proximal to distal. LE's 4/5 HF, KE to 5/5 distally at ankles. still impulsive and safety judgement impaired. Impaired balance.  Skin: Skin is warm and dry. Ecchymotic areas on BUE. BLE with excoriated areas.   Psychiatric: He has a normal mood and affect. His behavior is normal Assessment/Plan: 1. 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 Physiatrist is providing close team supervision and 24 hour management of active medical problems listed below. Physiatrist and rehab team continue to assess barriers to discharge/monitor patient progress toward functional and medical goals. FIM: FIM - Bathing Bathing Steps Patient Completed: Chest, Right Arm, Left Arm, Abdomen, Front perineal area, Buttocks, Right upper leg, Left upper leg, Right lower leg (including foot), Left lower leg (including foot) Bathing: 4: Steadying assist  FIM - Upper Body Dressing/Undressing Upper body dressing/undressing steps patient completed: Thread/unthread right sleeve of pullover shirt/dresss, Thread/unthread left sleeve of pullover shirt/dress Upper body dressing/undressing: 0: Activity did not occur FIM - Lower Body Dressing/Undressing Lower body dressing/undressing steps patient completed: Thread/unthread left pants leg, Thread/unthread right pants leg, Pull pants up/down, Thread/unthread right underwear leg, Pull underwear up/down, Thread/unthread left underwear leg Lower body dressing/undressing: 0: Wears gown/pajamas-no public clothing  FIM - Toileting Toileting steps completed by patient: Adjust clothing prior to toileting, Adjust clothing after toileting, Performs perineal hygiene Toileting: 4: Steadying assist  FIM - Radio producer Devices: Engineer, civil (consulting), Insurance account manager Transfers: 4-To toilet/BSC: Min A (steadying Pt. > 75%), 4-From toilet/BSC: Min A (steadying Pt. > 75%)  FIM - Bed/Chair Transfer Bed/Chair Transfer Assistive Devices: Arm rests, Adult nurse Transfer: 3: Chair or W/C > Bed: Mod  A (lift or lower assist),  3: Bed > Chair or W/C: Mod A (lift or lower assist)  FIM - Locomotion: Wheelchair Locomotion: Wheelchair: 4: Travels 150 ft or more: maneuvers on rugs and over door sillls with minimal assistance (Pt.>75%) FIM - Locomotion: Ambulation Locomotion: Ambulation Assistive Devices: Administrator Ambulation/Gait Assistance: 3: Mod assist Locomotion: Ambulation: 3: Travels 150 ft or more with moderate assistance (Pt: 50 - 74%)  Comprehension Comprehension Mode: Auditory Comprehension: 4-Understands basic 75 - 89% of the time/requires cueing 10 - 24% of the time  Expression Expression Mode: Verbal Expression: 3-Expresses basic 50 - 74% of the time/requires cueing 25 - 50% of the time. Needs to repeat parts of sentences.  Social Interaction Social Interaction: 4-Interacts appropriately 75 - 89% of the time - Needs redirection for appropriate language or to initiate interaction.  Problem Solving Problem Solving: 2-Solves basic 25 - 49% of the time - needs direction more than half the time to initiate, plan or complete simple activities  Memory Memory: 2-Recognizes or recalls 25 - 49% of the time/requires cueing 51 - 75% of the time Medical Problem List and Plan: 1. Functional deficits secondary to Cervical spondylosis with myelopathy s/p decompressive lam.  2. DVT Prophylaxis/Anticoagulation: Mechanical: Sequential compression devices, below knee Bilateral lower extremities 3. Pain Management: prn ultram and robaxin effective.   -need to limit due to neuro-sedating se 4. Mood: Will continue zoloft daily. LCSW to follow for evaluation and support.  5. Neuropsych: This patient is capable of making decisions on his own behalf. 6. Skin/Wound Care: Routine pressure relief measures.  7. Fluids/Electrolytes/Nutrition: Monitor I/O.  8. DM type 2 with retinopathy: sugars uncontrolled presently. Increase lantus to 80 units qhs which is his home dose with SSI as needed for elevated BS.  9.  NASH: Continue lactulose and Xifaxan bid. Ammonia level within normal limits  -On questran to help with diarrhea.  10. COPD: Respiratory status stable.  11. HTN: will monitor tid. Continue Corgard, Lasix and Norvasc.  12. Glaucoma: Continue Timoptic and Alphagan bid.  13. Thrombocytopenia: platelets running around 50k  14. Chronic anemia: hgb 10.4--->9.9  LOS (Days) 3 A FACE TO FACE EVALUATION WAS PERFORMED  [name] T 11/23/2014 8:17 AM

## 2014-11-23 NOTE — Progress Notes (Signed)
Urine culture positive for Kleb UTI--likely a cause of increased confusion. . Will start on cipro for treatment.

## 2014-11-23 NOTE — Progress Notes (Signed)
Speech Language Pathology Daily Session Note  Patient Details  Name: [name] MRN: [id number] Date of Birth: 02-Feb-1946  Today's Date: 11/23/2014 SLP Individual Time: 1100-1200 SLP Individual Time Calculation (min): 60 min  Short Term Goals: Week 1: SLP Short Term Goal 1 (Week 1): Patient will consume trial tray of Dys.2 textures and thin liquids with Supervision cues to utilize safe swallow strategies to minimize overt s/s of aspiration.  SLP Short Term Goal 2 (Week 1): Patient will perform pharyngeal strengthening exercises with Min verbal cues  Skilled Therapeutic Interventions: Skilled treatment session focused on dysphagia goals. Upon arrival, patient was sitting upright in the wheelchair and agreeable to participate in therapy. Patient donned his upper and lower dentures with assistance, however, the top dentures had to eventually be removed due to them being loose fitting despite use of adhesive. Patient consumed trials of Dys. 2 textures and demonstrated efficient mastication with minimal lingual residue that cleared with a liquid wash without overt s/s of aspiration. Patient also consumed thin liquids via straw without difficulty. This clinician was unable to provide skilled observation with a complete meal of Dys. 2 textures due to complications from hospital wide computer outage, therefore, recommend patient remain on Dys. 1 textures at this time and continue trials of Dys. 2 textures with SLP only. Patient left in wheelchair with all needs within reach. Continue with current plan of care.    FIM:  Comprehension Comprehension Mode: Auditory Comprehension: 4-Understands basic 75 - 89% of the time/requires cueing 10 - 24% of the time Expression Expression Mode: Verbal Expression: 3-Expresses basic 50 - 74% of the time/requires cueing 25 - 50% of the time. Needs to repeat parts of sentences. Social Interaction Social Interaction: 4-Interacts appropriately 75 - 89% of the time -  Needs redirection for appropriate language or to initiate interaction. Problem Solving Problem Solving: 3-Solves basic 50 - 74% of the time/requires cueing 25 - 49% of the time Memory Memory: 3-Recognizes or recalls 50 - 74% of the time/requires cueing 25 - 49% of the time FIM - Eating Eating Activity: 5: Set-up assist for open containers;5: Supervision/cues  Pain Pain Assessment Pain Assessment: No/denies pain  Therapy/Group: Individual Therapy  Ariya Bohannon 11/23/2014, 12:13 PM

## 2014-11-23 NOTE — Progress Notes (Signed)
Inpatient Diabetes Program Recommendations  AACE/ADA: New Consensus Statement on Inpatient Glycemic Control (2013)  Target Ranges:  Prepandial:   less than 140 mg/dL      Peak postprandial:   less than 180 mg/dL (1-2 hours)      Critically ill patients:  140 - 180 mg/dL   Results for BENOIT, MEECH (MRN [id number]) as of 11/23/2014 10:40  Ref. Range 11/22/2014 06:53 11/22/2014 12:11 11/22/2014 16:30 11/22/2014 20:38 11/23/2014 06:42  Glucose-Capillary Latest Range: 70-99 mg/dL 248 (H) 337 (H) 227 (H) 269 (H) 188 (H)    Reason for Assessment: elevated blood sugars  Diabetes history: Type 2 Outpatient Diabetes medications: Lantus 80 units qhs, Novolog sliding scale with meals Current orders for Inpatient glycemic control: Lantus 80 units qhs, Novolog resistant correction scale with meal  Please consider 7 units of Novolog tid with meals (post prandial elevations), maintain current Novolog correction scale tid, add Novolog correction 0-5 units at hs and maintain current dose of Lantus insulin at 60 units as fasting CBG today was 188mg /dl.   Gentry Fitz, RN, BA, MHA, CDE Diabetes Coordinator Inpatient Diabetes Program  [phone] (Team Pager) [phone] [facility]) 11/23/2014 10:45 AM

## 2014-11-23 NOTE — Progress Notes (Signed)
Physical Therapy Session Note  Patient Details  Name: [name] MRN: [id number] Date of Birth: 06/25/1946  Today's Date: 11/23/2014 PT Individual Time: 0905-1005 PT Individual Time Calculation (min): 60 min   Short Term Goals: Week 1:  PT Short Term Goal 1 (Week 1): STG=LTG due to ELOS  Skilled Therapeutic Interventions/Progress Updates:   Pt propelled w/c with overall S for general strengthening and endurance with extra time. Gait training with RW for functional mobility training and overall min A when walking in straight pathway. Dynamic gait through obstacle course to simulate home environment with min to mod A needed for obstacle negotiation (navigating cones and stepping over small threshold to simulate home entry; repeated x 2 reps) as pt with LOB posterior multiple times requiring mod A to recover and cues for RW positioning and safety. Administered Berg Balance test to formally assess fall risk and discussed results with pt (see below). Test indicates 100% fall risk. Nustep for general strengthening and endurance of BUE and BLE x 5 min on level 4.   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     Balance: Standardized Balance Assessment Standardized Balance Assessment: Berg Balance Test Berg Balance Test Sit to Stand: Needs minimal aid to stand or to stabilize Standing Unsupported: Unable to stand 30 seconds unassisted (steady A needed throughout) Sitting with Back Unsupported but Feet Supported on Floor or Stool: Able to sit safely and securely 2 minutes Stand to Sit: Uses backs of legs against chair to control descent Transfers: Needs one person to assist Standing Unsupported with Eyes Closed: Able  to stand 3 seconds Standing Ubsupported with Feet Together: Needs help to attain position and unable to hold for 15 seconds (pt with genu varusl; physically unable to obtain position) From Standing, Reach Forward with Outstretched Arm: Loses balance while trying/requires external support From Standing Position, Pick up Object from Floor: Unable to try/needs assist to keep balance From Standing Position, Turn to Look Behind Over each Shoulder: Needs assist to keep from losing balance and falling (limited due to cervical collar but limited weightshift noted) Turn 360 Degrees: Needs assistance while turning Standing Unsupported, Alternately Place Feet on Step/Stool: Able to complete >2 steps/needs minimal assist Standing Unsupported, One Foot in Front: Loses balance while stepping or standing Standing on One Leg: Unable to try or needs assist to prevent fall Total Score: 11  See FIM for current functional status  Therapy/Group: Individual Therapy  [name] [facility] 11/23/2014, 10:20 AM

## 2014-11-24 ENCOUNTER — Encounter (HOSPITAL_COMMUNITY): Payer: Medicare Other | Admitting: Occupational Therapy

## 2014-11-24 ENCOUNTER — Inpatient Hospital Stay (HOSPITAL_COMMUNITY): Payer: Medicare Other | Admitting: Speech Pathology

## 2014-11-24 ENCOUNTER — Inpatient Hospital Stay (HOSPITAL_COMMUNITY): Payer: Medicare Other

## 2014-11-24 ENCOUNTER — Inpatient Hospital Stay (HOSPITAL_COMMUNITY): Payer: Medicare Other | Admitting: Occupational Therapy

## 2014-11-24 LAB — GLUCOSE, CAPILLARY
GLUCOSE-CAPILLARY: 353 mg/dL — AB (ref 70–99)
Glucose-Capillary: 183 mg/dL — ABNORMAL HIGH (ref 70–99)
Glucose-Capillary: 299 mg/dL — ABNORMAL HIGH (ref 70–99)
Glucose-Capillary: 120 mg/dL — ABNORMAL HIGH (ref 70–99)

## 2014-11-24 NOTE — Plan of Care (Signed)
Problem: SCI BOWEL ELIMINATION Goal: RH STG MANAGE BOWEL WITH ASSISTANCE STG Manage Bowel with min.Assistance.  Outcome: Progressing  Problem: RH SKIN INTEGRITY Goal: RH STG SKIN FREE OF INFECTION/BREAKDOWN With mod. assist  Outcome: Progressing Goal: RH STG MAINTAIN SKIN INTEGRITY WITH ASSISTANCE STG Maintain Skin Integrity With min. Assistance.  Outcome: Progressing

## 2014-11-24 NOTE — Progress Notes (Signed)
Physical Therapy Session Note  Patient Details  Name: [name] MRN: [id number] Date of Birth: November 22, 1946  Today's Date: 11/24/2014 PT Individual Time: 1410-1500 PT Individual Time Calculation (min): 50 min   Short Term Goals: Week 1:  PT Short Term Goal 1 (Week 1): STG=LTG due to ELOS  Skilled Therapeutic Interventions/Progress Updates:  W/c propulsion x 200' with supervision.  Advanced gait with RW, stepping over 3 canes and up/down threshold with RW, x 2 with min assist> mod assist for LOB during manipulation of RW x 2 over obstacle or on threshold.  neuromuscular re-education via demo, VCs, forced use, for: - bil hip adduction against resistance to facilitate neutral hip rotation with neutral pelvic tilt -forward wt shift in all directions in sitting with visual scanning to tap weighted bar against moving target at waist height -self stretching and balance challenge in standing on wedge with bil UE support with min/mod assist > no support with max assist.  Gait returning to room x 200'; min guard assist including turns to R and L until pt was near his room, then total assist as pt completely lost balance backwards during L turn in order to visualize room number.  During LOB backwards, pt lifted RW up in the air and no balance strategies were apparent.  Pt was fatigued and requested getting back in bed.  Sit> supine with supervision, extra time, with mod cues for safe hand placement and bringing RW with him as he turned.  Bed mobility modified independent to scoot toward HOB.    Bed alarm set and all needs left in reach, including pancake call bell.    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

## 2014-11-24 NOTE — Progress Notes (Signed)
Occupational Therapy Session Notes  Patient Details  Name: [name] MRN: [id number] Date of Birth: Sep 16, 1946  Today's Date: 11/24/2014  Short Term Goals: Week 1:  OT Short Term Goal 1 (Week 1): STG=LTG due to short ELOS  Skilled Therapeutic Interventions/Progress Updates:   Session #1 0900-1000 - 60 Minutes Individual Therapy No complaints of pain Patient received seated in w/c excited to work with therapist. Skilled OT focusing on ADL retraining, sit<>stands, dynamic standing balance/tolerance/endurance, functional use of BUEs, and overall activity tolerance/endurance. Therapist worked with patient on w/c management and patient worked on self propulsion > sink. Patient completed UB/LB bathing & dressing in sit<>stand position from w/c. Patient took more than a reasonable amount of time, but completed ADL in a safe and effective manner. Patient eager to be more independent and is making progress toward OT LTGs. At end of session, left patient seated in w/c with all needs within reach and quick release belt donned.   Session #2  1230-1300 - 30 Minutes Individual Therapy No complaints of pain Therapeutic exercise focusing on functional ambulation, functional strengthening > BUEs & BLEs, functional endurance training, and overall activity tolerance/endurance. Patient ambulated > therapy gym and engaged in 10 minutes of therapeutic exercise on NuStep machine, level 7. Patient took ~1 minute rest break, then ambulated > SCIFIT machine for 7 minutes of BUE therapeutic exercise. Therapist assisted patient back to room and left patient seated in w/c with all needs within reach. Patient with request to use bathroom, therapist notified NT.   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

## 2014-11-24 NOTE — Progress Notes (Signed)
Speech Language Pathology Daily Session Note  Patient Details  Name: [name] PULIDO MRN: [id number] Date of Birth: 02/19/1946  Today's Date: 11/24/2014 SLP Individual Time: 1130-1210 SLP Individual Time Calculation (min): 40 min  Short Term Goals: Week 1: SLP Short Term Goal 1 (Week 1): Patient will consume trial tray of Dys.2 textures and thin liquids with Supervision cues to utilize safe swallow strategies to minimize overt s/s of aspiration.  SLP Short Term Goal 2 (Week 1): Patient will perform pharyngeal strengthening exercises with Min verbal cues  Skilled Therapeutic Interventions: Skilled treatment session focused on dysphagia goals. SLP facilitated session with re-education regarding rationale for recommend safe swallow strategies.  Patient consumed trials of Dys. 3 textures and demonstrated efficient mastication with minimal lingual residue that cleared with a liquid wash without overt s/s of aspiration.  This clinician was unable to provide skilled observation with a complete meal of Dys. 2 textures due to complications from hospital wide computer outage; as a result, recommend initiation of Dys.2 textures diet with thin liquids with full staff supervision to ensure safety.    FIM:  Comprehension Comprehension Mode: Auditory Comprehension: 5-Understands basic 90% of the time/requires cueing < 10% of the time Expression Expression Mode: Verbal Expression: 5-Expresses basic 90% of the time/requires cueing < 10% of the time. Social Interaction Social Interaction: 5-Interacts appropriately 90% of the time - Needs monitoring or encouragement for participation or interaction. Problem Solving Problem Solving: 5-Solves basic 90% of the time/requires cueing < 10% of the time Memory Memory: 5-Recognizes or recalls 90% of the time/requires cueing < 10% of the time FIM - Eating Eating Activity: 5: Set-up assist for open containers;5: Needs verbal cues/supervision  Pain Pain  Assessment Pain Assessment: No/denies pain  Therapy/Group: Individual Therapy  Carmelia Roller., CCC-SLP [phone]  [place] 11/24/2014, 3:20 PM

## 2014-11-24 NOTE — Progress Notes (Addendum)
[name] is a 68 y.o. male May 17, 1946 [id number]  Subjective: No new complaints. No new problems. Slept well. Feeling OK.  Objective: Vital signs in last 24 hours: Temp:  [97.9 F (36.6 C)-98.4 F (36.9 C)] 97.9 F (36.6 C) (12/05 0520) Pulse Rate:  [85-89] 85 (12/05 0520) Resp:  [20] 20 (12/05 0520) BP: (125-132)/(63) 125/63 mmHg (12/05 0520) SpO2:  [97 %-98 %] 98 % (12/05 0520) Weight change:  Last BM Date: 11/22/14  Intake/Output from previous day: 12/04 0701 - 12/05 0700 In: 240 [P.O.:240] Out: -  Last cbgs: CBG (last 3)   Recent Labs  11/23/14 1652 11/23/14 2125 11/24/14 0647  GLUCAP 231* 267* 183*     Physical Exam General: No apparent distress   HEENT: not dry Lungs: Normal effort. Lungs clear to auscultation, no crackles or wheezes. Cardiovascular: Regular rate and rhythm, no edema Abdomen: S/NT/ND; BS(+) Musculoskeletal:  unchanged Neurological: No new neurological deficits Wounds: clean  Neck brace  Skin: clear  Aging changes Mental state: Alert, cooperative    Lab Results: BMET    Component Value Date/Time   NA 137 11/21/2014 0706   K 4.3 11/21/2014 0706   CL 99 11/21/2014 0706   CO2 27 11/21/2014 0706   GLUCOSE 221* 11/21/2014 0706   BUN 15 11/21/2014 0706   CREATININE 0.70 11/21/2014 0706   CALCIUM 8.4 11/21/2014 0706   GFRNONAA >90 11/21/2014 0706   GFRAA >90 11/21/2014 0706   CBC    Component Value Date/Time   WBC 4.7 11/23/2014 0600   RBC 3.32* 11/23/2014 0600   HGB 9.9* 11/23/2014 0600   HCT 29.9* 11/23/2014 0600   PLT 49* 11/23/2014 0600   MCV 90.1 11/23/2014 0600   MCH 29.8 11/23/2014 0600   MCHC 33.1 11/23/2014 0600   RDW 15.9* 11/23/2014 0600   LYMPHSABS 0.7 11/21/2014 0706   MONOABS 0.4 11/21/2014 0706   EOSABS 0.3 11/21/2014 0706   BASOSABS 0.0 11/21/2014 0706    Studies/Results: No results found.  Medications: I have reviewed the patient's current medications.  Assessment/Plan:  1. Functional  deficits secondary to Cervical spondylosis with myelopathy s/p decompressive lam.  2. DVT Prophylaxis/Anticoagulation: Mechanical: Sequential compression devices, below knee Bilateral lower extremities 3. Pain Management: prn ultram and robaxin effective.  -need to limit due to neuro-sedating se 4. Mood: Will continue zoloft daily. LCSW to follow for evaluation and support.  5. Neuropsych: This patient is capable of making decisions on his own behalf. 6. Skin/Wound Care: Routine pressure relief measures.  7. Fluids/Electrolytes/Nutrition: Monitor I/O.  8. DM type 2 with retinopathy: sugars uncontrolled presently. Increase lantus to 80 units qhs which is his home dose with SSI as needed for elevated BS.  9. NASH: Continue lactulose and Xifaxan bid. Ammonia level within normal limits -On questran to help with diarrhea.  10. COPD: Respiratory status stable.  11. HTN: will monitor tid. Continue Corgard, Lasix and Norvasc.  12. Glaucoma: Continue Timoptic and Alphagan bid.  13. Thrombocytopenia: platelets running around 50k  14. Chronic anemia: hgb 10.4--->9.9  Cont Rx    Length of stay, days: 4  Walker Kehr , MD 11/24/2014, 10:15 AM

## 2014-11-25 ENCOUNTER — Inpatient Hospital Stay (HOSPITAL_COMMUNITY): Payer: Medicare Other

## 2014-11-25 LAB — GLUCOSE, CAPILLARY
Glucose-Capillary: 108 mg/dL — ABNORMAL HIGH (ref 70–99)
Glucose-Capillary: 207 mg/dL — ABNORMAL HIGH (ref 70–99)
Glucose-Capillary: 110 mg/dL — ABNORMAL HIGH (ref 70–99)
Glucose-Capillary: 277 mg/dL — ABNORMAL HIGH (ref 70–99)

## 2014-11-25 NOTE — Progress Notes (Signed)
[name] is a 68 y.o. male [birth year] [id number]  Subjective: No new complaints. No new problems. Feeling OK.  Objective: Vital signs in last 24 hours: Temp:  [99 F (37.2 C)] 99 F (37.2 C) (12/05 1635) Pulse Rate:  [92] 92 (12/05 1635) Resp:  [16] 16 (12/05 1635) BP: (144)/(72) 144/72 mmHg (12/05 1635) SpO2:  [99 %] 99 % (12/05 1635) Weight change:  Last BM Date: 11/24/14  Intake/Output from previous day: 12/05 0701 - 12/06 0700 In: 960 [P.O.:960] Out: -  Last cbgs: CBG (last 3)   Recent Labs  11/24/14 1656 11/24/14 2031 11/25/14 0707  GLUCAP 120* 353* 108*     Physical Exam General: No apparent distress   HEENT: not dry Lungs: Normal effort. Lungs clear to auscultation, no crackles or wheezes. Cardiovascular: Regular rate and rhythm, no edema Abdomen: S/NT/ND; BS(+) Musculoskeletal:  unchanged Neurological: No new neurological deficits Wounds: clean  Neck brace is on Skin: clear  Aging changes Mental state: Alert, cooperative    Lab Results: BMET    Component Value Date/Time   NA 137 11/21/2014 0706   K 4.3 11/21/2014 0706   CL 99 11/21/2014 0706   CO2 27 11/21/2014 0706   GLUCOSE 221* 11/21/2014 0706   BUN 15 11/21/2014 0706   CREATININE 0.70 11/21/2014 0706   CALCIUM 8.4 11/21/2014 0706   GFRNONAA >90 11/21/2014 0706   GFRAA >90 11/21/2014 0706   CBC    Component Value Date/Time   WBC 4.7 11/23/2014 0600   RBC 3.32* 11/23/2014 0600   HGB 9.9* 11/23/2014 0600   HCT 29.9* 11/23/2014 0600   PLT 49* 11/23/2014 0600   MCV 90.1 11/23/2014 0600   MCH 29.8 11/23/2014 0600   MCHC 33.1 11/23/2014 0600   RDW 15.9* 11/23/2014 0600   LYMPHSABS 0.7 11/21/2014 0706   MONOABS 0.4 11/21/2014 0706   EOSABS 0.3 11/21/2014 0706   BASOSABS 0.0 11/21/2014 0706    Studies/Results: No results found.  Medications: I have reviewed the patient's current medications.  Assessment/Plan:  1. Functional deficits secondary to Cervical spondylosis  with myelopathy s/p decompressive lam.  2. DVT Prophylaxis/Anticoagulation: Mechanical: Sequential compression devices, below knee Bilateral lower extremities 3. Pain Management: prn ultram and robaxin effective.  -need to limit due to neuro-sedating se 4. Mood: Will continue zoloft daily. LCSW to follow for evaluation and support.  5. Neuropsych: This patient is capable of making decisions on his own behalf. 6. Skin/Wound Care: Routine pressure relief measures.  7. Fluids/Electrolytes/Nutrition: Monitor I/O.  8. DM type 2 with retinopathy: sugars uncontrolled presently. Increase lantus to 80 units qhs which is his home dose with SSI as needed for elevated BS.  9. NASH: Continue lactulose and Xifaxan bid. Ammonia level within normal limits -On questran to help with diarrhea.  10. COPD: Respiratory status stable.  11. HTN: will monitor tid. Continue Corgard, Lasix and Norvasc.  12. Glaucoma: Continue Timoptic and Alphagan bid.  13. Thrombocytopenia: platelets running around 50k  14. Chronic anemia: hgb 10.4--->9.9  Cont current Rx    Length of stay, days: 5  Walker Kehr , MD 11/25/2014, 8:17 AM

## 2014-11-25 NOTE — Progress Notes (Signed)
Physical Therapy Session Note  Patient Details  Name: [name] MRN: [id number] Date of Birth: 1946-07-02  Today's Date: 11/25/2014 PT Individual Time: 1010-1100 PT Individual Time Calculation (min): 50 min   Short Term Goals: Week 1:  PT Short Term Goal 1 (Week 1): STG=LTG due to ELOS  Skilled Therapeutic Interventions/Progress Updates:    Pt receiving nursing care from RN at beginning of session, missed 10 minutes scheduled PT. Pt received handed off from RN, agreeable to participate in therapy. Session focused on standing static and dynamic balance, general endurance, increasing ankle strategies for balance. Propelled w/c w/ BUE 200' to rehab gym w/ SBA, min VC's for avoiding obstacles in environment. Pt completed 10' on Nustep L3 LE only for general cardiovascular endurance.   Pt stood on foam at edge of mat to increase activation of ankle musculature for improved balance. Required Mod-MaxA to stand on foam as pt is unable to use visual system to maintain balance. Pt had 2 UE support and required max cueing for upright posture.  For improved balance and motor control in standing therapist applied min resistance for alternating isometrics in stride stance, noted pt with difficulty correcting posture after isometric posterior activation. Pt also had difficulty activating musculature in anterior chain in order to correct posterior lean  At edge of mat pt completed 2x10 mini-squats w/ 2 UE support, emphasis on anterior lean during sit and slow controlled movements. Pt required ModA to complete squat initially progressing to MinA with improved anterior lean.  Pt ambulated 200' back to room w/ Min-Mod A for route finding, safety. Pt left seated in recliner w/ all needs within reach.   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

## 2014-11-26 ENCOUNTER — Inpatient Hospital Stay (HOSPITAL_COMMUNITY): Payer: Medicare Other | Admitting: Speech Pathology

## 2014-11-26 ENCOUNTER — Inpatient Hospital Stay (HOSPITAL_COMMUNITY): Payer: Medicare Other

## 2014-11-26 ENCOUNTER — Encounter (HOSPITAL_COMMUNITY): Payer: Medicare Other

## 2014-11-26 DIAGNOSIS — D696 Thrombocytopenia, unspecified: Secondary | ICD-10-CM

## 2014-11-26 DIAGNOSIS — N390 Urinary tract infection, site not specified: Secondary | ICD-10-CM

## 2014-11-26 DIAGNOSIS — R4189 Other symptoms and signs involving cognitive functions and awareness: Secondary | ICD-10-CM

## 2014-11-26 LAB — GLUCOSE, CAPILLARY
Glucose-Capillary: 143 mg/dL — ABNORMAL HIGH (ref 70–99)
Glucose-Capillary: 266 mg/dL — ABNORMAL HIGH (ref 70–99)
Glucose-Capillary: 299 mg/dL — ABNORMAL HIGH (ref 70–99)

## 2014-11-26 NOTE — Progress Notes (Signed)
Inpatient Diabetes Program Recommendations  AACE/ADA: New Consensus Statement on Inpatient Glycemic Control (2013)  Target Ranges:  Prepandial:   less than 140 mg/dL      Peak postprandial:   less than 180 mg/dL (1-2 hours)      Critically ill patients:  140 - 180 mg/dL   Glucose tends to have a 'roller-coaster" pattern. Lantus appears to be sufficient, however correction alone does not seem to keep patient in control until high doses are needed to correct. Would recommend trying to prevent the high glucose following meals and use the meal coverage and decrease correction to moderate tidwc and HS scale.  Inpatient Diabetes Program Recommendations Insulin - Basal: xxxxxxxxxxx Insulin - Meal Coverage: Pt takes minimally 3 units meal coverage tidwc at home. Please add meal coverage here as well.  Thank you, Rosita Kea, RN, CNS, Diabetes Coordinator [phone])

## 2014-11-26 NOTE — Progress Notes (Signed)
Speech Language Pathology Daily Session Note  Patient Details  Name: [name] MRN: [id number] Date of Birth: May 17, 1946  Today's Date: 11/26/2014 SLP Individual Time: 1100-1200 SLP Individual Time Calculation (min): 60 min  Short Term Goals: Week 1: SLP Short Term Goal 1 (Week 1): Patient will consume trial tray of Dys.2 textures and thin liquids with Supervision cues to utilize safe swallow strategies to minimize overt s/s of aspiration.  SLP Short Term Goal 2 (Week 1): Patient will perform pharyngeal strengthening exercises with Min verbal cues  Skilled Therapeutic Interventions: Skilled treatment session focused on dysphagia goals. Upon arrival, patient was sitting upright in the wheelchair and agreeable to participate in therapy with family present. Patient's daughter and wife were educated on patient's current swallowing function and goals of skilled SLP intervention, both verbalized understanding. Patient consumed lunch meal of Dys. 2 textures with thin liquids via straw without overt s/s of aspiration and demonstrated efficient mastication with minimal lingual residue. Patient also required tray set-up with intermittent verbal cues for use of small bites/sips. Recommend patient continue Dys. 2 textures with thin liquids with full supervision from staff. Patient left in wheelchair with all needs within reach and family present. Continue with current plan of care.   FIM:  Comprehension Comprehension Mode: Auditory Comprehension: 5-Follows basic conversation/direction: With extra time/assistive device Expression Expression Mode: Verbal Expression: 5-Expresses basic 90% of the time/requires cueing < 10% of the time. Social Interaction Social Interaction: 5-Interacts appropriately 90% of the time - Needs monitoring or encouragement for participation or interaction. Problem Solving Problem Solving: 5-Solves basic 90% of the time/requires cueing < 10% of the time Memory Memory:  4-Recognizes or recalls 75 - 89% of the time/requires cueing 10 - 24% of the time FIM - Eating Eating Activity: 5: Supervision/cues;5: Set-up assist for open containers  Pain Pain Assessment Pain Assessment: No/denies pain  Therapy/Group: Individual Therapy  [name] 11/26/2014, 12:45 PM

## 2014-11-26 NOTE — Progress Notes (Signed)
Occupational Therapy Session Note  Patient Details  Name: NEGAN GRUDZIEN MRN: [id number] Date of Birth: 1946/01/11  Today's Date: 11/26/2014 OT Individual Time: 1300-1400 OT Individual Time Calculation (min): 60 min    Short Term Goals: Week 1:  OT Short Term Goal 1 (Week 1): STG=LTG due to short ELOS  Skilled Therapeutic Interventions/Progress Updates: ADL-retraining with focus on improved transfers, toileting, dynamic sitting/standing balance, and adapted dressing (using sock aid).    Pt received seated in w/c awaiting therapist for planned activity.   With min guard assist, pt ambulated from w/c to [facility] over toilet and transferred with only steadying assist and min vc to direct hand placement d/t visual impairment.   Pt voided BM and attempted hygiene with 70% thoroughness.    Pt ambulated to shower with vc and hand guidance to locate grab bars while completed SPT to bench with only steadying assist.   Pt performed bathing and dried on bench after shower.   Pt completed transfer from bench to w/c and w/c to bed to allow total assist with replacing C-collar pads, while maintaining strict neck movement restrictions.   Pt dressed at edge of bed with min assist to lace pants and was then instructed on use of sock aid to don socks.   Pt required min assist to don left sock using sock aid d/t visual impairment.    Pt completed final transfer from edge of bed to recliner using RW, with all needs placed within reach.     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

## 2014-11-26 NOTE — Plan of Care (Signed)
Problem: SCI BOWEL ELIMINATION Goal: RH STG MANAGE BOWEL WITH ASSISTANCE STG Manage Bowel with min.Assistance.  Outcome: Progressing  Problem: RH SKIN INTEGRITY Goal: RH STG SKIN FREE OF INFECTION/BREAKDOWN With mod. assist  Outcome: Progressing Goal: RH STG MAINTAIN SKIN INTEGRITY WITH ASSISTANCE STG Maintain Skin Integrity With min. Assistance.  Outcome: Progressing

## 2014-11-26 NOTE — Progress Notes (Addendum)
[facility] PHYSICAL MEDICINE & REHABILITATION     PROGRESS NOTE    Subjective/Complaints: "i need my teeth!" Pain controlled. No other complaints  Objective: Vital Signs: Blood pressure 151/60, pulse 81, temperature 98.2 F (36.8 C), temperature source Oral, resp. rate 18, height 5' (1.524 m), weight 100.3 kg (221 lb 1.9 oz), SpO2 97 %. No results found. No results for input(s): WBC, HGB, HCT, PLT in the last 72 hours. No results for input(s): NA, K, CL, GLUCOSE, BUN, CREATININE, CALCIUM in the last 72 hours.  Invalid input(s): CO CBG (last 3)   Recent Labs  11/25/14 1655 11/25/14 2049 11/26/14 0700  GLUCAP 110* 277* 143*    Wt Readings from Last 3 Encounters:  11/21/14 100.3 kg (221 lb 1.9 oz)  11/13/14 91.5 kg (201 lb 11.5 oz)  10/02/14 88.814 kg (195 lb 12.8 oz)    Physical Exam:   Constitutional: He is oriented to person, place, and time. He appears well-developed and well-nourished. Verbose HENT:  Head: Normocephalic and atraumatic.  Eyes: Conjunctivae are normal. Pupils are equal, round, and reactive to light.  Neck:  Immobilized in collar. Incision clean, dry and intact. Persistent bruising and edema about neck/op site Cardiovascular: Normal rate and regular rhythm.  No murmur heard. Respiratory: Effort normal and breath sounds normal.  GI: Distended and tympanic (ascites?). Diastasis recti evident. Bowel sounds are normal. Abdomen non-tender Musculoskeletal: He exhibits no edema or tenderness.  Neurological: He is alert and oriented to person, place, and time.    dysarthria  . Able to follow commands without difficulty. Sensory deficits BLE and bilateral upper exts and trunk---1+/2. UES grossly 5/5 proximal to distal. LE's 4/5 HF, KE to 5/5 distally at ankles. still impulsive and safety judgement impaired. Impaired balance.  Skin: Skin is warm and dry. Ecchymotic areas on BUE. BLE with excoriated areas.  Psychiatric: He has a normal mood and affect.  His behavior is normal Assessment/Plan: 1. 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 Physiatrist is providing close team supervision and 24 hour management of active medical problems listed below. Physiatrist and rehab team continue to assess barriers to discharge/monitor patient progress toward functional and medical goals. FIM: FIM - Bathing Bathing Steps Patient Completed: Chest, Right Arm, Left Arm, Abdomen, Front perineal area, Buttocks, Right upper leg, Left upper leg Bathing: 4: Min-Patient completes 8-9 30f 10 parts or 75+ percent  FIM - Upper Body Dressing/Undressing Upper body dressing/undressing steps patient completed: Thread/unthread right sleeve of pullover shirt/dresss, Thread/unthread left sleeve of pullover shirt/dress, Put head through opening of pull over shirt/dress, Pull shirt over trunk Upper body dressing/undressing: 5: Set-up assist to: Obtain clothing/put away FIM - Lower Body Dressing/Undressing Lower body dressing/undressing steps patient completed: Thread/unthread right underwear leg, Thread/unthread left underwear leg, Pull underwear up/down, Thread/unthread right pants leg, Thread/unthread left pants leg, Pull pants up/down Lower body dressing/undressing: 4: Min-Patient completed 75 plus % of tasks  FIM - Toileting Toileting steps completed by patient: Performs perineal hygiene, Adjust clothing prior to toileting Toileting Assistive Devices: Grab bar or rail for support Toileting: 4: Steadying assist  FIM - Radio producer Devices: Engineer, civil (consulting), Insurance account manager Transfers: 5-To toilet/BSC: Supervision (verbal cues/safety issues), 5-From toilet/BSC: Supervision (verbal cues/safety issues)  FIM - Control and instrumentation engineer Devices: Arm rests Bed/Chair  Transfer: 4: Bed > Chair or W/C: Min A (steadying Pt. > 75%), 4: Chair  or W/C > Bed: Min A (steadying Pt. > 75%)  FIM - Locomotion: Wheelchair Locomotion: Wheelchair: 5: Travels 150 ft or more: maneuvers on rugs and over door sills with supervision, cueing or coaxing FIM - Locomotion: Ambulation Locomotion: Ambulation Assistive Devices: Administrator Ambulation/Gait Assistance: 3: Mod assist Locomotion: Ambulation: 3: Travels 150 ft or more with moderate assistance (Pt: 50 - 74%)  Comprehension Comprehension Mode: Auditory Comprehension: 5-Follows basic conversation/direction: With no assist  Expression Expression Mode: Verbal Expression: 5-Expresses basic 90% of the time/requires cueing < 10% of the time.  Social Interaction Social Interaction: 4-Interacts appropriately 75 - 89% of the time - Needs redirection for appropriate language or to initiate interaction.  Problem Solving Problem Solving: 5-Solves basic 90% of the time/requires cueing < 10% of the time  Memory Memory: 4-Recognizes or recalls 75 - 89% of the time/requires cueing 10 - 24% of the time Medical Problem List and Plan: 1. Functional deficits secondary to Cervical spondylosis with myelopathy s/p decompressive lam.  2. DVT Prophylaxis/Anticoagulation: Mechanical: Sequential compression devices, below knee Bilateral lower extremities 3. Pain Management: prn ultram and robaxin effective.   -limiting due to neuro-sedating se 4. Mood: Will continue zoloft daily. LCSW to follow for evaluation and support.  5. Neuropsych: This patient is capable of making decisions on his own behalf. 6. Skin/Wound Care: Routine pressure relief measures.  7. Fluids/Electrolytes/Nutrition: Monitor I/O.  8. DM type 2 with retinopathy: sugars uncontrolled presently. Increase lantus to 80 units qhs which is his home dose with SSI as needed for elevated BS.  9. NASH: Continue lactulose and Xifaxan bid. Ammonia level within normal  limits  -On questran to help with diarrhea.  10. COPD: Respiratory status stable.  11. HTN: will monitor tid. Continue Corgard, Lasix and Norvasc.  12. Glaucoma: Continue Timoptic and Alphagan bid.  13. Thrombocytopenia: platelets running around 50k  14. Chronic anemia: hgb 10.4--->9.9 15. Klebsiella UTI--cipro  LOS (Days) 6 A FACE TO FACE EVALUATION WAS PERFORMED  SWARTZ,ZACHARY T 11/26/2014 8:22 AM

## 2014-11-26 NOTE — Progress Notes (Signed)
Physical Therapy Session Note  Patient Details  Name: [name] MRN: [id number] Date of Birth: 1946-11-23  Today's Date: 11/26/2014 PT Individual Time: 0905-1005 PT Individual Time Calculation (min): 60 min   Short Term Goals: Week 1:  PT Short Term Goal 1 (Week 1): STG=LTG due to ELOS  Skilled Therapeutic Interventions/Progress Updates:   Session focused on functional transfers including transfers in ADL apartment and simulated car transfer (sedan height) with S/steady A and cues for safe hand placement (not to pull up on RW), gait training through obstacle course for dynamic gait to simulate household environment with overall min A to navigate turns through cones and step over simulated threshold (like home entry) which was improved from when this therapist worked with pt on Fri, introduced and performed [place] HEP for functional strengthening using 2# ankle weights for LAQ, standing hip abduction, standing hamstring curls, mini squats, standing heel raises and seated toe raises x 10 reps each BLE, stair negotiation for community mobility training with steady A, gait from ADL apartment to pt room with RW and close S/steady A with cues for posture, and practiced bed mobility on bed in ADL apartment (mod I level but pt uses hospital bed at home due to "sleep disorder" per pt report). Pt's wife and daughter in room and discussed pt's current mobility level at this time and recommendation for 24/7 S with pt's gait/standing due to high fall risk. Also discussed DME recommended (pt already owns RW and will order w/c for longer distances in community or for use in the home). Family request to speak with CSW - this therapist let CSW know.   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 Locomotion : Ambulation Ambulation/Gait Assistance: 4: Min guard   See FIM for current functional status  Therapy/Group: Individual Therapy  [name] [facility] 11/26/2014, 10:19 AM

## 2014-11-26 NOTE — Progress Notes (Deleted)
Pt had another "episode" of tremors in upper body and trunk with the unresponsiveness blank stare for few moments with standing before sitting on toilet for bowel movement. Pt hit lft elbow on wall with episode and allevyn patch applied after cleansing elbow. No other injury noted. Dan [place] notified of episode and medications. New orders received. Wife and dtr at bedside and aware of events during PT.Margarito Liner

## 2014-11-26 NOTE — Progress Notes (Signed)
Recreational Therapy Session Note  Patient Details  Name: WAI LITT MRN: [id number] Date of Birth: 08/31/1946 Today's Date: 11/26/2014  Will monitor pt for community reintegration per team referral.  No further TR services implemented at this time. Dakota Stangl 11/26/2014, 11:56 AM

## 2014-11-27 ENCOUNTER — Inpatient Hospital Stay (HOSPITAL_COMMUNITY): Payer: Medicare Other

## 2014-11-27 ENCOUNTER — Inpatient Hospital Stay (HOSPITAL_COMMUNITY): Payer: Medicare Other | Admitting: Speech Pathology

## 2014-11-27 ENCOUNTER — Encounter (HOSPITAL_COMMUNITY): Payer: Medicare Other | Admitting: Occupational Therapy

## 2014-11-27 LAB — GLUCOSE, CAPILLARY
GLUCOSE-CAPILLARY: 316 mg/dL — AB (ref 70–99)
GLUCOSE-CAPILLARY: 79 mg/dL (ref 70–99)
GLUCOSE-CAPILLARY: 182 mg/dL — AB (ref 70–99)
Glucose-Capillary: 146 mg/dL — ABNORMAL HIGH (ref 70–99)

## 2014-11-27 MED ORDER — GLIMEPIRIDE 1 MG PO TABS
1.0000 mg | ORAL_TABLET | Freq: Every day | ORAL | Status: DC
  Administered 2014-11-27 – 2014-11-30 (×4): 1 mg via ORAL
  Filled 2014-11-27 (×5): qty 1

## 2014-11-27 NOTE — Progress Notes (Signed)
[facility] PHYSICAL MEDICINE & REHABILITATION     PROGRESS NOTE    Subjective/Complaints: Slept well. Happy to be on better diet.   Objective: Vital Signs: Blood pressure 139/66, pulse 88, temperature 99.1 F (37.3 C), temperature source Oral, resp. rate 20, height 5' (1.524 m), weight 100.3 kg (221 lb 1.9 oz), SpO2 95 %. No results found. No results for input(s): WBC, HGB, HCT, PLT in the last 72 hours. No results for input(s): NA, K, CL, GLUCOSE, BUN, CREATININE, CALCIUM in the last 72 hours.  Invalid input(s): CO CBG (last 3)   Recent Labs  11/26/14 1631 11/26/14 2030 11/27/14 0649  GLUCAP 266* 299* 146*    Wt Readings from Last 3 Encounters:  11/21/14 100.3 kg (221 lb 1.9 oz)  11/13/14 91.5 kg (201 lb 11.5 oz)  10/02/14 88.814 kg (195 lb 12.8 oz)    Physical Exam:   Constitutional: He is oriented to person, place, and time. He appears well-developed and well-nourished. Verbose HENT:  Head: Normocephalic and atraumatic.  Eyes: Conjunctivae are normal. Pupils are equal, round, and reactive to light.  Neck:  Immobilized in collar. Bruising better around neck Cardiovascular: Normal rate and regular rhythm.  No murmur heard. Respiratory: Effort normal and breath sounds normal.  GI: Distended and tympanic (ascites?). Diastasis recti evident. Bowel sounds are normal. Abdomen non-tender Musculoskeletal: He exhibits no edema or tenderness.  Neurological: He is alert and oriented to person, place, and time.    dysarthria  . Able to follow commands without difficulty. Sensory deficits BLE and bilateral upper exts and trunk---1+/2. UES grossly 5/5 proximal to distal. LE's 4/5 HF, KE to 5/5 distally at ankles. still impulsive and safety judgement impaired. Impaired balance. impulsive Skin: Skin is warm and dry. Ecchymotic areas on BUE. BLE with excoriated areas.  Psychiatric: He has a normal mood and affect. His behavior is normal Assessment/Plan: 1. 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 Physiatrist is providing close team supervision and 24 hour management of active medical problems listed below. Physiatrist and rehab team continue to assess barriers to discharge/monitor patient progress toward functional and medical goals. FIM: FIM - Bathing Bathing Steps Patient Completed: Chest, Right Arm, Left Arm, Abdomen, Front perineal area, Buttocks, Right upper leg, Left upper leg Bathing: 4: Min-Patient completes 8-9 19f 10 parts or 75+ percent  FIM - Upper Body Dressing/Undressing Upper body dressing/undressing steps patient completed: Thread/unthread right sleeve of pullover shirt/dresss, Thread/unthread left sleeve of pullover shirt/dress, Put head through opening of pull over shirt/dress, Pull shirt over trunk Upper body dressing/undressing: 5: Set-up assist to: Obtain clothing/put away FIM - Lower Body Dressing/Undressing Lower body dressing/undressing steps patient completed: Pull pants up/down, Fasten/unfasten pants, Don/Doff left sock Lower body dressing/undressing: 2: Max-Patient completed 25-49% of tasks  FIM - Toileting Toileting steps completed by patient: Adjust clothing prior to toileting, Performs perineal hygiene, Adjust clothing after toileting Toileting Assistive Devices: Grab bar or rail for support Toileting: 3: Mod-Patient completed 2 of 3 steps  FIM - Radio producer Devices: Engineer, civil (consulting), Insurance account manager Transfers: 4-To toilet/BSC: Min A (steadying Pt. > 75%), 4-From toilet/BSC: Min A (steadying Pt. > 75%)  FIM - Control and instrumentation engineer Devices: Bed rails, Adult nurse Transfer: 5: Supine > Sit: Supervision (verbal cues/safety issues), 5: Sit > Supine: Supervision (verbal cues/safety issues), 4: Bed > Chair or  W/C: Min A (steadying  Pt. > 75%), 4: Chair or W/C > Bed: Min A (steadying Pt. > 75%)  FIM - Locomotion: Wheelchair Locomotion: Wheelchair: 5: Travels 150 ft or more: maneuvers on rugs and over door sills with supervision, cueing or coaxing FIM - Locomotion: Ambulation Locomotion: Ambulation Assistive Devices: Administrator Ambulation/Gait Assistance: 4: Min guard Locomotion: Ambulation: 2: Travels 50 - 149 ft with minimal assistance (Pt.>75%)  Comprehension Comprehension Mode: Auditory Comprehension: 5-Follows basic conversation/direction: With no assist  Expression Expression Mode: Verbal Expression: 5-Expresses basic 90% of the time/requires cueing < 10% of the time.  Social Interaction Social Interaction: 5-Interacts appropriately 90% of the time - Needs monitoring or encouragement for participation or interaction.  Problem Solving Problem Solving: 5-Solves basic 90% of the time/requires cueing < 10% of the time  Memory Memory: 5-Recognizes or recalls 90% of the time/requires cueing < 10% of the time Medical Problem List and Plan: 1. Functional deficits secondary to Cervical spondylosis with myelopathy s/p decompressive lam.  2. DVT Prophylaxis/Anticoagulation: Mechanical: Sequential compression devices, below knee Bilateral lower extremities 3. Pain Management: prn ultram and robaxin effective.   -limiting due to neuro-sedating se 4. Mood: Will continue zoloft daily. LCSW to follow for evaluation and support.  5. Neuropsych: This patient is capable of making decisions on his own behalf. 6. Skin/Wound Care: Routine pressure relief measures.  7. Fluids/Electrolytes/Nutrition: Monitor I/O.  8. DM type 2 with retinopathy: sugars uncontrolled presently. Increase lantus to 80 units qhs which is his home dose with SSI as needed for elevated BS.   -add am amaryl  -rx UTI 9. NASH: Continue lactulose and Xifaxan bid. Ammonia level within normal limits  -On questran to  help with diarrhea.  10. COPD: Respiratory status stable.  11. HTN: will monitor tid. Continue Corgard, Lasix and Norvasc.  12. Glaucoma: Continue Timoptic and Alphagan bid.  13. Thrombocytopenia: platelets running around 50k  14. Chronic anemia: hgb 10.4--->9.9 15. Klebsiella UTI--cipro for 5 days  LOS (Days) 7 A FACE TO FACE EVALUATION WAS PERFORMED  Kili Gracy T 11/27/2014 7:44 AM

## 2014-11-27 NOTE — Plan of Care (Signed)
Problem: SCI BOWEL ELIMINATION Goal: RH STG MANAGE BOWEL WITH ASSISTANCE STG Manage Bowel with min.Assistance.  Outcome: Progressing  Problem: RH SKIN INTEGRITY Goal: RH STG SKIN FREE OF INFECTION/BREAKDOWN With mod. assist  Outcome: Progressing Goal: RH STG MAINTAIN SKIN INTEGRITY WITH ASSISTANCE STG Maintain Skin Integrity With min. Assistance.  Outcome: Progressing

## 2014-11-27 NOTE — Progress Notes (Signed)
Occupational Therapy Session Note  Patient Details  Name: [name] MRN: [id number] Date of Birth: [birth year]  Today's Date: 11/27/2014 OT Individual Time: 0930-1030 OT Individual Time Calculation (min): 60 min   Short Term Goals: Week 1:  OT Short Term Goal 1 (Week 1): STG=LTG due to short ELOS  Skilled Therapeutic Interventions/Progress Updates:  Patient received supine in bed asleep, easy to awake and arouse. Patient with no complaints of pain. Patient engaged in bed mobility, stood with RW, and ambulated into bathroom with steady assist and max verbal cues for direction secondary to low vision. Patient completed UB/LB bathing in sit<>stand position from tub transfer bench in shower stall with supervision. Patient then dried and ambulated > bed for therapist to change out cervical collar pads while patient laying in supine position. Patient completed UB/LB dressing in sit<>stand position from EOB. From here, patient ambulated > therapy gym for therapeutic exercise focusing on BLE strengthening and overall endurance. Patient completed 10 minutes of exercise on NuStep machine on level 5. Patient ambulated back to room and left seated in w/c with all needs within reach. Therapist notified RN that there seems to be no need to don quick release belt at this time.   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

## 2014-11-27 NOTE — Progress Notes (Signed)
Speech Language Pathology Daily Session Notes  Patient Details  Name: [name] MRN: [id number] Date of Birth: 05-Apr-1946  Today's Date: 11/27/2014  Session 1: SLP Individual Time: 6468-0321 SLP Individual Time Calculation (min): 30 min   Session 2: SLP Individual Time: 1130-1200 SLP Individual Time Calculation (min): 30 min   Short Term Goals: Week 1: SLP Short Term Goal 1 (Week 1): Patient will consume trial tray of Dys.2 textures and thin liquids with Supervision cues to utilize safe swallow strategies to minimize overt s/s of aspiration.  SLP Short Term Goal 2 (Week 1): Patient will perform pharyngeal strengthening exercises with Min verbal cues  Skilled Therapeutic Interventions:  Session 1: Skilled treatment session focused on dysphagia goals. Upon arrival, patient was sitting EOB consuming breakfast meal of Dys. 2 textures with thin liquids without overt s/s of aspiration and was Mod I for use of small bites/sips. SLP facilitated session by providing trials of Dys. 3 textures, patient demonstrated efficient mastication without overt s/s of aspiration, therefore, recommend trial tray of Dys. 3 textures prior to upgrade. Patient repositioned supine in bed with bed alarm on and all needs within reach. Continue with current plan of care.    Session 2: Skilled treatment session focused on dysphagia goals. Upon arrival, patient was sitting upright in the wheelchair and was agreeable to participate in treatment session. SLP facilitated session by providing Min A multimodal cues for use of small bites/sips with trial tray of Dys.3 textures during lunch. Patient demonstrated increased utilization of multiple swallows and liquid washes throughout the meal with intermittent cough X 3. Patient also reported increased difficulty with upgraded textures, therefore, recommend patient continue current of Dys. 2 textures with thin liquids. Patient left in wheelchair with all needs within reach.  Continue with current plan of care.   FIM:  Comprehension Comprehension Mode: Auditory Comprehension: 5-Follows basic conversation/direction: With no assist Expression Expression Mode: Verbal Expression: 5-Expresses basic 90% of the time/requires cueing < 10% of the time. Social Interaction Social Interaction: 5-Interacts appropriately 90% of the time - Needs monitoring or encouragement for participation or interaction. Problem Solving Problem Solving: 5-Solves basic 90% of the time/requires cueing < 10% of the time Memory Memory: 5-Recognizes or recalls 90% of the time/requires cueing < 10% of the time FIM - Eating Eating Activity: 5: Supervision/cues  Pain Pain Assessment Pain Assessment: No/denies pain  Therapy/Group: Individual Therapy  Zandria Woldt 11/27/2014, 8:35 AM

## 2014-11-27 NOTE — Progress Notes (Signed)
Physical Therapy Session Note  Patient Details  Name: [name] MRN: [id number] Date of Birth: March 17, 1946  Today's Date: 11/27/2014 PT Individual Time: 1445-1515 PT Individual Time Calculation (min): 30 min   Short Term Goals: Week 1:  PT Short Term Goal 1 (Week 1): STG=LTG due to ELOS  Skilled Therapeutic Interventions/Progress Updates:   Session focused on functional gait with RW to and from therapy gym with steady A intermittently especially for turns otherwise close S, dynamic standing balance and balance reaction training on compliant surface for functional reaching task with 1 UE support with steady A and min A when LOB posteriorly occurs, and sidestepping and obstacle negotiation through cones with steady A and cues for staying inside RW. Pt continues to require cueing for safety with RW and with mobility.   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 Locomotion : Ambulation Ambulation/Gait Assistance: 4: Min guard    See FIM for current functional status  Therapy/Group: Individual Therapy  [name] [facility] 11/27/2014, 3:21 PM

## 2014-11-27 NOTE — Progress Notes (Signed)
Physical Therapy Session Note  Patient Details  Name: [name] MRN: [id number] Date of Birth: 1946/09/26  Today's Date: 11/27/2014 PT Individual Time: 1600-1630 PT Individual Time Calculation (min): 30 min   Short Term Goals: Week 1:  PT Short Term Goal 1 (Week 1): STG=LTG due to ELOS  Skilled Therapeutic Interventions/Progress Updates:    Pt received seated in w/c, agreeable to participate in therapy. Session focused on functional ambulation, [place] exercises for standing balance. Pt ambulated 200'x2 to/from rehab gym w/ RW and MinGuard/close S with pt able to find route to gym and to room with no cueing. In rehab gym pt completed x10 [place] ex's w/ 2# ankle weights seated LAQ, standing hip abduction, standing HS curls, heel/toe raises, mini-squats.    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

## 2014-11-27 NOTE — Progress Notes (Signed)
Inpatient Diabetes Program Recommendations  AACE/ADA: New Consensus Statement on Inpatient Glycemic Control (2013)  Target Ranges:  Prepandial:   less than 140 mg/dL      Peak postprandial:   less than 180 mg/dL (1-2 hours)      Critically ill patients:  140 - 180 mg/dL   Inpatient Diabetes Program Recommendations Insulin - Basal: xxxxxxxxxxx Insulin - Meal Coverage: Pt takes minimally 3 units meal coverage tidwc at home. Please add meal coverage here as well.   Noted Amaryl 1 mg added to regimen. Usual starting dose is 4 mg per day.  If want to use an oral sulfonylurea, would recommend starting with at least 2 mg, however at this point, Amaryl may not stimulate enough insulin to cover meals. Again, please add meal coverage of 3-4 units.   Thank you, Rosita Kea, RN, CNS, Diabetes Coordinator [phone])

## 2014-11-28 ENCOUNTER — Inpatient Hospital Stay (HOSPITAL_COMMUNITY): Payer: Medicare Other | Admitting: Rehabilitation

## 2014-11-28 ENCOUNTER — Ambulatory Visit (HOSPITAL_COMMUNITY): Payer: Medicare Other | Admitting: Speech Pathology

## 2014-11-28 ENCOUNTER — Encounter (HOSPITAL_COMMUNITY): Payer: Medicare Other | Admitting: Occupational Therapy

## 2014-11-28 LAB — GLUCOSE, CAPILLARY
GLUCOSE-CAPILLARY: 49 mg/dL — AB (ref 70–99)
Glucose-Capillary: 108 mg/dL — ABNORMAL HIGH (ref 70–99)
Glucose-Capillary: 137 mg/dL — ABNORMAL HIGH (ref 70–99)
Glucose-Capillary: 84 mg/dL (ref 70–99)
Glucose-Capillary: 220 mg/dL — ABNORMAL HIGH (ref 70–99)

## 2014-11-28 MED ORDER — INSULIN GLARGINE 100 UNIT/ML ~~LOC~~ SOLN
70.0000 [IU] | Freq: Every day | SUBCUTANEOUS | Status: DC
  Administered 2014-11-28 – 2014-11-29 (×2): 70 [IU] via SUBCUTANEOUS
  Filled 2014-11-28 (×3): qty 0.7

## 2014-11-28 NOTE — Anesthesia Postprocedure Evaluation (Signed)
  Anesthesia Post-op Note  Patient: [name]  Procedure(s) Performed: Procedure(s):  Cervical three-four, Cervical four-five, Cervical five-six, Cervical six- seven anterior cervical decompression with fusion and interbody prosthesis plating and bonegraft (N/A)  Patient Location: PACU  Anesthesia Type:General  Level of Consciousness: awake and alert   Airway and Oxygen Therapy: Patient Spontanous Breathing  Post-op Pain: mild  Post-op Assessment: Post-op Vital signs reviewed  Post-op Vital Signs: stable  Last Vitals:  Filed Vitals:   11/20/14 1441  BP: 143/61  Pulse: 93  Temp: 37.6 C  Resp: 16    Complications: No apparent anesthesia complications

## 2014-11-28 NOTE — Progress Notes (Signed)
Hypoglycemic Event  CBG:  49 at 0647  Treatment: 15 GM carbohydrate snack  Given 25 gm snack  Symptoms: None  Follow-up CBG: Time:0737 CBG Result:108  Possible Reasons for Event: Unknown  Comments/MD notified: pt given snack     [name]  Remember to initiate Hypoglycemia Order Set & complete

## 2014-11-28 NOTE — Plan of Care (Signed)
Problem: RH SKIN INTEGRITY Goal: RH STG SKIN FREE OF INFECTION/BREAKDOWN With mod. assist  Outcome: Progressing Goal: RH STG MAINTAIN SKIN INTEGRITY WITH ASSISTANCE STG Maintain Skin Integrity With min. Assistance.  Outcome: Progressing

## 2014-11-28 NOTE — Progress Notes (Signed)
Hypoglycemic Event  CBG: 49  Treatment: 15 GM carbohydrate snack  Symptoms: Sweaty per patient  Follow-up CBG: TVGV:0254 CBG Result:108  Possible Reasons for Event: Unknown  Comments/MD notified:Noted in Luke Plummer, MD note of low BS    [place], [name]  Remember to initiate Hypoglycemia Order Set & complete

## 2014-11-28 NOTE — Progress Notes (Signed)
Physical Therapy Session Note  Patient Details  Name: [name] MRN: [id number] Date of Birth: January 23, 1946  Today's Date: 11/28/2014 PT Individual Time: 5456-2563 PT Individual Time Calculation (min): 60 min   Short Term Goals: Week 1:  PT Short Term Goal 1 (Week 1): STG=LTG due to ELOS  Skilled Therapeutic Interventions/Progress Updates:   Pt received sitting in w/c in room, agreeable to therapy session, but requesting to use restroom prior to session.  Pt ambulated to/from restroom with min/guard A and cues for navigation to toilet with RW.  Pt able to adjust clothing prior to and following toileting and voided both urine and bowel successfully, however needed assist for peri care.  Pt then ambulated to sink to wash/dry hands with cues for locating soap and paper towels.  Pt then ambulated to/from therapy gym x 150' x 2 reps with RW at min/guard to close S level.  Pt with good ability to recall where gym and room were.  Once in therapy gym, skilled session focused on gait through obstacles courses for improved quality with RW and balance strategies on compliant surfaces.  Performed gait x 2 reps 25' each over poles and wedges and around cones.  Did very well requiring only steadying assist throughout.  Progressed to performing dynamic standing on foam pad while tossing horse shoes.  Pt initially requires mod/max assist to prevent posterior LOB, however with cues for ankle and hip strategies, pt able to demonstrate improved balance during session.  Pt progressed to min/mod A to maintain balance while tossing horse shoes.  Then had pt retreive three horse shoes from floor to work on balance, UE grasp and BLE quad and glute strength.  Pt also requires cues to breath throughout bending exercise.  Progressed to performing more reaching task on foam pad with clothes pins, utilizing LUE, as this seems to be weaker than RUE.  Requires only min A for this task with slight guidance to pole due to decreased  vision.  Ended session with gait to ADL apt in order to work on gait on carpet, tight spaces and bed mobility in home setting, despite having hospital bed at home.  Performed at S level with cues for log rolling when getting out of bed for ease and to ensure cervical precautions.  Pt then ambulated back to room and left in recliner with all needs in reach.  Discussed leaving quick release belt off of pt as it was marked out on safety plan but he was wearing it when PT arrived.  Pt able to state that he needed someone with him when he needed to get up.  Nurse tech notified of this and also that he wanted something to drink.    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

## 2014-11-28 NOTE — Progress Notes (Signed)
Speech Language Pathology Daily Session Note  Patient Details  Name: [name] MRN: [id number] Date of Birth: March 09, 1946  Today's Date: 11/28/2014 SLP Individual Time: 5573-2202 SLP Individual Time Calculation (min): 60 min  Short Term Goals: Week 1: SLP Short Term Goal 1 (Week 1): Patient will consume trial tray of Dys.2 textures and thin liquids with Supervision cues to utilize safe swallow strategies to minimize overt s/s of aspiration.  SLP Short Term Goal 2 (Week 1): Patient will perform pharyngeal strengthening exercises with Min verbal cues  Skilled Therapeutic Interventions:  Pt was seen for skilled ST targeting dysphagia goals.  Upon arrival, pt was reclined in bed, awake, alert, and agreeable to participate in [place].  Pt transferred to wheelchair with min assist to maximize alertness for swallowing safety during his meal.  Pt recalled his recommended swallowing precautions with supervision question cues and utilized them over the course of his breakfast to minimize overt s/s of aspiration with supervision/set up assist.  Pt exhibited immediate cough x1 following mixed consistencies which SLP suspects was due to isolated instance of poor portion control.  Pt aware of error and self corrected with consecutive bites.  Continue per current plan of care.     FIM:  Comprehension Comprehension Mode: Auditory Comprehension: 5-Follows basic conversation/direction: With extra time/assistive device Expression Expression Mode: Verbal Expression: 5-Expresses basic needs/ideas: With extra time/assistive device Social Interaction Social Interaction: 6-Interacts appropriately with others with medication or extra time (anti-anxiety, antidepressant). Problem Solving Problem Solving: 5-Solves basic 90% of the time/requires cueing < 10% of the time Memory Memory: 5-Recognizes or recalls 90% of the time/requires cueing < 10% of the time FIM - Eating Eating Activity: 5: Supervision/cues;5:  Set-up assist for open containers  Pain Pain Assessment Pain Assessment: No/denies pain  Therapy/Group: Individual Therapy  Ketzia Guzek, Selinda Orion 11/28/2014, 8:54 AM

## 2014-11-28 NOTE — Addendum Note (Signed)
Addendum  created 11/28/14 1208 by Rica Koyanagi, MD   Modules edited: Notes Section   Notes Section:  File: [id number]

## 2014-11-28 NOTE — Patient Care Conference (Signed)
Inpatient RehabilitationTeam Conference and Plan of Care Update Date: 11/27/2014   Time: 2:00 PM    Patient Name: [name]      Medical Record Number: [id number]  Date of Birth: 09-Feb-1946 Sex: Male         Room/Bed: 4M10C/4M10C-01 Payor Info: Payor: MEDICARE / Plan: MEDICARE PART A AND B / Product Type: *No Product type* /    Admitting Diagnosis: cerv decompression  Admit Date/Time:  11/20/2014  4:47 PM Admission Comments: No comment available   Primary Diagnosis:  Cervical spondylosis with myelopathy Principal Problem: Cervical spondylosis with myelopathy  Patient Active Problem List   Diagnosis Date Noted  . UTI (lower urinary tract infection) 11/23/2014  . Cognitive deficits 11/23/2014  . Thrombocytopenia 11/23/2014  . acute on chronic anemia 11/23/2014  . NASH (nonalcoholic steatohepatitis) 11/21/2014  . Stenosis of cervical spine region 11/20/2014  . Type 2 diabetes mellitus, uncontrolled, with retinopathy 11/20/2014  . History of COPD 11/20/2014  . Diabetes mellitus with diabetic retinopathy   . Acute respiratory acidosis   . Encounter for intubation   . Encounter for orogastric (OG) tube placement   . Surgery, other elective   . Cervical spondylosis with myelopathy 11/13/2014    Expected Discharge Date: Expected Discharge Date: 11/30/14  Team Members Present: Physician leading conference: Dr. Alger Simons Social Worker Present: Alfonse Alpers, LCSW Nurse Present: Heather Roberts, RN PT Present: [name], Lorriane Shire, PT OT Present: Salome Spotted, OT;Patricia Lissa Hoard, OT SLP Present: Weston Anna, SLP Other (Discipline and Name): Danne Baxter, RN [facility]) PPS Coordinator present : Daiva Nakayama, RN, CRRN     Current Status/Progress Goal Weekly Team Focus  Medical   cervical myelopathy, dysphagia. poor safety awareness and judgement  improve balance and safety  pain mgt, diet upgrade   Bowel/Bladder   cont of B+B         Swallow/Nutrition/  Hydration   Dys. 2 textures with thin liquids, Intermittent supervision  Mod I with least restrictive diet  tolerance of current diet, family education    ADL's   overall supervision>min assist  overall supervision  ADL retraining, functional mobility, functional transfers, overall functional strengthening & endurance, overall activity tolerance/endurance, family education, d/c planning   Mobility   min A overall  S overall  gait, dynamic standing balance, endurance, functional strengthening, stairs, fam ed   Communication             Safety/Cognition/ Behavioral Observations            Pain   n/a         Skin   incision healing without difficulty  maintain skin integrity and healing incision area  monitor incision q shift    Rehab Goals Patient on target to meet rehab goals: Yes Rehab Goals Revised: None *See Care Plan and progress notes for long and short-term goals.  Barriers to Discharge: safety awareness    Possible Resolutions to Barriers:  supervision goals    Discharge Planning/Teaching Needs:  Pt to return to his home with his dtr, wife, and home health for assistance, as needed.  Hospital bed and wheelchair will be ordered.  Pt uses pen at home and feels comfortable with administration.  Wife and dtr to receive family education on 11-29-14.   Team Discussion:  Pt with cervical decompression on 11-13-14.  He has been impulsive at times making safety a concern, but that seems to be improving.  Pt is pleased that diet was upgraded.  Pt still with  high blood sugars and is on a lot of insulin.  Pt is trying to modify his carbs himself on his food tray.  Pt's goals are supervision level and he is progressing to that with expectation of goals to be met by 11-30-14.  PT and OT would like one session each for family education on 11-29-14 prior to d/c.  Revisions to Treatment Plan:  Nursing to complete a trial with pt without the quick release belt.   Continued Need for Acute  Rehabilitation Level of Care: The patient requires daily medical management by a physician with specialized training in physical medicine and rehabilitation for the following conditions: Daily direction of a multidisciplinary physical rehabilitation program to ensure safe treatment while eliciting the highest outcome that is of practical value to the patient.: Yes Daily medical management of patient stability for increased activity during participation in an intensive rehabilitation regime.: Yes Daily analysis of laboratory values and/or radiology reports with any subsequent need for medication adjustment of medical intervention for : Neurological problems;Post surgical problems  [name], [name] 11/28/2014, 11:45 AM

## 2014-11-28 NOTE — Progress Notes (Signed)
Social Work Patient ID: [name], male   DOB: [birth year], 68 y.o.   MRN: [id number]   [name] Hera Celaya, LCSW Social Worker Signed  Patient Care Conference 11/28/2014 11:45 AM    Expand All Collapse All   Inpatient RehabilitationTeam Conference and Plan of Care Update Date: 11/27/2014   Time: 2:00 PM     Patient Name: [name]       Medical Record Number: [id number]  Date of Birth: 11-Aug-1946 Sex: Male         Room/Bed: 4M10C/4M10C-01 Payor Info: Payor: MEDICARE / Plan: MEDICARE PART A AND B / Product Type: *No Product type* /    Admitting Diagnosis: cerv decompression  Admit Date/Time:  11/20/2014  4:47 PM Admission Comments: No comment available   Primary Diagnosis:  Cervical spondylosis with myelopathy Principal Problem: Cervical spondylosis with myelopathy    Patient Active Problem List     Diagnosis  Date Noted   .  UTI (lower urinary tract infection)  11/23/2014   .  Cognitive deficits  11/23/2014   .  Thrombocytopenia  11/23/2014   .  acute on chronic anemia  11/23/2014   .  NASH (nonalcoholic steatohepatitis)  11/21/2014   .  Stenosis of cervical spine region  11/20/2014   .  Type 2 diabetes mellitus, uncontrolled, with retinopathy  11/20/2014   .  History of COPD  11/20/2014   .  Diabetes mellitus with diabetic retinopathy     .  Acute respiratory acidosis     .  Encounter for intubation     .  Encounter for orogastric (OG) tube placement     .  Surgery, other elective     .  Cervical spondylosis with myelopathy  11/13/2014     Expected Discharge Date: Expected Discharge Date: 11/30/14  Team Members Present: Physician leading conference: Dr. Alger Simons Social Worker Present: Alfonse Alpers, LCSW Nurse Present: Heather Roberts, RN PT Present: [name], Lorriane Shire, PT OT Present: Salome Spotted, OT;Patricia Lissa Hoard, OT SLP Present: Weston Anna, SLP Other (Discipline and Name): Danne Baxter, RN [facility]) PPS Coordinator present : Daiva Nakayama, RN, CRRN        Current Status/Progress  Goal  Weekly Team Focus   Medical     cervical myelopathy, dysphagia. poor safety awareness and judgement  improve balance and safety  pain mgt, diet upgrade   Bowel/Bladder     cont of B+B         Swallow/Nutrition/ Hydration     Dys. 2 textures with thin liquids, Intermittent supervision  Mod I with least restrictive diet  tolerance of current diet, family education    ADL's     overall supervision>min assist  overall supervision  ADL retraining, functional mobility, functional transfers, overall functional strengthening & endurance, overall activity tolerance/endurance, family education, d/c planning   Mobility     min A overall  S overall  gait, dynamic standing balance, endurance, functional strengthening, stairs, fam ed   Communication               Safety/Cognition/ Behavioral Observations              Pain     n/a         Skin     incision healing without difficulty   maintain skin integrity and healing incision area  monitor incision q shift    Rehab Goals Patient on target to meet rehab goals: Yes Rehab Goals Revised: None *See Care  Plan and progress notes for long and short-term goals.    Barriers to Discharge:  safety awareness     Possible Resolutions to Barriers:   supervision goals     Discharge Planning/Teaching Needs:   Pt to return to his home with his dtr, wife, and home health for assistance, as needed.  Hospital bed and wheelchair will be ordered.   Pt uses pen at home and feels comfortable with administration.  Wife and dtr to receive family education on 11-29-14.    Team Discussion:    Pt with cervical decompression on 11-13-14.  He has been impulsive at times making safety a concern, but that seems to be improving.  Pt is pleased that diet was upgraded.  Pt still with high blood sugars and is on a lot of insulin.  Pt is trying to modify his carbs himself on his food tray.  Pt's goals are supervision  level and he is progressing to that with expectation of goals to be met by 11-30-14.  PT and OT would like one session each for family education on 11-29-14 prior to d/c.   Revisions to Treatment Plan:    Nursing to complete a trial with pt without the quick release belt.    Continued Need for Acute Rehabilitation Level of Care: The patient requires daily medical management by a physician with specialized training in physical medicine and rehabilitation for the following conditions: Daily direction of a multidisciplinary physical rehabilitation program to ensure safe treatment while eliciting the highest outcome that is of practical value to the patient.: Yes Daily medical management of patient stability for increased activity during participation in an intensive rehabilitation regime.: Yes Daily analysis of laboratory values and/or radiology reports with any subsequent need for medication adjustment of medical intervention for : Neurological problems;Post surgical problems  [name], [name] 11/28/2014, 11:45 AM

## 2014-11-28 NOTE — Progress Notes (Signed)
Occupational Therapy Session Note  Patient Details  Name: [name] MRN: [id number] Date of Birth: Jan 13, 1946  Today's Date: 11/28/2014 OT Individual Time: 1100-1200 OT Individual Time Calculation (min): 60 min    Short Term Goals: Week 1:  OT Short Term Goal 1 (Week 1): STG=LTG due to short ELOS  Skilled Therapeutic Interventions/Progress Updates:  Patient received seated in recliner with no complaints of pain. Patient stood with RW and ambulated from recliner to bathroom for elevated toilet seat transfer (supervision). Patient performed toileting hygiene and clothing management with supervision. Patient then ambulated and transferred onto tub transfer bench in shower unit. Patient completed UB/LB bathing in sit<>stand position with supervision. Patient then ambulated > bed for therapist to change aspen collar pads. Patient sat EOB for UB/LB dressing in sit<>stand position and then ambulated > recliner. Patient left seated in recliner with all needs left within reach.   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

## 2014-11-28 NOTE — Progress Notes (Signed)
Social Work Patient ID: [name], male   DOB: January 18, 1946, 68 y.o.   MRN: [id number]   CSW met with pt and spoke with pt's family via telephone to update them on team conference discussion.  Pt is doing well and is expected to meet supervision goals by 11-30-14.  Team would like to provide family with family education, so CSW scheduled this for 11-29-14 in the morning.  Pt will need a hospital bed and wheelchair and CSW ordered these items through [facility].  Pt/family are pleased to hear that pt would be d/c'd at the end of the week and they are looking forward to it.  CSW will continue to follow and assist with pt's d/c plan as needed.

## 2014-11-28 NOTE — Progress Notes (Signed)
[name] PHYSICAL MEDICINE & REHABILITATION     PROGRESS NOTE    Subjective/Complaints: Had a good night. Wonders when he's going home.   Objective: Vital Signs: Blood pressure 133/72, pulse 87, temperature 98.4 F (36.9 C), temperature source Oral, resp. rate 18, height 5' (1.524 m), weight 100.3 kg (221 lb 1.9 oz), SpO2 97 %. No results found. No results for input(s): WBC, HGB, HCT, PLT in the last 72 hours. No results for input(s): NA, K, CL, GLUCOSE, BUN, CREATININE, CALCIUM in the last 72 hours.  Invalid input(s): CO CBG (last 3)   Recent Labs  11/27/14 2210 11/28/14 0647 11/28/14 0737  GLUCAP 182* 49* 108*    Wt Readings from Last 3 Encounters:  11/21/14 100.3 kg (221 lb 1.9 oz)  11/13/14 91.5 kg (201 lb 11.5 oz)  10/02/14 88.814 kg (195 lb 12.8 oz)    Physical Exam:   Constitutional: He is oriented to person, place, and time. He appears well-developed and well-nourished. Verbose HENT:  Head: Normocephalic and atraumatic.  Eyes: Conjunctivae are normal. Pupils are equal, round, and reactive to light.  Neck:  Immobilized in collar. Bruising better around neck Cardiovascular: Normal rate and regular rhythm.  No murmur heard. Respiratory: Effort normal and breath sounds normal.  GI: Distended and tympanic (ascites?). Diastasis recti evident. Bowel sounds are normal. Abdomen non-tender Musculoskeletal: He exhibits no edema or tenderness.  Neurological: He is alert and oriented to person, place, and time.    dysarthria  . Able to follow commands without difficulty. Sensory deficits BLE and bilateral upper exts and trunk---1+/2. UES grossly 5/5 proximal to distal. LE's 4/5 HF, KE to 5/5 distally at ankles. still impulsive and safety judgement impaired. Impaired balance. impulsive Skin: Skin is warm and dry. Ecchymotic areas on BUE. BLE with excoriated areas.  Psychiatric: He has a normal mood and affect. His behavior is normal Assessment/Plan: 1.  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 Physiatrist is providing close team supervision and 24 hour management of active medical problems listed below. Physiatrist and rehab team continue to assess barriers to discharge/monitor patient progress toward functional and medical goals. FIM: FIM - Bathing Bathing Steps Patient Completed: Chest, Right Arm, Left Arm, Abdomen, Front perineal area, Buttocks, Right upper leg, Left upper leg Bathing: 4: Min-Patient completes 8-9 8f 10 parts or 75+ percent  FIM - Upper Body Dressing/Undressing Upper body dressing/undressing steps patient completed: Thread/unthread right sleeve of pullover shirt/dresss, Thread/unthread left sleeve of pullover shirt/dress, Put head through opening of pull over shirt/dress, Pull shirt over trunk Upper body dressing/undressing: 5: Set-up assist to: Obtain clothing/put away FIM - Lower Body Dressing/Undressing Lower body dressing/undressing steps patient completed: Thread/unthread right underwear leg, Thread/unthread left underwear leg, Pull underwear up/down, Thread/unthread right pants leg, Thread/unthread left pants leg, Pull pants up/down, Don/Doff right shoe, Don/Doff left shoe Lower body dressing/undressing: 5: Supervision: Safety issues/verbal cues  FIM - Toileting Toileting steps completed by patient: Adjust clothing prior to toileting, Performs perineal hygiene, Adjust clothing after toileting Toileting Assistive Devices: Grab bar or rail for support Toileting: 0: Activity did not occur  FIM - Radio producer Devices: Engineer, civil (consulting), Insurance account manager Transfers: 0-Activity did not occur  FIM - Control and instrumentation engineer Devices: Copy: 0: Activity did not occur  FIM - Locomotion:  Wheelchair Locomotion: Wheelchair: 0: Activity did not occur (pt ambulatory) FIM -  Locomotion: Ambulation Locomotion: Ambulation Assistive Devices: Administrator Ambulation/Gait Assistance: 4: Min guard, 5: Supervision Locomotion: Ambulation: 4: Travels 150 ft or more with minimal assistance (Pt.>75%)  Comprehension Comprehension Mode: Auditory Comprehension: 6-Follows complex conversation/direction: With extra time/assistive device  Expression Expression Mode: Verbal Expression: 5-Expresses complex 90% of the time/cues < 10% of the time  Social Interaction Social Interaction: 7-Interacts appropriately with others - No medications needed.  Problem Solving Problem Solving: 5-Solves complex 90% of the time/cues < 10% of the time  Memory Memory: 5-Recognizes or recalls 90% of the time/requires cueing < 10% of the time Medical Problem List and Plan: 1. Functional deficits secondary to Cervical spondylosis with myelopathy s/p decompressive lam.  2. DVT Prophylaxis/Anticoagulation: Mechanical: Sequential compression devices, below knee Bilateral lower extremities 3. Pain Management: prn ultram and robaxin effective.   -limiting due to neuro-sedating se 4. Mood: Will continue zoloft daily. LCSW to follow for evaluation and support.  5. Neuropsych: This patient is capable of making decisions on his own behalf. 6. Skin/Wound Care: Routine pressure relief measures.  7. Fluids/Electrolytes/Nutrition: Monitor I/O.  8. DM type 2 with retinopathy: sugars still labile. Increase lantus to 80 units qhs which is his home dose with SSI as needed for elevated BS.   -sugar low this am---reduce lantus to 70units  -added am amaryl yesterday  -rx UTI 9. NASH: Continue lactulose and Xifaxan bid. Ammonia level within normal limits  -On questran to help with diarrhea.  10. COPD: Respiratory status stable.  11. HTN: will monitor tid. Continue Corgard, Lasix and Norvasc.  12. Glaucoma:  Continue Timoptic and Alphagan bid.  13. Thrombocytopenia: platelets running around 50k  14. Chronic anemia: hgb 10.4--->9.9 15. Klebsiella UTI--cipro for 5 days  LOS (Days) 8 A FACE TO FACE EVALUATION WAS PERFORMED  [name] 11/28/2014 7:46 AM

## 2014-11-29 ENCOUNTER — Inpatient Hospital Stay (HOSPITAL_COMMUNITY): Payer: Medicare Other

## 2014-11-29 ENCOUNTER — Encounter (HOSPITAL_COMMUNITY): Payer: Medicare Other | Admitting: Occupational Therapy

## 2014-11-29 ENCOUNTER — Inpatient Hospital Stay (HOSPITAL_COMMUNITY): Payer: Medicare Other | Admitting: Speech Pathology

## 2014-11-29 LAB — GLUCOSE, CAPILLARY
GLUCOSE-CAPILLARY: 195 mg/dL — AB (ref 70–99)
Glucose-Capillary: 113 mg/dL — ABNORMAL HIGH (ref 70–99)
Glucose-Capillary: 284 mg/dL — ABNORMAL HIGH (ref 70–99)
Glucose-Capillary: 171 mg/dL — ABNORMAL HIGH (ref 70–99)

## 2014-11-29 NOTE — Plan of Care (Signed)
Problem: SCI BOWEL ELIMINATION Goal: RH STG MANAGE BOWEL WITH ASSISTANCE STG Manage Bowel with min.Assistance.  Outcome: Progressing  Problem: RH SKIN INTEGRITY Goal: RH STG SKIN FREE OF INFECTION/BREAKDOWN With mod. assist  Outcome: Progressing Goal: RH STG MAINTAIN SKIN INTEGRITY WITH ASSISTANCE STG Maintain Skin Integrity With min. Assistance.  Outcome: Progressing

## 2014-11-29 NOTE — Progress Notes (Signed)
Physical Therapy Session Note  Patient Details  Name: [name] MRN: [id number] Date of Birth: 1946/10/28  Today's Date: 11/29/2014 PT Individual Time: 1045-1130 PT Individual Time Calculation (min): 45 min   Short Term Goals: Week 1:  PT Short Term Goal 1 (Week 1): STG=LTG due to ELOS  Session #1: Time: 900-930 Individual therapy; Denies pain. Session focused on functional transfers with RW and standing balance (to don and pull up pants prior to leaving the room), gait with RW to and from the gym with close S (improved safety with AD noted and no LOB occurred) though cues for obstacle negotiation due to visual impairments, stair negotiation for community mobility training with overall S using bilateral rails and completed d/c assessment (see d/c summary for details). Discussed plan for family education in next session this morning.   Session #2: Time: 1045-1130   individual therapy; Denies pain. Session focused on family education with pt's wife and daughter in regards to functional gait with RW, transfers with and without RW including bed, furniture and simulated car transfer, bed mobility, w/c parts management and breakdown, reviewed what to do in case of fall at home, general safety recommendations, and LE HEP. Pt's family and pt deny any further questions in regards to mobility and able to return demonstrate safe guarding techniques and cues for safe mobility. Recommending S for gait with RW at this time and use of RW for any standing/gait activities. If pt alone at home, discussed recommendation for pt to perform all mobility at w/c level for safety and pt and family all in agreement.   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

## 2014-11-29 NOTE — Plan of Care (Signed)
Problem: RH Swallowing Goal: LTG Patient will consume least restrictive PO diet (SLP) LTG: Patient will consume least restrictive PO diet with assist for use of compensatory strategies (SLP)  Outcome: Completed/Met Date Met:  11/29/14 Goal: LTG Patient will participate in dysphagia therapy (SLP) LTG: Patient will participate in dysphagia therapy with assist to increase swallow function as evidenced by bedside or objective clinical assessment (SLP)  Outcome: Completed/Met Date Met:  11/29/14 Goal: LTG Patient will demonstrate a functional change in (SLP) LTG: Patient will demonstrate a functional change in oral/oropharyngeal swallow as evidenced by an objective assessment (SLP)  Outcome: Completed/Met Date Met:  11/29/14

## 2014-11-29 NOTE — Plan of Care (Signed)
Problem: RH SKIN INTEGRITY Goal: RH STG SKIN FREE OF INFECTION/BREAKDOWN With mod. assist  Outcome: Progressing

## 2014-11-29 NOTE — Plan of Care (Signed)
Problem: RH Balance Goal: LTG Patient will maintain dynamic standing balance (PT) LTG: Patient will maintain dynamic standing balance with assistance during mobility activities (PT)  Outcome: Completed/Met Date Met:  11/29/14  Problem: RH Bed Mobility Goal: LTG Patient will perform bed mobility with assist (PT) LTG: Patient will perform bed mobility with assistance, with/without cues (PT).  Outcome: Completed/Met Date Met:  11/29/14  Problem: RH Bed to Chair Transfers Goal: LTG Patient will perform bed/chair transfers w/assist (PT) LTG: Patient will perform bed/chair transfers with assistance, with/without cues (PT).  Outcome: Completed/Met Date Met:  11/29/14  Problem: RH Car Transfers Goal: LTG Patient will perform car transfers with assist (PT) LTG: Patient will perform car transfers with assistance (PT).  Outcome: Completed/Met Date Met:  11/29/14  Problem: RH Ambulation Goal: LTG Patient will ambulate in controlled environment (PT) LTG: Patient will ambulate in a controlled environment, # of feet with assistance (PT).  Outcome: Completed/Met Date Met:  11/29/14 Goal: LTG Patient will ambulate in home environment (PT) LTG: Patient will ambulate in home environment, # of feet with assistance (PT).  Outcome: Completed/Met Date Met:  11/29/14 Goal: LTG Patient will ambulate in community environment (PT) LTG: Patient will ambulate in community environment, # of feet with assistance (PT).  Outcome: Not Applicable Date Met:  49/96/92

## 2014-11-29 NOTE — Progress Notes (Signed)
Occupational Therapy Session Note  Patient Details  Name: [name] MRN: [id number] Date of Birth: 07-Jan-1946  Today's Date: 11/29/2014 OT Individual Time: 7681-1572 OT Individual Time Calculation (min): 30 min    Short Term Goals: Week 1:  OT Short Term Goal 1 (Week 1): STG=LTG due to short ELOS  Skilled Therapeutic Interventions/Progress Updates:    Pt seen for 1:1 OT session with focus on functional mobility, transfers, and activity tolerance. Pt received sitting in w/c agreeable to OT at this time. Pt ambulated around unit 150+ feet with supervision using RW and no cues for emergent awareness. Pt required 2 rest breaks during functional mobility. Completed furniture transfer, bed transfer, and toilet transfer at supervision level. Pt returned to room and left with all needs in reach. Pt with no questions or concerns about discharge at this time.   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

## 2014-11-29 NOTE — Plan of Care (Signed)
Problem: SCI BOWEL ELIMINATION Goal: RH STG MANAGE BOWEL WITH ASSISTANCE STG Manage Bowel with min.Assistance.  Outcome: Completed/Met Date Met:  11/29/14

## 2014-11-29 NOTE — Plan of Care (Signed)
Problem: RH SKIN INTEGRITY Goal: RH STG MAINTAIN SKIN INTEGRITY WITH ASSISTANCE STG Maintain Skin Integrity With min Assistance.  Outcome: Progressing     

## 2014-11-29 NOTE — Progress Notes (Signed)
Speech Language Pathology Session Note & Discharge Summary  Patient Details  Name: [name] MRN: [id number] Date of Birth: 06/20/1946  Today's Date: 11/29/2014 SLP Individual Time: 1135-1220 SLP Individual Time Calculation (min): 45 min   Skilled Therapeutic Interventions:  Skilled treatment session focused on patient/family education. SLP facilitated session by providing education to the patient and his family in regards to his current swallowing function, diet recommendations, appropriate textures and swallowing compensatory strategies. Patient also consumed his lunch meal of Dys. 2 textures with thin liquids without overt s/s of aspiration while patient's family provided appropriate cueing for utilization of swallowing strategies. All questions were answered and handouts were also given to reinforce information. Patient will discharge home tomorrow with family.   Patient has met 3 of 3 long term goals.  Patient to discharge at overall Modified Independent level.   Reasons goals not met: N/A   Clinical Impression/Discharge Summary: Patient has made functional gains and has met 3 of 3 LTG's this admission due to improved swallowing function. Currently, patient is consuming Dys. 2 textures with thin liquids via straw without overt s/s of aspiration and is Mod I for use of swallowing compensatory strategies. Patient/family education complete and patient will discharge home with family. Patient would benefit from f/u home health SLP services to maximize his overall swallowing function with the least restrictive diet.   Care Partner:  Caregiver Able to Provide Assistance: Yes  Type of Caregiver Assistance: Physical  Recommendation:  Home Health SLP  Rationale for SLP Follow Up: Maximize swallowing safety   Equipment: N/A   Reasons for discharge: Treatment goals met;Discharged from hospital   Patient/Family Agrees with Progress Made and Goals Achieved: Yes   See FIM for current  functional status  [name] 11/29/2014, 4:13 PM  [place], [place], [place]

## 2014-11-29 NOTE — Progress Notes (Signed)
Occupational Therapy Session note & Discharge Summary  Patient Details  Name: ABDULKAREEM [name] MRN: [id number] Date of Birth: [birth year]  Today's Date: 11/29/2014 OT Individual Time: 1000-1045 OT Individual Time Calculation (min): 45 min   SESSION NOTE 1000-1045 - 32 Minutes Individual Therapy No complaints of pain Patient received seated in w/c with wife and daughter present for family education. Patient performed ADL at shower level. Patient performing functional transfers and functional tasks at an overall supervision level. Patient's wife is independent to assist patient appropriately and prn. Therapist educated wife on changing out cervical collar pads after showering. Discussed importance of patient being in supine position when removing collar to change pads. Therapist assisted patient > tub room for practice with tub/shower transfer onto shower seat, simulated like at home; patient supervision for this. At end of session, left patient seated in gym with family present for next PT family education session.  -------------------------------------------------------------------------------------------------------------------------------------------------  DISCHARGE SUMMARY  Patient has met 9 of 9 long term goals due to improved activity tolerance, improved balance, postural control, ability to compensate for deficits, improved attention, improved awareness and improved coordination.  Patient to discharge at overall Supervision level.  Patient's care partner is independent to provide the necessary supervision prn assistance at discharge.    Reasons goals not met: n/a, all goals met at this time.   Recommendation:  Patient will benefit from ongoing skilled OT services in home health setting to continue to advance functional skills in the area of BADL, iADL and Reduce care partner burden.  Equipment: No equipment provided. Patient has a shower seat. Patient does not need a BSC at this  time, per family report, patient has grab bars surrounding his toilet seat and in the shower.   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   Sensation Sensation Light Touch: Appears Intact Coordination Gross Motor Movements are Fluid and Coordinated: Yes Fine Motor Movements are Fluid and Coordinated: Yes   Motor  Motor Motor: Within Functional Limits   Trunk/Postural Assessment  Cervical Assessment Cervical Assessment:  (cervical hard collar) Thoracic Assessment Thoracic Assessment: Within Functional Limits Lumbar Assessment Lumbar Assessment: Within Functional Limits   Balance Balance Balance Assessed: Yes Static Sitting Balance Static Sitting - Level of Assistance: 6: Modified independent (Device/Increase time) Dynamic Sitting Balance Dynamic Sitting - Level of Assistance: 6: Modified independent (Device/Increase time) Static Standing Balance Static Standing - Level of Assistance: 5: Stand  by assistance (with RW) Dynamic Standing Balance Dynamic Standing - Level of Assistance: 5: Stand by assistance (with RW)   Extremity/Trunk Assessment RUE Assessment RUE Assessment: Within Functional Limits LUE Assessment LUE Assessment: Within Functional Limits  See FIM for current functional status  Shylin Keizer 11/29/2014, 11:06 AM

## 2014-11-29 NOTE — Plan of Care (Signed)
Problem: RH Balance Goal: LTG: Patient will maintain dynamic sitting balance (OT) LTG: Patient will maintain dynamic sitting balance with assistance during activities of daily living (OT)  Outcome: Completed/Met Date Met:  11/29/14 Goal: LTG Patient will maintain dynamic standing with ADLs (OT) LTG: Patient will maintain dynamic standing balance with assist during activities of daily living (OT)  Outcome: Completed/Met Date Met:  11/29/14  Problem: RH Grooming Goal: LTG Patient will perform grooming w/assist,cues/equip (OT) LTG: Patient will perform grooming with assist, with/without cues using equipment (OT)  Outcome: Completed/Met Date Met:  11/29/14  Problem: RH Bathing Goal: LTG Patient will bathe with assist, cues/equipment (OT) LTG: Patient will bathe specified number of body parts with assist with/without cues using equipment (position) (OT)  Outcome: Completed/Met Date Met:  11/29/14  Problem: RH Dressing Goal: LTG Patient will perform upper body dressing (OT) LTG Patient will perform upper body dressing with assist, with/without cues (OT).  Outcome: Completed/Met Date Met:  11/29/14 Goal: LTG Patient will perform lower body dressing w/assist (OT) LTG: Patient will perform lower body dressing with assist, with/without cues in positioning using equipment (OT)  Outcome: Completed/Met Date Met:  11/29/14  Problem: RH Toileting Goal: LTG Patient will perform toileting w/assist, cues/equip (OT) LTG: Patient will perform toiletiing (clothes management/hygiene) with assist, with/without cues using equipment (OT)  Outcome: Completed/Met Date Met:  11/29/14  Problem: RH Toilet Transfers Goal: LTG Patient will perform toilet transfers w/assist (OT) LTG: Patient will perform toilet transfers with assist, with/without cues using equipment (OT)  Outcome: Completed/Met Date Met:  11/29/14  Problem: RH Tub/Shower Transfers Goal: LTG Patient will perform tub/shower transfers w/assist  (OT) LTG: Patient will perform tub/shower transfers with assist, with/without cues using equipment (OT)  Outcome: Completed/Met Date Met:  11/29/14

## 2014-11-29 NOTE — Progress Notes (Signed)
Physical Therapy Discharge Summary  Patient Details  Name: [name] MRN: [id number] Date of Birth: 02/07/1946   Patient has met 6 of 6 long term goals due to improved activity tolerance, improved balance, decreased pain, ability to compensate for deficits, functional use of  right upper extremity, right lower extremity, left upper extremity and left lower extremity, improved awareness and improved coordination.  Patient to discharge at an ambulatory level Supervision using RW and mod I for w/c mobility.  Patient's family (wife and daughter) is independent to provide the necessary supervision assistance at discharge.They successfully completed family education.   Reasons goals not met: n/a- all goals met at this time.  Recommendation:  Patient will benefit from ongoing skilled PT services in home health setting to continue to advance safe functional mobility, address ongoing impairments in gait, balance, endurance, safety, transfers, and minimize fall risk.  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 Sensation Sensation Light Touch: Appears Intact Coordination Gross Motor Movements are Fluid and Coordinated: Yes Motor  Motor Motor: Within Functional Limits  Locomotion  Ambulation Ambulation/Gait Assistance: 5:  Supervision  Trunk/Postural Assessment  Cervical Assessment Cervical Assessment:  (cervical hard collar) Thoracic Assessment Thoracic Assessment: Within Functional Limits Lumbar Assessment Lumbar Assessment: Within Functional Limits  Balance Balance Balance Assessed: Yes Static Sitting Balance Static Sitting - Level of Assistance: 6: Modified independent (Device/Increase time) Dynamic Sitting Balance Dynamic Sitting - Level of Assistance: 6: Modified independent (Device/Increase time) Static Standing Balance Static Standing - Level of Assistance: 5: Stand by assistance (with RW) Dynamic Standing Balance Dynamic Standing - Level of Assistance: 5: Stand by assistance (with RW) Extremity Assessment      RLE Assessment RLE Assessment: Within Functional Limits (decreased muscular endurance) LLE Assessment LLE Assessment: Within Functional Limits (decreased muscular endurance)  See FIM for current functional status  [name] [facility] 11/29/2014, 9:21 AM

## 2014-11-29 NOTE — Progress Notes (Signed)
PHYSICAL MEDICINE & REHABILITATION     PROGRESS NOTE    Subjective/Complaints: Had a good night. Wonders when he's going home.   Objective: Vital Signs: Blood pressure 129/56, pulse 91, temperature 98.6 F (37 C), temperature source Oral, resp. rate 18, height 5' (1.524 m), weight 100.3 kg (221 lb 1.9 oz), SpO2 96 %. No results found. No results for input(s): WBC, HGB, HCT, PLT in the last 72 hours. No results for input(s): NA, K, CL, GLUCOSE, BUN, CREATININE, CALCIUM in the last 72 hours.  Invalid input(s): CO CBG (last 3)   Recent Labs  11/28/14 1645 11/28/14 2200 11/29/14 0644  GLUCAP 84 220* 113*    Wt Readings from Last 3 Encounters:  11/21/14 100.3 kg (221 lb 1.9 oz)  11/13/14 91.5 kg (201 lb 11.5 oz)  10/02/14 88.814 kg (195 lb 12.8 oz)    Physical Exam:   Constitutional: He is oriented to person, place, and time. He appears well-developed and well-nourished. Verbose HENT:  Head: Normocephalic and atraumatic.  Eyes: Conjunctivae are normal. Pupils are equal, round, and reactive to light.  Neck:  Immobilized in collar. Bruising better around neck Cardiovascular: Normal rate and regular rhythm.  No murmur heard. Respiratory: Effort normal and breath sounds normal.  GI: Distended and tympanic (ascites?). Diastasis recti evident. Bowel sounds are normal. Abdomen non-tender Musculoskeletal: He exhibits no edema or tenderness.  Neurological: He is alert and oriented to person, place, and time.    dysarthria  . Able to follow commands without difficulty. Sensory deficits BLE and bilateral upper exts and trunk---1+/2. UES grossly 5/5 proximal to distal. LE's 4/5 HF, KE to 5/5 distally at ankles. still impulsive and safety judgement impaired. Impaired balance. impulsive Skin: Skin is warm and dry. Ecchymotic areas on BUE. BLE with excoriated areas.  Psychiatric: He has a normal mood and affect. His behavior is normal Assessment/Plan: 1. 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 Physiatrist is providing close team supervision and 24 hour management of active medical problems listed below. Physiatrist and rehab team continue to assess barriers to discharge/monitor patient progress toward functional and medical goals. FIM: FIM - Bathing Bathing Steps Patient Completed: Chest, Right Arm, Left Arm, Abdomen, Front perineal area, Buttocks, Right upper leg, Left upper leg, Right lower leg (including foot), Left lower leg (including foot) Bathing: 5: Supervision: Safety issues/verbal cues (using LH sponge)  FIM - Upper Body Dressing/Undressing Upper body dressing/undressing steps patient completed: Thread/unthread right sleeve of pullover shirt/dresss, Thread/unthread left sleeve of pullover shirt/dress, Put head through opening of pull over shirt/dress, Pull shirt over trunk Upper body dressing/undressing: 5: Set-up assist to: Obtain clothing/put away FIM - Lower Body Dressing/Undressing Lower body dressing/undressing steps patient completed: Thread/unthread right underwear leg, Thread/unthread left underwear leg, Pull underwear up/down, Thread/unthread right pants leg, Thread/unthread left pants leg, Pull pants up/down, Don/Doff right shoe, Don/Doff left shoe, Fasten/unfasten right shoe, Fasten/unfasten left shoe Lower body dressing/undressing: 5: Supervision: Safety issues/verbal cues  FIM - Toileting Toileting steps completed by patient: Adjust clothing prior to toileting, Performs perineal hygiene, Adjust clothing after toileting Toileting Assistive Devices: Grab bar or rail for support Toileting: 5: Supervision: Safety issues/verbal cues  FIM - Radio producer Devices: Insurance account manager Transfers: 5-To toilet/BSC: Supervision (verbal cues/safety issues),  5-From toilet/BSC: Supervision (verbal cues/safety issues)  FIM - Control and instrumentation engineer Devices: Adult nurse Transfer: 5:  Supine > Sit: Supervision (verbal cues/safety issues), 5: Sit > Supine: Supervision (verbal cues/safety issues)  FIM - Locomotion: Wheelchair Locomotion: Wheelchair: 0: Activity did not occur FIM - Locomotion: Ambulation Locomotion: Ambulation Assistive Devices: Administrator Ambulation/Gait Assistance: 4: Min guard, 5: Supervision Locomotion: Ambulation: 4: Travels 150 ft or more with minimal assistance (Pt.>75%)  Comprehension Comprehension Mode: Auditory Comprehension: 6-Follows complex conversation/direction: With extra time/assistive device  Expression Expression Mode: Verbal Expression: 6-Expresses complex ideas: With extra time/assistive device  Social Interaction Social Interaction: 6-Interacts appropriately with others with medication or extra time (anti-anxiety, antidepressant).  Problem Solving Problem Solving: 6-Solves complex problems: With extra time  Memory Memory: 6-More than reasonable amt of time Medical Problem List and Plan: 1. Functional deficits secondary to Cervical spondylosis with myelopathy s/p decompressive lam.  2. DVT Prophylaxis/Anticoagulation: Mechanical: Sequential compression devices, below knee Bilateral lower extremities 3. Pain Management: prn ultram and robaxin effective.   -limiting due to neuro-sedating se 4. Mood: Will continue zoloft daily. LCSW to follow for evaluation and support.  5. Neuropsych: This patient is capable of making decisions on his own behalf. 6. Skin/Wound Care: Routine pressure relief measures.  7. Fluids/Electrolytes/Nutrition: Monitor I/O.  8. DM type 2 with retinopathy: sugars still labile. Increase lantus to 80 units qhs which is his home dose with SSI as needed for elevated BS.   -sugar better this am. Decreased lantus to 70unitshs  -added am amaryl  yesterday  -rx UTI 9. NASH: Continue lactulose and Xifaxan bid. Ammonia level within normal limits  -On questran to help with diarrhea.  10. COPD: Respiratory status stable.  11. HTN: will monitor tid. Continue Corgard, Lasix and Norvasc.  12. Glaucoma: Continue Timoptic and Alphagan bid.  13. Thrombocytopenia: platelets running around 50k  14. Chronic anemia: hgb 10.4--->9.9 15. Klebsiella UTI--cipro for 5 days  LOS (Days) 9 A FACE TO FACE EVALUATION WAS PERFORMED  Niel Peretti T 11/29/2014 8:59 AM

## 2014-11-30 LAB — GLUCOSE, CAPILLARY: Glucose-Capillary: 166 mg/dL — ABNORMAL HIGH (ref 70–99)

## 2014-11-30 MED ORDER — TRAMADOL HCL 50 MG PO TABS: 50.0000 mg | ORAL_TABLET | Freq: Three times a day (TID) | ORAL | Status: DC | PRN

## 2014-11-30 MED ORDER — INSULIN GLARGINE 100 UNIT/ML ~~LOC~~ SOLN: 70.0000 [IU] | Freq: Every day | SUBCUTANEOUS | Status: DC

## 2014-11-30 MED ORDER — SERTRALINE HCL 25 MG PO TABS: 25.0000 mg | ORAL_TABLET | Freq: Every day | ORAL | Status: DC

## 2014-11-30 MED ORDER — LACTULOSE 10 GM/15ML PO SOLN
30.0000 g | Freq: Two times a day (BID) | ORAL | Status: DC
  Filled 2014-11-30 (×2): qty 45

## 2014-11-30 MED ORDER — METHOCARBAMOL 500 MG PO TABS: 500.0000 mg | ORAL_TABLET | Freq: Three times a day (TID) | ORAL | Status: DC | PRN

## 2014-11-30 MED ORDER — RIFAXIMIN 550 MG PO TABS: 550.0000 mg | ORAL_TABLET | Freq: Two times a day (BID) | ORAL | Status: AC

## 2014-11-30 MED ORDER — FUROSEMIDE 40 MG PO TABS: 40.0000 mg | ORAL_TABLET | Freq: Every day | ORAL | Status: AC

## 2014-11-30 MED ORDER — LACTULOSE 10 GM/15ML PO SOLN: 30.0000 g | Freq: Two times a day (BID) | ORAL | Status: AC

## 2014-11-30 MED ORDER — OMEPRAZOLE 20 MG PO CPDR: 20.0000 mg | DELAYED_RELEASE_CAPSULE | Freq: Two times a day (BID) | ORAL | Status: AC

## 2014-11-30 MED ORDER — FERROUS SULFATE 325 (65 FE) MG PO TABS: 325.0000 mg | ORAL_TABLET | Freq: Three times a day (TID) | ORAL | Status: DC

## 2014-11-30 MED ORDER — CHOLESTYRAMINE 4 G PO PACK: 4.0000 g | PACK | Freq: Three times a day (TID) | ORAL | Status: DC

## 2014-11-30 MED ORDER — ALLOPURINOL 100 MG PO TABS: 100.0000 mg | ORAL_TABLET | Freq: Two times a day (BID) | ORAL | Status: DC

## 2014-11-30 MED ORDER — VITAMIN D-3 25 MCG (1000 UT) PO CAPS: 1000.0000 [IU] | ORAL_CAPSULE | Freq: Every day | ORAL | Status: DC

## 2014-11-30 MED ORDER — GLIMEPIRIDE 1 MG PO TABS: 1.0000 mg | ORAL_TABLET | Freq: Every day | ORAL | Status: DC

## 2014-11-30 NOTE — Progress Notes (Signed)
Discharged to home accompanied by wife and dtr. Discharge instructions reviewed by PA and equipment/belongings packed up an sent with pt. Margarito Liner

## 2014-11-30 NOTE — Plan of Care (Signed)
Problem: RH SKIN INTEGRITY Goal: RH STG SKIN FREE OF INFECTION/BREAKDOWN With mod. assist  Outcome: Completed/Met Date Met:  11/30/14 Goal: RH STG MAINTAIN SKIN INTEGRITY WITH ASSISTANCE STG Maintain Skin Integrity With min. Assistance.  Outcome: Completed/Met Date Met:  11/30/14

## 2014-11-30 NOTE — Plan of Care (Signed)
Problem: RH SKIN INTEGRITY Goal: RH STG SKIN FREE OF INFECTION/BREAKDOWN With mod. assist  Outcome: Progressing Goal: RH STG MAINTAIN SKIN INTEGRITY WITH ASSISTANCE STG Maintain Skin Integrity With min. Assistance.  Outcome: Progressing

## 2014-11-30 NOTE — Discharge Instructions (Signed)
Inpatient Rehab Discharge Instructions  [name] Discharge date and time:  11/30/14  Activities/Precautions/ Functional Status: Activity: activity as tolerated Diet: diabetic diet Wound Care: keep wound clean and dry   Functional status:  ___ No restrictions     ___ Walk up steps independently _X__ 24/7 supervision/assistance   ___ Walk up steps with assistance ___ Intermittent supervision/assistance  ___ Bathe/dress independently _X__ Walk with walker    _X__ Bathe/dress with assistance ___ Walk Independently    ___ Shower independently ___ Walk with assistance    ___ Shower with assistance ___ No alcohol     ___ Return to work/school ________  COMMUNITY REFERRALS UPON DISCHARGE:   Home Health:   PT     OT     ST    RN    SNA  Agency:  [facility] Phone:  [phone] Medical Equipment/Items Ordered:  16x16 lightweight with 16x16 basic cushion; hospital bed  Agency/Supplier:  [facility]        Phone:  [phone] Other:  CSW made referral for [facility]) and they will be contacting you.  Special Instructions: 1. Wear neck collar at all times.  2. Check blood sugars 2-3 times a day--before meals and/or at bedtime.   My questions have been answered and I understand these instructions. I will adhere to these goals and the provided educational materials after my discharge from the hospital.  Patient/Caregiver Signature _______________________________ Date __________  Clinician Signature _______________________________________ Date __________  Please bring this form and your medication list with you to all your follow-up doctor's appointments.

## 2014-11-30 NOTE — Progress Notes (Signed)
[facility] PHYSICAL MEDICINE & REHABILITATION     PROGRESS NOTE    Subjective/Complaints: No complaints. Happy to be going home today. Appreciate of team   Objective: Vital Signs: Blood pressure 137/63, pulse 81, temperature 97.5 F (36.4 C), temperature source Oral, resp. rate 18, height 5' (1.524 m), weight 100.3 kg (221 lb 1.9 oz), SpO2 99 %. No results found. No results for input(s): WBC, HGB, HCT, PLT in the last 72 hours. No results for input(s): NA, K, CL, GLUCOSE, BUN, CREATININE, CALCIUM in the last 72 hours.  Invalid input(s): CO CBG (last 3)   Recent Labs  11/29/14 1618 11/29/14 2102 11/30/14 0646  GLUCAP 171* 195* 166*    Wt Readings from Last 3 Encounters:  11/21/14 100.3 kg (221 lb 1.9 oz)  11/13/14 91.5 kg (201 lb 11.5 oz)  10/02/14 88.814 kg (195 lb 12.8 oz)    Physical Exam:   Constitutional: He is oriented to person, place, and time. He appears well-developed and well-nourished. Verbose HENT:  Head: Normocephalic and atraumatic.  Eyes: Conjunctivae are normal. Pupils are equal, round, and reactive to light.  Neck:  Immobilized in collar. Bruising better around neck Cardiovascular: Normal rate and regular rhythm.  No murmur heard. Respiratory: Effort normal and breath sounds normal.  GI: Distended and tympanic (ascites?). Diastasis recti evident. Bowel sounds are normal. Abdomen non-tender Musculoskeletal: He exhibits no edema or tenderness.  Neurological: He is alert and oriented to person, place, and time.    dysarthria  . Able to follow commands without difficulty. Sensory deficits BLE and bilateral upper exts and trunk---1+/2. UES grossly 5/5 proximal to distal. LE's 4+/5 HF, KE to 5/5 distally at ankles. still impulsive and safety judgement impaired. Impaired balance. impulsive Skin: Skin is warm and dry. Ecchymotic areas on BUE. BLE with excoriated areas.  Psychiatric: He has a normal mood and affect. His behavior is  normal  Assessment/Plan: 1. 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 Physiatrist is providing close team supervision and 24 hour management of active medical problems listed below. Physiatrist and rehab team continue to assess barriers to discharge/monitor patient progress toward functional and medical goals. FIM: FIM - Bathing Bathing Steps Patient Completed: Chest, Right Arm, Left Arm, Abdomen, Front perineal area, Buttocks, Right upper leg, Left upper leg, Right lower leg (including foot), Left lower leg (including foot) Bathing: 5: Supervision: Safety issues/verbal cues  FIM - Upper Body Dressing/Undressing Upper body dressing/undressing steps patient completed: Thread/unthread right sleeve of pullover shirt/dresss, Thread/unthread left sleeve of pullover shirt/dress, Put head through opening of pull over shirt/dress, Pull shirt over trunk Upper body dressing/undressing: 5: Set-up assist to: Obtain clothing/put away FIM - Lower Body Dressing/Undressing Lower body dressing/undressing steps patient completed: Thread/unthread right underwear leg, Thread/unthread left underwear leg, Pull underwear up/down, Thread/unthread right pants leg, Thread/unthread left pants leg, Pull pants up/down, Don/Doff right shoe, Don/Doff left shoe, Fasten/unfasten right shoe, Fasten/unfasten left shoe Lower body dressing/undressing: 5: Supervision: Safety issues/verbal cues  FIM - Toileting Toileting steps completed by patient: Adjust clothing prior to toileting, Performs perineal hygiene, Adjust clothing after toileting Toileting Assistive Devices: Grab bar or rail for support Toileting: 5: Supervision: Safety issues/verbal cues  FIM - Radio producer Devices: Environmental consultant, Product manager Transfers: 5-To toilet/BSC:  Supervision (verbal cues/safety issues), 5-From toilet/BSC: Supervision (verbal cues/safety issues)  FIM - Control and instrumentation engineer Devices: Adult nurse  Transfer: 6: Supine > Sit: No assist, 6: Sit > Supine: No assist, 5: Bed > Chair or W/C: Supervision (verbal cues/safety issues), 5: Chair or W/C > Bed: Supervision (verbal cues/safety issues)  FIM - Locomotion: Wheelchair Locomotion: Wheelchair: 6: Travels 150 ft or more, turns around, maneuvers to table, bed or toilet, negotiates 3% grade: maneuvers on rugs and over door sills independently FIM - Locomotion: Ambulation Locomotion: Ambulation Assistive Devices: Administrator Ambulation/Gait Assistance: 5: Supervision Locomotion: Ambulation: 5: Travels 150 ft or more with supervision/safety issues  Comprehension Comprehension Mode: Auditory Comprehension: 6-Follows complex conversation/direction: With extra time/assistive device  Expression Expression Mode: Verbal Expression: 6-Expresses complex ideas: With extra time/assistive device  Social Interaction Social Interaction: 6-Interacts appropriately with others with medication or extra time (anti-anxiety, antidepressant).  Problem Solving Problem Solving: 6-Solves complex problems: With extra time  Memory Memory: 6-More than reasonable amt of time   Medical Problem List and Plan: 1. Functional deficits secondary to Cervical spondylosis with myelopathy s/p decompressive lam.  2. DVT Prophylaxis/Anticoagulation: Mechanical: Sequential compression devices, below knee Bilateral lower extremities 3. Pain Management: prn ultram and robaxin effective.   -limiting due to neuro-sedating se 4. Mood: Will continue zoloft daily. LCSW to follow for evaluation and support.  5. Neuropsych: This patient is capable of making decisions on his own behalf. 6. Skin/Wound Care: Routine pressure relief measures.  7. Fluids/Electrolytes/Nutrition: Monitor I/O.   8. DM type 2 with retinopathy: sugars still labile. Increase lantus to 80 units qhs which is his home dose with SSI as needed for elevated BS.   -sugar better this am. Decreased lantus to 70unitshs  -added am amaryl yesterday  -rx UTI 9. NASH: Continue lactulose and Xifaxan bid. Ammonia level within normal limits  -On questran to help with diarrhea.  10. COPD: Respiratory status stable.  11. HTN: will monitor tid. Continue Corgard, Lasix and Norvasc.  12. Glaucoma: Continue Timoptic and Alphagan bid.  13. Thrombocytopenia: platelets running around 50k  14. Chronic anemia: hgb 10.4--->9.9 15. Klebsiella UTI--cipro completed  LOS (Days) 10 A FACE TO FACE EVALUATION WAS PERFORMED  Sadiel Mota T 11/30/2014 7:46 AM

## 2014-11-30 NOTE — Progress Notes (Signed)
Social Work Discharge Note  The overall goal for the admission was met for:   Discharge location: Yes - home  Length of Stay: Yes - 10 days  Discharge activity level: Yes - supervision  Home/community participation: Yes  Services provided included: MD, RD, PT, OT, SLP, RN, Pharmacy and SW  Financial Services: Medicare and Medicaid  Follow-up services arranged: Home Health: PT/OT/SLP/RN/ST, DME: 16x16 lightweight wheelchair; 16x16 basic cushion; hospital bed and Patient/Family request agency HH: [facility], DME: [facility]  Comments (or additional information):  Pt is going to his home with his wife and dtr for 24/7 supervision.  He will have home health to follow him, as well.  CSW also made a PCS referral to resume services for pt.  Patient/Family verbalized understanding of follow-up arrangements: Yes  Individual responsible for coordination of the follow-up plan:   Pt, his wife, and his dtr  Confirmed correct DME delivered: Trey Sailors 11/30/2014    Ransom Nickson, [name]

## 2014-12-03 NOTE — Discharge Summary (Signed)
Physician Discharge Summary  Patient ID: ZAKI GERTSCH MRN: [id number] DOB/AGE: 67/31/1947 67 y.o.  Admit date: 11/20/2014 Discharge date: 11/30/2014  Discharge Diagnoses:  Principal Problem:   Cervical spondylosis with myelopathy Active Problems:   Type 2 diabetes mellitus, uncontrolled, with retinopathy   History of COPD   NASH (nonalcoholic steatohepatitis)   UTI (lower urinary tract infection)   Cognitive deficits   Thrombocytopenia   acute on chronic anemia   Discharged Condition: Stable.    Labs:  Basic Metabolic Panel: CMP Latest Ref Rng 11/21/2014 11/17/2014 11/16/2014  Glucose 70 - 99 mg/dL 221(H) 231(H) 249(H)  BUN 6 - 23 mg/dL 15 25(H) 23  Creatinine 0.50 - 1.35 mg/dL 0.70 0.69 0.71  Sodium 137 - 147 mEq/L 137 140 138  Potassium 3.7 - 5.3 mEq/L 4.3 4.6 4.5  Chloride 96 - 112 mEq/L 99 105 105  CO2 19 - 32 mEq/L 27 26 26   Calcium 8.4 - 10.5 mg/dL 8.4 8.8 8.5  Total Protein 6.0 - 8.3 g/dL 7.1 - -  Total Bilirubin 0.3 - 1.2 mg/dL 1.1 - -  Alkaline Phos 39 - 117 U/L 100 - -  AST 0 - 37 U/L 21 - -  ALT 0 - 53 U/L 23 - -     CBC: CBC Latest Ref Rng 11/23/2014 11/21/2014 11/17/2014  WBC 4.0 - 10.5 K/uL 4.7 5.0 4.5  Hemoglobin 13.0 - 17.0 g/dL 9.9(L) 10.4(L) 9.4(L)  Hematocrit 39.0 - 52.0 % 29.9(L) 31.2(L) 29.1(L)  Platelets 150 - 400 K/uL 49(L) 50(L) 54(L)     CBG:  Recent Labs Lab 11/29/14 0644 11/29/14 1144 11/29/14 1618 11/29/14 2102 11/30/14 0646  GLUCAP 113* 284* 171* 195* 166*    Brief HPI:   MAURION WALKOWIAK Sr. is a 68 y.o. male with history of HTN, DM type 2, NASH, COPD, who has had problems gait disorder with numbness BLE and BUE with difficulty walking since June. Work up with significant cervical spondylitic changes with large osteophytes C3-C7 with cord compression but sugery few months ago was cancelled due to anemia with significant thrombocytopenia. He was evaluated by hematology and was recently cleared for surgery with  recommendations to follow platelets. He was admitted on 11/13/14 for ACDF C3-C7 by Dr. Kathyrn Sheriff. Post op NPO due to overt signs of aspiration and suspicion of post op edema. Therapy ongoing in addition to dysphagia treatment and he was started on dysphagia 1 thin liquids. Patient with balance deficits as well as weakness and disorientation. CIR was recommended for follow up therapy.   Hospital Course: SHAWNE ESKELSON was admitted to rehab 11/20/2014 for inpatient therapies to consist of PT, ST and OT at least three hours five days a week. Past admission physiatrist, therapy team and rehab RN have worked together to provide customized collaborative inpatient rehab. Vitals have been monitored on tid basis and have been stable. Neck incision is healing well without signs or symptoms of infection. Ammonia levels were checked past admission and was WNL. He was found to have Klebsiella Oxytoca UTI and was treated with cipro x 5 days.  Confusion and disorientation has steadily improved with familiarity as well as treated of UTI. Po intake has been good and diet was advanced to dysphagia 2, thin liquids.  Diabetes was monitored with ac/hs checks and insulin was decreased due to hypoglycemic episodes. He is to continue to monitor blood sugars past discharge and follow up with primary MD for tighter control as needed.  Follow up CBC shows that H/H is  relatively stable and thrombocytopenia is currently in low 50s range. No signs or symptoms of bleeding noted. Patient has made steady progress and was supervision level at discharge. [facility] will provide HHPT, HHOT, HHRN as well as SNA for follow up past discharge.    Rehab course: During patient's stay in rehab weekly team conferences were held to monitor patient's progress, set goals and discuss barriers to discharge. At admission, patient required min assist with ADL as well as overall mobility. He has had improvement in activity tolerance, balance, postural  control, as well as ability to compensate for deficits. He is able to perform bathing and dressing tasks with supervision. Wife was educated on neck stabilization as well as changing out collar pads after showers.  He is modified independent for wheelchair mobility and requires supervision for ambulation.  He is tolerating dysphagia 2 diet, thin liquids without s/s of aspiration and he is able to utilize safe swallow strategies independently.  Family education was done with daughter and wife who will provide supervision as needed past discharge.     Disposition: 01-Home or Self Care  Diet: Diabetic diet  Special Instructions: 1. Keep neck incision clean and dry.  2. Wear neck collar at all times.  3. Check blood sugars 2-3 times a day--before meals and/or at bedtime.      Medication List    STOP taking these medications        amLODipine 10 MG tablet  Commonly known as:  NORVASC     colchicine 0.6 MG tablet     diazepam 5 MG tablet  Commonly known as:  VALIUM     insulin aspart 100 UNIT/ML FlexPen  Commonly known as:  NOVOLOG     methocarbamol 500 mg in dextrose 5 % 50 mL     nadolol 20 MG tablet  Commonly known as:  CORGARD     Oxycodone HCl 10 MG Tabs     pantoprazole 40 MG tablet  Commonly known as:  PROTONIX      TAKE these medications        allopurinol 100 MG tablet  Commonly known as:  ZYLOPRIM  Take 1 tablet (100 mg total) by mouth 2 (two) times daily.     brimonidine-timolol 0.2-0.5 % ophthalmic solution  Commonly known as:  COMBIGAN  Place 1 drop into the left eye every 12 (twelve) hours.     cholestyramine 4 G packet  Commonly known as:  QUESTRAN  Take 1 packet (4 g total) by mouth 3 (three) times daily.     ferrous sulfate 325 (65 FE) MG tablet  Take 1 tablet (325 mg total) by mouth 3 (three) times daily with meals.     furosemide 40 MG tablet  Commonly known as:  LASIX  Take 1 tablet (40 mg total) by mouth daily.     glimepiride 1 MG tablet   Commonly known as:  AMARYL  Take 1 tablet (1 mg total) by mouth daily with breakfast.     insulin glargine 100 UNIT/ML injection  Commonly known as:  LANTUS  Inject 0.7 mLs (70 Units total) into the skin at bedtime.     lactulose 10 GM/15ML solution  Commonly known as:  CHRONULAC  Take 45 mLs (30 g total) by mouth 2 (two) times daily.     methocarbamol 500 MG tablet  Commonly known as:  ROBAXIN  Take 1 tablet (500 mg total) by mouth 3 (three) times daily as needed for muscle spasms.  omeprazole 20 MG capsule  Commonly known as:  PRILOSEC  Take 1 capsule (20 mg total) by mouth 2 (two) times daily before a meal.     rifaximin 550 MG Tabs tablet  Commonly known as:  XIFAXAN  Take 1 tablet (550 mg total) by mouth 2 (two) times daily.     sertraline 25 MG tablet  Commonly known as:  ZOLOFT  Take 1 tablet (25 mg total) by mouth daily.     traMADol 50 MG tablet--Rx # 90 pills  Commonly known as:  ULTRAM  Take 1 tablet (50 mg total) by mouth every 8 (eight) hours as needed for moderate pain.     Vitamin D-3 1000 UNITS Caps  Take 1 capsule (1,000 Units total) by mouth daily.           Follow-up Information    Follow up with Meredith Staggers, MD On 01/25/2015.   Specialty:  Physical Medicine and Rehabilitation   Why:  Be there at  11:20  for 11:40 am appointment   Contact information:   [place] [address], [address] [place] [address] [phone]       Follow up with Jairo Ben, MD. Call today.   Specialty:  Neurosurgery   Why:  for post op appointment   Contact information:   [address] [place] [address] [phone]       Follow up with Adrian Prows, MD.   Specialty:  Infectious Diseases   Why:  They will call you in the afternoon on 11-30-14 to let you know when your follow up appointment is scheduled.  Social worker will fax Dr. Ola Spurr your discharge summary so he will be aware of your hospital course.   Contact  information:   [facility] [place] [address] [phone]       Signed: Bary Leriche 12/03/2014, 9:22 AM

## 2014-12-10 ENCOUNTER — Other Ambulatory Visit: Payer: Self-pay | Admitting: *Deleted

## 2014-12-10 MED ORDER — METHOCARBAMOL 500 MG PO TABS: 500.0000 mg | ORAL_TABLET | Freq: Three times a day (TID) | ORAL | Status: DC | PRN

## 2014-12-10 NOTE — Telephone Encounter (Signed)
Pt prescription to Methocarbamol was approved by [facility] [employer], Rx sent to pharmacy electronically, patient notified

## 2014-12-24 ENCOUNTER — Emergency Department: Payer: Self-pay | Admitting: Emergency Medicine

## 2014-12-25 LAB — CBC WITH DIFFERENTIAL/PLATELET
BASOS ABS: 0.1 10*3/uL (ref 0.0–0.1)
Basophil %: 1.2 %
EOS ABS: 0.2 10*3/uL (ref 0.0–0.7)
Eosinophil %: 4.8 %
HCT: 30.5 % — ABNORMAL LOW (ref 40.0–52.0)
HGB: 10.3 g/dL — ABNORMAL LOW (ref 13.0–18.0)
LYMPHS PCT: 13.8 %
Lymphocyte #: 0.7 10*3/uL — ABNORMAL LOW (ref 1.0–3.6)
MCH: 31.6 pg (ref 26.0–34.0)
MCHC: 33.7 g/dL (ref 32.0–36.0)
MCV: 94 fL (ref 80–100)
MONOS PCT: 6.8 %
Monocyte #: 0.3 x10 3/mm (ref 0.2–1.0)
NEUTROS ABS: 3.5 10*3/uL (ref 1.4–6.5)
NEUTROS PCT: 73.4 %
PLATELETS: 57 10*3/uL — AB (ref 150–440)
RBC: 3.25 10*6/uL — ABNORMAL LOW (ref 4.40–5.90)
RDW: 17.4 % — AB (ref 11.5–14.5)
WBC: 4.8 10*3/uL (ref 3.8–10.6)

## 2014-12-25 LAB — URINALYSIS, COMPLETE
Bacteria: NONE SEEN
Bilirubin,UR: NEGATIVE
Blood: NEGATIVE
Glucose,UR: >=500 mg/dL (ref 0–75)
Ketone: NEGATIVE
Leukocyte Esterase: NEGATIVE
Nitrite: NEGATIVE
Ph: 7 (ref 4.5–8.0)
Protein: NEGATIVE
RBC,UR: 1 /HPF (ref 0–5)
Specific Gravity: 1.017 (ref 1.003–1.030)
Squamous Epithelial: NONE SEEN
WBC UR: NONE SEEN /HPF (ref 0–5)

## 2014-12-25 LAB — COMPREHENSIVE METABOLIC PANEL
ALK PHOS: 166 U/L — AB
Albumin: 3 g/dL — ABNORMAL LOW (ref 3.4–5.0)
Anion Gap: 8 (ref 7–16)
BUN: 12 mg/dL (ref 7–18)
Bilirubin,Total: 0.9 mg/dL (ref 0.2–1.0)
CALCIUM: 8.6 mg/dL (ref 8.5–10.1)
CHLORIDE: 107 mmol/L (ref 98–107)
CREATININE: 0.9 mg/dL (ref 0.60–1.30)
Co2: 24 mmol/L (ref 21–32)
Glucose: 226 mg/dL — ABNORMAL HIGH (ref 65–99)
OSMOLALITY: 284 (ref 275–301)
Potassium: 4 mmol/L (ref 3.5–5.1)
SGOT(AST): 62 U/L — ABNORMAL HIGH (ref 15–37)
SGPT (ALT): 31 U/L
Sodium: 139 mmol/L (ref 136–145)
Total Protein: 8.1 g/dL (ref 6.4–8.2)

## 2014-12-25 LAB — LIPASE, BLOOD: LIPASE: 320 U/L (ref 73–393)

## 2014-12-25 LAB — TROPONIN I: Troponin-I: <0.02 ng/mL

## 2014-12-27 ENCOUNTER — Ambulatory Visit: Payer: Self-pay | Admitting: Gastroenterology

## 2015-01-22 ENCOUNTER — Other Ambulatory Visit: Payer: Self-pay | Admitting: Physical Medicine & Rehabilitation

## 2015-01-25 ENCOUNTER — Encounter: Payer: Medicare Other | Attending: Physical Medicine & Rehabilitation | Admitting: Physical Medicine & Rehabilitation

## 2015-02-20 ENCOUNTER — Other Ambulatory Visit: Payer: Self-pay | Admitting: Physical Medicine & Rehabilitation

## 2015-03-12 ENCOUNTER — Other Ambulatory Visit: Payer: Self-pay | Admitting: Physical Medicine & Rehabilitation

## 2015-04-05 DIAGNOSIS — F329 Major depressive disorder, single episode, unspecified: Secondary | ICD-10-CM | POA: Insufficient documentation

## 2015-04-05 DIAGNOSIS — F32A Depression, unspecified: Secondary | ICD-10-CM | POA: Insufficient documentation

## 2015-04-13 NOTE — Consult Note (Signed)
ONCOLOGY followup note -having cough, dyspnea and hemoptysis. Had CT chest today which reprots possible pneumonia or pulmonary edema. Denies other bleeding symptoms. denies new bone pains. No fevers.alert and oriented, in no acute distress, on [place] O2           vitals - 99.2, 73, 20, 107/63, 89% on 2 L oxygen           lungs - b/l diminished breath sounds            abd - soft, nontender           ext - b/l pedal edema, minor bruising 6/23 - hemoglobin 7.1, platelets 43, WBC 4500, ANC 4100, creatinine 1.23. Serum PSA and CEA unremarkable. SIEP and random UIEP pending.  Bone scan: IMPRESSION:  The CT noted lesions within the pelvis do not demonstrate abnormal increased radiotracer uptake on bone scan. There is increased radiotracer uptake in the medial posterior left seventh rib correlating to an area of expansile sclerosis on prior CT scan. Although metastasis is not excluded based on this exam, the finding could be due to old posttraumatic change or bony dysplasia.   68/male with history of multiple medical problems, admitted with progressive low back pain and leg weakness. MRI lumbar spine shows critical spinal stenosis. However, scans have also picked up small lytic-appearing bone lesions in the iliac bones, (?) metastatic malignancy versus other etiology. 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  Patient has undergone further workup to look for evidence of malignancy, serum PSA and CEA unremarkable. CT scan of the chest/abdomen/pelvis with contrast does not reveal any obvious primary tumor or metastatic malignancy. Bone scan also does not show increased activity to suggest metastatic disease. SIEP and random UIEP are pending, if these are negative for M-spike then he does not need any further oncology workup at this time. 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 He also has evidence of iron deficiency, will increase oral iron to 3 times a day dosing. Recommend monitoring hemoglobin and iron studies at 10-12 weeks and refer back to Hematology if he has persistent iron deficiency anemia. Otherwise will sign off case at this time, please re-consult Hematology if needed. Patient explained above, agreeable to this plan.   Electronic Signatures: Jonn Shingles (MD)  (Signed on 24-Jun-15 19:36)  Authored  Last Updated: 24-Jun-15 19:36 by Jonn Shingles (MD)

## 2015-04-13 NOTE — Consult Note (Signed)
PATIENT NAME:  [name], [name] MR#:  [id number] DATE OF BIRTH:  July 04, 1946  DATE OF CONSULTATION:  06/16/2014  REFERRING PHYSICIAN:   CONSULTING PHYSICIAN:  Markhi Kleckner K. Franchot Mimes, MD  SEX: Male.  RACE: White.  SUBJECTIVE: The patient was seen in consultation in room #107 at [facility] [place], [place], [place]. The patient is a 69 year old white male who is not employed and is on disability for multiple physical problems which include blindness and pain secondary to diabetes mellitus and rheumatoid arthritis. The patient is married for many years and reports that he has a stable and good marriage.  PAST PSYCHIATRIC HISTORY: No previous history of inpatient hold on psychiatry. No history of suicide attempts. Not being followed by any psychiatrist. According to information obtained from the charts and from the staff, the patient became agitated and had something like a psychotic episode the night before when he got a telephone call and they suspect the telephone call is from his wife. The patient absolutely denies the same. He reports that he does not remember the telephone call and he reports that he has a very good marriage. When he was asked the reason why he got so upset with behavioral disturbances, he stated that he had lots of pain his neck and his back and his rheumatoid arthritis causes it and when this happens, he gets very upset and he does not know what to do but to yell and scream.   ALCOHOL AND DRUGS: Denied.  MENTAL STATUS EXAMINATION: The patient is alert but is confused about the dates. He said it was May and then he said June, and then he said it something in 56s and then he said 2016. He knew he was [facility] in [place], [place]. He said the capital of [place] was probably [place], which was wrong. He knew the President lived in [place], [place], and then said, "Could it be [place]?" Denies feeling depressed. Denies feeling hopeless or helpless.  Absolutely denies any ideas of trying to hurt himself or others. Denies auditory or visual hallucinations. Denies hearing voices saying things. Denies paranoid or suspicious ideas. Regarding judgment for fire, he said he would try to stop it. Insight and judgment guarded. Impulse control is fair.   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 Rule out delirium secondary to medical illness, the diabetes, and complications from the same. Recommend Haldol 1 mg and Ativan 1 mg p.o. or IM q.4 h. p.r.n. for agitation to help the patient calm down.    ____________________________ Wallace Cullens. Franchot Mimes, MD skc:lm D: 06/16/2014 20:08:38 ET T: 06/17/2014 04:49:07 ET JOB#: [id number]  cc: Arlyn Leak K. Franchot Mimes, MD, <Dictator> Dewain Penning MD ELECTRONICALLY SIGNED 06/17/2014 17:45

## 2015-04-13 NOTE — H&P (Signed)
PATIENT NAME:  [name], [name] MR#:  [id number] DATE OF BIRTH:  [birth year]  DATE OF ADMISSION:  06/11/2014  PRIMARY CARE PHYSICIAN: Adrian Prows, MD  PRIMARY HEMATOLOGIST: Leia Alf, MD  REFERRING EMERGENCY ROOM PHYSICIAN: Delman Kitten, MD  CHIEF COMPLAINT: Unable to walk.  HISTORY OF PRESENT ILLNESS: This is a 69 year old male who has multiple medical issues including diabetes, legally blind, coronary artery disease, transient ischemic attacks in the past, chronic anemia, nonalcoholic steatohepatitis, cataract, and hyperlipidemia. As per him, he usually feels a little weak and walks slow. Today, at 4:30 in the morning, he woke up to go to the bathroom. He felt extremely weak in both legs and could not walk. He was able to move his legs a little bit, but still not strong enough to walk to the bathroom and so decided to come to the Emergency Room. When he came to the Emergency Room, CT scan of the lumbar spine was done which showed moderate to severe narrowing at intervertebral space and there were some lytic lesions in both iliac bones. The ER physician spoke to neurosurgery at [facility], and he suggested this is not being an emergency or urgency just admit to hospital medical team and do neurology consult and further management or diagnostics for possible malignancy. So given to hospitalist team. On further questioning, the patient agrees that yes he has some numbness in both lower limbs and he has chronic neuropathy and pain in his toes and other joints and shoulder.   REVIEW OF SYSTEMS: CONSTITUTIONAL: Negative for fatigue, fever. Positive for weakness in both lower limbs. No weight loss or weight gain.  EYES: No blurring or double vision, discharge or redness.  EARS, NOSE, THROAT: No tinnitus, ear pain or hearing loss.  RESPIRATORY: No cough, wheezing, hemoptysis, or shortness of breath.  CARDIOVASCULAR: No chest pain, orthopnea, edema, arrhythmia, palpitation.  GASTROINTESTINAL: No  nausea, vomiting, diarrhea, abdominal pain.  GENITOURINARY: No dysuria, hematuria, or increased frequency.  ENDOCRINE: No heat or cold intolerance. No excessive sweating.  SKIN: No acne, rashes, or lesions.  MUSCULOSKELETAL: No pain or swelling in the joints.  NEUROLOGICAL: The patient has numbness and weakness in both lower limbs. No tremor or vertigo or headache.  PSYCHIATRIC: No anxiety, insomnia, bipolar disorder.   PAST MEDICAL HISTORY: 1.  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 2.  Legally blind.  3.  Microalbuminuria from diabetes. 4.  Coronary artery disease, status post PCI with stent placement in 2003.  5.  Cerebrovascular disease, status post recurrent transient ischemic attacks. 6.  Gout. 7.  Iron deficiency anemia.  8.  Chronic obstructive pulmonary disease.  9.  Hyperlipidemia.  10.  Depression.  11.  Vitamin E deficiency.  12.  Glaucoma.  13.  Cataract. 14.  Non-alcoholic steatohepatitis. 15.  Gastroesophageal reflux disease.  16.  Hiatal hernia.   SOCIAL HISTORY: Lives at home with his wife. No alcohol or recreational drug use. History of smoking, but he quit 30 years ago since. Since 2003 he is disabled because of blindness, but in the past he used to work in [employer].  FAMILY HISTORY: Positive for cancer in his 2 sisters, 1 had brain and breast cancer, the other had kidney cancer. The patient himself is not diagnosed with any cancer, but he did not have colonoscopy screening.   HOME MEDICATIONS: 1.  Xifaxan 550 mg oral tablet 2 times a day.  2.  Omeprazole 20 mg oral 2 times a day. 3.  NovoLog insulin as sliding  scale coverage.  4.  Lantus 90 units subcutaneous once a day.  5.  Ferrous sulfate 325 mg oral 3 times a day.  6.  Atenolol 50 mg oral 3 tablets once a day.  7.  Amlodipine 10 mg oral once a day.  8.  Allopurinol 100 mg oral 2 times a day.  PHYSICAL EXAMINATION: VITAL SIGNS: In the ER, temperature 97.9, pulse 75, respiration 18, blood  pressure 149/65, and pulse ox is 98 in room air.  GENERAL: The patient is fully alert and oriented to time, place, and person. Does not appear in any acute distress. He is obese.  HEENT: Head and neck atraumatic. Conjunctiva pink. Oral mucosa moist.  NECK: Supple. No JVD.  RESPIRATORY: Bilateral equal and clear air entry.  CARDIOVASCULAR: S1, S2 present, regular. No murmur.  ABDOMEN: Soft. Appears distended, but as per the patient this is his normal. There are some scars of surgery present and palpable some lumps, likely stool. Bowel sounds normal.  SKIN: No acne, rashes, or lesions.  MUSCULOSKELETAL: No tenderness or swelling.  NEUROLOGICAL: Has power 3/5 in both lower limbs, but appears to be slow on movement. No tremors or rigidity. Upper limbs power reserved. PSYCHIATRIC: No anxiety, insomnia, bipolar disorder.  DIAGNOSTIC DATA: CT lumbar spine: Lytic lesions in bilateral iliac bones, measured up to 10 mm, lumbar spondylosis, resulting moderate to severe canal stenosis at L4 and L5 levels, multilevel severe neural foraminal narrowing.   CT head without contrast is done today. No acute intracranial abnormality.  Glucose 90, BUN 14, creatinine 0.87, sodium 140, potassium 4.1, chloride 111, CO2 21, osmolality 279, and calcium is 8.3.   Total protein 7.5, albumin 3.1, bilirubin 0.5, alkaline 114, SGOT 52, and SGPT 32.   Troponin less than 0.02.   WBC 4.2, hemoglobin 7.1, platelet count 55,000, and MCV is 101.  Urinalysis is grossly negative.   ASSESSMENT AND PLAN: This is a 69 year old male with multiple past medical history who was gradually progressively getting worse today morning, could not walk properly, came to the Emergency Room and found having multiple narrowing of neural foramina and some degenerative disease plus lytic lesions in iliac bones.  1.  Inability to walk. Mostly this is due to neural or nerve root compressions as seen by CT lumbar spine. I will get neurology consult  and get MRI of lumbar spine with contrast. We will give him steroids IV meanwhile and get physical therapy evaluation. ER physician already spoke to neurosurgery at [facility], Dr. Sherwood Gambler, and he suggested no acute surgical interventions and manage medically.  2.  Lytic lesions in iliac bones as found by CT of lumbar spine. We will do MRI of pelvis with contrast to get further evaluation of these lesions, and we will call oncology consult to get further diagnostic studies for his cancer risk. 3.  Diabetes. He is taking very high dose of Lantus daily with NovoLog sliding scale. I will cut down his long acting insulin to Levemir 40 units at night and will put him on sliding coverage.  4.  Hypertension. We will continue atenolol and amlodipine.  5.  Nonalcoholic steatohepatitis. He is on Xifaxan. Will continue the same.  6.  Coronary artery disease history. The patient had gastrointestinal bleeding issues and so he is not on aspirin or any antiplatelet agent. We will just continue beta blockers and statin. 7.  Chronic anemia. Following with hematologist in office and on iron oral supplementation therapy. We will continue the same.   CODE  STATUS: FULL.  TOTAL TIME SPENT ON THIS ADMISSION: 50 minutes. ____________________________ Ceasar Lund Anselm Jungling, MD vgv:sb D: 06/11/2014 08:51:58 ET T: 06/11/2014 09:39:49 ET JOB#: [id number]  cc: Ceasar Lund. Anselm Jungling, MD, <Dictator> Cheral Marker. Ola Spurr, MD Sandeep R. Ma Hillock, MD Vaughan Basta MD ELECTRONICALLY SIGNED 06/16/2014 20:15

## 2015-04-13 NOTE — Discharge Summary (Signed)
PATIENT NAME:  [name], [name] MR#:  [id number] DATE OF BIRTH:  [birth year]  DATE OF ADMISSION:  10/26/2014 DATE OF DISCHARGE:  10/30/2014  ADMITTING PHYSICIAN: Aaron Mose. Hower, [name]   DISCHARGING PHYSICIAN: Gladstone Lighter, [name]  PRIMARY CARE PHYSICIAN: Luke Marker. Ola Spurr, [name]  [place]: Gastroenterology consultation with Luke Dawn. Candace Cruise, [name].  PRIMARY ONCOLOGIST: Luke Bannister. Ma Hillock, [name].   DISCHARGE DIAGNOSES:  1.  Acute on chronic anemia requiring 2 units of packed red blood cell transfusion this hospitalization.  2.  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  3.  Hypertension.  4.  Legal blindness.  5.  Nonalcoholic steatohepatitis.   6.  Chronic thrombocytopenia.   DISCHARGE HOME MEDICATIONS:  1.  NovoLog sliding scale insulin with meals.  2.  Allopurinol 100 mg p.o. b.i.d.  3.  Sertraline 50 mg p.o. daily.  4.  Tramadol 50 mg p.o. q. 8 hours p.r.n. for pain.  5.  Cholestyramine 4 g in liquid 3 times a day.  6.  Protonix 40 mg p.o. b.i.d.  7.  Xifaxan 550 mg p.o. twice a day.  8.  Lactulose 30 mL once a day.  9.  Ferrous sulfate 325 mg 3 times a day with meals.  10.  Norvasc 10 mg p.o. daily.  11.  Nadolol 20 mg p.o. daily.  12.  Glargine insulin 40 units subcutaneous at bedtime.   DISCHARGE DIET: Low-sodium diet.   DISCHARGE ACTIVITY: As tolerated.   FOLLOWUP INSTRUCTIONS: 1.  PCP follow-up in 2 weeks.  2.  GI followup in 2-3 weeks.  3.  Follow up with Dr. Ma [name] in 3-4 weeks.  4.  Resume home health services.   LABORATORIES AND IMAGING STUDIES PRIOR TO DISCHARGE: WBC 3.7, hemoglobin 8.0, hematocrit 24.1, platelet count 52,000. Sodium 141, potassium 3.8, chloride 111, bicarbonate 22, BUN 11, creatinine 0.94, glucose 158, and calcium of 7.8. BNP was only slightly elevated at 381. ALT 27, AST 40, alkaline phosphatase 118, total bilirubin 0.6, albumin of 2.9, lipase 147 on  admission. Chest x-ray on admission showing clear lung fields. No acute cardiopulmonary process. Hemoccult was positive for stools.   BRIEF HOSPITAL COURSE: Mr. Andrepont is a 69 year old Caucasian male with a past medical history significant for nonalcoholic steatohepatitis, diabetes, coronary artery disease, COPD, diabetic neuropathy and retinopathy and also legal blindness, who presents to the hospital secondary to symptomatic anemia and generalized weakness. The patient has: 1.  Acute on chronic anemia. The patient follows with Dr. Ma [name] for his chronic anemia. All of his serological tests have been negative to find a cause for his iron deficiency anemia. He has not had gastrointestinal workup done in the past. His hemoglobin seemed to be around 6.3 on admission. Dr. Ma [name] was trying to keep his hemoglobin greater than 7 with infusions at the [facility] as needed. The patient's Hemoccult was positive. He received immediately 1 unit of transfusion on admission. Hemoglobin went up to 7.5, but it dropped back to 7, so he received a second unit and hemoglobin was stable at 8. 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 According to Dr. Candace [name], the patient needs to be on PPIs from now on and have outpatient followup and might need further treatments for his vascular ectasia, which could be the cause for his chronic anemia. He is on iron supplements and also will follow up with GI and also  oncology as an outpatient.  2.  Hypertension. The patient is on Norvasc and nadolol has also been added for his cirrhosis.  3.  NASH, nonalcoholic steatohepatitis. The patient is on Xifaxan and also lactulose. Stable at this point.  4.  Diabetes mellitus on NovoLog and also glargine insulin.  5.  His code status is Full Code.   The course has been otherwise uneventful in the hospital.   DISCHARGE CONDITION: Stable.   DISCHARGE DISPOSITION: Home  with home health.   TIME SPENT ON DISCHARGE: 45 minutes    ____________________________ Gladstone Lighter, [name] rk:MT D: 10/30/2014 13:53:42 ET T: 10/30/2014 15:25:48 ET JOB#: [id number]  cc: Gladstone Lighter, [name], <Dictator> Manya Silvas, [name] Sandeep R. Ma Hillock, [name] Luke Marker. Ola Spurr, [name]  Gladstone Lighter [name] ELECTRONICALLY SIGNED 11/10/2014 14:47

## 2015-04-13 NOTE — Discharge Summary (Signed)
PATIENT NAME:  [name], [name] MR#:  [id number] DATE OF BIRTH:  11-30-1946  DATE OF ADMISSION:  06/11/2014 DATE OF DISCHARGE:  06/18/2014  CONSULTING PHYSICIANS: Dr. Gaylyn Cheers for gastrointestinal, Dr. Franchot Mimes for psychiatry, Dr. Valora Corporal for neurology, Dr. Mortimer Fries for pulmonary, Dr. Ma Hillock for hematology oncology.   HISTORY OF PRESENT ILLNESS: This is a pleasant 69 year old gentleman, initially admitted with inability to walk and weakness progressive over several weeks, as well as back pain. He has a known history of cirrhosis, coronary artery disease, transient ischemic attacks, diabetes, chronic anemia and blindness. He had imaging in the ER, which showed severe spinal lumbar stenosis. He also had imaging showing evidence of possible lytic lesions in his spine. He was admitted to the medical service after the ER consulted with neurosurgery, who recommended follow up with down following initial stability.   HOSPITAL COURSE BY ISSUE:  1.  Lumbar spinal stenosis. The patient was seen by neurology, who agreed he needed neurosurgical evaluation. He clinically did respond somewhat to steroids, however, and was able to get up and stand. His back pain improved somewhat, and he had no focal neurological deficits. After further discussion with Dr. Eilleen Kempf of neurosurgery, it was agreed he would follow up as an outpatient.  2.  Lytic lesions. He did have SPEP and UPEP done, and was seen by Dr. Ma Hillock. CT of the chest, abdomen and pelvis did not show any obvious a focal source of malignancy. Bone scan did not show any increased activity to suggest metastatic disease. He had relatively stable labs. On his CBC on admission, when Dr. Ma Hillock increased his oral iron to 3 times a day. He will follow up as an outpatient with Dr. Ma Hillock.  3.  Acute respiratory failure, likely due to alveolar hemorrhage or aspiration pneumonia. Several days into the hospital admission, the patient developed hemoptysis. Chest x-ray  and then CT showed multifocal airspace disease. This was felt, given his hemoptysis to potentially be some alveolar hemorrhage with his hemoptysis. He was transferred to the Intensive Care Unit, treated with steroids, nebulizers and levofloxacin diuresis. Clinically improved and was able to come off of oxygen and transfer to the floor.  4.  Delirium. The patient became somewhat agitated and altered, likely due to high-dose steroids. He was seen by psychiatry. This improved clinically as he was weaned to lower dose of steroids. 5.  Diabetes. His sugars initially ran very high with high-dose steroids, but were controlled with sliding scale insulin.   DISCHARGE MEDICATIONS: Please see [facility] physician discharge instructions.   DISCHARGE FOLLOW-UP: The patient will follow up with neurosurgery clinic at [facility] in 1 to 2 weeks. He will follow up with Dr. Ola Spurr within 1 to 2 weeks. He was discharged home with home health, physical therapy and nursing.   DISCHARGE DIET: Regular consistency, a cardiac ADA 1800 diet.   TIME SPENT: This discharge took 40 minutes.    ____________________________ Cheral Marker. Ola Spurr, MD dpf:cg D: 06/25/2014 22:21:44 ET T: 06/26/2014 07:13:20 ET JOB#: [id number]  cc: Cheral Marker. Ola Spurr, MD, <Dictator> [name] Ola Spurr MD ELECTRONICALLY SIGNED 07/09/2014 21:11

## 2015-04-13 NOTE — Consult Note (Signed)
CT scan does not show evidence of primary tumor or other metastatic malignancy. PSA, SIEP, CEA pending. Will get Bone scan to evaluate bone lesions further.  Electronic Signatures: Jonn Shingles (MD)  (Signed on 23-Jun-15 20:40)  Authored  Last Updated: 23-Jun-15 20:40 by Jonn Shingles (MD)

## 2015-04-13 NOTE — Consult Note (Signed)
PATIENT NAME:  [name], [name] MR#:  [id number] DATE OF BIRTH:  [birth year]  DATE OF CONSULTATION:  10/27/2014  REFERRING PHYSICIAN:   CONSULTING PHYSICIAN:  Lupita Dawn. Barry Culverhouse, MD  REASON FOR REFERRAL: Symptomatic anemia.   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 There is no chest pain or palpitations. The patient has chronic anemia for which he is followed by Dr. Ma Hillock. It is felt to be related to his chronic liver disease. He is also on chronic iron, as well. When he presented to the Emergency Room, he was found to have a hemoglobin of only 6.3. Therefore, the patient was given 1 unit of blood and then admitted for further evaluation.   The patient denies seeing any gross hematochezia or melena. There is no nausea, vomiting, or abdominal pain or change in his bowel habits recently.   The patient was recently admitted in June of this year for weakness to the point that he could not walk. There was some question of acute delirium. Dr. Vira Agar evaluated the patient and felt that there was no evidence of acute hepatic encephalopathy. The patient also had a bout of hemoptysis in the hospital, as well. The patient has been followed by either Dr. Vira Agar or Dawson Bills at the GI office for the past few years.   The patient also had a gastroscopy and colonoscopy in July 2013 for anemia and heme-positive stool. At the time, colonoscopy showed multiple polyps, which were all benign. There were 5 polyps that had to be removed. He also had internal hemorrhoids, as well as some prominent submucosal veins in the colon. Upper endoscopy showed moderate inflammation and erythema involving the antrum of the stomach.   REVIEW OF SYSTEMS: There are no changes in the admission review of symptoms. Please refer to Dr.  Lyman Speller dictation for information.   PAST MEDICAL HISTORY: Notable for history of  TIA and coronary artery disease, COPD, diabetes and liver cirrhosis. The patient is legally blind.   SOCIAL HISTORY: There is a remote history of tobacco use. There is no alcohol use.   FAMILY HISTORY: Notable for various cancers.   ALLERGIES: INCLUDE ASPIRIN, HYDROCHLOROTHIAZIDE, PENICILLIN, NAPROSYN, AMINOPHYLLIN.   HOME MEDICATIONS: Include allopurinol, Norvasc, atenolol, Questran, enalapril, iron, insulin, Prilosec twice a day, sertraline, tramadol and Xifaxan.   PHYSICAL EXAMINATION:  GENERAL: The patient is in no acute distress.  VITAL SIGNS: He is afebrile.  CARDIAC: Revealed regular rhythm and rate.  LUNGS: Clear bilaterally.  ABDOMEN: Showed normoactive bowel sounds, soft. It was nontender. There is a slight distention of the abdomen. There is no hepatomegaly.  EXTREMITIES: No clubbing or cyanosis. There is a slight edema in the extremities.  NEUROLOGIC: Examination is nonfocal.  SKIN: Negative.   LABORATORY DATA: BNP was 381, glucose 158. The rest of the electrolytes were normal. Lipase 147, bilirubin 0.6, alkaline phosphatase 118, AST 40, ALT 27. CPK and troponin levels were normal. Hemoglobin was 6.3. White count 3.7, platelet count is 56,000. Hemoglobin went up to 7.1 with a blood transfusion.   IMPRESSION: This is a patient with chronic anemia, probably related to his liver cirrhosis. The patient is anemic. It is not significantly lower than what he was in June when his hemoglobin was only 7.1. I agree with a blood transfusion. I recommend that we check his stool for blood,  since the patient is unaware of seeing any gross hematochezia or melena. If the stool is positive, I would consider repeating the upper endoscopy first since he does have a history of gastritis. 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 I will consider doing an upper endoscopy for him by Monday if his hemoglobin continues to drop. If the upper endoscopy is completely negative, we could repeat the colonoscopy. On the other hand, if the patient does well with a blood transfusion and hemoglobin stays stable, then he can be discharged sooner and follow up with Dr. Vira Agar and Dawson Bills in the office.   Thank you for the referral.    ____________________________ Lupita Dawn. Candace Cruise, MD pyo:JT D: 10/28/2014 08:35:53 ET T: 10/28/2014 09:29:23 ET JOB#: [id number]  cc: Lupita Dawn. Candace Cruise, MD, <Dictator> Lupita Dawn Ravindra Baranek MD ELECTRONICALLY SIGNED 10/28/2014 11:14

## 2015-04-13 NOTE — Consult Note (Signed)
PATIENT NAME:  [name], [name] MR#:  [id number] DATE OF BIRTH:  10/31/1946  DATE OF CONSULTATION:  06/16/2014  REFERRING PHYSICIAN:   CONSULTING PHYSICIAN:  Manya Silvas, MD  HISTORY OF PRESENT ILLNESS:  The patient is a 69 year old white male with multiple medical problems well outlined in other notes in the chart.  He awoke feeling extremely weak in both legs and could not walk. CT scan in the ER showed moderate to severe narrowing at the L4-L5, and some lytic lesions in the iliac bones. He was admitted to the hospital for treatment and for further evaluation of the lytic areas. Patient has a history of nonalcoholic steatohepatitis and cirrhosis, with elevated ammonia level. He is on Xifaxan and lactulose for this. His ammonia level was only slightly elevated at 34.   Since admission, he had a spell of hemoptysis followed by abnormal chest x-rays. He also had a spell of acute agitated delirium, which has subsided. Question was raised about this possibly being related to hepatic encephalopathy.   A chest x-ray showed bilateral patchy airspace disease, either edema or pneumonia, possibly early aspiration pneumonia.   REVIEW OF SYSTEMS: He denies nausea or vomiting. No rectal bleeding. His bowels are loose.   SOCIAL HISTORY: History of smoking, quit 30 years ago.   PAST MEDICAL HISTORY: 1.  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 He can barely see.  2.  Microalbuminemia.  3.  Coronary artery disease, status post stent placement in 2003.  4.  Cerebrovascular disease, previous transient ischemic attack.  5.  Gout.  6.  Iron deficiency anemia, partly due to chronic disease.  7.  COPD.  8.  Hyperlipidemia.  9.  Depression.  10.  Glaucoma.  11.  Cataracts.  12.  Nonalcoholic steatohepatitis/cirrhosis. 13.  GERD.  14.  Hiatal hernia.  FAMILY HISTORY: Positive for cancer in two sisters.  Patient did not have a screening colonoscopy.  HOME MEDICATIONS:  1.   Xifaxan 550 mg 2 times a day. 2.  Omeprazole 20 mg 2 times a day. 3.  NovoLog insulin sliding scale coverage. 4.  Lantus 90 units subcutaneous daily.  5.  Ferrous sulfate 325 one 3 times a day.  6.  Atenolol 50 mg 3 tablets a day.  7.  Amlodipine 10 mg a day.  8.  Allopurinol 100 mg 2 times a day. 9.  Lactulose.   HOSPITAL COURSE: The patient had CT of the lumbar spine showing severe neuroforaminal  narrowing. CT of the head was done and showed no acute intracranial abnormalities. White count 4.2, hemoglobin 7.1, platelet count 55,000. Total protein 7.5, albumin 3.1, total bilirubin 0.5, alkaline phosphatase 114, SGOT 52, SGPT 32, BUN 14, creatinine 0.87.   PHYSICAL EXAMINATION:  GENERAL: Pleasant white male no unable to see very well.  NEUROLOGIC: Able to walk a few steps in the room at the time of the exam, where he could not walk when admitted. The patient was oriented.  Knew he was in the hospital, knew they had been here about 5 days. He thought the month was May, and he could not remember what year it was.  He did know the president was Obama. He remembered me and remembered seeing me in the past. Exam shows when he has his arms out and his palms bent backwards, there was no flap.   ASSESSMENT/PLAN: He did not show any signs of hepatic encephalopathy.  His acute agitation likely was steroid induced, possibly anxiety attack also because of  his multiple medical problems and his hospitalization and multiple illnesses. His lactulose has been increased to a very high dose. This is probably higher than necessary and could possibly cause diarrhea to the point of being too excessive. I will reduce his dose to 3 times a day instead of 4 times a day.  The  altered mental status of elevated ammonia in patients with cirrhosis is more of a lethargic rather than an agitated condition. Therefore, I do not think his ammonia level or cirrhosis has any role in his recent problems. I will follow with you     ____________________________ Manya Silvas, MD rte:ts D: 06/16/2014 14:39:13 ET T: 06/16/2014 15:54:36 ET JOB#: [id number]  cc: Manya Silvas, MD, <Dictator> Cheral Marker. Ola Spurr, MD Sandeep R. Ma Hillock, MD Manya Silvas MD ELECTRONICALLY SIGNED 07/03/2014 17:40

## 2015-04-13 NOTE — Consult Note (Signed)
PATIENT NAME:  [name], [name] MR#:  [id number] DATE OF BIRTH:  04/30/1946  DATE OF CONSULTATION:  06/11/2014  CONSULTING PHYSICIAN:  Severa Jeremiah R. Ma Hillock, MD  REFERRING PHYSICIAN: Dr. Anselm Jungling.   REASON FOR CONSULTATION: Lytic lesions in the iliac bones.   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 The patient has been admitted to the hospital with progressive chronic low back pain and weakness of lower extremity with paresthesias going to the right lower extremity. He had a radiological workup done, which shows small lytic lesions in the iliac bones on CT scan of the lumbar spine without contrast. He therefore had MRI of the pelvis, which reported subtle small enhancing lesions in the iliac bones bilaterally and further evaluation recommended. MRI of the lumbar spine also shows critical spinal stenosis at L4-L5 due to central disk protrusion and slight heterogeneity of the bone marrow. Concern for myeloma has been raised. 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   PAST MEDICAL HISTORY AND SURGICAL HISTORY: As in history of present illness above.   FAMILY HISTORY: Noncontributory.   SOCIAL HISTORY: Denies smoking, alcohol or recreational drug usage. Difficulty ambulating due to ongoing low back pain and weakness issues.   HOME MEDICATIONS: Omeprazole 20 mg b.i.d., NovoLog insulin SSI, Lantus insulin 90 units subcutaneous daily, ferrous sulfate 325 mg p.o. t.i.d., Xifaxan 550 mg b.i.d., atenolol 50 mg 3 tablets once daily, amlodipine 10 mg daily,  allopurinol 100 mg b.i.d.   REVIEW OF SYSTEMS:  CONSTITUTIONAL: As in history of present illness. No fevers, chills, or night sweats.  HEENT: Denies any headaches or dizziness at rest. No epistaxis, ear, or jaw pain.  CARDIAC: Currently denies angina, palpitation, orthopnea, or PND.  LUNGS: Has mild dyspnea on exertion which is chronic. No new cough, hemoptysis, or chest pain.  GASTROINTESTINAL: No nausea or vomiting. No diarrhea or blood in stools.  GENITOURINARY: No dysuria or hematuria.  SKIN: No new rashes or pruritus.  HEMATOLOGIC: No bleeding issues.  MUSCULOSKELETAL: As in history of present illness. No new bone pains otherwise.  NEUROLOGIC: No seizures or loss of consciousness.  ENDOCRINE: No polyuria or polydipsia. Appetite is good, denies unintentional weight loss.   PHYSICAL EXAMINATION:  GENERAL: Patient is a moderately built, well-nourished individual, resting in bed, alert and oriented and converses appropriately. No acute distress. No icterus.  VITAL SIGNS: Temperature 97.7, pulse rate 67, respiratory rate 19, blood pressure 125/66, oxygen saturation 95% on room air.  HEENT: Normocephalic, atraumatic. Extraocular movements intact. Sclerae anicteric. No oral thrush.  NECK: Negative for lymphadenopathy.  CARDIOVASCULAR: S1, S2, regular rhythm.  LUNGS: Show bilateral diminished breath sounds, especially at bases, no rhonchi noted.  ABDOMEN: Soft, obese. No splenomegaly or masses palpable clinically.  EXTREMITIES: Trace edema.  SKIN: Minor bruising.  LYMPHATICS: No adenopathy in the axillary or inguinal areas.   LABORATORY RESULTS: June 22nd: WBC 4200, hemoglobin 7.1, platelets 55,000, MCV 101, creatinine 0.87, calcium 8.3. LFT unremarkable except AST of 52 and low albumin of 3.1. Iron study from February was normal.   IMPRESSION AND RECOMMENDATIONS: A 69 year old gentleman with history of multiple medical problems, admitted with progressive low back pain and leg weakness.  MRI  lumbar spine shows critical spinal stenosis, which could be the reason for his symptoms. However, scans have also picked up small lytic-appearing bone lesions in the iliac bones, (?) metastatic malignancy versus other etiology. 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 The patient explained that we will need further workup to rule out plasma cell dyscrasia like myeloma and also rule out underlying occult malignancy. Plan therefore is to get serum immunofixation electrophoresis (SIEP). Serum kappa/lambda quantitative ratio to look for light chain disease, serum PSA, serum CEA for tumor markers and get a CT scan of the chest/abdomen/pelvis with contrast tomorrow if possible to look for occult malignancy or metastatic disease. The patient has progressive anemia, last iron study in February was unremarkable. We will get repeat iron study drawn tomorrow along with B12 and folate level to make sure he has not developed these deficiencies, otherwise he does have anemia of chronic liver disease. 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 We will follow up after results of workup are available and make further recommendations. The patient is agreeable to this plan.   Thank you for the referral. Please feel free to contact me if any additional questions.     ____________________________ Rhett Bannister Ma Hillock, MD srp:lt D: 06/11/2014 21:42:00 ET T: 06/11/2014 22:36:01 ET JOB#: [id number]  cc: Trevor Iha R. Ma Hillock, MD, <Dictator> Alveta Heimlich MD ELECTRONICALLY SIGNED 06/13/2014 15:51

## 2015-04-13 NOTE — Consult Note (Signed)
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 Antrum cauterized with APC. Can resume diet. Protonix bid. Start nadolol or inderal. Will likely need few more sessions in the future. Plan Barryx in 1 month or so. Thanks.  Electronic Signatures: Verdie Shire (MD)  (Signed on 09-Nov-15 12:28)  Authored  Last Updated: 09-Nov-15 12:28 by Verdie Shire (MD)

## 2015-04-13 NOTE — Consult Note (Signed)
Pt seen and examined. Full consult to follow. Pt of Dr. Secundino Ginger with hx of cirrhosis, ascies, hepatic encephalopathy, and chronic Fe def anemia. On Fe at home. No recent rectal bleeding but hgb down to 6.1 on admission. Hgb around 7 range last time patient in hospital in June. Had both EGD and colonscopy in 2013. Had multiple polyps removed. Some tics seen. EGD showed either gastritis or watermelon stomach by description. Would transfuse patient as needed. Check stool for blood. IF positive, may plan EGD on Monday. IF EGD negative and hgb keeps falling with heme positive stool, then consider repeating colonoscopy later. IF stool negative for blood, then transfusion, Fe, and daily PPI would be adequate. If patient to be discharged soon, make sure patient f/u with Dr. Hervey Ard. Jerelene Redden in GI office. Thanks.  Electronic Signatures: Verdie Shire (MD)  (Signed on 07-Nov-15 09:59)  Authored  Last Updated: 07-Nov-15 09:59 by Verdie Shire (MD)

## 2015-04-13 NOTE — Consult Note (Signed)
Chief Complaint:  Subjective/Chief Complaint Pt was sleeping. No complaints this AM. Hgb drifting downwards. Stool heme positive.   VITAL SIGNS/ANCILLARY NOTES: **Vital Signs.:   08-Nov-15 08:01  Pulse Pulse 75  Respirations Respirations 18  Systolic BP Systolic BP 161  Diastolic BP (mmHg) Diastolic BP (mmHg) 63  Mean BP 81   Brief Assessment:  GEN no acute distress   Cardiac Regular   Respiratory clear BS   Gastrointestinal distended with ascites. nontender   Lab Results: Routine Sero:  07-Nov-15 14:47   Occult Blood, Feces POSITIVE (Result(s) reported on 27 Oct 2014 at 03:23PM.)   Assessment/Plan:  Assessment/Plan:  Assessment Cirrhosis. Heme positive stool. Chronic anemia.   Plan Daily PPI. Plan EGD tomorrow. Thanks.   Electronic Signatures: Verdie Shire (MD)  (Signed [phone] 10:34)  Authored: Chief Complaint, VITAL SIGNS/ANCILLARY NOTES, Brief Assessment, Lab Results, Assessment/Plan   Last Updated: 08-Nov-15 10:34 by Verdie Shire (MD)

## 2015-04-13 NOTE — H&P (Signed)
PATIENT NAME:  [name], [name] MR#:  [id number] DATE OF BIRTH:  [birth year]  DATE OF ADMISSION:  10/26/2014  REFERRING PHYSICIAN: Larae Grooms, MD  PRIMARY CARE PHYSICIAN: Cheral Marker. Ola Spurr, MD    CHIEF COMPLAINT: Weakness.  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 He describes 2-day duration of worsening weakness as well as dyspnea on exertion; however, denies any chest pain, palpitations, edema, orthopnea, or further symptomatology. Of note, he does follow with Dr. Ma Hillock for chronic anemia, likely secondary to his liver disease. Given his worsening symptoms, he decided to present to the hospital for further workup and evaluation, where he was found to be anemic with hemoglobin of 6.3. He has been ordered to receive 1 unit of packed red blood cell transfusion by the Emergency Department. He complains of no further symptoms at this time.  REVIEW OF SYSTEMS: CONSTITUTIONAL: He denies fevers, chills; however, positive for fatigue, weakness.  EYES: Denies blurred vision, double vision, eye pain.  EARS, NOSE, THROAT: Denies tinnitus, ear pain, hearing loss.  RESPIRATORY: Denies cough, wheeze, shortness of breath.  CARDIOVASCULAR: Denies chest pain, palpitations, edema.  GASTROINTESTINAL: Denies nausea, vomiting, abdominal pain. Denies any melena or hematochezia.  GENITOURINARY: Denies dysuria, hematuria.  ENDOCRINE: Denies nocturia or thyroid problems. HEMATOLOGY AND LYMPHATIC: Denies easy bruising and bleeding.  SKIN: Denies rashes or lesions.  MUSCULOSKELETAL: Denies pain in neck, back, shoulder, knees, hips, or arthritic symptoms.  NEUROLOGIC: Denies  paralysis, paresthesias.  PSYCHIATRIC: Denies anxiety or depressive symptoms.  Otherwise, full review of systems performed by me is negative.   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   SOCIAL HISTORY: Positive for remote tobacco use. Denies any alcohol or drug usage.   FAMILY HISTORY: There have been various cancers; however, denies any known cardiovascular or pulmonary disorders.   ALLERGIES: ACTOS, AMPICILLIN, ASPIRIN, HYDROCHLOROTHIAZIDE, NAPROSYN, PENICILLIN, AS WELL AS AMINOPHYLLINE.   HOME MEDICATIONS: Include allopurinol 100 mg p.o. b.i.d.; Norvasc 10 mg p.o. q. daily; atenolol 50 mg p.o. 3 times daily; cholestyramine 4 g 3 times daily; enalapril 10 mg p.o. q. daily; Feosol 325 mg p.o. 3 times daily; Lantus 80 units at bedtime; lactulose 10 g/15 mL, 30 mL once a day as needed for constipation; NovoLog sliding scale; Prilosec 20 mg p.o. b.i.d.; sertraline 50 mg p.o. q. daily; tramadol 50 mg p.o. q. 8 hours as needed for pain; Xifaxan 550 mg p.o. b.i.d.   PHYSICAL EXAMINATION:  VITAL SIGNS: Temperature 98, heart rate 77, respirations 18, blood pressure 120/64, saturating 97% on room air. Weight 91.6 kg, BMI of 34.7.  GENERAL: Well-nourished, well-developed, Caucasian male currently in no acute distress.  HEAD: Normocephalic, atraumatic.  EYES: Pupils equal, round, reactive to light. Extraocular muscles intact. No scleral icterus.  MOUTH: Poor dentition. No abscess noted. Moist mucosal membranes.  EARS, NOSE, THROAT: Clear without exudates. No external lesions.  NECK: Supple. No thyromegaly. No nodules. No JVD.  PULMONARY: Clear to auscultation bilaterally without wheezes, rales, or rhonchi. No use of accessory  muscles. Good respiratory rate.  CHEST: Nontender  to palpation.  CARDIOVASCULAR: S1, S2, regular rate and rhythm. No murmurs, rubs, or gallops. Trace pedal edema bilaterally. Pedal pulses 2+ bilaterally. GASTROINTESTINAL: Soft. Positive for distention as well as fluid wave; however, nontender. Positive bowel sounds. No hepatosplenomegaly.  MUSCULOSKELETAL: No swelling, clubbing. Positive for edema as described above. Range of motion full in all extremities.  NEUROLOGIC: Cranial nerves II through XII intact. No gross focal neurological deficits. Sensation intact. Reflexes intact.  SKIN: No ulceration, lesions, rash, cyanosis. Skin warm, dry. Turgor intact.  PSYCHIATRIC: Mood and affect within normal limits. Patient awake, alert, oriented x 3. Insight and judgment intact.   LABORATORY DATA: Sodium 141, potassium 3.8, chloride 101, bicarbonate of 22, BUN 11, creatinine 0.94, glucose of 158. Albumin of 2.9, alkaline phosphatase of 118, AST of 40; otherwise, LFTs within normal limits. WBC of 3.7, hemoglobin of 6.3, platelets of 56,000. Fecal occult test positive in the Emergency Department.   ASSESSMENT AND PLAN: A 69 year old gentleman with a history of nonalcoholic steatohepatitis and cirrhosis presenting with dyspnea on exertion and weakness which have been progressively worsening the last 2-day duration; found to be anemic.  1.  Symptomatic anemia. Found to have with chronic anemia at baseline. He, however, is once again symptomatic. Given his shortness of breath and weakness, he is to receive 1 unit of packed red blood cell transfusion as already ordered in the Emergency Department. We will trend CBCs q. 6 hours. Given his physical examination finding of fecal occult blood test positive, we will consult gastroenterology and nephrology. He follows with the [facility] gastroenterology group, Dr. Vira Agar, for most of his issues. Transfusion threshold will be hemoglobin less than 7 unless he is symptomatic.  2.   Type 2 diabetes, insulin-requiring, complicated by neuropathy and retinopathy. Continue with basal insulin as well as add insulin sliding scale with q. 6 hour Accu-Cheks.  3.  Coronary artery disease status post percutaneous coronary intervention and stent placement. Continue his home medications.  4.  Cirrhosis secondary to nonalcoholic steatohepatitis. Continue with lactulose and Xifaxan.  5.  Venous thromboembolism prophylaxis with sequential compression devices.  CODE STATUS: The patient is full code.   TIME SPENT: 45 minutes.    ____________________________ Aaron Mose. Meghann Landing, MD dkh:ST D: 10/27/2014 20:30:47 ET T: 10/27/2014 21:44:40 ET JOB#: [id number]  cc: Aaron Mose. Diogo Anne, MD, <Dictator> Jermiah Howton Woodfin Ganja MD ELECTRONICALLY SIGNED 10/28/2014 2:15

## 2015-04-13 NOTE — Consult Note (Signed)
Referring Physician:  Angelena Form :   Primary Care Physician:  Angelena Form : [facility], Internal Medicine, [address] [place], [place] [address], [phone]  Reason for Consult: Admit Date: 11-Jun-2014  Chief Complaint: inability to walk  Reason for Consult: unable to walk   History of Present Illness: History of Present Illness:   69 yo RHD M presents to [facility] secondary to inability to  walk.  Pt reports sitting down and then he was unable to get up and had to call EMS.  He reports that he has been having worsening weakness over the past 2-3 months.  He also reports back pain after sitting or standing too long.  He denies neck pain.  He does note that his L leg is weaker but no specific muscle.  He denies any numbness or tingling.  He denies any bladder/bowel incontinence.  He does not know about impotence.  ROS:  General weakness   HEENT no complaints   Lungs no complaints   Cardiac no complaints   GI no complaints   GU no complaints   Musculoskeletal back pain   Extremities no complaints   Skin no complaints   Neuro no complaints   Endocrine no complaints   Psych no complaints   Past Medical/Surgical Hx:  COPD:   neuropathy in legs:   "heart problems":   "spine problems":   Back Pain, Chronic:   restless leg syndrome:   Cirrhosis:   Hypercholesterolemia:   sleep apnea:   CVA/Stroke:   Depression:   hiatal hernia:   blind in right eye:   Gout:   Diabetes:   Hypertension:   "leg surgery":   "heart surgery":   Stent - Cardiac:   Past Medical/ Surgical Hx:  Past Medical History reviewed by me as above   Past Surgical History reviewed by me as above   Home Medications: Medication Instructions Last Modified Date/Time  amlodipine 10 mg oral tablet 1 tab(s) orally once a day  22-Jun-15 05:41  lantus 90 unit(s)  once a day (at bedtime) 22-Jun-15 05:41  omeprazole 20 mg oral delayed release capsule 1 cap(s)  orally 2 times a day 22-Jun-15 05:41  atenolol 50 mg oral tablet 3 tab(s) orally once a day 22-Jun-15 05:41  NovoLog 100 units/mL subcutaneous solution sliding scale 22-Jun-15 05:41  ferrous sulfate 325 mg tablet 1 tab(s) orally 3 times a day x 30 days  22-Jun-15 05:41  Xifaxan 550 mg oral tablet 1 tab(s) orally 2 times a day 22-Jun-15 05:41  allopurinol 100 mg oral tablet 1 tab(s) orally 2 times a day 22-Jun-15 05:41   Allergies:  Actos: Anaphylaxis  Naprosyn: GI Distress  Ampicillin: Hives  HCTZ: Other  Penicillin: Other  Aminophylline: Unknown  Aspirin: Unknown  Allergies:  Allergies as above   Social/Family History: Employment Status: unemployed  Living Arrangements: house  Social History: no tob, no EtOH, no illicits  Family History: no strokes, no seizures   Vital Signs: **Vital Signs.:   23-Jun-15 21:32  Vital Signs Type Routine  Temperature Temperature (F) 97.9  Celsius 36.6  Temperature Source oral  Pulse Pulse 68  Respirations Respirations 20  Systolic BP Systolic BP 226  Diastolic BP (mmHg) Diastolic BP (mmHg) 61  Mean BP 81  Pulse Ox % Pulse Ox % 92  Pulse Ox Activity Level  At rest  Oxygen Delivery Room Air/ 21 %   Physical Exam: General: overweight, NAD  HEENT: normocephalic, sclera nonicteric, oropharynx clear  Neck: supple,  no JVD, no bruits  Chest: CTA B, no wheezing, good movement  Cardiac: RRR, no murmurs, no edema, 2+ pulses  Extremities: no C/C/E, FROM   Neurologic Exam: Mental Status: alert and oriented x 3, normal speech and language, follows complex commands  Cranial Nerves: PERRLA, EOMI, nl VF, face symmetric, tongue midline, shoulder shrug equal  Motor Exam: 5/5 B except 4/5 L hip flexor and L biceps femoris, normal tone, no tremor, no atrophy, no fasciculations  Deep Tendon Reflexes: 2+/4 B except 0/4 B achilles, plantars downgoing B, no Hoffman  Sensory Exam: decreased pinpric along L L5 dermatome, mild decreased vibration   Coordination: F to N WNL, gait wide based   Lab Results: Thyroid:  23-Jun-15 03:52   Thyroid Stimulating Hormone 1.20 (0.45-4.50 (International Unit)  ----------------------- Pregnant patients have  different reference  ranges for TSH:  - - - - - - - - - -  Pregnant, first trimetser:  0.36 - 2.50 uIU/mL)  Hepatic:  22-Jun-15 05:29   Bilirubin, Total 0.5  Alkaline Phosphatase 114 (45-117 NOTE: New Reference Range 11/10/13)  SGPT (ALT) 32  SGOT (AST)  52  Total Protein, Serum 7.5  Albumin, Serum  3.1  Routine Chem:  [id number] 19:50   Folic Acid, Serum 93.2 (Result(s) reported on 12 Jun 2014 at 04:44AM.)  Iron Binding Capacity (TIBC) 374  Unbound Iron Binding Capacity 318  Iron, Serum  56  Iron Saturation 15 (Result(s) reported on 12 Jun 2014 at 06:22AM.)  Ferritin ([facility]) 37 (Result(s) reported on 12 Jun 2014 at 04:44AM.)  Glucose, Serum  317  BUN  28  Creatinine (comp) 1.23  Sodium, Serum 136  Potassium, Serum 4.5  Chloride, Serum 105  CO2, Serum 23  Calcium (Total), Serum 8.5  Anion Gap 8  Osmolality (calc) 290  eGFR (African American) >60  eGFR (Non-African American) >60 (eGFR values <96m/min/1.73 m2 may be an indication of chronic kidney disease (CKD). Calculated eGFR is useful in patients with stable renal function. The eGFR calculation will not be reliable in acutely ill patients when serum creatinine is changing rapidly. It is not useful in  patients on dialysis. The eGFR calculation may not be applicable to patients at the low and high extremes of body sizes, pregnant women, and vegetarians.)  Hemoglobin A1c ([facility])  < 3.5 (The American Diabetes Association recommends that a primary goal of therapy should be <7% and that physicians should reevaluate the treatment regimen in patients with HbA1c values consistently >8%.)  Cholesterol, Serum 157  Triglycerides, Serum 109  HDL (INHOUSE)  36  VLDL Cholesterol Calculated 22  LDL Cholesterol Calculated 99  (Result(s) reported on 12 Jun 2014 at 04:28AM.)  Cardiac:  22-Jun-15 05:29   Troponin I < 0.02 (0.00-0.05 0.05 ng/mL or less: NEGATIVE  Repeat testing in 3-6 hrs  if clinically indicated. >0.05 ng/mL: POTENTIAL  MYOCARDIAL INJURY. Repeat  testing in 3-6 hrs if  clinically indicated. NOTE: An increase or decrease  of 30% or more on serial  testing suggests a  clinically important change)  CK, Total 67 (39-308 NOTE: NEW REFERENCE RANGE  01/22/2014)  CPK-MB, Serum 1.6 (Result(s) reported on 11 Jun 2014 at 06:14AM.)  Routine UA:  22-Jun-15 07:34   Color (UA) Yellow  Clarity (UA) Clear  Glucose (UA) Negative  Bilirubin (UA) Negative  Ketones (UA) Negative  Specific Gravity (UA) 1.008  Blood (UA) Negative  pH (UA) 5.0  Protein (UA) Negative  Nitrite (UA) Negative  Leukocyte Esterase (UA) Negative (Result(s) reported on 11 Jun 2014 at 07:49AM.)  RBC (UA) 1 /HPF  WBC (UA) <1 /HPF  Bacteria (UA) NONE SEEN  Epithelial Cells (UA) 1 /HPF  Mucous (UA) PRESENT (Result(s) reported on 11 Jun 2014 at 07:49AM.)  Routine Hem:  23-Jun-15 03:52   Retic Count  4.52  Absolute Retic Count 0.0971 (Result(s) reported on 12 Jun 2014 at 04:44AM.)  WBC (CBC) 4.5  RBC (CBC)  2.13  Hemoglobin (CBC)  7.1  Hematocrit (CBC)  21.5  Platelet Count (CBC)  43  MCV  101  MCH 33.1  MCHC 32.8  RDW  18.1  Neutrophil % 91.1  Lymphocyte % 7.2  Monocyte % 1.4  Eosinophil % 0.2  Basophil % 0.1  Neutrophil # 4.1  Lymphocyte #  0.3  Monocyte #  0.1  Eosinophil # 0.0  Basophil # 0.0 (Result(s) reported on 12 Jun 2014 at 04:54AM.)   Radiology Results: CT:    22-Jun-15 06:03, CT Head Without Contrast  CT Head Without Contrast   REASON FOR EXAM:    weakness in legs bilat, off balance this AM  COMMENTS:       PROCEDURE: CT  - CT HEAD WITHOUT CONTRAST  - Jun 11 2014  6:03AM     CLINICAL DATA:  Bilateral leg numbness in weakness. Difficulty  moving legs.    EXAM:  CT HEAD WITHOUT  CONTRAST    TECHNIQUE:  Contiguous axial images were obtained from the base of the skull  through the vertex without intravenous contrast.  COMPARISON:  02/25/2013    FINDINGS:  Mild cerebral atrophy. No ventricular dilatation. No mass effect or  midline shift. No abnormal extra-axial fluid collections. Gray-white  matter junctions are distinct. Basal cisterns are not effaced. No  evidence of acute intracranial hemorrhage. No depressed skull  fractures. Visualized paranasal sinuses and mastoid air cells are  not opacified.     IMPRESSION:  No acute intracranial abnormalities.      Electronically Signed    By: Lucienne Capers M.D.    On: 06/11/2014 06:12         Verified By: Neale Burly, M.D.,   Radiology Impression: Radiology Impression: MRI of L spine personally reviewed by me and shows severe stenosis especially at L4-5   Impression/Recommendations: Recommendations:   labs reviewed by me notes reviewed by me   Severe spinal stenosis-  this is very symptomatic with weakness and pain even though it has been long standing L L5 radiculopathy-  most likely secondary to 1. and will likely worsen Mild peripheral neuropathy-  stable recommend immediate neurosurgical evaluation due to increased weakness change solumedrol to dexamethasone 43m q6h needs PT evaluation will follow  Electronic Signatures: SJamison Neighbor(MD)  (Signed 23-Jun-15 22:20)  Authored: REFERRING PHYSICIAN, Primary Care Physician, Consult, History of Present Illness, Review of Systems, PAST MEDICAL/SURGICAL HISTORY, HOME MEDICATIONS, ALLERGIES, Social/Family History, NURSING VITAL SIGNS, Physical Exam-, LAB RESULTS, RADIOLOGY RESULTS, Recommendations   Last Updated: 23-Jun-15 22:20 by SJamison Neighbor(MD)

## 2015-04-13 NOTE — Consult Note (Signed)
Pt eating lunch, abd obese and non tender, he has intermittant spells of confusion.  NH3 level not elevated to level that it would be a cause.  Glucose running high in 300's, could be due to steroids.  VSS afebrile.  No further GI imput today.  Electronic Signatures: Manya Silvas (MD)  (Signed on 28-Jun-15 12:50)  Authored  Last Updated: 28-Jun-15 12:50 by Manya Silvas (MD)

## 2015-04-13 NOTE — Consult Note (Signed)
Pt doing as well as expected.  No flap on hands dorsiflexed.  WBC 5.2, hgb 8.3, plt 35K.  Oriented to person and place.  No new suggestions, continue current lactulose and Xifaxan.    Electronic Signatures: Manya Silvas (MD)  (Signed on 29-Jun-15 15:46)  Authored  Last Updated: 29-Jun-15 15:46 by Manya Silvas (MD)

## 2015-04-13 NOTE — Consult Note (Signed)
See consult.  Pt spell not related to liver disease, likely steroids or outburst due to illness/hospitalization, pain, etc.  Liver induced altered mental status is lethargic type given the sedation/disorientation of elevated ammonia.  Given his hemoptysis and lytic lesions would consider CT of chest and possible bronchoscopy. His inability to walk likely due to the L4-5 critical stenosis but this is improved on steroids as he was able to walk a few steps in room today for me.  I would decrease his lactulose to his admission dose to prevent excessive diarrhea.  Usual goal is 2-3 stools a day.  I would continue his Xifaxan.  Electronic Signatures: Manya Silvas (MD)  (Signed on 27-Jun-15 14:31)  Authored  Last Updated: 27-Jun-15 14:31 by Manya Silvas (MD)

## 2015-04-14 NOTE — H&P (Signed)
PATIENT NAME:  [name], [name] MR#:  [id number] DATE OF BIRTH:  1946-02-15  DATE OF ADMISSION:  01/04/2012  PRIMARY CARE PHYSICIAN: Dr. Andrey Farmer, [facility]  ER PHYSICIAN: Dr. Renee Ramus  ADMITTING PHYSICIAN: Dr. Pearletha Furl   PRESENTING COMPLAINT: Vomiting blood.   HISTORY OF PRESENT ILLNESS: The patient is a 69 year old male who was brought to the hospital with complaint of intractable vomiting this evening.  The patient started vomiting about three hours prior to presenting to the Emergency Room. Vomiting was bloody in nature. He had about eight episodes of blood in vomiting.  He also stated that he had about six bowel movements today which were dark and tarry. Denies any abdominal pain. Admitted to being lightheaded on his feet but attributes this to his neuropathy. No syncope. No loss of consciousness. No chest pain. No PND, orthopnea, or pedal edema. No history of seizures. No fever, cold sweats, or palpitations. For this he was seen in the Emergency Room today, started on Protonix drip, and referred to the hospitalist for further evaluation. The patient said that he had an esophagogastroduodenoscopy about fives year ago and a colonoscopy several years ago, which were nondiagnostic or negative during the study. No history of fever or recent change in medication.   REVIEW OF SYSTEMS:  CONSTITUTIONAL: Positive for fatigue but no pain, weight loss, or weight gain. EYES: The patient is legally blind but denies any redness or discharge. ENT: No tinnitus, discharge, redness, or difficulty swallowing. RESPIRATORY: Denies any cough, wheezing, chest pain, or painful respirations. CARDIOVASCULAR: Denies any chest pain. No orthopnea. No palpitations. No syncope. No exertional dyspnea. GI: Positive for nausea, vomiting, diarrhea. Denies any abdominal pain. Denies any hernia.  GU: Denies any dysuria, frequency, or incontinence.  ENDOCRINE: No polyuria, polydipsia, heat or cold intolerance.  HEMATOLOGIC: Admits  to easy bruising, but no swollen glands.  SKIN: No acne. No rashes or change in hair or skin texture. MUSCULOSKELETAL: No joint pain, swelling, redness, or limited activity. NEURO: No numbness, weakness, tremors, or vertigo. Legally blind.  PSYCH: No anxiety, depression, or nervousness.   PAST MEDICAL HISTORY:  1. 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 2. Legally blind. 3. 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  4. Coronary artery disease, status post PCI with stent placement in 2003. 5. Cerebrovascular disease, status post recurrent transient ischemic attacks.  6. Gout.  7. Iron deficiency anemia.  8. Chronic obstructive pulmonary disease.  9. Hyperlipidemia.  10. Depression.  11. Vitamin E deficiency.  12. Glaucoma.  13. Cataracts.  14. History of NASH. 15. Gastroesophageal reflux disease.   16. Hiatal hernia.  SOCIAL HISTORY: Lives at home with his wife. No alcohol, tobacco, or recreational drug use. Has remote history of smoking, quit more than 30 years ago.   FAMILY HISTORY: Reviewed and noncontributory.  ALLERGIES: Amitriptyline, penicillin, naproxen, hydrochlorothiazide, and Actos.   MEDICATIONS:  1. Atenolol 100 mg,  1.5 tablets once daily.  2. Metformin 500 mg 2 tablets twice daily.  3. Enalapril 10 mg once daily.  4. Allopurinol 100 mg twice daily.  5. Ferrous sulfate 325 mg 3 times daily.  6. TriCor 48 mg once daily.  7. Sertraline 50 mg once daily.  8. Amlodipine 10 mg daily.  9. Omeprazole 20 mg daily.  10. Prednisolone eyedrops, one drop both eyes four times daily. 11. Combigan eyedrops?, Timolol 0.2, 0.5% one drop both eyes twice a day. 12. Alphagan 1 drop 3 times daily.  13. Simvastatin 40 mg daily at night.  14.  Lantus 90 units subcutaneous at bedtime.  15. NovoLog sliding scale.  16. Hydrocodone syrup 5 mL by mouth every four hours while awake for cough.  17. Loratadine 10 mg once a day p.r.n.   PHYSICAL EXAMINATION:  VITAL SIGNS:  Temperature 98.1, pulse 79, respiratory rate 18, blood pressure 133/57, sats 97% on room air.   GENERAL: Well-built, well-nourished, elderly Caucasian man lying comfortably on the gurney, awake, alert, oriented to time, place, and person, in no obvious distress.   HEENT: Atraumatic, normocephalic. Pupils equal, reactive to light and accommodation. Extraocular movement intact. Mucous membranes pink, moist.   NECK: Supple. No JV distention.   CHEST: Good air entry. Clear to auscultation.   HEART: Regular rate and rhythm. No murmurs.   ABDOMEN: Distended, moves with respiration, nontender. Bowel sounds normoactive. Has hepatosplenomegaly.   EXTREMITIES: No edema. No clubbing. No deformity.  NEURO:  Cranial nerves II-XII grossly intact but patient is legally blind, able to make out objects.  No focal motor deficit. Has sensory deficit in a stocking pattern in bilateral lower extremities.   PSYCH: Affect appropriate to situation.   LABORATORY, DIAGNOSTIC, AND RADIOLOGICAL DATA: EKG shows left fascicular block. Heart rate of 78. Chest x-ray showed no gross infiltrates.   CBC: White count 5, hemoglobin 6.9 down from 9.4 from nine months ago.  Platelets 76, which seems to be his baseline. MCV 96.   Chemistry shows sodium 141, potassium 4.8, bicarbonate 24, creatinine has moved up to 1.7 from his baseline of 1.1 from nine months ago. BUN is 43, glucose 265, calcium 8.6. Lipase 148, AST 56, down from 79 from nine months ago. Troponin is negative. INR is 1.2 with PT 15. Echo from five months ago showed ejection fraction of more than 55%.  IMPRESSION:  1. Upper GI bleed with hematemesis and acute melanotic stool likely secondary to NASH, rule out varices for recent bleed.  2. Acute kidney injury probably from hypoperfusion and volume depletion.  3. Anemia, acute on chronic.  4. Thrombocytopenia, stable.  5. Type 2 diabetes, stable.  6. History of coronary artery disease, status post PCI, stable.   7. Chronic obstructive pulmonary disease.  8. Hyperlipidemia.  9. Depression, stable.  10. History of NASH with ongoing acute bleed GI-wise.  11. Gastroesophageal reflux disease and hiatal hernia, stable.   PLAN: Admit to general medical floor. N.p.o. for now. The patient appears very  stable.  IV Protonix. Check hemoglobin and hematocrit every 8 hours.  Type and hold 2 units packed red blood cells. IV fluid for rehydration.  GI prophylaxis with Protonix. Deep vein thrombosis prophylaxis with sequential TEDs. Hold the rest of the medications until reviewed this morning. GI evaluation. We will transfer to Dr. Dierdre Highman service in the morning.   CODE STATUS: FULL CODE.  TOTAL PATIENT CARE TIME: 50 minutes.   ____________________________ Jules Husbands Pearletha Furl, MD mia:bjt D: 01/04/2012 02:19:37 ET T: 01/04/2012 07:51:07 ET JOB#: [id number]  cc: Bronx Brogden I. Pearletha Furl, MD, <Dictator> Dianah Field. Mable Fill, MD Carola Frost MD ELECTRONICALLY SIGNED 01/07/2012 16:29

## 2015-04-14 NOTE — Consult Note (Signed)
General Aspect 69 yo male with history of diabetes melitus, hypertension, cad s/p pci hyperlipdemia and copd who was admitted with shortness of breath and hypoxia. He has ruled out for an mi. He remains relatively hypoxic with pleural effusion. He states he is feeling better since he was admitted. Echo done during thi admission reveals preserved lv funciton. Mild right sided pressure elevation.   Physical Exam:   GEN obese    HEENT PERRL    NECK supple    RESP wheezing  rhonchi    CARD Regular rate and rhythm  No murmur    ABD denies tenderness  normal BS    LYMPH negative neck    EXTR negative cyanosis/clubbing    SKIN normal to palpation    NEURO cranial nerves intact    PSYCH A+O to time, place, person   Review of Systems:   Subjective/Chief Complaint shortness of breath    General: No Complaints    Skin: No Complaints    ENT: No Complaints    Eyes: No Complaints    Neck: No Complaints    Respiratory: Short of breath  Wheezing    Cardiovascular: No Complaints    Gastrointestinal: No Complaints    Genitourinary: No Complaints    Vascular: No Complaints    Musculoskeletal: No Complaints    Neurologic: No Complaints    Hematologic: No Complaints    Endocrine: No Complaints    Psychiatric: No Complaints    Review of Systems: All other systems were reviewed and found to be negative    Medications/Allergies Reviewed Medications/Allergies reviewed     "heart problems":    "spine problems":    Back Pain, Chronic:    restless leg syndrome:    Cirrhosis:    Hypercholesterolemia:    sleep apnea:    CVA/Stroke:    Depression:    hiatal hernia:    blind in right eye:    Gout:    Diabetes:    Hypertension:    "leg surgery":    "heart surgery":    Stent - Cardiac:   Home Medications:  omeprazole enteric coated tablet 20 mg: 1 tab(s) orally 2 times a day x 14 days , Active  amlodipine 10 mg oral tablet: 1 tab(s) orally once a  day , Active  allopurinol tablet 100 mg: 1 tab(s) orally 2 times a day , Active  enalapril tablet 10 mg: 1 tab(s) orally once a day , Active  sertraline tablet 50 mg: 1 tab(s) orally once a day , Active  lantus: 80 units  once a day (at bedtime) , Active  vitamin D 1200 units: Active  Sliding Scale Humalog if needed...FSBS 150-200 4 units, 201-250 8 units, >250 12 units.: Active  ferrous sulfate 325 mg tablet: 1 tab(s) orally 3 times a day x 30 days , Active  TriCor 48 mg oral tablet: 1  orally  daily, Active  metformin 500 mg oral tablet: 1  orally 2 times a day...reduced dose from admission, Active  Atenolol 50mg  po lower dose: Active  Humalog Pen 30 ubits tid - Lower Dose: Active  Lasix 40 mg oral tablet: tab(s) orally , Active  loratadine 10 mg oral tablet: 1 tab(s) orally once a day, Active  hydrOXYzine hydrochloride 25 mg oral tablet: 1 tab(s) orally 3 times a day, Active  simvastatin 40 mg oral tablet: 1 tab(s) orally once a day (at bedtime), Active  Combigan 0.2%-0.5% ophthalmic solution: 1 drop(s) to each affected eye every 12  hours Left eye, Active  EKG:   EKG NSR    Actos: Anaphylaxis  Ampicillin: Hives  Naprosyn: GI Distress  Penicillin: Other  HCTZ: Other  Aminophylline: Unknown    Impression 69 yo male with history of copd and cad. Echo reveals preserved lv function. Evidence of effusion and possible volume overload on cxr. etiology of symtpoms is likely secondary to his copd as well as diastolic dysfunciton.    Plan 1. Conintue with current meds 2. Careful diuresis followin renal funciton 3. Consider home oxygen 4. Ambulate in am.   Electronic Signatures: Teodoro Spray (MD)  (Signed 17-Jan-13 19:45)  Authored: General Aspect/Present Illness, History and Physical Exam, Review of System, Past Medical History, Home Medications, EKG , Allergies, Impression/Plan   Last Updated: 17-Jan-13 19:45 by Teodoro Spray (MD)

## 2015-04-14 NOTE — Consult Note (Signed)
Allergies:  Actos: Anaphylaxis  Ampicillin: Hives  Naprosyn: GI Distress  Penicillin: Other  HCTZ: Other  Aminophylline: Unknown  Assessment/Plan:   Assessment/Plan Came to see patient however he remains in the Endoscopy suite. He underwent EGD today.  Pt with type 2 diabetes, well known to me. Has difficult to control diabetes and very insulin resistant. Current diet is clears to be adv, as tol.  A/ Uncontrolled DM2 P/ -Give Lantus 80 units qHS -Give NovoLog 15 units -Give modified NovoLog SSI of 4 units per 50 >150  As pt was not seen today, I will see him tomorrow morning.   Electronic Signatures: Judi Cong (MD)  (Signed 14-Jan-13 17:40)  AuthoredFabienne Bruns, Assessment/Plan   Last Updated: 14-Jan-13 17:40 by Judi Cong (MD)

## 2015-04-14 NOTE — Consult Note (Signed)
Pt very lethargic.  Grip seems weak on left hand.  Suggest CT for possible stroke.  Abd distended but soft, bowel sounds present, liver dullness preserved.  Palpable liver in RUQ.  Check NH3 level in morning.  With hx of cirrhosis and bleeding it could be up and cause confusion.  Electronic Signatures: Manya Silvas (MD)  (Signed on 15-Jan-13 19:41)  Authored  Last Updated: 15-Jan-13 19:41 by Manya Silvas (MD)

## 2015-04-14 NOTE — Consult Note (Signed)
Pt EGD showed AVM of stomach, some oozing blood slowly causing fresh clots to be present.  These were cauterized with argon at setting of 40 and 2 with destruction of the oozing AVM's and I buzzed several other small ones in the antrum.  Would put on clear liquids and BID PPI and advance to full liquid as tolerated.  Discussed with Dr. Mable Fill.  Electronic Signatures: Manya Silvas (MD)  (Signed on 14-Jan-13 17:24)  Authored  Last Updated: 14-Jan-13 17:24 by Manya Silvas (MD)

## 2015-04-14 NOTE — Consult Note (Signed)
PATIENT NAME:  [name], [name] MR#:  [id number] DATE OF BIRTH:  08/19/1946  DATE OF CONSULTATION:  01/04/2012  REFERRING PHYSICIAN:  Not dictated CONSULTING PHYSICIAN:  Manya Silvas, MD  HISTORY OF PRESENT ILLNESS: The patient is a 69 year old male who had onset of hematemesis and epigastric abdominal pain He vomited blood several times before coming to the ER.  He also had several bowel movements that were dark and tarry. He was seen in the ER and admitted to the hospital. I was asked to see him in consultation.   PAST MEDICAL HISTORY:  1. 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  2. The patient is legally blind. 3. Coronary artery disease with stent placement 2003.  4. Transient ischemic attacks. 5. Gout. 6. Iron deficiency anemia.  7. Chronic obstructive pulmonary disease.  8. Hyperlipidemia. 9. 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  10. Hiatal hernia.  11. Glaucoma.  12. Cataracts.   HABITS: Does not drink, does not smoke. He smoked and quit many years ago.   ALLERGIES: Amitriptyline, penicillin, naproxen, hydrochlorothiazide, and Actos.   MEDICATIONS ON ADMISSION:  1. Atenolol 100 mg 1.5 tablets a day.  2. Metformin 500 mg 2 tablets twice a day.  3. Enalapril 10 mg a day.  4. Allopurinol 100 mg twice a day.  5. Ferrous sulfate 325 mg three times a day. 6. TriCor 1 daily. 7. Sertraline 50 mg daily.  8. Amlodipine 10 mg a day.  9. Omeprazole 20 mg a day.  10. Prednisolone eyedrops one drop both eyes 4 times a day. 11. Combigan eyedrops, one drop to both eyes twice a day.  12. Alphagan one drop 3 times a day.  13. Simvastatin 40 mg a day.  14. Lantus insulin 90 units subcutaneous at bedtime. 15. NovoLog sliding scale. 16. Hydrocodone syrup q. 4 hours while awake for cough.  17. Loratadine 10 mg a day.   REVIEW OF SYSTEMS: He denies any shortness of breath. Denies any pressure-like sensation in his chest. He does have some epigastric discomfort.   PHYSICAL  EXAMINATION:  GENERAL:  Pleasant male in no acute distress.   HEENT: Sclerae anicteric. Tongue slightly pale.  Head atraumatic.   CHEST: Clear in anterior fields.   HEART: No murmurs or gallops I can hear.   ABDOMEN: Slight epigastric tenderness to palpation. There is vague fullness in the left upper quadrant, which is about three fingerbreadths inferior to the left ribs.   VITAL SIGNS: Temperature 99, blood pressure 124/68, pulse 80, and pulse oximetry 93% on room air.   LABORATORY DATA: Glucose 265, BUN 43, creatinine 1.77, sodium 141, potassium 4.8, chloride 107, CO2 24, uric acid 6.9, folic acid 26.3. Total protein 7.7, albumin 3.3, total bilirubin 0.5, alkaline phosphatase 77, SGOT 56, SGPT 33. Troponin times two negative. TSH 2.75. Hemoglobin 01/13 at 11:48 p.m. was 6.9. Hemoglobin this morning at 7:30 was 6.3, platelet count 76, proTime 15.3, INR 1.2. Urinalysis shows significant glucose.  Three-way abdomen and chest shows no significant abnormalities.   ASSESSMENT: Upper GI bleed. Rule out portal hypertensive gastropathy, rule out duodenal ulcer, rule out gastric ulcer, rule out Dieulafoy's, rule out AVMs of the stomach. I doubt this is a variceal bleed since his vomitus was dark, although that does not completely rule it out. Plan to keep him n.p.o. and do an upper endoscopy this afternoon. I recommended that he be transfused 2 units of blood. This was discussed with Dr. Mable Fill and the patient has already begun transfusion.  ____________________________ Manya Silvas, MD rte:bjt D: 01/04/2012 13:53:43 ET T: 01/04/2012 14:22:08 ET JOB#: [id number]  cc: Manya Silvas, MD, <Dictator> Dianah Field. Mable Fill, MD Manya Silvas MD ELECTRONICALLY SIGNED 01/19/2012 10:54

## 2015-04-14 NOTE — Consult Note (Signed)
Pt denies confusion, abd distended but soft, hgb 8.4, NH3 56, echo with good LV function.  No new GI suggestions.  Electronic Signatures: Manya Silvas (MD)  (Signed on 18-Jan-13 15:56)  Authored  Last Updated: 18-Jan-13 15:56 by Manya Silvas (MD)

## 2015-04-14 NOTE — Consult Note (Signed)
PATIENT NAME:  [name], [name] MR#:  [id number] DATE OF BIRTH:  04/30/1946  DATE OF CONSULTATION:  01/05/2012  REFERRING PHYSICIAN:  Andrey Farmer, M.D.  CONSULTING PHYSICIAN:  A. Lavone Orn, MD  CHIEF COMPLAINT: Uncontrolled diabetes.   HISTORY OF PRESENT ILLNESS: This is a 69 year old male seen in consultation for uncontrolled diabetes. Mr. Longley is well known to me from clinic. He was admitted yesterday with hemoptysis and he underwent upper endoscopy by Dr. Vira Agar. 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 He has been placed on a clear liquid diet.   He has a long history of type 2 diabetes which is complicated by peripheral neuropathy and retinopathy and microalbuminuria. His most recent hemoglobin A1c in June was 7.5%. Blood sugars are typically high and in the range of 150 - 300 as an outpatient. His regimen includes Lantus 80 units at bedtime and NovoLog 40 to 48 units at breakfast and lunch and 60 to 64 units at supper. He has been compliant with his insulin and compliant with regular clinic follow up. His wife assists him with his diabetes management. He does not follow a low carbohydrate diet. He is very sedentary. Last night he was given Lantus 80 units and a fasting sugar this morning was 163. With breakfast he was given 19 units of subcutaneous NovoLog and before lunch a blood sugar was 161. At this time he has no complaints. He reports good appetite. He denies pain or nausea.   PAST MEDICAL HISTORY:  1. Type 2 diabetes.  2. Diabetic peripheral neuropathy.  3. Proliferative diabetic retinopathy.  4. Microalbuminuria.  5. Coronary artery disease status post PCI with stent placement, 2003.  6. Cerebrovascular disease, status post recurrent transient ischemic attacks.  7. History of gout.  8. History of iron deficiency anemia.  9. Chronic obstructive pulmonary disease.  10. Hyperlipidemia.  11. Depressive disorder.   12. History of vitamin D deficiency.  13. Glaucoma.  14. Cataracts.  15. Nonalcoholic steatohepatitis.  16. History of gastroesophageal reflux disease.  17. Hiatal hernia.  18. Hypertension.  19. Legally blind.   SOCIAL HISTORY: He is married. He has two children. He does not smoke cigarettes or drink alcohol.   FAMILY HISTORY: Positive for diabetes, heart disease, hypertension, and cancer.   ALLERGIES: Include Actos, amitriptyline, amoxicillin, hydrochlorothiazide, naproxen and penicillin.   INPATIENT MEDICATIONS:  1. Allopurinol 100 mg daily.  2. Norvasc 10 mg daily.  3. Tenormin 150 mg daily.  4. Brimonidine 0.15% eyedrops both eyes b.i.d.  5. Enalapril 10 mg daily.  6. Fenofibrate 48 mg daily.  7. Prilosec 20 mg b.i.d.  8. Zoloft 50 mg daily.  9. Zocor 40 mg at bedtime.  10. Lantus 80 units at bedtime.  11. NovoLog 15 units t.i.d. a.c.  12. NovoLog sliding scale.  13. Lasix 40 mg b.i.d.   REVIEW OF SYSTEMS: GENERAL: No weight loss. No fever. HEENT: Legally blind. No recent change in vision. No sore throat. NECK: No neck pain or dysphagia. CARDIAC: No chest pain or palpitation. PULMONARY: No cough or shortness of breath. ABDOMEN: No further nausea. No abdominal pain. Good appetite. EXTREMITIES: Denies leg swelling. SKIN: Denies recent bleeding. He does endorse easy bruisability. No rash. NEUROLOGICAL: No recent falls or weakness.   PHYSICAL EXAMINATION:  VITAL SIGNS: Temperature 98.5, pulse 73, respiratory rate 20, blood pressure 130/73, pulse oximetry 93% on 4 liters O2.   GENERAL: This is a well-developed, obese white male in  no distress.   HEENT: Extraocular movements are intact. Oropharynx is clear.   NECK: Supple. No thyromegaly.   CARDIAC: Regular rate and rhythm. No carotid bruit.   PULMONARY: Clear to auscultation bilaterally. No wheeze. Good inspiratory effort.  ABDOMEN: Diffusely soft, nontender. Positive bowel sounds.   EXTREMITIES: Trace edema is  present.   SKIN: No rash.   NEUROLOGIC: No tremor is present.   PSYCHIATRIC: Alert and oriented x3.  LABORATORY, DIAGNOSTIC AND RADIOLOGICAL DATA: Glucose 117, BUN 35, creatinine 1.5, sodium 148, potassium 4.3, chloride 111, calcium 8.7, magnesium 1.8, albumin 3.4, AST 63, ALT 30, hematocrit 24.2%, WBC 6.5, platelets 71.   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   RECOMMENDATIONS:  1. Continue Lantus 80 units at night.  2. Will advance dose of NovoLog as diet is advanced.  3. Continue current modified sliding scale.     Thank you for the kind request for consultation. I will follow along with you.   ____________________________ A. Lavone Orn, MD ams:cms D: 01/05/2012 12:24:56 ET T: 01/05/2012 13:13:55 ET JOB#: [id number]  cc: A. Lavone Orn, MD, <Dictator> Sherlon Handing MD ELECTRONICALLY SIGNED 01/07/2012 13:42

## 2015-04-14 NOTE — Consult Note (Signed)
I will sign off, reconsult if needed.  Electronic Signatures: Manya Silvas (MD)  (Signed on 19-Jan-13 11:35)  Authored  Last Updated: 19-Jan-13 11:35 by Manya Silvas (MD)

## 2015-04-14 NOTE — Consult Note (Signed)
Due to cirrrhosis and elevated NH3 and possiblity of mental status changes due to this will start him on Xifaxan.  Electronic Signatures: Manya Silvas (MD)  (Signed on 17-Jan-13 15:00)  Authored  Last Updated: 17-Jan-13 15:00 by Manya Silvas (MD)

## 2015-04-14 NOTE — Discharge Summary (Signed)
PATIENT NAME:  [name], GALLICCHIO MR#:  [id number] DATE OF BIRTH:  [birth year]  DATE OF ADMISSION:  01/04/2012 DATE OF DISCHARGE:  01/11/2012  HOSPITAL COURSE: Mr. 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 Gaylyn Cheers. Hemoglobin is stable but was quite low, had underlying chronic anemia. He got into severe respiratory distress and showed evidence of aspiration, probable pneumonia. He was treated with SVN and antibiotics. He was seen by the pulmonologist, Dr. Raul Del. He has underlying known ischemic cardiomyopathy which had been stable. A chest x-ray was somewhat confusing on whether he had underlying congestive failure and/or pneumonia or a combination.   During the hospitalization he was seen by Dr. Ubaldo Glassing who felt that he may have some pulmonary hypertension but did not think he was in significant heart failure though he did recommend continued diuretics along with the antibiotics. The patient slowly improved. It was depicted out that he had abnormal serum ammonia level which was compatible with hepatic encephalopathy and was seen by the GI specialist and attempt to lower this with medication was only somewhat limited in its success.   The patient's hypoxia improved. He ambulated with some assistance and was back to baseline. He had some evidence of mental status decline. This also returned back towards baseline.   He had underlying longstanding insulin dependent diabetic complicated with blindness and nephropathy. He was seen by his endocrinologist, Dr. Gabriel Carina, who helped adjust his medications. He did require less insulin in the hospital than at home and actually got in some low blood sugars for a couple of episodes. This was reflective of his difficulty with dietary compliance at home.   During this hospitalization he had an echocardiogram that showed ejection fraction from 50 to  55% with mild to moderate mitral regurgitation, mild tricuspid regurgitation, right systolic pressure elevation from 30 to 40 compatible with pulmonary hypertension. CAT scan of the chest was most compatible with pneumonia with multiple lobar areas involved in both left and the right. Chest x-ray at the time of discharge showed no acute abnormality.   LABORATORIES AT DISCHARGE: Sugar 148, BUN 33, creatinine 1.38, sodium 134, potassium 4.5, eGFR greater than 60. Ammonia level 40, which was slightly improved from the original one at 63. Hemoglobin was 8.6. Platelets were normal. Differential was normal. Both urine cultures were negative. Blood cultures were negative. Influenza for A and B was negative during his hospital stay. INR was at 1.2.   DISCHARGE DIAGNOSES:  1. 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 2. Aspiration pneumonia.  3. Underlying chronic obstructive pulmonary disease.  4. Ischemic cardiomyopathy.  5. Hypertensive cardiovascular disease.  6. 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   MEDICATIONS AT DISCHARGE:  1. Prilosec 20 mg twice a day. 2. Amlodipine 10 mg a day.  3. Allopurinol 100 mg daily. 4. Enalapril 10 mg daily.  5. Zoloft 50 mg daily. 6. Lantus 80 units once a day at bedtime as changed by Dr. Gabriel Carina. 7. Vitamin D 1200 units daily.  8. Sliding scale insulin 150 to 200 4 units; 201 to 250 8 units; 250 or greater 12 units.  9. Ferrous sulfate 325 t.i.d.   10. TriCor 40 mg daily.  11. Humalog pen. 12. Lasix 40 mg daily.  13. Simvastatin 40 mg daily.  14. Ophthalmic drops were to be continued for his glaucoma. The only addition is that he  was put on atenolol 150 mg daily and Chronulac 15 mL b.i.d. for his encephalopathy and he will continue on Levaquin 500 mg for an additional 10 days.   DO NOT TAKE: We will hold his metformin, hydroxyzine, and loratadine at this time.   REFERRALS: Home health to help with his  weakness with physical therapy, medication adjustments, and monitoring.   OVERALL PROGNOSIS: Remains guarded.   ____________________________ Dianah Field Mable Fill, MD dcc:drc D: 01/11/2012 08:42:00 ET T: 01/11/2012 09:13:27 ET JOB#: [id number]  cc: Dianah Field. Mable Fill, MD, <Dictator> DON Sadie Haber MD ELECTRONICALLY SIGNED 01/27/2012 7:26

## 2015-07-19 ENCOUNTER — Other Ambulatory Visit: Payer: Self-pay | Admitting: Emergency Medicine

## 2015-07-19 ENCOUNTER — Emergency Department: Payer: Medicare Other

## 2015-07-19 ENCOUNTER — Encounter: Payer: Self-pay | Admitting: Occupational Medicine

## 2015-07-19 ENCOUNTER — Inpatient Hospital Stay
Admission: EM | Admit: 2015-07-19 | Discharge: 2015-07-21 | DRG: 812 | Disposition: A | Discharge status: Home / Self Care | Payer: Medicare Other | Attending: Specialist | Admitting: Specialist

## 2015-07-19 DIAGNOSIS — Z79899 Other long term (current) drug therapy: Secondary | ICD-10-CM

## 2015-07-19 DIAGNOSIS — H548 Legal blindness, as defined in USA: Secondary | ICD-10-CM | POA: Diagnosis present

## 2015-07-19 DIAGNOSIS — Z880 Allergy status to penicillin: Secondary | ICD-10-CM

## 2015-07-19 DIAGNOSIS — R1110 Vomiting, unspecified: Secondary | ICD-10-CM

## 2015-07-19 DIAGNOSIS — M109 Gout, unspecified: Secondary | ICD-10-CM | POA: Diagnosis present

## 2015-07-19 DIAGNOSIS — I10 Essential (primary) hypertension: Secondary | ICD-10-CM | POA: Diagnosis present

## 2015-07-19 DIAGNOSIS — F329 Major depressive disorder, single episode, unspecified: Secondary | ICD-10-CM | POA: Diagnosis present

## 2015-07-19 DIAGNOSIS — Z833 Family history of diabetes mellitus: Secondary | ICD-10-CM | POA: Diagnosis not present

## 2015-07-19 DIAGNOSIS — K317 Polyp of stomach and duodenum: Secondary | ICD-10-CM | POA: Diagnosis present

## 2015-07-19 DIAGNOSIS — E1165 Type 2 diabetes mellitus with hyperglycemia: Secondary | ICD-10-CM | POA: Diagnosis present

## 2015-07-19 DIAGNOSIS — R112 Nausea with vomiting, unspecified: Secondary | ICD-10-CM | POA: Diagnosis present

## 2015-07-19 DIAGNOSIS — Z886 Allergy status to analgesic agent status: Secondary | ICD-10-CM | POA: Diagnosis not present

## 2015-07-19 DIAGNOSIS — J449 Chronic obstructive pulmonary disease, unspecified: Secondary | ICD-10-CM | POA: Diagnosis present

## 2015-07-19 DIAGNOSIS — K7581 Nonalcoholic steatohepatitis (NASH): Secondary | ICD-10-CM | POA: Diagnosis present

## 2015-07-19 DIAGNOSIS — Z888 Allergy status to other drugs, medicaments and biological substances status: Secondary | ICD-10-CM | POA: Diagnosis not present

## 2015-07-19 DIAGNOSIS — D638 Anemia in other chronic diseases classified elsewhere: Secondary | ICD-10-CM | POA: Diagnosis present

## 2015-07-19 DIAGNOSIS — K7460 Unspecified cirrhosis of liver: Secondary | ICD-10-CM | POA: Diagnosis present

## 2015-07-19 DIAGNOSIS — H409 Unspecified glaucoma: Secondary | ICD-10-CM | POA: Diagnosis present

## 2015-07-19 DIAGNOSIS — M1990 Unspecified osteoarthritis, unspecified site: Secondary | ICD-10-CM | POA: Diagnosis present

## 2015-07-19 DIAGNOSIS — D509 Iron deficiency anemia, unspecified: Principal | ICD-10-CM | POA: Insufficient documentation

## 2015-07-19 DIAGNOSIS — E11319 Type 2 diabetes mellitus with unspecified diabetic retinopathy without macular edema: Secondary | ICD-10-CM | POA: Diagnosis present

## 2015-07-19 DIAGNOSIS — K922 Gastrointestinal hemorrhage, unspecified: Secondary | ICD-10-CM | POA: Diagnosis present

## 2015-07-19 DIAGNOSIS — G4733 Obstructive sleep apnea (adult) (pediatric): Secondary | ICD-10-CM | POA: Diagnosis present

## 2015-07-19 DIAGNOSIS — K2970 Gastritis, unspecified, without bleeding: Secondary | ICD-10-CM

## 2015-07-19 DIAGNOSIS — K2900 Acute gastritis without bleeding: Secondary | ICD-10-CM | POA: Diagnosis present

## 2015-07-19 DIAGNOSIS — Z794 Long term (current) use of insulin: Secondary | ICD-10-CM | POA: Diagnosis not present

## 2015-07-19 DIAGNOSIS — Z8673 Personal history of transient ischemic attack (TIA), and cerebral infarction without residual deficits: Secondary | ICD-10-CM | POA: Diagnosis not present

## 2015-07-19 DIAGNOSIS — R638 Other symptoms and signs concerning food and fluid intake: Secondary | ICD-10-CM

## 2015-07-19 LAB — GLUCOSE, CAPILLARY
Glucose-Capillary: 108 mg/dL — ABNORMAL HIGH (ref 65–99)
Glucose-Capillary: 52 mg/dL — ABNORMAL LOW (ref 65–99)
Glucose-Capillary: 45 mg/dL — ABNORMAL LOW (ref 65–99)
Glucose-Capillary: 94 mg/dL (ref 65–99)
Glucose-Capillary: 82 mg/dL (ref 65–99)
Glucose-Capillary: 127 mg/dL — ABNORMAL HIGH (ref 65–99)
Glucose-Capillary: 64 mg/dL — ABNORMAL LOW (ref 65–99)
Glucose-Capillary: 75 mg/dL (ref 65–99)

## 2015-07-19 LAB — CBC WITH DIFFERENTIAL/PLATELET
BASOS ABS: 0 10*3/uL (ref 0–0.1)
Basophils Relative: 1 %
Eosinophils Absolute: 0.2 10*3/uL (ref 0–0.7)
Eosinophils Relative: 5 %
HCT: 20.6 % — ABNORMAL LOW (ref 40.0–52.0)
HEMOGLOBIN: 6.7 g/dL — AB (ref 13.0–18.0)
Lymphocytes Relative: 11 %
Lymphs Abs: 0.4 10*3/uL — ABNORMAL LOW (ref 1.0–3.6)
MCH: 30.9 pg (ref 26.0–34.0)
MCHC: 32.4 g/dL (ref 32.0–36.0)
MCV: 95.4 fL (ref 80.0–100.0)
Monocytes Absolute: 0.2 10*3/uL (ref 0.2–1.0)
Monocytes Relative: 7 %
NEUTROS ABS: 2.7 10*3/uL (ref 1.4–6.5)
Neutrophils Relative %: 76 %
PLATELETS: 53 10*3/uL — AB (ref 150–440)
RBC: 2.16 MIL/uL — ABNORMAL LOW (ref 4.40–5.90)
RDW: 18.1 % — AB (ref 11.5–14.5)
WBC: 3.5 10*3/uL — AB (ref 3.8–10.6)

## 2015-07-19 LAB — PROTIME-INR
INR: 2.7
INR: 1.3
INR: 1.2
INR: 1.16
PROTHROMBIN TIME: 28.8 s — AB (ref 11.4–15.0)
Prothrombin Time: 16.4 seconds — ABNORMAL HIGH (ref 11.4–15.0)
Prothrombin Time: 15.4 seconds — ABNORMAL HIGH (ref 11.4–15.0)
Prothrombin Time: 15 seconds (ref 11.4–15.0)

## 2015-07-19 LAB — URINALYSIS COMPLETE WITH MICROSCOPIC (ARMC ONLY)
BACTERIA UA: NONE SEEN
Bilirubin Urine: NEGATIVE
Glucose, UA: NEGATIVE mg/dL
HGB URINE DIPSTICK: NEGATIVE
Ketones, ur: NEGATIVE mg/dL
LEUKOCYTES UA: NEGATIVE
NITRITE: NEGATIVE
PROTEIN: NEGATIVE mg/dL
RBC / HPF: NONE SEEN RBC/hpf (ref 0–5)
SPECIFIC GRAVITY, URINE: 1.014 (ref 1.005–1.030)
pH: 5 (ref 5.0–8.0)

## 2015-07-19 LAB — COMPREHENSIVE METABOLIC PANEL
ALT: 25 U/L (ref 17–63)
ANION GAP: 6 (ref 5–15)
AST: 60 U/L — ABNORMAL HIGH (ref 15–41)
Albumin: 3.3 g/dL — ABNORMAL LOW (ref 3.5–5.0)
Alkaline Phosphatase: 121 U/L (ref 38–126)
BILIRUBIN TOTAL: 1.6 mg/dL — AB (ref 0.3–1.2)
BUN: 17 mg/dL (ref 6–20)
CHLORIDE: 111 mmol/L (ref 101–111)
CO2: 22 mmol/L (ref 22–32)
Calcium: 8.7 mg/dL — ABNORMAL LOW (ref 8.9–10.3)
Creatinine, Ser: 0.63 mg/dL (ref 0.61–1.24)
Glucose, Bld: 113 mg/dL — ABNORMAL HIGH (ref 65–99)
Potassium: 3.5 mmol/L (ref 3.5–5.1)
SODIUM: 139 mmol/L (ref 135–145)
TOTAL PROTEIN: 7.6 g/dL (ref 6.5–8.1)

## 2015-07-19 LAB — APTT: aPTT: 44 seconds — ABNORMAL HIGH (ref 24–36)

## 2015-07-19 LAB — TSH: TSH: 3.736 u[IU]/mL (ref 0.350–4.500)

## 2015-07-19 LAB — PREPARE RBC (CROSSMATCH)

## 2015-07-19 LAB — MAGNESIUM: Magnesium: 1.8 mg/dL (ref 1.7–2.4)

## 2015-07-19 LAB — LIPASE, BLOOD: Lipase: 46 U/L (ref 22–51)

## 2015-07-19 LAB — HEMOGLOBIN A1C: Hgb A1c MFr Bld: 5.4 % (ref 4.0–6.0)

## 2015-07-19 LAB — TROPONIN I: Troponin I: <0.03 ng/mL (ref <0.031)

## 2015-07-19 LAB — ABO/RH: ABO/RH(D): AB POS

## 2015-07-19 MED ORDER — SODIUM CHLORIDE 0.9 % IV SOLN: INTRAVENOUS | Status: DC

## 2015-07-19 MED ORDER — FERROUS SULFATE 325 (65 FE) MG PO TABS
325.0000 mg | ORAL_TABLET | Freq: Three times a day (TID) | ORAL | Status: DC
Start: 2015-07-19 — End: 2015-07-21
  Administered 2015-07-19 – 2015-07-21 (×4): 325 mg via ORAL
  Filled 2015-07-19 (×5): qty 1

## 2015-07-19 MED ORDER — EMPTY CONTAINERS FLEXIBLE MISC
25.0000 [IU]/kg | Status: DC
  Filled 2015-07-19: qty 81

## 2015-07-19 MED ORDER — TRAMADOL HCL 50 MG PO TABS: 50.0000 mg | ORAL_TABLET | Freq: Three times a day (TID) | ORAL | Status: DC | PRN

## 2015-07-19 MED ORDER — PROMETHAZINE HCL 25 MG/ML IJ SOLN
12.5000 mg | Freq: Once | INTRAMUSCULAR | Status: AC
  Administered 2015-07-19: 12.5 mg via INTRAVENOUS
  Filled 2015-07-19: qty 1

## 2015-07-19 MED ORDER — INSULIN ASPART 100 UNIT/ML ~~LOC~~ SOLN: 0.0000 [IU] | Freq: Three times a day (TID) | SUBCUTANEOUS | Status: DC

## 2015-07-19 MED ORDER — ACETAMINOPHEN 325 MG PO TABS: 650.0000 mg | ORAL_TABLET | ORAL | Status: DC | PRN

## 2015-07-19 MED ORDER — INSULIN GLARGINE 100 UNIT/ML ~~LOC~~ SOLN
30.0000 [IU] | Freq: Every day | SUBCUTANEOUS | Status: DC
Start: 2015-07-19 — End: 2015-07-21
  Administered 2015-07-20: 30 [IU] via SUBCUTANEOUS
  Filled 2015-07-19 (×3): qty 0.3

## 2015-07-19 MED ORDER — VITAMIN K1 10 MG/ML IJ SOLN
10.0000 mg | INTRAVENOUS | Status: AC
  Administered 2015-07-19: 10 mg via INTRAVENOUS
  Filled 2015-07-19: qty 1

## 2015-07-19 MED ORDER — RIFAXIMIN 550 MG PO TABS
550.0000 mg | ORAL_TABLET | Freq: Two times a day (BID) | ORAL | Status: DC
  Administered 2015-07-19 – 2015-07-21 (×5): 550 mg via ORAL
  Filled 2015-07-19 (×5): qty 1

## 2015-07-19 MED ORDER — DEXTROSE 50 % IV SOLN
25.0000 mL | Freq: Once | INTRAVENOUS | Status: AC
  Administered 2015-07-19: 23:00:00 25 mL via INTRAVENOUS

## 2015-07-19 MED ORDER — PROTHROMBIN COMPLEX CONC HUMAN 500 UNITS IV KIT
2000.0000 [IU] | PACK | Freq: Once | INTRAVENOUS | Status: AC
  Administered 2015-07-19: 2000 [IU] via INTRAVENOUS
  Filled 2015-07-19: qty 80

## 2015-07-19 MED ORDER — ONDANSETRON HCL 4 MG/2ML IJ SOLN
4.0000 mg | INTRAMUSCULAR | Status: DC
  Filled 2015-07-19: qty 2

## 2015-07-19 MED ORDER — DEXTROSE 50 % IV SOLN
25.0000 mL | Freq: Once | INTRAVENOUS | Status: AC
  Administered 2015-07-19: 25 mL via INTRAVENOUS

## 2015-07-19 MED ORDER — METHOCARBAMOL 500 MG PO TABS: 500.0000 mg | ORAL_TABLET | Freq: Three times a day (TID) | ORAL | Status: DC | PRN

## 2015-07-19 MED ORDER — PANTOPRAZOLE SODIUM 40 MG IV SOLR: 40.0000 mg | Freq: Once | INTRAVENOUS | Status: DC

## 2015-07-19 MED ORDER — SODIUM CHLORIDE 0.9 % IJ SOLN
3.0000 mL | Freq: Two times a day (BID) | INTRAMUSCULAR | Status: DC
  Administered 2015-07-19 – 2015-07-21 (×3): 3 mL via INTRAVENOUS

## 2015-07-19 MED ORDER — SERTRALINE HCL 50 MG PO TABS
25.0000 mg | ORAL_TABLET | Freq: Every day | ORAL | Status: DC
  Administered 2015-07-19 – 2015-07-21 (×3): 25 mg via ORAL
  Filled 2015-07-19 (×2): qty 1
  Filled 2015-07-19: qty 2

## 2015-07-19 MED ORDER — SODIUM CHLORIDE 0.9 % IV SOLN
10.0000 mL/h | Freq: Once | INTRAVENOUS | Status: AC
  Administered 2015-07-19: 10 mL/h via INTRAVENOUS

## 2015-07-19 MED ORDER — SODIUM CHLORIDE 0.9 % IV SOLN
Freq: Once | INTRAVENOUS | Status: AC
  Administered 2015-07-19: 13:00:00 via INTRAVENOUS

## 2015-07-19 MED ORDER — DEXTROSE 50 % IV SOLN
INTRAVENOUS | Status: AC
  Filled 2015-07-19: qty 50

## 2015-07-19 MED ORDER — CHOLESTYRAMINE 4 G PO PACK
4.0000 g | PACK | Freq: Three times a day (TID) | ORAL | Status: DC
  Administered 2015-07-19 – 2015-07-20 (×3): 4 g via ORAL
  Filled 2015-07-19 (×9): qty 1

## 2015-07-19 MED ORDER — ONDANSETRON HCL 4 MG PO TABS
4.0000 mg | ORAL_TABLET | Freq: Four times a day (QID) | ORAL | Status: DC | PRN
Start: 2015-07-19 — End: 2015-07-21

## 2015-07-19 MED ORDER — BRIMONIDINE TARTRATE 0.2 % OP SOLN
1.0000 [drp] | Freq: Two times a day (BID) | OPHTHALMIC | Status: DC
  Administered 2015-07-19 – 2015-07-21 (×5): 1 [drp] via OPHTHALMIC
  Filled 2015-07-19: qty 5

## 2015-07-19 MED ORDER — ONDANSETRON HCL 4 MG/2ML IJ SOLN: 4.0000 mg | Freq: Four times a day (QID) | INTRAMUSCULAR | Status: DC | PRN

## 2015-07-19 MED ORDER — VITAMIN D 1000 UNITS PO TABS
1000.0000 [IU] | ORAL_TABLET | Freq: Every day | ORAL | Status: DC
  Administered 2015-07-19 – 2015-07-21 (×3): 1000 [IU] via ORAL
  Filled 2015-07-19 (×4): qty 1

## 2015-07-19 MED ORDER — OCTREOTIDE LOAD VIA INFUSION
50.0000 ug | Freq: Once | INTRAVENOUS | Status: AC
  Administered 2015-07-19: 50 ug via INTRAVENOUS
  Filled 2015-07-19 (×3): qty 25

## 2015-07-19 MED ORDER — SODIUM CHLORIDE 0.9 % IV SOLN
80.0000 mg | Freq: Once | INTRAVENOUS | Status: AC
  Administered 2015-07-19: 80 mg via INTRAVENOUS
  Filled 2015-07-19: qty 80

## 2015-07-19 MED ORDER — SODIUM CHLORIDE 0.9 % IV SOLN
8.0000 mg/h | INTRAVENOUS | Status: DC
  Administered 2015-07-19 – 2015-07-20 (×3): 8 mg/h via INTRAVENOUS
  Filled 2015-07-19 (×3): qty 80

## 2015-07-19 MED ORDER — TIMOLOL MALEATE 0.5 % OP SOLN
1.0000 [drp] | Freq: Two times a day (BID) | OPHTHALMIC | Status: DC
  Administered 2015-07-19 – 2015-07-21 (×5): 1 [drp] via OPHTHALMIC
  Filled 2015-07-19: qty 5

## 2015-07-19 MED ORDER — ALLOPURINOL 100 MG PO TABS
100.0000 mg | ORAL_TABLET | Freq: Two times a day (BID) | ORAL | Status: DC
  Administered 2015-07-19 – 2015-07-21 (×5): 100 mg via ORAL
  Filled 2015-07-19 (×5): qty 1

## 2015-07-19 MED ORDER — SODIUM CHLORIDE 0.9 % IV SOLN
50.0000 ug/h | INTRAVENOUS | Status: DC
  Administered 2015-07-19 – 2015-07-20 (×3): 50 ug/h via INTRAVENOUS
  Filled 2015-07-19 (×9): qty 1

## 2015-07-19 MED ORDER — DEXTROSE-NACL 5-0.9 % IV SOLN
INTRAVENOUS | Status: DC
Start: 2015-07-19 — End: 2015-07-20
  Administered 2015-07-19 – 2015-07-20 (×2): via INTRAVENOUS

## 2015-07-19 MED ORDER — SODIUM CHLORIDE 0.9 % IV BOLUS (SEPSIS)
1000.0000 mL | Freq: Once | INTRAVENOUS | Status: AC
  Administered 2015-07-19: 1000 mL via INTRAVENOUS

## 2015-07-19 MED ORDER — BRIMONIDINE TARTRATE-TIMOLOL 0.2-0.5 % OP SOLN: 1.0000 [drp] | Freq: Two times a day (BID) | OPHTHALMIC | Status: DC

## 2015-07-19 MED ORDER — FUROSEMIDE 20 MG PO TABS
40.0000 mg | ORAL_TABLET | Freq: Every day | ORAL | Status: DC
  Administered 2015-07-19: 40 mg via ORAL
  Filled 2015-07-19: qty 2

## 2015-07-19 NOTE — Plan of Care (Addendum)
Problem: Discharge Progression Outcomes Goal: Barriers To Progression Addressed/Resolved Outcome: Progressing Patient is from home and uses and walker at home expects to be discharged home with wife  No c/o pain this shift Blood sugar was 45 earlier in shift, D50 given in 25mg  and BS came up to 94 Patient was given 1 unit of blood on the floor and 1 unit in the ED Sandostatin and Protonix drips infusing , along with D5 in NS at 52ml/hr VSS

## 2015-07-19 NOTE — H&P (Signed)
Luke MIMBS Sr. is an 69 y.o. male.   Chief Complaint: Nausea and vomiting HPI: Patient presents emergency department complaining of nausea and vomiting for 2 days. The patient is legally blind and could not tell if there is any blood or bile in his emesis. He denies any pain, diarrhea, shortness of breath or lightheadedness. In the emergency department patient was found to be very anemic. Consultation with gastroenterology recommended admission for endoscopy and transfusion of blood which prompted emergency department to call for admission.  Past Medical History  Diagnosis Date  . Stroke   . Hypertension   . COPD (chronic obstructive pulmonary disease)   . Diabetes mellitus without complication   . H/O hiatal hernia   . Depression   . Arthritis   . Hepatitis     NASH  . Cirrhosis of liver not due to alcohol   . Obstructive sleep apnea 11/20/2014    Past Surgical History  Procedure Laterality Date  . Nose surgery    . Appendectomy    . Cardiac catheterization    . Eye surgery      leaglly blind  . Anterior cervical decompression/discectomy fusion 4 levels N/A 11/13/2014    Procedure:  Cervical three-four, Cervical four-five, Cervical five-six, Cervical six- seven anterior cervical decompression with fusion and interbody prosthesis plating and bonegraft;  Surgeon: Consuella Lose, MD;  Location: [facility];  Service: Neurosurgery;  Laterality: N/A;    Family History  Problem Relation Age of Onset  . Kidney failure Sister     CKD  . Diabetes Mellitus II Sister    Social History:  reports that he has never smoked. He does not have any smokeless tobacco history on file. He reports that he does not drink alcohol or use illicit drugs.  Allergies:  Allergies  Allergen Reactions  . Actos [Pioglitazone] Anaphylaxis  . Aminophylline Other (See Comments)    Reaction: Unknown  . Amitriptyline Other (See Comments)    Reaction: Unknown   . Ampicillin Hives  . Hctz  [Hydrochlorothiazide] Other (See Comments)    Reaction: Unknown   . Naproxen Other (See Comments)    Reaction: Unknown   . Penicillins Rash    rash    Prior to Admission medications   Medication Sig Start Date End Date Taking? Authorizing Provider  acetaminophen (TYLENOL) 325 MG tablet Take 650 mg by mouth every 4 (four) hours as needed (Pain). 1-2 tablets every 4-6 hours as needed for pain.   Yes Historical Provider, MD  allopurinol (ZYLOPRIM) 100 MG tablet Take 1 tablet (100 mg total) by mouth 2 (two) times daily. 11/30/14  Yes Daniel J Angiulli, PA-C  brimonidine-timolol (COMBIGAN) 0.2-0.5 % ophthalmic solution Place 1 drop into the left eye every 12 (twelve) hours.   Yes Historical Provider, MD  Cholecalciferol (VITAMIN D-3) 1000 UNITS CAPS Take 1 capsule (1,000 Units total) by mouth daily. 11/30/14  Yes Daniel J Angiulli, PA-C  cholestyramine (QUESTRAN) 4 G packet Take 1 packet (4 g total) by mouth 3 (three) times daily. 11/30/14  Yes Daniel J Angiulli, PA-C  ferrous sulfate 325 (65 FE) MG tablet Take 1 tablet (325 mg total) by mouth 3 (three) times daily with meals. 11/30/14  Yes Daniel J Angiulli, PA-C  furosemide (LASIX) 40 MG tablet Take 1 tablet (40 mg total) by mouth daily. 11/30/14  Yes Daniel J Angiulli, PA-C  insulin aspart (NOVOLOG) 100 UNIT/ML injection Inject 20-25 Units into the skin 3 (three) times daily before meals. 20 units before  breakfast; 25 units before lunch and supper.   Yes Historical Provider, MD  insulin aspart (NOVOLOG) 100 UNIT/ML injection Inject 3-15 Units into the skin 3 (three) times daily before meals. Per sliding scale, before meals; blood sugar 200-250 add 3 units; 251-300 add 6 units; 301-350 add 9 units; 351-400 add 12 units; over 401 add 15 units.   Yes Historical Provider, MD  insulin glargine (LANTUS) 100 UNIT/ML injection Inject 0.7 mLs (70 Units total) into the skin at bedtime. Patient taking differently: Inject 80 Units into the skin at bedtime.   11/30/14  Yes Daniel J Angiulli, PA-C  lactulose (CHRONULAC) 10 GM/15ML solution Take 45 mLs (30 g total) by mouth 2 (two) times daily. Patient taking differently: Take 20 g by mouth 2 (two) times daily.  11/30/14  Yes Daniel J Angiulli, PA-C  omeprazole (PRILOSEC) 20 MG capsule Take 1 capsule (20 mg total) by mouth 2 (two) times daily before a meal. 11/30/14  Yes Daniel J Angiulli, PA-C  rifaximin (XIFAXAN) 550 MG TABS tablet Take 1 tablet (550 mg total) by mouth 2 (two) times daily. 11/30/14  Yes Daniel J Angiulli, PA-C  sertraline (ZOLOFT) 25 MG tablet TAKE ONE TABLET BY MOUTH EVERY DAY 01/22/15  Yes Meredith Staggers, MD  traMADol (ULTRAM) 50 MG tablet Take 1 tablet (50 mg total) by mouth every 8 (eight) hours as needed for moderate pain. 11/30/14  Yes Daniel J Angiulli, PA-C  glimepiride (AMARYL) 1 MG tablet TAKE ONE TABLET BY MOUTH EVERY DAY WITH BREAKFAST 01/22/15   Meredith Staggers, MD  methocarbamol (ROBAXIN) 500 MG tablet Take 1 tablet (500 mg total) by mouth 3 (three) times daily as needed for muscle spasms. 12/10/14   Meredith Staggers, MD     Results for orders placed or performed during the hospital encounter of 07/19/15 (from the past 48 hour(s))  Glucose, capillary     Status: Abnormal   Collection Time: 07/19/15  3:37 AM  Result Value Ref Range   Glucose-Capillary 108 (H) 65 - 99 mg/dL  CBC with Differential     Status: Abnormal   Collection Time: 07/19/15  3:47 AM  Result Value Ref Range   WBC 3.5 (L) 3.8 - 10.6 K/uL   RBC 2.16 (L) 4.40 - 5.90 MIL/uL   Hemoglobin 6.7 (L) 13.0 - 18.0 g/dL   HCT 20.6 (L) 40.0 - 52.0 %   MCV 95.4 80.0 - 100.0 fL   MCH 30.9 26.0 - 34.0 pg   MCHC 32.4 32.0 - 36.0 g/dL   RDW 18.1 (H) 11.5 - 14.5 %   Platelets 53 (L) 150 - 440 K/uL   Neutrophils Relative % 76% %   Neutro Abs 2.7 1.4 - 6.5 K/uL   Lymphocytes Relative 11% %   Lymphs Abs 0.4 (L) 1.0 - 3.6 K/uL   Monocytes Relative 7% %   Monocytes Absolute 0.2 0.2 - 1.0 K/uL   Eosinophils  Relative 5% %   Eosinophils Absolute 0.2 0 - 0.7 K/uL   Basophils Relative 1% %   Basophils Absolute 0.0 0 - 0.1 K/uL  Comprehensive metabolic panel     Status: Abnormal   Collection Time: 07/19/15  3:47 AM  Result Value Ref Range   Sodium 139 135 - 145 mmol/L   Potassium 3.5 3.5 - 5.1 mmol/L   Chloride 111 101 - 111 mmol/L   CO2 22 22 - 32 mmol/L   Glucose, Bld 113 (H) 65 - 99 mg/dL   BUN 17 6 -  20 mg/dL   Creatinine, Ser 0.63 0.61 - 1.24 mg/dL   Calcium 8.7 (L) 8.9 - 10.3 mg/dL   Total Protein 7.6 6.5 - 8.1 g/dL   Albumin 3.3 (L) 3.5 - 5.0 g/dL   AST 60 (H) 15 - 41 U/L   ALT 25 17 - 63 U/L   Alkaline Phosphatase 121 38 - 126 U/L   Total Bilirubin 1.6 (H) 0.3 - 1.2 mg/dL   GFR calc non Af Amer >60 >60 mL/min   GFR calc Af Amer >60 >60 mL/min    Comment: (NOTE) The eGFR has been calculated using the CKD EPI equation. This calculation has not been validated in all clinical situations. eGFR's persistently <60 mL/min signify possible Chronic Kidney Disease.    Anion gap 6 5 - 15  Lipase, blood     Status: None   Collection Time: 07/19/15  3:47 AM  Result Value Ref Range   Lipase 46 22 - 51 U/L  Troponin I     Status: None   Collection Time: 07/19/15  3:47 AM  Result Value Ref Range   Troponin I <0.03 <0.031 ng/mL    Comment:        NO INDICATION OF MYOCARDIAL INJURY.   Magnesium     Status: None   Collection Time: 07/19/15  3:47 AM  Result Value Ref Range   Magnesium 1.8 1.7 - 2.4 mg/dL  Protime-INR     Status: Abnormal   Collection Time: 07/19/15  3:47 AM  Result Value Ref Range   Prothrombin Time 28.8 (H) 11.4 - 15.0 seconds   INR 2.70   APTT     Status: Abnormal   Collection Time: 07/19/15  3:47 AM  Result Value Ref Range   aPTT 44 (H) 24 - 36 seconds    Comment:        IF BASELINE aPTT IS ELEVATED, SUGGEST PATIENT RISK ASSESSMENT BE USED TO DETERMINE APPROPRIATE ANTICOAGULANT THERAPY.   Prepare RBC     Status: None   Collection Time: 07/19/15  4:45 AM   Result Value Ref Range   Order Confirmation ORDER PROCESSED BY BLOOD BANK   Type and screen     Status: None (Preliminary result)   Collection Time: 07/19/15  4:45 AM  Result Value Ref Range   ABO/RH(D) AB POS    Antibody Screen NEG    Sample Expiration 07/22/2015    Unit Number [id number]    Blood Component Type RED CELLS,LR    Unit division 00    Status of Unit ISSUED    Transfusion Status OK TO TRANSFUSE    Crossmatch Result Compatible    Unit Number [id number]    Blood Component Type RED CELLS,LR    Unit division 00    Status of Unit ALLOCATED    Transfusion Status OK TO TRANSFUSE    Crossmatch Result Compatible   Urinalysis complete, with microscopic ([facility] only)     Status: Abnormal   Collection Time: 07/19/15  5:05 AM  Result Value Ref Range   Color, Urine YELLOW (A) YELLOW   APPearance CLEAR (A) CLEAR   Glucose, UA NEGATIVE NEGATIVE mg/dL   Bilirubin Urine NEGATIVE NEGATIVE   Ketones, ur NEGATIVE NEGATIVE mg/dL   Specific Gravity, Urine 1.014 1.005 - 1.030   Hgb urine dipstick NEGATIVE NEGATIVE   pH 5.0 5.0 - 8.0   Protein, ur NEGATIVE NEGATIVE mg/dL   Nitrite NEGATIVE NEGATIVE   Leukocytes, UA NEGATIVE NEGATIVE   RBC / HPF NONE SEEN  0 - 5 RBC/hpf   WBC, UA 0-5 0 - 5 WBC/hpf   Bacteria, UA NONE SEEN NONE SEEN   Squamous Epithelial / LPF 0-5 (A) NONE SEEN   Mucous PRESENT    Hyaline Casts, UA PRESENT    Dg Chest 1 View  07/19/2015   CLINICAL DATA:  Vomiting for 1 week.  EXAM: CHEST  1 VIEW  COMPARISON:  11/13/2014  FINDINGS: The cardiomediastinal contours are normal. Septal defect closure device in place. Pulmonary vasculature is normal. No consolidation, pleural effusion, or pneumothorax. No acute osseous abnormalities are seen.  IMPRESSION: No acute pulmonary process.   Electronically Signed   By: Jeb Levering M.D.   On: 07/19/2015 04:36   Dg Abd 2 Views  07/19/2015   CLINICAL DATA:  Nausea and vomiting since yesterday.  Weakness.  EXAM: ABDOMEN - 2  VIEW  COMPARISON:  CT 12/25/2014  FINDINGS: There is no free intra-abdominal air. No dilated bowel loops to suggest obstruction. Small volume of stool throughout the colon. No radiopaque calculi. No acute osseous abnormalities are seen. There degenerative change of both hips.  IMPRESSION: Normal bowel gas pattern.  No free air.   Electronically Signed   By: Jeb Levering M.D.   On: 07/19/2015 04:34    Review of Systems  Constitutional: Negative for fever and chills.  HENT: Negative for sore throat and tinnitus.   Eyes: Negative for blurred vision and redness.  Respiratory: Negative for cough and shortness of breath.   Cardiovascular: Negative for chest pain, palpitations, orthopnea and PND.  Gastrointestinal: Positive for nausea and vomiting. Negative for abdominal pain and diarrhea.  Genitourinary: Negative for dysuria, urgency and frequency.  Musculoskeletal: Negative for myalgias and joint pain.  Skin: Negative for rash.       No lesions  Neurological: Negative for speech change, focal weakness and weakness.  Endo/Heme/Allergies: Does not bruise/bleed easily.       No temperature intolerance  Psychiatric/Behavioral: Negative for depression and suicidal ideas.    Blood pressure 133/71, pulse 83, temperature 97.9 F (36.6 C), temperature source Oral, resp. rate 14, height 5' 4"  (1.626 m), weight 80.559 kg (177 lb 9.6 oz), SpO2 100 %. Physical Exam  Nursing note and vitals reviewed. Constitutional: He is oriented to person, place, and time. He appears well-developed and well-nourished. No distress.  HENT:  Head: Normocephalic and atraumatic.  Mouth/Throat: Oropharynx is clear and moist.  Eyes: Conjunctivae and EOM are normal. Pupils are equal, round, and reactive to light. No scleral icterus.  Neck: Normal range of motion. Neck supple. No JVD present. No tracheal deviation present. No thyromegaly present.  Cardiovascular: Normal rate, regular rhythm, normal heart sounds and intact  distal pulses.  Exam reveals no gallop and no friction rub.   No murmur heard. Respiratory: Breath sounds normal. No respiratory distress.  GI: Soft. Bowel sounds are normal. He exhibits distension. He exhibits no mass. There is no tenderness.  Musculoskeletal: Normal range of motion. He exhibits no edema.  Lymphadenopathy:    He has no cervical adenopathy.  Neurological: He is alert and oriented to person, place, and time. No cranial nerve deficit.  Skin: Skin is warm and dry. No rash noted. He is not diaphoretic. No erythema. There is pallor.  Psychiatric: He has a normal mood and affect. His behavior is normal. Judgment and thought content normal.     Assessment/Plan This is a 69 year old Caucasian male admitted for intractable nausea and vomiting as well as severe anemia. 1. Nausea and  vomiting: Decrease with the initiation of antiemetics. A lecture lites are within normal limits. Of place the patient on maintenance IV fluid. 2. Anemia: Secondary to chronic disease. The patient will likely be transfusion dependent. 2 units of packed red blood cells of been ordered and are in the process of transfusion. He is hemodynamically stable. 3. 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 Gastroenterology consult placed. The patient is on IV Protonix 4. Diabetes mellitus type 2: I place patient on a reduced dose of basal insulin coverage and will receive sliding scale coverage as well. I have held his oral hypoglycemic medication. 5. Nonalcoholic cirrhosis: Continue rifaximin and lactulose (the latter may continue after endoscopy) 6. Depression: Continue sertraline 7. DVT prophylaxis: SCDs 8. GI prophylaxis: As above The patient is a full code. Time spent on admission orders and patient care approximately 35 minutes  Harrie Foreman 07/19/2015, 7:23 AM

## 2015-07-19 NOTE — Progress Notes (Addendum)
ANTICOAGULATION CONSULT NOTE - Kcentra follow up  Pharmacy Consult for monitoring after receipt of Kcentra   Allergies  Allergen Reactions  . Actos [Pioglitazone] Anaphylaxis  . Aminophylline Other (See Comments)    Reaction: Unknown  . Amitriptyline Other (See Comments)    Reaction: Unknown   . Ampicillin Hives  . Hctz [Hydrochlorothiazide] Other (See Comments)    Reaction: Unknown   . Naproxen Other (See Comments)    Reaction: Unknown   . Penicillins Rash    rash   Patient Measurements: Height: 5\' 4"  (162.6 cm) Weight: 169 lb (76.658 kg) IBW/kg (Calculated) : 59.2   Vital Signs: Temp: 98.1 F (36.7 C) (07/29 0806) Temp Source: Oral (07/29 0806) BP: 144/65 mmHg (07/29 0806) Pulse Rate: 86 (07/29 0806)  Labs:  Recent Labs  07/19/15 0347  HGB 6.7*  HCT 20.6*  PLT 53*  APTT 44*  LABPROT 28.8*  INR 2.70  CREATININE 0.63  TROPONINI <0.03    Estimated Creatinine Clearance: 82.8 mL/min (by C-G formula based on Cr of 0.63).   Medical History: Past Medical History  Diagnosis Date  . Stroke   . Hypertension   . COPD (chronic obstructive pulmonary disease)   . Diabetes mellitus without complication   . H/O hiatal hernia   . Depression   . Arthritis   . Hepatitis     NASH  . Cirrhosis of liver not due to alcohol   . Obstructive sleep apnea 11/20/2014    Medications:  Prescriptions prior to admission  Medication Sig Dispense Refill Last Dose  . acetaminophen (TYLENOL) 325 MG tablet Take 650 mg by mouth every 4 (four) hours as needed (Pain). 1-2 tablets every 4-6 hours as needed for pain.     Marland Kitchen allopurinol (ZYLOPRIM) 100 MG tablet Take 1 tablet (100 mg total) by mouth 2 (two) times daily. 60 tablet 1   . brimonidine-timolol (COMBIGAN) 0.2-0.5 % ophthalmic solution Place 1 drop into the left eye every 12 (twelve) hours.   11/12/2014 at Unknown time  . Cholecalciferol (VITAMIN D-3) 1000 UNITS CAPS Take 1 capsule (1,000 Units total) by mouth daily. 30 capsule 1    . cholestyramine (QUESTRAN) 4 G packet Take 1 packet (4 g total) by mouth 3 (three) times daily. 60 each 12   . ferrous sulfate 325 (65 FE) MG tablet Take 1 tablet (325 mg total) by mouth 3 (three) times daily with meals. 90 tablet 3   . furosemide (LASIX) 40 MG tablet Take 1 tablet (40 mg total) by mouth daily. 30 tablet 1   . insulin aspart (NOVOLOG) 100 UNIT/ML injection Inject 20-25 Units into the skin 3 (three) times daily before meals. 20 units before breakfast; 25 units before lunch and supper.     . insulin aspart (NOVOLOG) 100 UNIT/ML injection Inject 3-15 Units into the skin 3 (three) times daily before meals. Per sliding scale, before meals; blood sugar 200-250 add 3 units; 251-300 add 6 units; 301-350 add 9 units; 351-400 add 12 units; over 401 add 15 units.     . insulin glargine (LANTUS) 100 UNIT/ML injection Inject 0.7 mLs (70 Units total) into the skin at bedtime. (Patient taking differently: Inject 80 Units into the skin at bedtime. ) 10 mL 11   . lactulose (CHRONULAC) 10 GM/15ML solution Take 45 mLs (30 g total) by mouth 2 (two) times daily. (Patient taking differently: Take 20 g by mouth 2 (two) times daily. ) 240 mL 0   . omeprazole (PRILOSEC) 20 MG capsule Take  1 capsule (20 mg total) by mouth 2 (two) times daily before a meal. 60 capsule 1   . rifaximin (XIFAXAN) 550 MG TABS tablet Take 1 tablet (550 mg total) by mouth 2 (two) times daily. 60 tablet 1   . sertraline (ZOLOFT) 25 MG tablet TAKE ONE TABLET BY MOUTH EVERY DAY 30 tablet 0   . traMADol (ULTRAM) 50 MG tablet Take 1 tablet (50 mg total) by mouth every 8 (eight) hours as needed for moderate pain. 90 tablet 0   . glimepiride (AMARYL) 1 MG tablet TAKE ONE TABLET BY MOUTH EVERY DAY WITH BREAKFAST 30 tablet 0   . methocarbamol (ROBAXIN) 500 MG tablet Take 1 tablet (500 mg total) by mouth 3 (three) times daily as needed for muscle spasms. 90 tablet 0    Scheduled:  . allopurinol  100 mg Oral BID  . brimonidine  1 drop Left  Eye Q12H   And  . timolol  1 drop Left Eye Q12H  . cholecalciferol  1,000 Units Oral Daily  . cholestyramine  4 g Oral TID  . ferrous sulfate  325 mg Oral TID WC  . furosemide  40 mg Oral Daily  . insulin aspart  0-15 Units Subcutaneous TID WC  . insulin glargine  30 Units Subcutaneous QHS  . rifaximin  550 mg Oral BID  . sertraline  25 mg Oral Daily  . sodium chloride  3 mL Intravenous Q12H   Infusions:  . sodium chloride    . octreotide  (SANDOSTATIN)    IV infusion    . pantoprozole (PROTONIX) infusion 8 mg/hr (07/19/15 0750)    Assessment: Patient with liver disease, elevated INR, and GI bleed given Kcentra in ED.  Pharmacy consulted to monitor INR after administration of Kcentra.   Plan:  Kcentra 2,025 units given at 34:28 this AM. Per policy will check PT/INR 1 hour after administration, every 6 hours x 4, then daily x 2 days.  If INR > 1.5 during first 48 hours will contact MD for further orders.   Note if INR remains elevated, there is no indication to repeat Kcentra dose.  If INR > 1.5 60 minutes post-infusion, prescriber can consider FFP (2 units) If INR elevated > 6h after Kcentra, prescriber can consider repeating Vitamin K 5-10mg   However utility of KCentra and vitamin K may be limited in this patient as coagulopathy due to liver dysfunction. Patient not on anticoagulation prior to admission.   Rexene Edison, PharmD Clinical Pharmacist  07/19/2015,8:47 AM

## 2015-07-19 NOTE — ED Notes (Signed)
Family at bedside. 

## 2015-07-19 NOTE — ED Notes (Signed)
Patient transported to X-ray 

## 2015-07-19 NOTE — ED Notes (Signed)
Pt presents from home via ems from home with N/V since yesterday every 15-20 mins and weakness for one week. EMS BS 136 v/s stable. Pt appears jaundice in skin and eyes. Abd soft obesity and denies tenderness abd  Asymmetrical.

## 2015-07-19 NOTE — Plan of Care (Signed)
Problem: Discharge Progression Outcomes Goal: Other Discharge Outcomes/Goals Outcome: Progressing Patient is from home and uses and walker at home expects to be discharged home with wife   No c/o pain this shift Blood sugar was 45 earlier in shift, D50 given in 25mg  and BS came up to 94 Patient was given 1 unit of blood on the floor and 1 unit in the ED Sandostatin and Protonix drips infusing , along with D5 in NS at 2ml/hr VSS

## 2015-07-19 NOTE — Progress Notes (Signed)
[facility] NAME: [name]    MR#:  [id number]  DATE OF BIRTH:  04/14/1946  SUBJECTIVE:  CHIEF COMPLAINT:   Chief Complaint  Patient presents with  . Weakness  . Emesis   Feels little better. REVIEW OF SYSTEMS:  CONSTITUTIONAL: No fever, fatigue or weakness.  EYES: No blurred or double vision.  EARS, NOSE, AND THROAT: No tinnitus or ear pain.  RESPIRATORY: No cough, shortness of breath, wheezing or hemoptysis.  CARDIOVASCULAR: No chest pain, orthopnea, edema.  GASTROINTESTINAL:positive for nausea, vomiting, no diarrhea or abdominal pain.  GENITOURINARY: No dysuria, hematuria.  ENDOCRINE: No polyuria, nocturia,  HEMATOLOGY: No anemia, easy bruising or bleeding SKIN: No rash or lesion. MUSCULOSKELETAL: No joint pain or arthritis.   NEUROLOGIC: No tingling, numbness, weakness.  PSYCHIATRY: No anxiety or depression.   ROS  DRUG ALLERGIES:   Allergies  Allergen Reactions  . Actos [Pioglitazone] Anaphylaxis  . Aminophylline Other (See Comments)    Reaction: Unknown  . Amitriptyline Other (See Comments)    Reaction: Unknown   . Ampicillin Hives  . Hctz [Hydrochlorothiazide] Other (See Comments)    Reaction: Unknown   . Naproxen Other (See Comments)    Reaction: Unknown   . Penicillins Rash    rash    VITALS:  Blood pressure 132/60, pulse 72, temperature 97.6 F (36.4 C), temperature source Axillary, resp. rate 19, height 5\' 4"  (1.626 m), weight 76.658 kg (169 lb), SpO2 100 %.  PHYSICAL EXAMINATION:  GENERAL:  69 y.o.-year-old patient lying in the bed with no acute distress.  EYES: Pupils equal, round, reactive to light and accommodation. No scleral icterus. Extraocular muscles intact. Conjunctiva pale HEENT: Head atraumatic, normocephalic. Oropharynx and nasopharynx clear.  NECK:  Supple, no jugular venous distention. No thyroid enlargement, no tenderness.  LUNGS: Normal breath sounds bilaterally, no  wheezing, rales,rhonchi or crepitation. No use of accessory muscles of respiration.  CARDIOVASCULAR: S1, S2 normal. No murmurs, rubs, or gallops.  ABDOMEN: Soft, nontender, nondistended. Bowel sounds present. No organomegaly or mass.  EXTREMITIES: No pedal edema, cyanosis, or clubbing.  NEUROLOGIC: Cranial nerves II through XII are intact. Muscle strength 4/5 in all extremities. Sensation intact. Gait not checked. Generalized weakness. PSYCHIATRIC: The patient is alert and oriented x 3.  SKIN: No obvious rash, lesion, or ulcer.   Physical Exam LABORATORY PANEL:   CBC  Recent Labs Lab 07/19/15 0347  WBC 3.5*  HGB 6.7*  HCT 20.6*  PLT 53*   ------------------------------------------------------------------------------------------------------------------  Chemistries   Recent Labs Lab 07/19/15 0347  NA 139  K 3.5  CL 111  CO2 22  GLUCOSE 113*  BUN 17  CREATININE 0.63  CALCIUM 8.7*  MG 1.8  AST 60*  ALT 25  ALKPHOS 121  BILITOT 1.6*   ------------------------------------------------------------------------------------------------------------------  Cardiac Enzymes  Recent Labs Lab 07/19/15 0347  TROPONINI <0.03   ------------------------------------------------------------------------------------------------------------------  RADIOLOGY:  Dg Chest 1 View  07/19/2015   CLINICAL DATA:  Vomiting for 1 week.  EXAM: CHEST  1 VIEW  COMPARISON:  11/13/2014  FINDINGS: The cardiomediastinal contours are normal. Septal defect closure device in place. Pulmonary vasculature is normal. No consolidation, pleural effusion, or pneumothorax. No acute osseous abnormalities are seen.  IMPRESSION: No acute pulmonary process.   Electronically Signed   By: Jeb Levering M.D.   On: 07/19/2015 04:36   Dg Abd 2 Views  07/19/2015   CLINICAL DATA:  Nausea and vomiting since yesterday.  Weakness.  EXAM: ABDOMEN -  2 VIEW  COMPARISON:  CT 12/25/2014  FINDINGS: There is no free  intra-abdominal air. No dilated bowel loops to suggest obstruction. Small volume of stool throughout the colon. No radiopaque calculi. No acute osseous abnormalities are seen. There degenerative change of both hips.  IMPRESSION: Normal bowel gas pattern.  No free air.   Electronically Signed   By: Jeb Levering M.D.   On: 07/19/2015 04:34    ASSESSMENT AND PLAN:   This is a 69 year old Caucasian male admitted for intractable nausea and vomiting as well as severe anemia. 1. Nausea and vomiting: Decrease with the initiation of antiemetics.   on maintenance IV fluid. 2. 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 2 units of packed red blood cells of been ordered and are in the process of transfusion. He is hemodynamically stable. 3. 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 Gastroenterology consult placed. The patient is on IV Protonix. 4. Diabetes mellitus type 2: I place patient on a reduced dose of basal insulin coverage and will receive sliding scale coverage as well. I have held his oral hypoglycemic medication. 5. Nonalcoholic cirrhosis: Continue rifaximin and lactulose (the latter may continue after endoscopy) 6. Depression: Continue sertraline 7. DVT prophylaxis: SCDs 8. GI prophylaxis: As above  All the records are reviewed and case discussed with Care Management/Social Workerr. Management plans discussed with the patient, family and they are in agreement.  CODE STATUS: full.  TOTAL TIME TAKING CARE OF THIS PATIENT: 35 minutes.   POSSIBLE D/C IN 1-2 DAYS, DEPENDING ON CLINICAL CONDITION.   Vaughan Basta M.D on 07/19/2015   Between 7am to 6pm - Pager - [phone]  After 6pm go to [email or web address] - password [email or web address]  [facility]  Office  [phone]  CC: Primary care physician; No primary care provider on file.

## 2015-07-19 NOTE — Consult Note (Signed)
[facility]  [address] [place], [place] [address] Phone: [phone] Fax : [phone]  Consultation  Referring Provider:     No ref. provider found Primary Care Physician:  No primary care provider on file. Primary Gastroenterologist:  Unknown          Reason for Consultation:     Anemia with vomiting  Date of Admission:  07/19/2015 Date of Consultation:  07/19/2015         HPI:   [name]. is a 69 y.o. male who comes in with anemia and vomiting. [name]'s hemoglobin was low on admission. [name]'s wife who is deaf but able to read lips states that [name] has had bleeding Luke past with workup. [name] is not awake and she states he has been up most Luke night. He is hard to arouse. There is no report of any black stools or bloody stools but she states that he had some vomiting. Luke nausea and vomiting have been going on for last 2 days. 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 Luke wife reports of [name] has not had any abdominal pain associated with his anemia and vomiting. 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 In Luke past his anemia has been attributed to Luke cirrhosis.  Past Medical History  Diagnosis Date  . Stroke   . Hypertension   . COPD (chronic obstructive pulmonary disease)   . Diabetes mellitus without complication   . H/O hiatal hernia   . Depression   . Arthritis   . Hepatitis     NASH  . Cirrhosis of liver not due to alcohol   . Obstructive sleep apnea 11/20/2014    Past Surgical History  Procedure Laterality Date  . Nose surgery    . Appendectomy    . Cardiac catheterization    . Eye surgery      leaglly blind  . Anterior cervical decompression/discectomy fusion 4 levels N/A 11/13/2014    Procedure:  Cervical three-four, Cervical four-five, Cervical five-six, Cervical six- seven  anterior cervical decompression with fusion and interbody prosthesis plating and bonegraft;  Surgeon: Consuella Lose, MD;  Location: [facility];  Service: Neurosurgery;  Laterality: N/A;    Prior to Admission medications   Medication Sig Start Date End Date Taking? Authorizing Provider  acetaminophen (TYLENOL) 325 MG tablet Take 650 mg by mouth every 4 (four) hours as needed (Pain). 1-2 tablets every 4-6 hours as needed for pain.   Yes Historical Provider, MD  allopurinol (ZYLOPRIM) 100 MG tablet Take 1 tablet (100 mg total) by mouth 2 (two) times daily. 11/30/14  Yes Daniel J Angiulli, PA-C  brimonidine-timolol (COMBIGAN) 0.2-0.5 % ophthalmic solution Place 1 drop into Luke left eye every 12 (twelve) hours.   Yes Historical Provider, MD  Cholecalciferol (VITAMIN D-3) 1000 UNITS CAPS Take 1 capsule (1,000 Units total) by mouth daily. 11/30/14  Yes Daniel J Angiulli, PA-C  cholestyramine (QUESTRAN) 4 G packet Take 1 packet (4 g total) by mouth 3 (three) times daily. 11/30/14  Yes Daniel J Angiulli, PA-C  ferrous sulfate 325 (65 FE) MG tablet Take 1 tablet (325 mg total) by mouth 3 (three) times daily with meals. 11/30/14  Yes Daniel J Angiulli, PA-C  furosemide (LASIX) 40 MG tablet Take 1 tablet (40 mg total) by mouth daily. 11/30/14  Yes Lavon Paganini Angiulli, PA-C  insulin aspart (NOVOLOG) 100 UNIT/ML injection Inject 20-25 Units into Luke skin 3 (three) times daily before meals. 20 units before breakfast; 25 units before lunch and supper.   Yes Historical Provider, MD  insulin aspart (NOVOLOG) 100 UNIT/ML injection Inject 3-15 Units into Luke skin 3 (three) times daily before meals. Per sliding scale, before meals; blood sugar 200-250 add 3 units; 251-300 add 6 units; 301-350 add 9 units; 351-400 add 12 units; over 401 add 15 units.   Yes Historical Provider, MD  insulin glargine (LANTUS) 100 UNIT/ML injection Inject 0.7 mLs (70 Units total) into Luke skin at bedtime. [name] taking differently: Inject  80 Units into Luke skin at bedtime.  11/30/14  Yes Daniel J Angiulli, PA-C  lactulose (CHRONULAC) 10 GM/15ML solution Take 45 mLs (30 g total) by mouth 2 (two) times daily. [name] taking differently: Take 20 g by mouth 2 (two) times daily.  11/30/14  Yes Daniel J Angiulli, PA-C  omeprazole (PRILOSEC) 20 MG capsule Take 1 capsule (20 mg total) by mouth 2 (two) times daily before a meal. 11/30/14  Yes Daniel J Angiulli, PA-C  rifaximin (XIFAXAN) 550 MG TABS tablet Take 1 tablet (550 mg total) by mouth 2 (two) times daily. 11/30/14  Yes Daniel J Angiulli, PA-C  sertraline (ZOLOFT) 25 MG tablet TAKE ONE TABLET BY MOUTH EVERY DAY 01/22/15  Yes Meredith Staggers, MD  traMADol (ULTRAM) 50 MG tablet Take 1 tablet (50 mg total) by mouth every 8 (eight) hours as needed for moderate pain. 11/30/14  Yes Daniel J Angiulli, PA-C  glimepiride (AMARYL) 1 MG tablet TAKE ONE TABLET BY MOUTH EVERY DAY WITH BREAKFAST 01/22/15   Meredith Staggers, MD  methocarbamol (ROBAXIN) 500 MG tablet Take 1 tablet (500 mg total) by mouth 3 (three) times daily as needed for muscle spasms. 12/10/14   Meredith Staggers, MD    Family History  Problem Relation Age of Onset  . Kidney failure Sister     CKD  . Diabetes Mellitus II Sister      History  Substance Use Topics  . Smoking status: Never Smoker   . Smokeless tobacco: Not on file  . Alcohol Use: No    Allergies as of 07/19/2015 - Review Complete 07/19/2015  Allergen Reaction Noted  . Actos [pioglitazone] Anaphylaxis 09/26/2014  . Aminophylline Other (See Comments) 07/19/2015  . Amitriptyline Other (See Comments) 09/26/2014  . Ampicillin Hives 07/19/2015  . Hctz [hydrochlorothiazide] Other (See Comments) 09/26/2014  . Naproxen Other (See Comments) 09/26/2014  . Penicillins Rash 09/26/2014    Review of Systems:    All systems reviewed and negative except where noted in HPI.   Physical Exam:  Vital signs in last 24 hours: Temp:  [97.6 F (36.4 C)-98.2 F (36.8 C)]  97.6 F (36.4 C) (07/29 1340) Pulse Rate:  [72-95] 72 (07/29 1340) Resp:  [13-20] 19 (07/29 1340) BP: (117-157)/(60-72) 132/60 mmHg (07/29 1340) SpO2:  [97 %-100 %] 100 % (07/29 1340) Weight:  [169 lb (76.658 kg)-177 lb 9.6 oz (80.559 kg)] 169 lb (76.658 kg) (07/29 0806)   General:   Somnolent in NAD Head:  Normocephalic and atraumatic. Eyes:   No icterus.   Conjunctiva pink. PERRLA. Ears:  Normal auditory acuity. Neck:  Supple; no masses or thyroidomegaly Lungs: Respirations even and unlabored. Lungs clear to auscultation bilaterally.   No wheezes, crackles, or rhonchi.  Heart:  Regular rate and rhythm;  Without murmur, clicks, rubs or gallops Abdomen:  Soft, nondistended, nontender. Normal bowel sounds.  No appreciable masses or hepatomegaly.  No rebound or guarding.  Rectal:  Not performed. Msk:  Symmetrical without gross deformities.  Strength  Extremities:  Without edema, cyanosis or clubbing. Neurologic:  Alert and oriented x3;  grossly normal neurologically. Skin:  Intact without significant lesions or rashes. Cervical Nodes:  No significant cervical adenopathy. Psych:  Somnolent  LAB RESULTS:  Recent Labs  07/19/15 0347  WBC 3.5*  HGB 6.7*  HCT 20.6*  PLT 53*   BMET  Recent Labs  07/19/15 0347  NA 139  K 3.5  CL 111  CO2 22  GLUCOSE 113*  BUN 17  CREATININE 0.63  CALCIUM 8.7*   LFT  Recent Labs  07/19/15 0347  PROT 7.6  ALBUMIN 3.3*  AST 60*  ALT 25  ALKPHOS 121  BILITOT 1.6*   PT/INR  Recent Labs  07/19/15 0347 07/19/15 0835  LABPROT 28.8* 16.4*  INR 2.70 1.30    STUDIES: Dg Chest 1 View  07/19/2015   CLINICAL DATA:  Vomiting for 1 week.  EXAM: CHEST  1 VIEW  COMPARISON:  11/13/2014  FINDINGS: Luke cardiomediastinal contours are normal. Septal defect closure device in place. Pulmonary vasculature is normal. No consolidation, pleural effusion, or pneumothorax. No acute osseous abnormalities are seen.  IMPRESSION: No acute pulmonary  process.   Electronically Signed   By: Jeb Levering M.D.   On: 07/19/2015 04:36   Dg Abd 2 Views  07/19/2015   CLINICAL DATA:  Nausea and vomiting since yesterday.  Weakness.  EXAM: ABDOMEN - 2 VIEW  COMPARISON:  CT 12/25/2014  FINDINGS: There is no free intra-abdominal air. No dilated bowel loops to suggest obstruction. Small volume of stool throughout Luke colon. No radiopaque calculi. No acute osseous abnormalities are seen. There degenerative change of both hips.  IMPRESSION: Normal bowel gas pattern.  No free air.   Electronically Signed   By: Jeb Levering M.D.   On: 07/19/2015 04:34      Impression / Plan:   Luke POIRIER Sr. is a 69 y.o. y/o male with anemia and a history of cirrhosis due to nonalcoholic liver disease. [name] has significant anemia and is being transfused at Luke present time. [name]'s wife denies that he takes any anticancer medication. [name] will be set up for an upper endoscopy today. Luke patients wife has been explained Luke plan and agrees with it.   Thank you for involving me in Luke care of this [name].      LOS: 0 days   Ollen Bowl, MD  07/19/2015, 2:07 PM

## 2015-07-19 NOTE — ED Notes (Signed)
Pt has active black tar stool on underwear +hemmcult

## 2015-07-19 NOTE — ED Provider Notes (Addendum)
[facility] Emergency Department Provider Note  ____________________________________________  Time seen: Approximately 3:52 AM  I have reviewed the triage vital signs and the nursing notes.   HISTORY  Chief Complaint Weakness and Emesis    HPI Luke SCHUPP Sr. is a 69 y.o. male with HTN, DM type 2, NASH, COPDpresents today having had nausea with vomiting up food and yellow emesis for approximately one week. Patient states that he has been feeling nauseated and throwing up for the last week. Denies any other symptoms, and states he is not having any pain. He feels slightly nauseated now, and reports he was throwing up every 15-20 minutes this evening and from yesterday. He specifically denies any fevers or chills, no chest pain, no shortness of breath, no abdominal pain, no pain or burning with urination. He does report feeling weak in the legs and having difficulty walking because of lightheadedness over the last day. He called EMS today because he just felt so very weak after vomiting all day.   Past Medical History  Diagnosis Date  . Stroke   . Hypertension   . COPD (chronic obstructive pulmonary disease)   . Diabetes mellitus without complication   . H/O hiatal hernia   . Depression   . Arthritis   . Hepatitis     NASH  . Cirrhosis of liver not due to alcohol   . Obstructive sleep apnea 11/20/2014    Patient Active Problem List   Diagnosis Date Noted  . UTI (lower urinary tract infection) 11/23/2014  . Cognitive deficits 11/23/2014  . Thrombocytopenia 11/23/2014  . acute on chronic anemia 11/23/2014  . NASH (nonalcoholic steatohepatitis) 11/21/2014  . Stenosis of cervical spine region 11/20/2014  . Type 2 diabetes mellitus, uncontrolled, with retinopathy 11/20/2014  . History of COPD 11/20/2014  . Diabetes mellitus with diabetic retinopathy   . Acute respiratory acidosis   . Encounter for intubation   . Encounter for orogastric (OG) tube  placement   . Surgery, other elective   . Cervical spondylosis with myelopathy 11/13/2014    Past Surgical History  Procedure Laterality Date  . Nose surgery    . Appendectomy    . Cardiac catheterization    . Eye surgery      leaglly blind  . Anterior cervical decompression/discectomy fusion 4 levels N/A 11/13/2014    Procedure:  Cervical three-four, Cervical four-five, Cervical five-six, Cervical six- seven anterior cervical decompression with fusion and interbody prosthesis plating and bonegraft;  Surgeon: Consuella Lose, MD;  Location: [place] [facility];  Service: Neurosurgery;  Laterality: N/A;    Current Outpatient Rx  Name  Route  Sig  Dispense  Refill  . allopurinol (ZYLOPRIM) 100 MG tablet   Oral   Take 1 tablet (100 mg total) by mouth 2 (two) times daily.   60 tablet   1   . brimonidine-timolol (COMBIGAN) 0.2-0.5 % ophthalmic solution   Left Eye   Place 1 drop into the left eye every 12 (twelve) hours.         . Cholecalciferol (VITAMIN D-3) 1000 UNITS CAPS   Oral   Take 1 capsule (1,000 Units total) by mouth daily.   30 capsule   1   . cholestyramine (QUESTRAN) 4 G packet   Oral   Take 1 packet (4 g total) by mouth 3 (three) times daily.   60 each   12   . ferrous sulfate 325 (65 FE) MG tablet   Oral   Take 1  tablet (325 mg total) by mouth 3 (three) times daily with meals.   90 tablet   3   . furosemide (LASIX) 40 MG tablet   Oral   Take 1 tablet (40 mg total) by mouth daily.   30 tablet   1   . glimepiride (AMARYL) 1 MG tablet      TAKE ONE TABLET BY MOUTH EVERY DAY WITH BREAKFAST   30 tablet   0     Future refills to go to primary care MD   . insulin glargine (LANTUS) 100 UNIT/ML injection   Subcutaneous   Inject 0.7 mLs (70 Units total) into the skin at bedtime.   10 mL   11   . lactulose (CHRONULAC) 10 GM/15ML solution   Oral   Take 45 mLs (30 g total) by mouth 2 (two) times daily.   240 mL   0   . methocarbamol (ROBAXIN) 500 MG  tablet   Oral   Take 1 tablet (500 mg total) by mouth 3 (three) times daily as needed for muscle spasms.   90 tablet   0   . omeprazole (PRILOSEC) 20 MG capsule   Oral   Take 1 capsule (20 mg total) by mouth 2 (two) times daily before a meal.   60 capsule   1   . rifaximin (XIFAXAN) 550 MG TABS tablet   Oral   Take 1 tablet (550 mg total) by mouth 2 (two) times daily.   60 tablet   1   . sertraline (ZOLOFT) 25 MG tablet      TAKE ONE TABLET BY MOUTH EVERY DAY   30 tablet   0   . traMADol (ULTRAM) 50 MG tablet   Oral   Take 1 tablet (50 mg total) by mouth every 8 (eight) hours as needed for moderate pain.   90 tablet   0     Allergies Actos; Amitriptyline; Hctz; Naproxen; and Penicillins  History reviewed. No pertinent family history.  Social History History  Substance Use Topics  . Smoking status: Never Smoker   . Smokeless tobacco: Not on file  . Alcohol Use: No    Review of Systems Constitutional: No fever/chills Eyes: No visual changes. ENT: No sore throat. Cardiovascular: Denies chest pain. Respiratory: Denies shortness of breath. Gastrointestinal: see history of present illness  No diarrhea.  No constipation. Denies dark or bloody stools. No blood in his vomit. Genitourinary: Negative for dysuria. Musculoskeletal: Negative for back pain. Skin: Negative for rash. Neurological: Negative for headaches, focal weakness or numbness.  10-point ROS otherwise negative.  ____________________________________________   PHYSICAL EXAM:  VITAL SIGNS: ED Triage Vitals  Enc Vitals Group     BP 07/19/15 0341 157/72 mmHg     Pulse Rate 07/19/15 0341 92     Resp 07/19/15 0341 16     Temp 07/19/15 0341 98.2 F (36.8 C)     Temp Source 07/19/15 0341 Oral     SpO2 07/19/15 0341 100 %     Weight 07/19/15 0341 177 lb 9.6 oz (80.559 kg)     Height 07/19/15 0341 5\' 4"  (1.626 m)     Head Cir --      Peak Flow --      Pain Score --      Pain Loc --      Pain  Edu? --      Excl. in [place]? --     Constitutional: Alert and oriented. Fatigued appearing but no distress. Sitting  upright with soft voice but conversant and well oriented. He is slightly hard of hearing. Eyes: Conjunctivae are normal. PERRL. EOMI. Head: Atraumatic. Nose: No congestion/rhinnorhea. Mouth/Throat: Mucous membranes are dry.  Oropharynx non-erythematous. Neck: No stridor.   Cardiovascular: Normal rate, regular rhythm. Grossly normal heart sounds.  Good peripheral circulation. Respiratory: Normal respiratory effort.  No retractions. Lungs CTAB. Gastrointestinal: Soft and nontender. No abdominal bruits. No CVA tenderness. The patient has a soft and evident ventral hernia with sitting up that goes away with laying back. No groin masses. His abdominal exam is very benign except for some soft distention. Absolutely no pain to palpation in all quadrants. Musculoskeletal: No lower extremity tenderness nor edema.  No joint effusions. Neurologic:  Normal speech and language. No gross focal neurologic deficits are appreciated. No pronator drift. No facial droop. Moves all extremities with 5 out of 5 strength to command. 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 Skin:  Skin is warm, dry and intact. No rash noted. Psychiatric: Mood and affect are normal. Speech and behavior are normal.  ____________________________________________   LABS (all labs ordered are listed, but only abnormal results are displayed)  Labs Reviewed  GLUCOSE, CAPILLARY - Abnormal; Notable for the following:    Glucose-Capillary 108 (*)    All other components within normal limits  CBC WITH DIFFERENTIAL/PLATELET - Abnormal; Notable for the following:    WBC 3.5 (*)    RBC 2.16 (*)    Hemoglobin 6.7 (*)    HCT 20.6 (*)    RDW 18.1 (*)    Platelets 53 (*)    Lymphs Abs 0.4 (*)    All other components within normal limits  COMPREHENSIVE METABOLIC PANEL -  Abnormal; Notable for the following:    Glucose, Bld 113 (*)    Calcium 8.7 (*)    Albumin 3.3 (*)    AST 60 (*)    Total Bilirubin 1.6 (*)    All other components within normal limits  PROTIME-INR - Abnormal; Notable for the following:    Prothrombin Time 28.8 (*)    All other components within normal limits  APTT - Abnormal; Notable for the following:    aPTT 44 (*)    All other components within normal limits  LIPASE, BLOOD  TROPONIN I  MAGNESIUM  URINALYSIS COMPLETEWITH MICROSCOPIC ([facility] ONLY)  CBG MONITORING, ED  TYPE AND SCREEN  PREPARE RBC (CROSSMATCH)   ____________________________________________  EKG  ED ECG REPORT I, Jobeth Pangilinan, the attending physician, personally viewed and interpreted this ECG.  Date: 07/19/2015 EKG Time: 345 Rate: 90 Rhythm: normal sinus rhythm QRS Axis: normal Intervals: prolonged QTC ST/T Wave abnormalities: normal Conduction Disutrbances: none Narrative Interpretation: normal EKG except for Q in lead 3 and prolonged QTC  ____________________________________________  RADIOLOGY  DG Abd 2 Views (Final result) Result time: 07/19/15 04:34:16   Final result by Rad Results In Interface (07/19/15 04:34:16)   Narrative:   CLINICAL DATA: Nausea and vomiting since yesterday. Weakness.  EXAM: ABDOMEN - 2 VIEW  COMPARISON: CT 12/25/2014  FINDINGS: There is no free intra-abdominal air. No dilated bowel loops to suggest obstruction. Small volume of stool throughout the colon. No radiopaque calculi. No acute osseous abnormalities are seen. There degenerative change of both hips.  IMPRESSION: Normal bowel gas pattern. No free air.   Electronically Signed By: Jeb Levering M.D. On: 07/19/2015 04:34          DG Chest 1 View (Final result) Result  time: 07/19/15 04:36:09   Final result by Rad Results In Interface (07/19/15 04:36:09)   Narrative:   CLINICAL DATA: Vomiting for 1 week.  EXAM: CHEST 1  VIEW  COMPARISON: 11/13/2014  FINDINGS: The cardiomediastinal contours are normal. Septal defect closure device in place. Pulmonary vasculature is normal. No consolidation, pleural effusion, or pneumothorax. No acute osseous abnormalities are seen.  IMPRESSION: No acute pulmonary process.    ____________________________________________   PROCEDURES  Procedure(s) performed: None  Critical Care performed: yes  CRITICAL CARE Performed by: Delman Kitten   Total critical care time: 35  Critical care time was exclusive of separately billable procedures and treating other patients.  Critical care was necessary to treat or prevent imminent or life-threatening deterioration.  Critical care was time spent personally by me on the following activities: development of treatment plan with patient and/or surrogate as well as nursing, discussions with consultants, evaluation of patient's response to treatment, examination of patient, obtaining history from patient or surrogate, ordering and performing treatments and interventions, ordering and review of laboratory studies, ordering and review of radiographic studies, pulse oximetry and re-evaluation of patient's condition.  Patient presenting with acute blood loss anemia and evidence of active GI bleeding with a history of cirrhosis and gastropathy. He is requiring initiation of blood transfusion for acute blood loss anemia, PPI drip as well as octreotide here in the ER to prevent life-threatening hemorrhage. ____________________________________________   INITIAL IMPRESSION / ASSESSMENT AND PLAN / ED COURSE  Pertinent labs & imaging results that were available during my care of the patient were reviewed by me and considered in my medical decision making (see chart for details).  Nausea and vomiting for one week worsening in the last day. Stable vitals and reassuring examination of the abdomen with some slight nontender distention. Does  have a complicated history of diabetes and Karlene Lineman. It is immediately not clear what is causing his nausea and vomiting, but his exam of the abdomen is very reassuring. No cardiopulmonary symptoms. No acute neurologic abnormalities. I will obtain basic labs, EKG and troponin, as well as abdominal labs lipase and abdominal x-rays as we continue to rule out etiologies including obstruction, pancreatitis, gastroenteritis, urinary tract infection, perforation, etc. I would find pancreatitis, or acute intra-abdominal infections very unlikely given that he is pain-free and afebrile. We will treat him with Phenergan and hydration for now, avoiding QT prolonging medications.  ----------------------------------------- 4:40 AM on 07/19/2015 -----------------------------------------  Rectal exam shows dark heme positive stool. Review of records and discussion with his wife via writing on paper, demonstrates that he has a history of prior GI bleeding and per Dr. 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 I do not see a note patient of [facility] Mood sees in the past, but I have paged gastroenterology for ongoing treatment recommendations. I discussed consent for blood product with the patient who verbalizes consent after discussion of risks and benefits. We will have him sign consent as well. As his treating physician I believe that the benefits of transfusion outweigh the risks given active bleeding and anemia.  ----------------------------------------- 4:49 AM on 07/19/2015 -----------------------------------------  Discussed with Dr. Candace Cruise of gastroenterology. He recommends based on the patient's previous history and EGD initiation of pantoprazole drip and octreotide drip. Discussed with the hospitalist to admission. Patient is aware of plan of care awaiting blood products. Hemodynamically remaining stable, but with acute GI bleeding, history of cirrhosis as we await PT PTT, now initiating treatment with PPIs and octreotide  drip. ____________________________________________   FINAL  CLINICAL IMPRESSION(S) / ED DIAGNOSES  Final diagnoses:  Non-intractable vomiting with nausea, vomiting of unspecified type  Dehydration symptoms  Acute gastrointestinal hemorrhage      Delman Kitten, MD 07/19/15 0452  ----------------------------------------- 5:15 AM on 07/19/2015 -----------------------------------------  INR is also elevated to 2.7. Given the patient's active bleeding, I have called Dr. Candace Cruise to discussed. Anticipate Kcentra for reversal. Also discussed with pharmacist Matt. We will giveKcentra for reversal of INR of 2.7.  Delman Kitten, MD 07/19/15 [phone]

## 2015-07-20 ENCOUNTER — Inpatient Hospital Stay: Payer: Medicare Other | Admitting: Anesthesiology

## 2015-07-20 ENCOUNTER — Encounter: Payer: Self-pay | Admitting: Anesthesiology

## 2015-07-20 ENCOUNTER — Encounter
Admission: EM | Disposition: A | Discharge status: Home / Self Care | Payer: Self-pay | Source: Home / Self Care | Attending: Specialist

## 2015-07-20 DIAGNOSIS — D509 Iron deficiency anemia, unspecified: Secondary | ICD-10-CM | POA: Insufficient documentation

## 2015-07-20 DIAGNOSIS — K2970 Gastritis, unspecified, without bleeding: Secondary | ICD-10-CM | POA: Insufficient documentation

## 2015-07-20 DIAGNOSIS — K317 Polyp of stomach and duodenum: Secondary | ICD-10-CM | POA: Insufficient documentation

## 2015-07-20 HISTORY — PX: ESOPHAGOGASTRODUODENOSCOPY (EGD) WITH PROPOFOL: SHX5813

## 2015-07-20 LAB — TYPE AND SCREEN
ABO/RH(D): AB POS
ANTIBODY SCREEN: NEGATIVE
UNIT DIVISION: 0
Unit division: 0

## 2015-07-20 LAB — CBC
HCT: 24.2 % — ABNORMAL LOW (ref 40.0–52.0)
Hemoglobin: 8.1 g/dL — ABNORMAL LOW (ref 13.0–18.0)
MCH: 31.6 pg (ref 26.0–34.0)
MCHC: 33.7 g/dL (ref 32.0–36.0)
MCV: 93.6 fL (ref 80.0–100.0)
PLATELETS: 53 10*3/uL — AB (ref 150–440)
RBC: 2.58 MIL/uL — ABNORMAL LOW (ref 4.40–5.90)
RDW: 17.8 % — ABNORMAL HIGH (ref 11.5–14.5)
WBC: 4.5 10*3/uL (ref 3.8–10.6)

## 2015-07-20 LAB — PROTIME-INR
INR: 1.18
Prothrombin Time: 15.2 seconds — ABNORMAL HIGH (ref 11.4–15.0)

## 2015-07-20 LAB — GLUCOSE, CAPILLARY
Glucose-Capillary: 96 mg/dL (ref 65–99)
Glucose-Capillary: 129 mg/dL — ABNORMAL HIGH (ref 65–99)
Glucose-Capillary: 231 mg/dL — ABNORMAL HIGH (ref 65–99)
Glucose-Capillary: 191 mg/dL — ABNORMAL HIGH (ref 65–99)

## 2015-07-20 SURGERY — ESOPHAGOGASTRODUODENOSCOPY (EGD) WITH PROPOFOL
Anesthesia: General

## 2015-07-20 MED ORDER — PROPOFOL INFUSION 10 MG/ML OPTIME
INTRAVENOUS | Status: DC | PRN
  Administered 2015-07-20: 75 ug/kg/min via INTRAVENOUS

## 2015-07-20 MED ORDER — LIDOCAINE HCL (CARDIAC) 20 MG/ML IV SOLN
INTRAVENOUS | Status: DC | PRN
  Administered 2015-07-20: 50 mg via INTRAVENOUS

## 2015-07-20 MED ORDER — SODIUM CHLORIDE 0.9 % IV SOLN
INTRAVENOUS | Status: DC | PRN
  Administered 2015-07-20: 09:00:00 via INTRAVENOUS

## 2015-07-20 MED ORDER — PROPOFOL 10 MG/ML IV BOLUS
INTRAVENOUS | Status: DC | PRN
  Administered 2015-07-20: 70 mg via INTRAVENOUS

## 2015-07-20 MED ORDER — LACTULOSE 10 GM/15ML PO SOLN
30.0000 g | Freq: Every day | ORAL | Status: DC
  Administered 2015-07-20 – 2015-07-21 (×2): 30 g via ORAL
  Filled 2015-07-20 (×2): qty 60

## 2015-07-20 MED ORDER — INSULIN ASPART 100 UNIT/ML ~~LOC~~ SOLN
0.0000 [IU] | Freq: Three times a day (TID) | SUBCUTANEOUS | Status: DC
  Administered 2015-07-20: 5 [IU] via SUBCUTANEOUS
  Administered 2015-07-21: 3 [IU] via SUBCUTANEOUS
  Filled 2015-07-20: qty 3
  Filled 2015-07-20: qty 5

## 2015-07-20 MED ORDER — GLYCOPYRROLATE 0.2 MG/ML IJ SOLN
INTRAMUSCULAR | Status: DC | PRN
  Administered 2015-07-20: 0.2 mg via INTRAVENOUS

## 2015-07-20 MED ORDER — FENTANYL CITRATE (PF) 100 MCG/2ML IJ SOLN: 25.0000 ug | INTRAMUSCULAR | Status: DC | PRN

## 2015-07-20 MED ORDER — ONDANSETRON HCL 4 MG/2ML IJ SOLN: 4.0000 mg | Freq: Once | INTRAMUSCULAR | Status: AC | PRN

## 2015-07-20 MED ORDER — INSULIN ASPART 100 UNIT/ML ~~LOC~~ SOLN: 0.0000 [IU] | Freq: Every day | SUBCUTANEOUS | Status: DC

## 2015-07-20 NOTE — Anesthesia Postprocedure Evaluation (Signed)
  Anesthesia Post-op Note  Patient: Luke GORUM Sr.  Procedure(s) Performed: Procedure(s): ESOPHAGOGASTRODUODENOSCOPY (EGD) WITH PROPOFOL (N/A)  Anesthesia type:General  Patient location: PACU  Post pain: Pain level controlled  Post assessment: Post-op Vital signs reviewed, Patient's Cardiovascular Status Stable, Respiratory Function Stable, Patent Airway and No signs of Nausea or vomiting  Post vital signs: Reviewed and stable  Last Vitals:  Filed Vitals:   07/20/15 0930  BP: 112/62  Pulse: 67  Temp: 36.2 C  Resp: 16    Level of consciousness: awake, alert  and patient cooperative  Complications: No apparent anesthesia complications

## 2015-07-20 NOTE — Plan of Care (Signed)
Problem: Discharge Progression Outcomes Goal: Other Discharge Outcomes/Goals Outcome: Progressing Patient is alert to self, no c/o pain. Low blood sugar at 64, 25 ml of @50  given, increasing blood sugar to 127. Continue with Protonix drip, Octeotride, Dextrose with NS. Strict bedrest continued. No nausea, vomitting, diarrhea. Resting quietly.

## 2015-07-20 NOTE — Plan of Care (Signed)
Problem: Discharge Progression Outcomes Goal: Other Discharge Outcomes/Goals Outcome: Progressing Plan of care progress to goal for: 1. Pain-no c/o pain this shift 2. Hemodynamically-             -VSS, pt remains afebrile this shift 3. Complications-no evidence of this shift 4. Diet-pt tolerating diet this shift 5. Activity-pt is on bedrest, but up to [facility] with assistance

## 2015-07-20 NOTE — Transfer of Care (Signed)
Immediate Anesthesia Transfer of Care Note  Patient: Luke BRUMBAUGH Sr.  Procedure(s) Performed: Procedure(s): ESOPHAGOGASTRODUODENOSCOPY (EGD) WITH PROPOFOL (N/A)  Patient Location: PACU and Endoscopy Unit  Anesthesia Type:General  Level of Consciousness: awake, alert  and oriented  Airway & Oxygen Therapy: Patient Spontanous Breathing and Patient connected to nasal cannula oxygen  Post-op Assessment: Report given to RN and Post -op Vital signs reviewed and stable  Post vital signs: Reviewed and stable  Last Vitals:  Filed Vitals:   07/20/15 0930  BP: 112/62  Pulse: 67  Temp: 36.2 C  Resp: 16    Complications: No apparent anesthesia complications

## 2015-07-20 NOTE — Progress Notes (Signed)
[facility] [facility] [place] NAME: [name]    MR#:  [id number]  DATE OF BIRTH:  08/14/1946  SUBJECTIVE:  CHIEF COMPLAINT:   Chief Complaint  Patient presents with  . Weakness  . Emesis   Patient here due to nausea and vomiting. Status post endoscopy today showing no evidence of acute bleeding but just some mild gastritis. Patient is hungry and wants to eat. Hemoglobin has improved posttransfusion.  REVIEW OF SYSTEMS:    Review of Systems  Constitutional: Negative for fever and chills.  HENT: Negative for congestion and tinnitus.   Eyes: Negative for blurred vision and double vision.  Respiratory: Negative for cough, shortness of breath and wheezing.   Cardiovascular: Negative for chest pain, orthopnea and PND.  Gastrointestinal: Negative for nausea, vomiting, abdominal pain and diarrhea.  Genitourinary: Negative for dysuria and hematuria.  Neurological: Negative for dizziness, sensory change and focal weakness.  All other systems reviewed and are negative.   Nutrition: Heart healthy/carb control Tolerating Diet: Yes   DRUG ALLERGIES:   Allergies  Allergen Reactions  . Actos [Pioglitazone] Anaphylaxis  . Aminophylline Other (See Comments)    Reaction: Unknown  . Amitriptyline Other (See Comments)    Reaction: Unknown   . Ampicillin Hives  . Hctz [Hydrochlorothiazide] Other (See Comments)    Reaction: Unknown   . Naproxen Other (See Comments)    Reaction: Unknown   . Penicillins Rash    rash    VITALS:  Blood pressure 144/59, pulse 73, temperature 97.7 F (36.5 C), temperature source Oral, resp. rate 18, height 5\' 4"  (1.626 m), weight 76.658 kg (169 lb), SpO2 99 %.  PHYSICAL EXAMINATION:   Physical Exam  GENERAL:  69 y.o.-year-old patient lying in the bed with no acute distress.  EYES: Pupils equal, round, reactive to light and accommodation. No scleral icterus. Extraocular muscles intact.  HEENT: Head  atraumatic, normocephalic. Oropharynx and nasopharynx clear.  NECK:  Supple, no jugular venous distention. No thyroid enlargement, no tenderness.  LUNGS: Normal breath sounds bilaterally, no wheezing, rales, rhonchi. No use of accessory muscles of respiration.  CARDIOVASCULAR: S1, S2 RRR. No murmurs, rubs, or gallops.  ABDOMEN: Soft, nontender, nondistended. Bowel sounds present. No organomegaly or mass.  EXTREMITIES: No cyanosis, clubbing or edema b/l.    NEUROLOGIC: Cranial nerves II through XII are intact. No focal Motor or sensory deficits b/l.   PSYCHIATRIC: The patient is alert and oriented x 3. Good affect SKIN: No obvious rash, lesion, or ulcer.    LABORATORY PANEL:   CBC  Recent Labs Lab 07/19/15 2049  WBC 4.5  HGB 8.1*  HCT 24.2*  PLT 53*   ------------------------------------------------------------------------------------------------------------------  Chemistries   Recent Labs Lab 07/19/15 0347  NA 139  K 3.5  CL 111  CO2 22  GLUCOSE 113*  BUN 17  CREATININE 0.63  CALCIUM 8.7*  MG 1.8  AST 60*  ALT 25  ALKPHOS 121  BILITOT 1.6*   ------------------------------------------------------------------------------------------------------------------  Cardiac Enzymes  Recent Labs Lab 07/19/15 0347  TROPONINI <0.03   ------------------------------------------------------------------------------------------------------------------  RADIOLOGY:  Dg Chest 1 View  07/19/2015   CLINICAL DATA:  Vomiting for 1 week.  EXAM: CHEST  1 VIEW  COMPARISON:  11/13/2014  FINDINGS: The cardiomediastinal contours are normal. Septal defect closure device in place. Pulmonary vasculature is normal. No consolidation, pleural effusion, or pneumothorax. No acute osseous abnormalities are seen.  IMPRESSION: No acute pulmonary process.   Electronically Signed   By: Threasa Beards  Ehinger M.D.   On: 07/19/2015 04:36   Dg Abd 2 Views  07/19/2015   CLINICAL DATA:  Nausea and vomiting  since yesterday.  Weakness.  EXAM: ABDOMEN - 2 VIEW  COMPARISON:  CT 12/25/2014  FINDINGS: There is no free intra-abdominal air. No dilated bowel loops to suggest obstruction. Small volume of stool throughout the colon. No radiopaque calculi. No acute osseous abnormalities are seen. There degenerative change of both hips.  IMPRESSION: Normal bowel gas pattern.  No free air.   Electronically Signed   By: Jeb Levering M.D.   On: 07/19/2015 04:34     ASSESSMENT AND PLAN:   69 year old male with past medical history of liver cirrhosis, hypertension, history of previous CVA, COPD, diabetes type 2 without consultation, depression, nonalcoholic steatohepatitis, obstructive sleep apnea who presented to the hospital due to nausea vomiting and noted to be anemic.   #1 anemia-acute on chronic. Patient presented to the hospital with a hemoglobin of 6.7. Transfused a total of 2 units and hemoglobin has improved to 8.1. Patient has had no evidence of acute bleeding. -Given patient's history of liver cirrhosis patient underwent an upper GI endoscopy today which showed no evidence of acute bleeding other than some mild gastritis. -Patient's anemia could be secondary to bone marrow suppression from his history of alcohol abuse and liver cirrhosis. There has been no evidence of acute GI bleeding.  #2 suspected GI bleed-patient was thought to have a possibly upper GI bleed given his intractable nausea vomiting which she presented with. -Patient was placed on octreotide/Protonix drip. Patient is status post endoscopy today showing some mild gastritis but no evidence of acute bleeding. -Discussed with GI and we'll DC octreotide/Protonix drip. Start patient on a regular diet and repeat hemoglobin in the morning.  #3 history of gout-continue allopurinol.  #4 history of glaucoma-continue timolol and brimonidine eyedrops.  #5 history of liver cirrhosis due to nonalcoholic steatohepatitis-no evidence of acute hepatic  encephalopathy. -Continue Xifaxan.  #6 depression-continue Zoloft.  #7 type 2 diabetes without complication-continue Lantus, sliding scale insulin.    All the records are reviewed and case discussed with Care Management/Social Workerr. Management plans discussed with the patient, family and they are in agreement.  CODE STATUS: Full  DVT Prophylaxis: SCD and teds  TOTAL TIME TAKING CARE OF THIS PATIENT: 30 minutes.   POSSIBLE D/C tomorrow morning if hemoglobin stable.   Henreitta Leber M.D on 07/20/2015 at 1:11 PM  Between 7am to 6pm - Pager - [phone]  After 6pm go to [email or web address] - password [email or web address]  [facility]  Office  [phone]  CC: Primary care physician; No primary care provider on file.

## 2015-07-20 NOTE — Anesthesia Preprocedure Evaluation (Addendum)
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   Abdominal Normal abdominal exam  (+)   Peds  Hematology  (+) anemia , Hx of thrombocytopenia   Anesthesia Other Findings   Reproductive/Obstetrics                           Anesthesia Physical Anesthesia Plan  ASA: III and emergent  Anesthesia Plan: General   Post-op Pain Management:    Induction: Intravenous  Airway Management Planned: Nasal Cannula  Additional Equipment:   Intra-op Plan:   Post-operative Plan:   Informed Consent: I have reviewed the patients History and Physical, chart, labs and discussed the procedure including the risks, benefits and alternatives for the proposed anesthesia with the patient or authorized representative who has indicated his/her understanding and acceptance.   Dental advisory given  Plan Discussed with: CRNA and Surgeon  Anesthesia Plan Comments:        Anesthesia Quick Evaluation

## 2015-07-20 NOTE — Op Note (Signed)
[facility] Gastroenterology Patient Name: [name] Procedure Date: 07/20/2015 8:53 AM MRN: [id number] Account #: [id number] Date of Birth: Jul 01, 1946 Admit Type: Inpatient Age: 69 Room: [facility] ENDO ROOM 4 Gender: Male Note Status: Finalized Procedure:         Upper GI endoscopy Indications:       Iron deficiency anemia Providers:         Lucilla Lame, MD Referring MD:      Youlanda Roys. Ola Spurr, MD (Referring MD) Medicines:         Propofol per Anesthesia Complications:     No immediate complications. Procedure:         Pre-Anesthesia Assessment:                    - Prior to the procedure, a History and Physical was                     performed, and patient medications and allergies were                     reviewed. The patient's tolerance of previous anesthesia                     was also reviewed. The risks and benefits of the procedure                     and the sedation options and risks were discussed with the                     patient. All questions were answered, and informed consent                     was obtained. Prior Anticoagulants: The patient has taken                     no previous anticoagulant or antiplatelet agents. ASA                     Grade Assessment: III - A patient with severe systemic                     disease. After reviewing the risks and benefits, the                     patient was deemed in satisfactory condition to undergo                     the procedure.                    After obtaining informed consent, the endoscope was passed                     under direct vision. Throughout the procedure, the                     patient's blood pressure, pulse, and oxygen saturations                     were monitored continuously. The Endoscope was introduced                     through the mouth, and advanced to the second part of  duodenum. The upper GI endoscopy was accomplished without        difficulty. The patient tolerated the procedure well. Findings:      The examined esophagus was normal.      Diffuse mild inflammation was found in the entire examined stomach.      A few 5 mm sessile polyps with no bleeding and no stigmata of recent       bleeding were found in the gastric antrum. Biopsies were taken with a       cold forceps for histology.      The examined duodenum was normal. Impression:        - Normal esophagus.                    - Gastritis.                    - A few gastric polyps. Biopsied.                    - Normal examined duodenum. Recommendation:    - Await pathology results. Procedure Code(s): --- Professional ---                    [id number], Esophagogastroduodenoscopy, flexible, transoral;                     with biopsy, single or multiple Diagnosis Code(s): --- Professional ---                    D50.9, Iron deficiency anemia, unspecified                    K31.7, Polyp of stomach and duodenum                    K29.70, Gastritis, unspecified, without bleeding CPT copyright 2014 American Medical Association. All rights reserved. The codes documented in this report are preliminary and upon coder review may  be revised to meet current compliance requirements. Lucilla Lame, MD 07/20/2015 9:23:34 AM This report has been signed electronically. Number of Addenda: 0 Note Initiated On: 07/20/2015 8:53 AM      [facility]

## 2015-07-21 LAB — CBC
HEMATOCRIT: 24.8 % — AB (ref 40.0–52.0)
HEMOGLOBIN: 8.3 g/dL — AB (ref 13.0–18.0)
MCH: 31.4 pg (ref 26.0–34.0)
MCHC: 33.3 g/dL (ref 32.0–36.0)
MCV: 94.2 fL (ref 80.0–100.0)
PLATELETS: 53 10*3/uL — AB (ref 150–440)
RBC: 2.63 MIL/uL — AB (ref 4.40–5.90)
RDW: 18.2 % — ABNORMAL HIGH (ref 11.5–14.5)
WBC: 4.5 10*3/uL (ref 3.8–10.6)

## 2015-07-21 LAB — PROTIME-INR
INR: 1.24
PROTHROMBIN TIME: 15.8 s — AB (ref 11.4–15.0)

## 2015-07-21 LAB — GLUCOSE, CAPILLARY: Glucose-Capillary: 165 mg/dL — ABNORMAL HIGH (ref 65–99)

## 2015-07-21 NOTE — Discharge Summary (Signed)
[place] [facility] [place] NAME: [name]    MR#:  [id number]  DATE OF BIRTH:  October 31, 1946  DATE OF ADMISSION:  07/19/2015 ADMITTING PHYSICIAN: Harrie Foreman, MD  DATE OF DISCHARGE: 07/21/2015 10:40 AM  PRIMARY CARE PHYSICIAN: Adrian Prows, MD    ADMISSION DIAGNOSIS:  Acute gastrointestinal hemorrhage [K92.2] Vomiting [R11.10] Dehydration symptoms [R63.8] Non-intractable vomiting with nausea, vomiting of unspecified type [R11.2]  DISCHARGE DIAGNOSIS:  Active Problems:   Upper GI bleed   Iron deficiency anemia   Gastric polyp   Gastritis   SECONDARY DIAGNOSIS:   Past Medical History  Diagnosis Date  . Stroke   . Hypertension   . COPD (chronic obstructive pulmonary disease)   . Diabetes mellitus without complication   . H/O hiatal hernia   . Depression   . Arthritis   . Hepatitis     NASH  . Cirrhosis of liver not due to alcohol   . Obstructive sleep apnea 11/20/2014    HOSPITAL COURSE:   69 year old male with past medical history of liver cirrhosis, hypertension, history of previous CVA, COPD, diabetes type 2 without consultation, depression, nonalcoholic steatohepatitis, obstructive sleep apnea who presented to the hospital due to nausea vomiting and noted to be anemic.   #1 anemia-acute on chronic. Patient presented to the hospital with a hemoglobin of 6.7. Transfused a total of 2 units and hemoglobin has improved to 8.1. Patient has had no evidence of acute bleeding. -Given patient's history of liver cirrhosis patient underwent an upper GI endoscopy which showed no evidence of acute bleeding other than some mild gastritis. -Patient's anemia could be secondary to bone marrow suppression from his history of alcohol abuse and liver cirrhosis. There has been no evidence of acute GI bleeding. -His hemoglobin remained stable and therefore he is being discharged home.  #2 suspected GI bleed-patient was thought to  have a possibly upper GI bleed given his intractable nausea vomiting which he presented with. -Patient was initially placed on octreotide/Protonix drip. Patient was then taken off the drips as upper GI endoscopy showed no evidence of variceal bleeding but just mild gastritis. -Patient's diet was slowly advanced from liquid to a soft diet which she is tolerating now without any evidence of nausea vomiting or any abdominal pain. His hemoglobin has remained stable. He will continue omeprazole twice daily.  #3 history of gout-continue allopurinol. -There was no acute attack while in the hospital  #4 history of glaucoma-continue timolol and brimonidine eyedrops.  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 -Continue Xifaxan, lactulose  #6 depression-continue Zoloft.  #7 type 2 diabetes without complication-no evidence of hypoglycemia and patient will continue his Lantus, NovoLog with meals.  DISCHARGE CONDITIONS:   Stable  CONSULTS OBTAINED:  Treatment Team:  Lucilla Lame, MD  DRUG ALLERGIES:   Allergies  Allergen Reactions  . Actos [Pioglitazone] Anaphylaxis  . Aminophylline Other (See Comments)    Reaction: Unknown  . Amitriptyline Other (See Comments)    Reaction: Unknown   . Ampicillin Hives  . Hctz [Hydrochlorothiazide] Other (See Comments)    Reaction: Unknown   . Naproxen Other (See Comments)    Reaction: Unknown   . Penicillins Rash    rash    DISCHARGE MEDICATIONS:   Discharge Medication List as of 07/21/2015  9:59 AM    CONTINUE these medications which have NOT CHANGED   Details  acetaminophen (TYLENOL) 325 MG tablet Take 650 mg  by mouth every 4 (four) hours as needed (Pain). 1-2 tablets every 4-6 hours as needed for pain., Until Discontinued, Historical Med    allopurinol (ZYLOPRIM) 100 MG tablet Take 1 tablet (100 mg total) by mouth 2 (two) times daily., Starting 11/30/2014, Until Discontinued,  Print    brimonidine-timolol (COMBIGAN) 0.2-0.5 % ophthalmic solution Place 1 drop into the left eye every 12 (twelve) hours., Until Discontinued, Historical Med    Cholecalciferol (VITAMIN D-3) 1000 UNITS CAPS Take 1 capsule (1,000 Units total) by mouth daily., Starting 11/30/2014, Until Discontinued, Print    cholestyramine (QUESTRAN) 4 G packet Take 1 packet (4 g total) by mouth 3 (three) times daily., Starting 11/30/2014, Until Discontinued, Print    ferrous sulfate 325 (65 FE) MG tablet Take 1 tablet (325 mg total) by mouth 3 (three) times daily with meals., Starting 11/30/2014, Until Discontinued, Print    furosemide (LASIX) 40 MG tablet Take 1 tablet (40 mg total) by mouth daily., Starting 11/30/2014, Until Discontinued, Print    !! insulin aspart (NOVOLOG) 100 UNIT/ML injection Inject 20-25 Units into the skin 3 (three) times daily before meals. 20 units before breakfast; 25 units before lunch and supper., Until Discontinued, Historical Med    !! insulin aspart (NOVOLOG) 100 UNIT/ML injection Inject 3-15 Units into the skin 3 (three) times daily before meals. Per sliding scale, before meals; blood sugar 200-250 add 3 units; 251-300 add 6 units; 301-350 add 9 units; 351-400 add 12 units; over 401 add 15 units., Until Discontinued, Historical M ed    insulin glargine (LANTUS) 100 UNIT/ML injection Inject 0.7 mLs (70 Units total) into the skin at bedtime., Starting 11/30/2014, Until Discontinued, Print    lactulose (CHRONULAC) 10 GM/15ML solution Take 45 mLs (30 g total) by mouth 2 (two) times daily., Starting 11/30/2014, Until Discontinued, Print    omeprazole (PRILOSEC) 20 MG capsule Take 1 capsule (20 mg total) by mouth 2 (two) times daily before a meal., Starting 11/30/2014, Until Discontinued, Print    rifaximin (XIFAXAN) 550 MG TABS tablet Take 1 tablet (550 mg total) by mouth 2 (two) times daily., Starting 11/30/2014, Until Discontinued, Print    sertraline (ZOLOFT) 25 MG tablet  TAKE ONE TABLET BY MOUTH EVERY DAY, Normal    traMADol (ULTRAM) 50 MG tablet Take 1 tablet (50 mg total) by mouth every 8 (eight) hours as needed for moderate pain., Starting 11/30/2014, Until Discontinued, Print    glimepiride (AMARYL) 1 MG tablet TAKE ONE TABLET BY MOUTH EVERY DAY WITH BREAKFAST, Normal    methocarbamol (ROBAXIN) 500 MG tablet Take 1 tablet (500 mg total) by mouth 3 (three) times daily as needed for muscle spasms., Starting 12/10/2014, Until Discontinued, Print     !! - Potential duplicate medications found. Please discuss with provider.       DISCHARGE INSTRUCTIONS:   DIET:  Cardiac diet and Diabetic diet  DISCHARGE CONDITION:  Stable  ACTIVITY:  Activity as tolerated  OXYGEN:  Home Oxygen: No.   Oxygen Delivery: room air  DISCHARGE LOCATION:  home   If you experience worsening of your admission symptoms, develop shortness of breath, life threatening emergency, suicidal or homicidal thoughts you must seek medical attention immediately by calling 911 or calling your MD immediately  if symptoms less severe.  You Must read complete instructions/literature along with all the possible adverse reactions/side effects for all the Medicines you take and that have been prescribed to you. Take any new Medicines after you have completely understood and accpet all the  possible adverse reactions/side effects.   Please note  You were cared for by a hospitalist during your hospital stay. If you have any questions about your discharge medications or the care you received while you were in the hospital after you are discharged, you can call the unit and asked to speak with the hospitalist on call if the hospitalist that took care of you is not available. Once you are discharged, your primary care physician will handle any further medical issues. Please note that NO REFILLS for any discharge medications will be authorized once you are discharged, as it is imperative that you  return to your primary care physician (or establish a relationship with a primary care physician if you do not have one) for your aftercare needs so that they can reassess your need for medications and monitor your lab values.     Today   No acute bleeding overnight. No abdominal pain. Hemoglobin stable.  VITAL SIGNS:  Blood pressure 138/62, pulse 73, temperature 97.8 F (36.6 C), temperature source Oral, resp. rate 18, height 5\' 4"  (1.626 m), weight 82.872 kg (182 lb 11.2 oz), SpO2 100 %.  I/O:   Intake/Output Summary (Last 24 hours) at 07/21/15 1333 Last data filed at 07/21/15 0900  Gross per 24 hour  Intake    480 ml  Output      0 ml  Net    480 ml    PHYSICAL EXAMINATION:  GENERAL:  69 y.o.-year-old patient lying in the bed with no acute distress.  EYES: Pupils equal, round, reactive to light and accommodation. No scleral icterus. Extraocular muscles intact.  HEENT: Head atraumatic, normocephalic. Oropharynx and nasopharynx clear.  NECK:  Supple, no jugular venous distention. No thyroid enlargement, no tenderness.  LUNGS: Normal breath sounds bilaterally, no wheezing, rales,rhonchi. No use of accessory muscles of respiration.  CARDIOVASCULAR: S1, S2 RRR. No murmurs, rubs, or gallops.  ABDOMEN: Soft, non-tender, non-distended. Bowel sounds present. No organomegaly or mass.  EXTREMITIES: No pedal edema, cyanosis, or clubbing.  NEUROLOGIC: Cranial nerves II through XII are intact. No focal motor or sensory defecits b/l.  PSYCHIATRIC: The patient is alert and oriented x 3. Good affect.  SKIN: No obvious rash, lesion, or ulcer.   DATA REVIEW:   CBC  Recent Labs Lab 07/21/15 0504  WBC 4.5  HGB 8.3*  HCT 24.8*  PLT 53*    Chemistries   Recent Labs Lab 07/19/15 0347  NA 139  K 3.5  CL 111  CO2 22  GLUCOSE 113*  BUN 17  CREATININE 0.63  CALCIUM 8.7*  MG 1.8  AST 60*  ALT 25  ALKPHOS 121  BILITOT 1.6*    Cardiac Enzymes  Recent Labs Lab  07/19/15 0347  TROPONINI <0.03     RADIOLOGY:  No results found.    Management plans discussed with the patient, family and they are in agreement.  CODE STATUS:     Code Status Orders        Start     Ordered   07/19/15 0808  Full code   Continuous     07/19/15 0807      TOTAL TIME TAKING CARE OF THIS PATIENT: 40 minutes.    Henreitta Leber M.D on 07/21/2015 at 1:33 PM  Between 7am to 6pm - Pager - [phone]  After 6pm go to [email or web address] - password [email or web address] [facility]  [place] [facility]  Office  [phone]  CC: Primary care physician; Adrian Prows, MD

## 2015-07-21 NOTE — Discharge Instructions (Signed)

## 2015-07-21 NOTE — Progress Notes (Addendum)
Pt discharged per MD orders.  Discharge medications and instructions discussed with pt and and family.  Pt 's  IVs removed.  Pt has no questions at this time.  Clarise Cruz, RN

## 2015-07-21 NOTE — Plan of Care (Signed)
Problem: Discharge Progression Outcomes Goal: Pain controlled with appropriate interventions Outcome: Progressing Plan of care progress to goal for: 1. Pain-no c/o pain this shift 2. Hemodynamically-             -VSS, pt remains afebrile this shift 3. Complications-no evidence of this shift 4. Diet-pt tolerating diet this shift 5. Activity-pt is on bedrest, but up to [facility] with assistance

## 2015-07-23 LAB — SURGICAL PATHOLOGY

## 2015-07-26 ENCOUNTER — Telehealth: Payer: Self-pay

## 2015-07-26 NOTE — Telephone Encounter (Signed)
-----   Message from Lucilla Lame, MD sent at 07/23/2015  1:06 PM EDT ----- The patient know that the biopsy of his stomach polyp was benign.

## 2015-07-26 NOTE — Telephone Encounter (Signed)
Tried contacting pt but number has been disconnected. Mailed results letter.

## 2015-07-31 ENCOUNTER — Other Ambulatory Visit: Payer: Self-pay | Admitting: Internal Medicine

## 2015-07-31 ENCOUNTER — Telehealth: Payer: Self-pay | Admitting: *Deleted

## 2015-07-31 DIAGNOSIS — D649 Anemia, unspecified: Secondary | ICD-10-CM

## 2015-07-31 DIAGNOSIS — K2100 Gastro-esophageal reflux disease with esophagitis, without bleeding: Secondary | ICD-10-CM

## 2015-07-31 DIAGNOSIS — D509 Iron deficiency anemia, unspecified: Secondary | ICD-10-CM

## 2015-07-31 DIAGNOSIS — K7581 Nonalcoholic steatohepatitis (NASH): Secondary | ICD-10-CM

## 2015-07-31 DIAGNOSIS — K7460 Unspecified cirrhosis of liver: Secondary | ICD-10-CM

## 2015-07-31 NOTE — Telephone Encounter (Signed)
Hgb 7.3 asking if he can be transfused here tomorrow. He was last seen in October 2015 and missed his December fu appt

## 2015-07-31 NOTE — Telephone Encounter (Signed)
Pt will come in for lab tomorrow at 11 and Friday to see md and possible transfusion. Hassan Rowan at Dr Blane Ohara office will notify pt of appts

## 2015-08-01 ENCOUNTER — Inpatient Hospital Stay: Payer: Medicare Other

## 2015-08-01 DIAGNOSIS — K7581 Nonalcoholic steatohepatitis (NASH): Secondary | ICD-10-CM

## 2015-08-01 DIAGNOSIS — D649 Anemia, unspecified: Secondary | ICD-10-CM

## 2015-08-01 DIAGNOSIS — M1990 Unspecified osteoarthritis, unspecified site: Secondary | ICD-10-CM | POA: Diagnosis not present

## 2015-08-01 DIAGNOSIS — G2581 Restless legs syndrome: Secondary | ICD-10-CM | POA: Diagnosis not present

## 2015-08-01 DIAGNOSIS — K7460 Unspecified cirrhosis of liver: Secondary | ICD-10-CM

## 2015-08-01 DIAGNOSIS — I10 Essential (primary) hypertension: Secondary | ICD-10-CM | POA: Insufficient documentation

## 2015-08-01 DIAGNOSIS — G629 Polyneuropathy, unspecified: Secondary | ICD-10-CM | POA: Diagnosis not present

## 2015-08-01 DIAGNOSIS — K449 Diaphragmatic hernia without obstruction or gangrene: Secondary | ICD-10-CM | POA: Diagnosis not present

## 2015-08-01 DIAGNOSIS — G8929 Other chronic pain: Secondary | ICD-10-CM | POA: Insufficient documentation

## 2015-08-01 DIAGNOSIS — K2100 Gastro-esophageal reflux disease with esophagitis, without bleeding: Secondary | ICD-10-CM

## 2015-08-01 DIAGNOSIS — R161 Splenomegaly, not elsewhere classified: Secondary | ICD-10-CM | POA: Diagnosis not present

## 2015-08-01 DIAGNOSIS — Z79899 Other long term (current) drug therapy: Secondary | ICD-10-CM | POA: Insufficient documentation

## 2015-08-01 DIAGNOSIS — K769 Liver disease, unspecified: Secondary | ICD-10-CM | POA: Insufficient documentation

## 2015-08-01 DIAGNOSIS — F329 Major depressive disorder, single episode, unspecified: Secondary | ICD-10-CM | POA: Insufficient documentation

## 2015-08-01 DIAGNOSIS — G4730 Sleep apnea, unspecified: Secondary | ICD-10-CM | POA: Diagnosis not present

## 2015-08-01 DIAGNOSIS — Z8673 Personal history of transient ischemic attack (TIA), and cerebral infarction without residual deficits: Secondary | ICD-10-CM | POA: Diagnosis not present

## 2015-08-01 DIAGNOSIS — D509 Iron deficiency anemia, unspecified: Secondary | ICD-10-CM | POA: Insufficient documentation

## 2015-08-01 DIAGNOSIS — Z794 Long term (current) use of insulin: Secondary | ICD-10-CM | POA: Diagnosis not present

## 2015-08-01 DIAGNOSIS — E785 Hyperlipidemia, unspecified: Secondary | ICD-10-CM | POA: Insufficient documentation

## 2015-08-01 DIAGNOSIS — Z8619 Personal history of other infectious and parasitic diseases: Secondary | ICD-10-CM | POA: Diagnosis not present

## 2015-08-01 DIAGNOSIS — E119 Type 2 diabetes mellitus without complications: Secondary | ICD-10-CM | POA: Insufficient documentation

## 2015-08-01 DIAGNOSIS — R188 Other ascites: Secondary | ICD-10-CM | POA: Diagnosis not present

## 2015-08-01 DIAGNOSIS — R5383 Other fatigue: Secondary | ICD-10-CM | POA: Insufficient documentation

## 2015-08-01 DIAGNOSIS — D696 Thrombocytopenia, unspecified: Secondary | ICD-10-CM | POA: Insufficient documentation

## 2015-08-01 DIAGNOSIS — J449 Chronic obstructive pulmonary disease, unspecified: Secondary | ICD-10-CM | POA: Insufficient documentation

## 2015-08-01 DIAGNOSIS — M545 Low back pain: Secondary | ICD-10-CM | POA: Insufficient documentation

## 2015-08-01 LAB — IRON AND TIBC
Iron: 33 ug/dL — ABNORMAL LOW (ref 45–182)
Saturation Ratios: 14 % — ABNORMAL LOW (ref 17.9–39.5)
TIBC: 237 ug/dL — ABNORMAL LOW (ref 250–450)
UIBC: 204 ug/dL

## 2015-08-01 LAB — RETICULOCYTES
RBC.: 2.38 MIL/uL — AB (ref 4.40–5.90)
Retic Count, Absolute: 54.7 10*3/uL (ref 19.0–183.0)
Retic Ct Pct: 2.3 % (ref 0.4–3.1)

## 2015-08-01 LAB — CBC WITH DIFFERENTIAL/PLATELET
BASOS PCT: 1 %
Basophils Absolute: 0 10*3/uL (ref 0–0.1)
Eosinophils Absolute: 0.2 10*3/uL (ref 0–0.7)
Eosinophils Relative: 6 %
HEMATOCRIT: 22.7 % — AB (ref 40.0–52.0)
Hemoglobin: 7.4 g/dL — ABNORMAL LOW (ref 13.0–18.0)
Lymphocytes Relative: 12 %
Lymphs Abs: 0.4 10*3/uL — ABNORMAL LOW (ref 1.0–3.6)
MCH: 31.1 pg (ref 26.0–34.0)
MCHC: 32.7 g/dL (ref 32.0–36.0)
MCV: 95.1 fL (ref 80.0–100.0)
MONO ABS: 0.2 10*3/uL (ref 0.2–1.0)
Monocytes Relative: 7 %
Neutro Abs: 2.3 10*3/uL (ref 1.4–6.5)
Neutrophils Relative %: 74 %
Platelets: 51 10*3/uL — ABNORMAL LOW (ref 150–440)
RBC: 2.38 MIL/uL — AB (ref 4.40–5.90)
RDW: 18.8 % — AB (ref 11.5–14.5)
WBC: 3.1 10*3/uL — AB (ref 3.8–10.6)

## 2015-08-01 LAB — FERRITIN: Ferritin: 36 ng/mL (ref 24–336)

## 2015-08-01 LAB — SAMPLE TO BLOOD BANK

## 2015-08-01 LAB — FOLATE: Folate: 12.8 ng/mL (ref >5.9)

## 2015-08-01 LAB — DAT, POLYSPECIFIC AHG (ARMC ONLY): Polyspecific AHG test: NEGATIVE

## 2015-08-01 LAB — VITAMIN B12: Vitamin B-12: 538 pg/mL (ref 180–914)

## 2015-08-02 ENCOUNTER — Inpatient Hospital Stay: Payer: Medicare Other | Attending: Internal Medicine | Admitting: Internal Medicine

## 2015-08-02 ENCOUNTER — Encounter: Payer: Self-pay | Admitting: Internal Medicine

## 2015-08-02 ENCOUNTER — Inpatient Hospital Stay: Payer: Medicare Other

## 2015-08-02 VITALS — BP 154/75 | HR 84 | Temp 97.0°F | Resp 20

## 2015-08-02 VITALS — BP 131/70 | HR 87 | Temp 98.4°F | Resp 20 | Ht 64.0 in | Wt 176.8 lb

## 2015-08-02 DIAGNOSIS — G2581 Restless legs syndrome: Secondary | ICD-10-CM

## 2015-08-02 DIAGNOSIS — K7460 Unspecified cirrhosis of liver: Secondary | ICD-10-CM

## 2015-08-02 DIAGNOSIS — K449 Diaphragmatic hernia without obstruction or gangrene: Secondary | ICD-10-CM

## 2015-08-02 DIAGNOSIS — R161 Splenomegaly, not elsewhere classified: Secondary | ICD-10-CM

## 2015-08-02 DIAGNOSIS — D649 Anemia, unspecified: Secondary | ICD-10-CM

## 2015-08-02 DIAGNOSIS — R5383 Other fatigue: Secondary | ICD-10-CM

## 2015-08-02 DIAGNOSIS — D696 Thrombocytopenia, unspecified: Secondary | ICD-10-CM | POA: Diagnosis not present

## 2015-08-02 DIAGNOSIS — Z79899 Other long term (current) drug therapy: Secondary | ICD-10-CM

## 2015-08-02 DIAGNOSIS — M1990 Unspecified osteoarthritis, unspecified site: Secondary | ICD-10-CM

## 2015-08-02 DIAGNOSIS — Z8619 Personal history of other infectious and parasitic diseases: Secondary | ICD-10-CM

## 2015-08-02 DIAGNOSIS — J449 Chronic obstructive pulmonary disease, unspecified: Secondary | ICD-10-CM

## 2015-08-02 DIAGNOSIS — K769 Liver disease, unspecified: Secondary | ICD-10-CM

## 2015-08-02 DIAGNOSIS — G629 Polyneuropathy, unspecified: Secondary | ICD-10-CM

## 2015-08-02 DIAGNOSIS — E119 Type 2 diabetes mellitus without complications: Secondary | ICD-10-CM

## 2015-08-02 DIAGNOSIS — E785 Hyperlipidemia, unspecified: Secondary | ICD-10-CM

## 2015-08-02 DIAGNOSIS — Z8673 Personal history of transient ischemic attack (TIA), and cerebral infarction without residual deficits: Secondary | ICD-10-CM

## 2015-08-02 DIAGNOSIS — I10 Essential (primary) hypertension: Secondary | ICD-10-CM

## 2015-08-02 DIAGNOSIS — F329 Major depressive disorder, single episode, unspecified: Secondary | ICD-10-CM

## 2015-08-02 DIAGNOSIS — Z794 Long term (current) use of insulin: Secondary | ICD-10-CM

## 2015-08-02 DIAGNOSIS — R188 Other ascites: Secondary | ICD-10-CM

## 2015-08-02 DIAGNOSIS — D509 Iron deficiency anemia, unspecified: Secondary | ICD-10-CM

## 2015-08-02 DIAGNOSIS — M545 Low back pain: Secondary | ICD-10-CM

## 2015-08-02 DIAGNOSIS — G8929 Other chronic pain: Secondary | ICD-10-CM

## 2015-08-02 DIAGNOSIS — G4730 Sleep apnea, unspecified: Secondary | ICD-10-CM

## 2015-08-02 LAB — PROTEIN ELECTROPHORESIS, SERUM
A/G RATIO SPE: 0.8 (ref 0.7–1.7)
ALBUMIN ELP: 3 g/dL (ref 2.9–4.4)
Alpha-1-Globulin: 0.3 g/dL (ref 0.0–0.4)
Alpha-2-Globulin: 0.5 g/dL (ref 0.4–1.0)
BETA GLOBULIN: 1.3 g/dL (ref 0.7–1.3)
GLOBULIN, TOTAL: 4 g/dL — AB (ref 2.2–3.9)
Gamma Globulin: 1.9 g/dL — ABNORMAL HIGH (ref 0.4–1.8)
TOTAL PROTEIN ELP: 7 g/dL (ref 6.0–8.5)

## 2015-08-02 LAB — PREPARE RBC (CROSSMATCH)

## 2015-08-02 MED ORDER — SODIUM CHLORIDE 0.9 % IV SOLN
INTRAVENOUS | Status: DC
  Administered 2015-08-02: 12:00:00 via INTRAVENOUS
  Filled 2015-08-02: qty 1000

## 2015-08-02 MED ORDER — ACETAMINOPHEN 325 MG PO TABS
650.0000 mg | ORAL_TABLET | Freq: Once | ORAL | Status: AC
  Administered 2015-08-02: 650 mg via ORAL
  Filled 2015-08-02: qty 2

## 2015-08-02 MED ORDER — DIPHENHYDRAMINE HCL 25 MG PO CAPS
25.0000 mg | ORAL_CAPSULE | Freq: Once | ORAL | Status: AC
  Administered 2015-08-02: 25 mg via ORAL
  Filled 2015-08-02: qty 1

## 2015-08-02 NOTE — Progress Notes (Signed)
Pt recently got out of hosp. For GI bleeding. Feels tired and weak still.

## 2015-08-04 LAB — TYPE AND SCREEN
ABO/RH(D): AB POS
Antibody Screen: NEGATIVE
Unit division: 0

## 2015-08-07 ENCOUNTER — Inpatient Hospital Stay: Payer: Medicare Other

## 2015-08-07 VITALS — BP 150/68 | HR 90 | Temp 97.2°F | Resp 18

## 2015-08-07 DIAGNOSIS — D509 Iron deficiency anemia, unspecified: Secondary | ICD-10-CM | POA: Diagnosis not present

## 2015-08-07 MED ORDER — SODIUM CHLORIDE 0.9 % IV SOLN
Freq: Once | INTRAVENOUS | Status: AC
Start: 2015-08-07 — End: 2015-08-07
  Administered 2015-08-07: 10:00:00 via INTRAVENOUS
  Filled 2015-08-07: qty 1000

## 2015-08-07 MED ORDER — SODIUM CHLORIDE 0.9 % IV SOLN
500.0000 mg | Freq: Once | INTRAVENOUS | Status: AC
  Administered 2015-08-07: 500 mg via INTRAVENOUS
  Filled 2015-08-07: qty 25

## 2015-08-07 NOTE — Progress Notes (Signed)
   08/07/15 0900  Clinical Encounter Type  Visited With Patient and family together  Visit Type Initial  Provided pastoral support and compassion to patient and his wife in the cancer center.  [place] [phone]

## 2015-08-08 ENCOUNTER — Inpatient Hospital Stay (HOSPITAL_COMMUNITY)
Admission: AD | Admit: 2015-08-08 | Discharge: 2015-08-12 | DRG: 377 | Disposition: A | Discharge status: Home Health | Payer: Medicare Other | Source: Other Acute Inpatient Hospital | Attending: Internal Medicine | Admitting: Internal Medicine

## 2015-08-08 ENCOUNTER — Emergency Department
Admission: EM | Admit: 2015-08-08 | Discharge: 2015-08-08 | Disposition: A | Discharge status: Other Acute Inpatient Hospital | Payer: Medicare Other | Attending: Internal Medicine | Admitting: Internal Medicine

## 2015-08-08 ENCOUNTER — Encounter: Payer: Self-pay | Admitting: Emergency Medicine

## 2015-08-08 ENCOUNTER — Emergency Department: Payer: Medicare Other

## 2015-08-08 DIAGNOSIS — G4733 Obstructive sleep apnea (adult) (pediatric): Secondary | ICD-10-CM | POA: Diagnosis not present

## 2015-08-08 DIAGNOSIS — J449 Chronic obstructive pulmonary disease, unspecified: Secondary | ICD-10-CM | POA: Diagnosis present

## 2015-08-08 DIAGNOSIS — G2581 Restless legs syndrome: Secondary | ICD-10-CM | POA: Diagnosis present

## 2015-08-08 DIAGNOSIS — M109 Gout, unspecified: Secondary | ICD-10-CM | POA: Diagnosis not present

## 2015-08-08 DIAGNOSIS — K625 Hemorrhage of anus and rectum: Secondary | ICD-10-CM

## 2015-08-08 DIAGNOSIS — G8929 Other chronic pain: Secondary | ICD-10-CM | POA: Diagnosis not present

## 2015-08-08 DIAGNOSIS — S0101XA Laceration without foreign body of scalp, initial encounter: Secondary | ICD-10-CM | POA: Diagnosis present

## 2015-08-08 DIAGNOSIS — E1165 Type 2 diabetes mellitus with hyperglycemia: Secondary | ICD-10-CM | POA: Diagnosis not present

## 2015-08-08 DIAGNOSIS — E11319 Type 2 diabetes mellitus with unspecified diabetic retinopathy without macular edema: Secondary | ICD-10-CM | POA: Diagnosis present

## 2015-08-08 DIAGNOSIS — Y92238 Other place in hospital as the place of occurrence of the external cause: Secondary | ICD-10-CM

## 2015-08-08 DIAGNOSIS — I10 Essential (primary) hypertension: Secondary | ICD-10-CM | POA: Diagnosis not present

## 2015-08-08 DIAGNOSIS — H548 Legal blindness, as defined in USA: Secondary | ICD-10-CM | POA: Diagnosis present

## 2015-08-08 DIAGNOSIS — K7581 Nonalcoholic steatohepatitis (NASH): Secondary | ICD-10-CM | POA: Diagnosis present

## 2015-08-08 DIAGNOSIS — Z8673 Personal history of transient ischemic attack (TIA), and cerebral infarction without residual deficits: Secondary | ICD-10-CM

## 2015-08-08 DIAGNOSIS — Z8709 Personal history of other diseases of the respiratory system: Secondary | ICD-10-CM

## 2015-08-08 DIAGNOSIS — K7460 Unspecified cirrhosis of liver: Secondary | ICD-10-CM | POA: Diagnosis present

## 2015-08-08 DIAGNOSIS — K2971 Gastritis, unspecified, with bleeding: Secondary | ICD-10-CM | POA: Diagnosis not present

## 2015-08-08 DIAGNOSIS — D61818 Other pancytopenia: Secondary | ICD-10-CM | POA: Diagnosis present

## 2015-08-08 DIAGNOSIS — K922 Gastrointestinal hemorrhage, unspecified: Principal | ICD-10-CM | POA: Diagnosis present

## 2015-08-08 DIAGNOSIS — D509 Iron deficiency anemia, unspecified: Secondary | ICD-10-CM | POA: Diagnosis present

## 2015-08-08 DIAGNOSIS — E785 Hyperlipidemia, unspecified: Secondary | ICD-10-CM | POA: Diagnosis present

## 2015-08-08 DIAGNOSIS — Z79899 Other long term (current) drug therapy: Secondary | ICD-10-CM | POA: Insufficient documentation

## 2015-08-08 DIAGNOSIS — K2970 Gastritis, unspecified, without bleeding: Secondary | ICD-10-CM | POA: Insufficient documentation

## 2015-08-08 DIAGNOSIS — K7689 Other specified diseases of liver: Secondary | ICD-10-CM | POA: Diagnosis not present

## 2015-08-08 DIAGNOSIS — W19XXXA Unspecified fall, initial encounter: Secondary | ICD-10-CM | POA: Diagnosis present

## 2015-08-08 DIAGNOSIS — S065XAA Traumatic subdural hemorrhage with loss of consciousness status unknown, initial encounter: Secondary | ICD-10-CM

## 2015-08-08 DIAGNOSIS — D689 Coagulation defect, unspecified: Secondary | ICD-10-CM | POA: Diagnosis not present

## 2015-08-08 DIAGNOSIS — K759 Inflammatory liver disease, unspecified: Secondary | ICD-10-CM | POA: Diagnosis present

## 2015-08-08 DIAGNOSIS — S065X0A Traumatic subdural hemorrhage without loss of consciousness, initial encounter: Secondary | ICD-10-CM | POA: Diagnosis present

## 2015-08-08 DIAGNOSIS — F329 Major depressive disorder, single episode, unspecified: Secondary | ICD-10-CM | POA: Diagnosis present

## 2015-08-08 DIAGNOSIS — D62 Acute posthemorrhagic anemia: Secondary | ICD-10-CM | POA: Diagnosis not present

## 2015-08-08 DIAGNOSIS — F32A Depression, unspecified: Secondary | ICD-10-CM | POA: Diagnosis present

## 2015-08-08 DIAGNOSIS — M4802 Spinal stenosis, cervical region: Secondary | ICD-10-CM | POA: Diagnosis not present

## 2015-08-08 DIAGNOSIS — IMO0002 Reserved for concepts with insufficient information to code with codable children: Secondary | ICD-10-CM | POA: Diagnosis present

## 2015-08-08 DIAGNOSIS — S065X9A Traumatic subdural hemorrhage with loss of consciousness of unspecified duration, initial encounter: Secondary | ICD-10-CM | POA: Diagnosis present

## 2015-08-08 DIAGNOSIS — I639 Cerebral infarction, unspecified: Secondary | ICD-10-CM | POA: Diagnosis present

## 2015-08-08 DIAGNOSIS — R4189 Other symptoms and signs involving cognitive functions and awareness: Secondary | ICD-10-CM | POA: Diagnosis present

## 2015-08-08 DIAGNOSIS — M542 Cervicalgia: Secondary | ICD-10-CM

## 2015-08-08 LAB — CBC
HEMATOCRIT: 20.5 % — AB (ref 40.0–52.0)
HEMOGLOBIN: 6.8 g/dL — AB (ref 13.0–18.0)
MCH: 31.4 pg (ref 26.0–34.0)
MCHC: 33.3 g/dL (ref 32.0–36.0)
MCV: 94.3 fL (ref 80.0–100.0)
Platelets: 52 10*3/uL — ABNORMAL LOW (ref 150–440)
RBC: 2.18 MIL/uL — ABNORMAL LOW (ref 4.40–5.90)
RDW: 19.5 % — ABNORMAL HIGH (ref 11.5–14.5)
WBC: 3.3 10*3/uL — AB (ref 3.8–10.6)

## 2015-08-08 LAB — COMPREHENSIVE METABOLIC PANEL
ALT: 27 U/L (ref 17–63)
AST: 73 U/L — ABNORMAL HIGH (ref 15–41)
Albumin: 3.1 g/dL — ABNORMAL LOW (ref 3.5–5.0)
Alkaline Phosphatase: 114 U/L (ref 38–126)
Anion gap: 3 — ABNORMAL LOW (ref 5–15)
BUN: 11 mg/dL (ref 6–20)
CO2: 25 mmol/L (ref 22–32)
Calcium: 8.7 mg/dL — ABNORMAL LOW (ref 8.9–10.3)
Chloride: 110 mmol/L (ref 101–111)
Creatinine, Ser: 0.63 mg/dL (ref 0.61–1.24)
GFR calc Af Amer: >60 mL/min (ref >60)
GFR calc non Af Amer: >60 mL/min (ref >60)
Glucose, Bld: 65 mg/dL (ref 65–99)
Potassium: 3.8 mmol/L (ref 3.5–5.1)
Sodium: 138 mmol/L (ref 135–145)
Total Bilirubin: 3.7 mg/dL — ABNORMAL HIGH (ref 0.3–1.2)
Total Protein: 7.3 g/dL (ref 6.5–8.1)

## 2015-08-08 LAB — MRSA PCR SCREENING: MRSA BY PCR: NEGATIVE

## 2015-08-08 LAB — PREPARE RBC (CROSSMATCH)

## 2015-08-08 LAB — GLUCOSE, CAPILLARY: Glucose-Capillary: 81 mg/dL (ref 65–99)

## 2015-08-08 MED ORDER — INSULIN ASPART 100 UNIT/ML ~~LOC~~ SOLN: 0.0000 [IU] | Freq: Three times a day (TID) | SUBCUTANEOUS | Status: DC

## 2015-08-08 MED ORDER — ACETAMINOPHEN 325 MG PO TABS: 650.0000 mg | ORAL_TABLET | Freq: Four times a day (QID) | ORAL | Status: DC | PRN

## 2015-08-08 MED ORDER — BRIMONIDINE TARTRATE-TIMOLOL 0.2-0.5 % OP SOLN: 1.0000 [drp] | Freq: Two times a day (BID) | OPHTHALMIC | Status: DC

## 2015-08-08 MED ORDER — SODIUM CHLORIDE 0.9 % IV SOLN: 8.0000 mg/h | INTRAVENOUS | Status: DC

## 2015-08-08 MED ORDER — RIFAXIMIN 550 MG PO TABS: 550.0000 mg | ORAL_TABLET | Freq: Two times a day (BID) | ORAL | Status: DC

## 2015-08-08 MED ORDER — SODIUM CHLORIDE 0.9 % IV SOLN: 80.0000 mg | Freq: Once | INTRAVENOUS | Status: DC

## 2015-08-08 MED ORDER — ALLOPURINOL 100 MG PO TABS: 100.0000 mg | ORAL_TABLET | Freq: Two times a day (BID) | ORAL | Status: DC

## 2015-08-08 MED ORDER — SODIUM CHLORIDE 0.9 % IV SOLN: 10.0000 mL/h | Freq: Once | INTRAVENOUS | Status: DC

## 2015-08-08 MED ORDER — ACETAMINOPHEN 325 MG PO TABS: 650.0000 mg | ORAL_TABLET | ORAL | Status: DC | PRN

## 2015-08-08 MED ORDER — ACETAMINOPHEN 650 MG RE SUPP: 650.0000 mg | Freq: Four times a day (QID) | RECTAL | Status: DC | PRN

## 2015-08-08 MED ORDER — VITAMIN D-3 25 MCG (1000 UT) PO CAPS
1000.0000 [IU] | ORAL_CAPSULE | Freq: Every day | ORAL | Status: DC
Start: 2015-08-08 — End: 2015-08-08

## 2015-08-08 MED ORDER — SERTRALINE HCL 50 MG PO TABS: 25.0000 mg | ORAL_TABLET | Freq: Every day | ORAL | Status: DC

## 2015-08-08 MED ORDER — CARBOXYMETHYLCELLUL-GLYCERIN 1-0.9 % OP GEL: 1.0000 [drp] | Freq: Four times a day (QID) | OPHTHALMIC | Status: DC

## 2015-08-08 MED ORDER — SODIUM CHLORIDE 0.9 % IV SOLN
10.0000 mL/h | Freq: Once | INTRAVENOUS | Status: AC
  Administered 2015-08-08: 10 mL/h via INTRAVENOUS

## 2015-08-08 MED ORDER — TRAMADOL HCL 50 MG PO TABS: 50.0000 mg | ORAL_TABLET | Freq: Three times a day (TID) | ORAL | Status: DC | PRN

## 2015-08-08 MED ORDER — PANTOPRAZOLE SODIUM 40 MG IV SOLR: 40.0000 mg | Freq: Two times a day (BID) | INTRAVENOUS | Status: DC

## 2015-08-08 MED ORDER — FUROSEMIDE 40 MG PO TABS: 40.0000 mg | ORAL_TABLET | Freq: Every day | ORAL | Status: DC

## 2015-08-08 MED ORDER — COLCHICINE 0.6 MG PO TABS: 0.6000 mg | ORAL_TABLET | Freq: Every day | ORAL | Status: DC | PRN

## 2015-08-08 MED ORDER — CHOLESTYRAMINE 4 G PO PACK: 4.0000 g | PACK | Freq: Three times a day (TID) | ORAL | Status: DC

## 2015-08-08 MED ORDER — SODIUM CHLORIDE 0.9 % IV SOLN: INTRAVENOUS | Status: DC

## 2015-08-08 MED ORDER — LIDOCAINE-EPINEPHRINE (PF) 1 %-1:200000 IJ SOLN
INTRAMUSCULAR | Status: AC
  Filled 2015-08-08: qty 30

## 2015-08-08 MED ORDER — IPRATROPIUM-ALBUTEROL 0.5-2.5 (3) MG/3ML IN SOLN: 3.0000 mL | RESPIRATORY_TRACT | Status: DC

## 2015-08-08 NOTE — H&P (Addendum)
[facility] NAME: [name]    MR#:  [id number]  DATE OF BIRTH:  1946/07/26  DATE OF ADMISSION:  08/08/2015  PRIMARY CARE PHYSICIAN: Adrian Prows, MD   REQUESTING/REFERRING PHYSICIAN: DR. Lavonia Drafts  CHIEF COMPLAINT:   Chief Complaint  Patient presents with  . Rectal Bleeding    HISTORY OF PRESENT ILLNESS: [name]  is a 69 y.o. male with a known history of CVA, hypertension, COPD, diabetes, liver cirrhosis, obstructive sleep apnea and recent hospitalization for GI bleed who underwent endoscopy which did not show any active source of bleeding. Patient had a nurse visit him at the house and she notice his stools were very dark color in nature. Patient actually went to the cancer center yesterday and was transfused blood.Marland Kitchen He comes to the ER with blood in his stool and was noted to have a very low hemoglobin. Of 6.8. Patient otherwise denies any chest pain shortness of breath or abdominal pain no nausea vomiting or diarrhea.  PAST MEDICAL HISTORY:   Past Medical History  Diagnosis Date  . Stroke   . Hypertension   . COPD (chronic obstructive pulmonary disease)   . Diabetes mellitus without complication   . H/O hiatal hernia   . Depression   . Arthritis   . Hepatitis     NASH  . Cirrhosis of liver not due to alcohol   . Obstructive sleep apnea 11/20/2014  . Hyperlipidemia   . Hiatal hernia   . GI bleeds, multiple during admission   . Restless leg syndrome     PAST SURGICAL HISTORY:  Past Surgical History  Procedure Laterality Date  . Nose surgery    . Appendectomy    . Cardiac catheterization    . Eye surgery      leaglly blind  . Anterior cervical decompression/discectomy fusion 4 levels N/A 11/13/2014    Procedure:  Cervical three-four, Cervical four-five, Cervical five-six, Cervical six- seven anterior cervical decompression with fusion and interbody prosthesis plating and bonegraft;   Surgeon: Consuella Lose, MD;  Location: [facility];  Service: Neurosurgery;  Laterality: N/A;    SOCIAL HISTORY:  Social History  Substance Use Topics  . Smoking status: Never Smoker   . Smokeless tobacco: Never Used  . Alcohol Use: No    FAMILY HISTORY:  Family History  Problem Relation Age of Onset  . Kidney failure Sister     CKD  . Diabetes Mellitus II Sister     DRUG ALLERGIES:  Allergies  Allergen Reactions  . Actos [Pioglitazone] Anaphylaxis  . Aminophylline Other (See Comments)    Reaction: Unknown  . Amitriptyline Other (See Comments)    Reaction: Unknown   . Ampicillin Hives  . Hctz [Hydrochlorothiazide] Other (See Comments)    Reaction: Unknown   . Naproxen Other (See Comments)    Reaction: Unknown   . Penicillins Rash    REVIEW OF SYSTEMS:   CONSTITUTIONAL: No fever, positive fatigue and weakness.  EYES: No blurred or double vision.  EARS, NOSE, AND THROAT: No tinnitus or ear pain.  RESPIRATORY: No cough, shortness of breath, wheezing or hemoptysis.  CARDIOVASCULAR: No chest pain, orthopnea, edema.  GASTROINTESTINAL: No nausea, vomiting, diarrhea or abdominal pain. Dark color stools GENITOURINARY: No dysuria, hematuria.  ENDOCRINE: No polyuria, nocturia,  HEMATOLOGY: No anemia, easy bruising or bleeding SKIN: No rash or lesion. MUSCULOSKELETAL: No joint pain or arthritis.   NEUROLOGIC: No tingling, numbness, weakness.  PSYCHIATRY: No  anxiety or depression.   MEDICATIONS AT HOME:  Prior to Admission medications   Medication Sig Start Date End Date Taking? Authorizing Provider  allopurinol (ZYLOPRIM) 100 MG tablet Take 1 tablet (100 mg total) by mouth 2 (two) times daily. 11/30/14  Yes Daniel J Angiulli, PA-C  cholestyramine (QUESTRAN) 4 G packet Take 1 packet (4 g total) by mouth 3 (three) times daily. 11/30/14  Yes Daniel J Angiulli, PA-C  colchicine 0.6 MG tablet Take 0.6 mg by mouth daily as needed (for gout flares).   Yes Historical  Provider, MD  acetaminophen (TYLENOL) 325 MG tablet Take 650 mg by mouth every 4 (four) hours as needed (Pain). 1-2 tablets every 4-6 hours as needed for pain.    Historical Provider, MD  brimonidine-timolol (COMBIGAN) 0.2-0.5 % ophthalmic solution Place 1 drop into the left eye every 12 (twelve) hours.    Historical Provider, MD  Cholecalciferol (VITAMIN D-3) 1000 UNITS CAPS Take 1 capsule (1,000 Units total) by mouth daily. 11/30/14   Lavon Paganini Angiulli, PA-C  ferrous sulfate 325 (65 FE) MG tablet Take 1 tablet (325 mg total) by mouth 3 (three) times daily with meals. 11/30/14   Lavon Paganini Angiulli, PA-C  furosemide (LASIX) 40 MG tablet Take 1 tablet (40 mg total) by mouth daily. 11/30/14   Lavon Paganini Angiulli, PA-C  glimepiride (AMARYL) 1 MG tablet TAKE ONE TABLET BY MOUTH EVERY DAY WITH BREAKFAST 01/22/15   Meredith Staggers, MD  insulin aspart (NOVOLOG) 100 UNIT/ML injection Inject 20-25 Units into the skin 3 (three) times daily before meals. 20 units before breakfast; 25 units before lunch and supper.    Historical Provider, MD  insulin aspart (NOVOLOG) 100 UNIT/ML injection Inject 3-15 Units into the skin 3 (three) times daily before meals. Per sliding scale, before meals; blood sugar 200-250 add 3 units; 251-300 add 6 units; 301-350 add 9 units; 351-400 add 12 units; over 401 add 15 units.    Historical Provider, MD  insulin glargine (LANTUS) 100 UNIT/ML injection Inject 0.7 mLs (70 Units total) into the skin at bedtime. Patient taking differently: Inject 80 Units into the skin at bedtime.  11/30/14   Lavon Paganini Angiulli, PA-C  lactulose (CHRONULAC) 10 GM/15ML solution Take 45 mLs (30 g total) by mouth 2 (two) times daily. Patient taking differently: Take 20 g by mouth 2 (two) times daily.  11/30/14   Lavon Paganini Angiulli, PA-C  methocarbamol (ROBAXIN) 500 MG tablet Take 1 tablet (500 mg total) by mouth 3 (three) times daily as needed for muscle spasms. 12/10/14   Meredith Staggers, MD  omeprazole (PRILOSEC)  20 MG capsule Take 1 capsule (20 mg total) by mouth 2 (two) times daily before a meal. 11/30/14   Lavon Paganini Angiulli, PA-C  rifaximin (XIFAXAN) 550 MG TABS tablet Take 1 tablet (550 mg total) by mouth 2 (two) times daily. 11/30/14   Lavon Paganini Angiulli, PA-C  sertraline (ZOLOFT) 25 MG tablet TAKE ONE TABLET BY MOUTH EVERY DAY 01/22/15   Meredith Staggers, MD  traMADol (ULTRAM) 50 MG tablet Take 1 tablet (50 mg total) by mouth every 8 (eight) hours as needed for moderate pain. 11/30/14   Lavon Paganini Angiulli, PA-C      PHYSICAL EXAMINATION:   VITAL SIGNS: Blood pressure 141/68, pulse 86, temperature 98.3 F (36.8 C), temperature source Oral, resp. rate 16, height 5\' 4"  (1.626 m), weight 78.926 kg (174 lb), SpO2 100 %.  GENERAL:  69 y.o.-year-old patient lying in the bed with no  acute distress.  EYES: Pupils equal, round, reactive to light and accommodation. No scleral icterus. Extraocular muscles intact. There is conjunctival pallor HEENT: Head atraumatic, normocephalic. Oropharynx and nasopharynx clear.  NECK:  Supple, no jugular venous distention. No thyroid enlargement, no tenderness.  LUNGS: Normal breath sounds bilaterally, no wheezing, rales,rhonchi or crepitation. No use of accessory muscles of respiration.  CARDIOVASCULAR: S1, S2 normal. No murmurs, rubs, or gallops.  ABDOMEN: Soft, nontender, nondistended. Bowel sounds present. No organomegaly or mass.  EXTREMITIES: 1+ pedal edema NEUROLOGIC: Cranial nerves II through XII are intact. Muscle strength 5/5 in all extremities. Sensation intact. Gait not checked.  PSYCHIATRIC: The patient is alert and oriented x 3.  SKIN: No obvious rash, lesion, or ulcer.   LABORATORY PANEL:   CBC  Recent Labs Lab 08/08/15 1040  WBC 3.3*  HGB 6.8*  HCT 20.5*  PLT 52*  MCV 94.3  MCH 31.4  MCHC 33.3  RDW 19.5*   ------------------------------------------------------------------------------------------------------------------  Chemistries   Recent  Labs Lab 08/08/15 1040  NA 138  K 3.8  CL 110  CO2 25  GLUCOSE 65  BUN 11  CREATININE 0.63  CALCIUM 8.7*  AST 73*  ALT 27  ALKPHOS 114  BILITOT 3.7*   ------------------------------------------------------------------------------------------------------------------ estimated creatinine clearance is 83.9 mL/min (by C-G formula based on Cr of 0.63). ------------------------------------------------------------------------------------------------------------------ No results for input(s): TSH, T4TOTAL, T3FREE, THYROIDAB in the last 72 hours.  Invalid input(s): FREET3   Coagulation profile No results for input(s): INR, PROTIME in the last 168 hours. ------------------------------------------------------------------------------------------------------------------- No results for input(s): DDIMER in the last 72 hours. -------------------------------------------------------------------------------------------------------------------  Cardiac Enzymes No results for input(s): CKMB, TROPONINI, MYOGLOBIN in the last 168 hours.  Invalid input(s): CK ------------------------------------------------------------------------------------------------------------------ Invalid input(s): POCBNP  ---------------------------------------------------------------------------------------------------------------  Urinalysis    Component Value Date/Time   COLORURINE YELLOW* 07/19/2015 0505   COLORURINE Straw 12/25/2014 0128   APPEARANCEUR CLEAR* 07/19/2015 0505   APPEARANCEUR Clear 12/25/2014 0128   LABSPEC 1.014 07/19/2015 0505   LABSPEC 1.017 12/25/2014 0128   PHURINE 5.0 07/19/2015 0505   PHURINE 7.0 12/25/2014 0128   GLUCOSEU NEGATIVE 07/19/2015 0505   GLUCOSEU >=500 12/25/2014 0128   HGBUR NEGATIVE 07/19/2015 0505   HGBUR Negative 12/25/2014 0128   BILIRUBINUR NEGATIVE 07/19/2015 0505   BILIRUBINUR Negative 12/25/2014 0128   KETONESUR NEGATIVE 07/19/2015 0505   KETONESUR Negative  12/25/2014 0128   PROTEINUR NEGATIVE 07/19/2015 0505   PROTEINUR Negative 12/25/2014 0128   UROBILINOGEN 0.2 11/21/2014 0105   NITRITE NEGATIVE 07/19/2015 0505   NITRITE Negative 12/25/2014 0128   LEUKOCYTESUR NEGATIVE 07/19/2015 0505   LEUKOCYTESUR Negative 12/25/2014 0128     RADIOLOGY: No results found.  EKG: Orders placed or performed during the hospital encounter of 07/19/15  . ED EKG  . ED EKG  . EKG 12-Lead  . EKG 12-Lead  . EKG    IMPRESSION AND PLAN: Patient is a 69 year old white male with history of non-alcoholic liver cirrhosis presents with dark color stools 1. Acute GI bleed: Suspected upper in nature again, patient will be transfused 2 units of packed RBCs I have spoken to the on-call GI doctor Dr. Vira Agar. He will be placed on a Protonix drip hemoglobins followed closely transfused as needed   2. Diabetes type 2 place him on sliding scale insulin hold his oral regimen and lantus   3. Pancytopenia due to liver cirrhosis  4. COPD when necessary nebs  5. Depression continue sertraline  6. Miscellaneous SCDs for DVT prophylaxis     All the records  are reviewed and case discussed with ED provider. Management plans discussed with the patient, family and they are in agreement.  CODE STATUS:    Code Status Orders        Start     Ordered   08/08/15 1455  Full code   Continuous     08/08/15 1455       TOTAL TIME TAKING CARE OF THIS PATIENT: 55 minutes.    Dustin Flock M.D on 08/08/2015 at 3:02 PM  Between 7am to 6pm - Pager - [phone]  After 6pm go to [email or web address] - password [email or web address] [facility]  [facility]  Office  [phone]  CC: Primary care physician; Adrian Prows, MD

## 2015-08-08 NOTE — Consult Note (Signed)
Consultation  Referring Provider: Dr. Dustin Flock Primary Care Physician:  Adrian Prows, MD Consulting  Gastroenterologist:   Dr. Gaylyn Cheers    Reason for Consultation:  Acute GI bleed           HPI:   Luke NOTEBOOM Sr. is a 69 y.o. male with a known history of CVA, hypertension, COPD, diabetes, liver cirrhosis, obstructive sleep apnea, IDA and recent hospitalization for GI bleed who underwent an upper  endoscopy 07/20/2015 that was normal.   He reports recent anemia- maybe with darker stools . He had a drop in his Hgb from 10.8 to 7.3 9 days ago and received 2 units PRBC at the outpatient hematology clinic. Today, he felt fine, ate breakfast and had a bowel movement times 3 that was red and watery  per his wife. He cannot see well. His wife reports via sign language to their daughter that this was more fresh blood. He had a smaller red bowel movement in the ER. He fell forward while on the commode in the ER and lacerated his right forehead and has received 5 sutures. He has a sore right knee. A head CT has been ordered. He denies any dizziness, lightheadedness, CP or SOB.  He is smiling and says he feels well.   His chart was reviewed and he has a hx of chronic GI blood loss. He had his most recent colonoscopy  07/01/2012 for hem positive stool and anemia which showed 5 small polyps bd medium sized IH. He had an EGD 07/01/2012 that showed gastritis. He had a normal capsule endoscopy in 03/28/2013. 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     Past Medical History  Diagnosis Date  . Stroke   . Hypertension   . COPD (chronic obstructive pulmonary disease)   . Diabetes mellitus without complication   . H/O hiatal hernia   . Depression   . Arthritis   . Hepatitis     NASH  . Cirrhosis of liver not due to alcohol   . Obstructive sleep apnea 11/20/2014  . Hyperlipidemia   . Hiatal hernia   . GI bleeds, multiple during admission   . Restless leg syndrome     Past Surgical  History  Procedure Laterality Date  . Nose surgery    . Appendectomy    . Cardiac catheterization    . Eye surgery      leaglly blind  . Anterior cervical decompression/discectomy fusion 4 levels N/A 11/13/2014    Procedure:  Cervical three-four, Cervical four-five, Cervical five-six, Cervical six- seven anterior cervical decompression with fusion and interbody prosthesis plating and bonegraft;  Surgeon: Consuella Lose, MD;  Location: [facility];  Service: Neurosurgery;  Laterality: N/A;    Family History  Problem Relation Age of Onset  . Kidney failure Sister     CKD  . Diabetes Mellitus II Sister      Social History  Substance Use Topics  . Smoking status: Never Smoker   . Smokeless tobacco: Never Used  . Alcohol Use: No    Prior to Admission medications   Medication Sig Start Date End Date Taking? Authorizing Provider  acetaminophen (TYLENOL) 325 MG tablet Take 650 mg by mouth every 4 (four) hours as needed for mild pain. 1-2 tablets every 4-6 hours as needed for pain.   Yes Historical Provider, MD  allopurinol (ZYLOPRIM) 100 MG tablet Take 1 tablet (100 mg total) by mouth 2 (two) times daily. 11/30/14  Yes Quillian Quince  J Angiulli, PA-C  brimonidine-timolol (COMBIGAN) 0.2-0.5 % ophthalmic solution Place 1 drop into the left eye 2 (two) times daily.    Yes Historical Provider, MD  Carboxymethylcellul-Glycerin (REFRESH OPTIVE) 1-0.9 % GEL Apply 1 drop to eye 4 (four) times daily. Pt uses in the left eye.   Yes Historical Provider, MD  Cholecalciferol (VITAMIN D-3) 1000 UNITS CAPS Take 1 capsule (1,000 Units total) by mouth daily. 11/30/14  Yes Daniel J Angiulli, PA-C  cholestyramine (QUESTRAN) 4 G packet Take 1 packet (4 g total) by mouth 3 (three) times daily. 11/30/14  Yes Daniel J Angiulli, PA-C  colchicine 0.6 MG tablet Take 0.6 mg by mouth daily as needed (for gout flares).   Yes Historical Provider, MD  ferrous sulfate 325 (65 FE) MG tablet Take 1 tablet (325 mg total) by  mouth 3 (three) times daily with meals. 11/30/14  Yes Daniel J Angiulli, PA-C  furosemide (LASIX) 40 MG tablet Take 1 tablet (40 mg total) by mouth daily. 11/30/14  Yes Daniel J Angiulli, PA-C  insulin aspart (NOVOLOG) 100 UNIT/ML injection Inject 20-25 Units into the skin 3 (three) times daily before meals. Pt uses 20 units at breakfast and 25 units at lunch and supper. Pt also adds units of insulin based on a sliding scale:  200-250:  Add 3 units  251-300:  Add 6 units  301-350:  Add 9 units  351-400:  Add 12 units  Over 401:  Add 15 units   Yes Historical Provider, MD  insulin glargine (LANTUS) 100 UNIT/ML injection Inject 0.7 mLs (70 Units total) into the skin at bedtime. Patient taking differently: Inject 80 Units into the skin at bedtime.  11/30/14  Yes Daniel J Angiulli, PA-C  lactulose (CHRONULAC) 10 GM/15ML solution Take 45 mLs (30 g total) by mouth 2 (two) times daily. Patient taking differently: Take 20 g by mouth 2 (two) times daily.  11/30/14  Yes Daniel J Angiulli, PA-C  omeprazole (PRILOSEC) 20 MG capsule Take 1 capsule (20 mg total) by mouth 2 (two) times daily before a meal. 11/30/14  Yes Daniel J Angiulli, PA-C  ondansetron (ZOFRAN) 4 MG tablet Take 4 mg by mouth every 8 (eight) hours as needed for nausea or vomiting.   Yes Historical Provider, MD  rifaximin (XIFAXAN) 550 MG TABS tablet Take 1 tablet (550 mg total) by mouth 2 (two) times daily. 11/30/14  Yes Daniel J Angiulli, PA-C  sertraline (ZOLOFT) 25 MG tablet Take 25 mg by mouth daily.   Yes Historical Provider, MD  traMADol (ULTRAM) 50 MG tablet Take 1 tablet (50 mg total) by mouth every 8 (eight) hours as needed for moderate pain. 11/30/14  Yes Daniel J Angiulli, PA-C    Current Facility-Administered Medications  Medication Dose Route Frequency Provider Last Rate Last Dose  . 0.9 %  sodium chloride infusion   Intravenous Continuous Dustin Flock, MD      . acetaminophen (TYLENOL) tablet 650 mg  650 mg Oral Q6H PRN  Dustin Flock, MD       Or  . acetaminophen (TYLENOL) suppository 650 mg  650 mg Rectal Q6H PRN Dustin Flock, MD      . acetaminophen (TYLENOL) tablet 650 mg  650 mg Oral Q4H PRN Dustin Flock, MD      . allopurinol (ZYLOPRIM) tablet 100 mg  100 mg Oral BID Dustin Flock, MD      . brimonidine-timolol (COMBIGAN) 0.2-0.5 % ophthalmic solution 1 drop  1 drop Left Eye BID Dustin Flock, MD      .  Carboxymethylcellul-Glycerin 1-0.9 % GEL 1 drop  1 drop Ophthalmic QID Dustin Flock, MD      . cholestyramine Lucrezia Starch) packet 4 g  4 g Oral TID Dustin Flock, MD      . colchicine tablet 0.6 mg  0.6 mg Oral Daily PRN Dustin Flock, MD      . furosemide (LASIX) tablet 40 mg  40 mg Oral Daily Dustin Flock, MD      . insulin aspart (novoLOG) injection 0-9 Units  0-9 Units Subcutaneous TID WC Dustin Flock, MD      . ipratropium-albuterol (DUONEB) 0.5-2.5 (3) MG/3ML nebulizer solution 3 mL  3 mL Nebulization Q4H Dustin Flock, MD      . lidocaine-EPINEPHrine (XYLOCAINE-EPINEPHrine) 1 %-1:200000 (PF) injection           . pantoprazole (PROTONIX) 80 mg in sodium chloride 0.9 % 100 mL IVPB  80 mg Intravenous Once Dustin Flock, MD      . pantoprazole (PROTONIX) 80 mg in sodium chloride 0.9 % 250 mL (0.32 mg/mL) infusion  8 mg/hr Intravenous Continuous Dustin Flock, MD      . rifaximin Doreene Nest) tablet 550 mg  550 mg Oral BID Dustin Flock, MD      . sertraline (ZOLOFT) tablet 25 mg  25 mg Oral Daily Dustin Flock, MD      . traMADol Veatrice Bourbon) tablet 50 mg  50 mg Oral Q8H PRN Dustin Flock, MD      . Vitamin D-3 CAPS 1,000 Units  1,000 Units Oral Daily Dustin Flock, MD       Current Outpatient Prescriptions  Medication Sig Dispense Refill  . acetaminophen (TYLENOL) 325 MG tablet Take 650 mg by mouth every 4 (four) hours as needed for mild pain. 1-2 tablets every 4-6 hours as needed for pain.    Marland Kitchen allopurinol (ZYLOPRIM) 100 MG tablet Take 1 tablet (100 mg total) by mouth 2 (two) times  daily. 60 tablet 1  . brimonidine-timolol (COMBIGAN) 0.2-0.5 % ophthalmic solution Place 1 drop into the left eye 2 (two) times daily.     . Carboxymethylcellul-Glycerin (REFRESH OPTIVE) 1-0.9 % GEL Apply 1 drop to eye 4 (four) times daily. Pt uses in the left eye.    . Cholecalciferol (VITAMIN D-3) 1000 UNITS CAPS Take 1 capsule (1,000 Units total) by mouth daily. 30 capsule 1  . cholestyramine (QUESTRAN) 4 G packet Take 1 packet (4 g total) by mouth 3 (three) times daily. 60 each 12  . colchicine 0.6 MG tablet Take 0.6 mg by mouth daily as needed (for gout flares).    . ferrous sulfate 325 (65 FE) MG tablet Take 1 tablet (325 mg total) by mouth 3 (three) times daily with meals. 90 tablet 3  . furosemide (LASIX) 40 MG tablet Take 1 tablet (40 mg total) by mouth daily. 30 tablet 1  . insulin aspart (NOVOLOG) 100 UNIT/ML injection Inject 20-25 Units into the skin 3 (three) times daily before meals. Pt uses 20 units at breakfast and 25 units at lunch and supper. Pt also adds units of insulin based on a sliding scale:  200-250:  Add 3 units  251-300:  Add 6 units  301-350:  Add 9 units  351-400:  Add 12 units  Over 401:  Add 15 units    . insulin glargine (LANTUS) 100 UNIT/ML injection Inject 0.7 mLs (70 Units total) into the skin at bedtime. (Patient taking differently: Inject 80 Units into the skin at bedtime. ) 10 mL 11  . lactulose ([place])  10 GM/15ML solution Take 45 mLs (30 g total) by mouth 2 (two) times daily. (Patient taking differently: Take 20 g by mouth 2 (two) times daily. ) 240 mL 0  . omeprazole (PRILOSEC) 20 MG capsule Take 1 capsule (20 mg total) by mouth 2 (two) times daily before a meal. 60 capsule 1  . ondansetron (ZOFRAN) 4 MG tablet Take 4 mg by mouth every 8 (eight) hours as needed for nausea or vomiting.    . rifaximin (XIFAXAN) 550 MG TABS tablet Take 1 tablet (550 mg total) by mouth 2 (two) times daily. 60 tablet 1  . sertraline (ZOLOFT) 25 MG tablet Take 25 mg by mouth  daily.    . traMADol (ULTRAM) 50 MG tablet Take 1 tablet (50 mg total) by mouth every 8 (eight) hours as needed for moderate pain. 90 tablet 0    Allergies as of 08/08/2015 - Review Complete 08/08/2015  Allergen Reaction Noted  . Actos [pioglitazone] Anaphylaxis 09/26/2014  . Aminophylline Other (See Comments) 07/19/2015  . Amitriptyline Other (See Comments) 09/26/2014  . Ampicillin Hives 07/19/2015  . Hctz [hydrochlorothiazide] Other (See Comments) 09/26/2014  . Naproxen Other (See Comments) 09/26/2014  . Penicillins Rash 09/26/2014     Review of Systems:    A 12 system review was obtained and all negative except where noted in HPI.    Physical Exam:  Vital signs in last 24 hours: Temp:  [98.3 F (36.8 C)-98.5 F (36.9 C)] 98.3 F (36.8 C) (08/18 1338) Pulse Rate:  [79-88] 82 (08/18 1515) Resp:  [13-18] 18 (08/18 1515) BP: (134-145)/(49-78) 142/78 mmHg (08/18 1515) SpO2:  [98 %-100 %] 99 % (08/18 1515) Weight:  [78.926 kg (174 lb)] 78.926 kg (174 lb) (08/18 1030)    General:  Chronically ill cirrhotic looking male, NDA. Jaundiced, head bandaged Head:  Head without obvious abnormality, atraumatic  Eyes:   Conjunctiva pink, sclera mildly icteric   ENT:   Mouth free of lesions, mucosa moist, tongue pink, no thrush noted, teeth and gums normal Neck:   Supple w/o thyromegaly or mass, trachea midline, no adenopathy  Lungs: Clear to auscultation bilaterally, respirations unlabored Heart:     Normal S1S2, no rubs, murmurs, gallops. Abdomen: Protuberant, distended suspect ascites, non tender positive hernia, no  mass and BS normal Rectal: Deferred Lymph:  No cervical or supraclavicular adenopathy. Extremities:   No edema, cyanosis, or clubbing, bruising Skin  Skin color, texture, turgor normal, no rashes or lesions Neuro:  A&O x 3. CNII-XII intact, normal strength Psych:  Appropriate mood and affect.  Data Reviewed:  LAB RESULTS:  Recent Labs  08/08/15 1040  WBC 3.3*   HGB 6.8*  HCT 20.5*  PLT 52*   BMET  Recent Labs  08/08/15 1040  NA 138  K 3.8  CL 110  CO2 25  GLUCOSE 65  BUN 11  CREATININE 0.63  CALCIUM 8.7*   LFT  Recent Labs  08/08/15 1040  PROT 7.3  ALBUMIN 3.1*  AST 73*  ALT 27  ALKPHOS 114  BILITOT 3.7*   PT/INR No results for input(s): LABPROT, INR in the last 72 hours.  STUDIES: Ct Head Wo Contrast  08/08/2015   CLINICAL DATA:  [place].  Hit head today.  EXAM: CT HEAD WITHOUT CONTRAST  TECHNIQUE: Contiguous axial images were obtained from the base of the skull through the vertex without intravenous contrast.  COMPARISON:  06/11/2014  FINDINGS: Stable age related cerebral atrophy, ventriculomegaly and periventricular white matter disease. There is a small  left final subdural hematoma without mass effect. It measures approximately 5 mm in diameter. No CT findings for acute hemispheric infarction or intracranial hemorrhage. No mass lesions. The brainstem and cerebellum are normal.  There is a the right frontal scalp hematoma but no underlying skull fracture. The paranasal sinuses and mastoid air cells are clear. The globes are intact.  IMPRESSION: 1. Small left frontal subdural hematoma. Recommend follow-up imaging. 2. Right frontal scalp hematoma but no underlying skull fracture. 3. Stable age related cerebral atrophy, ventriculomegaly and periventricular white matter disease.   Electronically Signed   By: Marijo Sanes M.D.   On: 08/08/2015 16:43     Assessment/Plan:  Luke ODONOHUE Sr. is a 69 y.o. with history of non-alcoholic liver cirrhosis presents with dark color stools per history and red stools x 3 today- large amount per his wife. He had a drop in Hgb recorded 9 days ago at 7.3 and received 2 unit PRBC as an outpatient. Today, Hgb 6.2 and he has received 1 unit PRBC- another unit ordered. 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 His last colonoscopy was 3 years ago. He had a negative capsule 2 years ago.  He has pancytopenia related to liver disease. No GI complaints of n/v/pain.   Acute GI bleed: Suspected upper in nature again and he is on a Protonix drip,  hemoglobins followed closely transfused. Repeat EGD in the AM and if negative, a colonoscopy will need to be performed before discharge home.   This case was discussed with Dr. Manya Silvas in collaboration of care. Thank you for the consultation.  These services provided by Denice Paradise RN, MSN, ANP-BC under collaborative practice agreement with Manya Silvas, MD.   08/08/2015, 4:48 PM

## 2015-08-08 NOTE — ED Provider Notes (Addendum)
Patient with small left frontal subdural hematoma on CT scan but no underlying skull fracture. I discussed this with the hospitalist who is not comfortable admitting anymore and asked me to transfer the patient  I discussed this with patient and family.   Patient requested transfer to [facility] Performed by: Lavonia Drafts   Total critical care time: 30  Critical care time was exclusive of separately billable procedures and treating other patients.  Critical care was necessary to treat or prevent imminent or life-threatening deterioration.  Critical care was time spent personally by me on the following activities: development of treatment plan with patient and/or surrogate as well as nursing, discussions with consultants, evaluation of patient's response to treatment, examination of patient, obtaining history from patient or surrogate, ordering and performing treatments and interventions, ordering and review of laboratory studies, ordering and review of radiographic studies, pulse oximetry and re-evaluation of patient's condition.   Lavonia Drafts, MD 08/08/15 1712   I discussed with Dr. Madalyn Rob of the intensive care [facility], he recommended speaking with internal medicine which I will do  Lavonia Drafts, MD 08/08/15 1800

## 2015-08-08 NOTE — ED Notes (Signed)
Patient transported to CT 

## 2015-08-08 NOTE — H&P (Signed)
[facility] History and Physical  Donovan Persley JAS:[id number] DOB: [birth year] DOA: 08/08/2015  Referring physician: ED physician PCP: Adrian Prows, MD  Specialists:   Chief Complaint: rectal bleeding, fall with head injury  HPI: Luke Manner Sr. is a 69 y.o. male with PMH of multiple GI bleeding, retention, hyperlipidemia, diabetes mellitus, COPD, gout, depression, NASh, cirrhosis, OSA not on CPAP, RLS, nearly blinded, who presents with rectal bleeding and fall with head injury.  Patient is a transfer from [facility] hospital due to GI bleeding and lack of GI coverage.  Patient reports that his nurse noticed that his stools were very dark color in nature today. Per his wife, he had 3 bowel movements that were red and watery per his wife. He had smaller red bowel movement in the ER. Patient does not have chest pain, dizziness, shortness of breath, palpitation, nausea, vomiting, abdominal pain. He was found to have Hgb 6.8. Patient actually went to the cancer center yesterday and was transfused with blood. He reports that he had drop in his Hgb from 10.8 to 7.3 at 8 days ago and received 2 units PRBC at the outpatient hematology clinic. Of note, he had fall in ED. He fell forward while on the commode in the ER and lacerated his right forehead and has received 5 sutures. He has a sore right knee. He reports having chronic neck pain, which has not increased, but patient was placed on C collar in ED. CT-head showed small left frontal subdural hematoma, 81mm; right frontal scalp hematoma but no underlying skull fracture.  Per GI's note, pt had colonoscopy 07/01/2012 which showed 5 small polyps bd medium sized IH. He had an EGD 07/01/2012 that showed gastritis. He had a normal capsule endoscopy in 03/28/2013. 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   In ED, patient was found to have hemoglobin 6.8, temperature normal, no tachycardia, electrolytes okay. Patient is admitted to  inpatient for further evaluation and treatment. GI was consulted. X-ray of right knee has no acute bony abnormalities.  Where does patient live?   At home   Can patient participate in ADLs?  Yes    Review of Systems:   General: no fevers, chills, no changes in body weight, has poor appetite, has fatigue HEENT: no blurry vision, hearing changes or sore throat. Has neck pain wearing C collar. Pulm: no dyspnea, coughing, wheezing CV: no chest pain, palpitations Abd: no nausea, vomiting, abdominal pain, diarrhea, constipation GU: no dysuria, burning on urination, increased urinary frequency, hematuria. Has rectal bleeding and abdominal distention  Ext: mild leg edema Neuro: no unilateral weakness, numbness, or tingling, no vision change or hearing loss Skin: no rash MSK: No muscle spasm, no deformity, no limitation of range of movement in spin Heme: No easy bruising.  Travel history: No recent long distant travel.  Allergy:  Allergies  Allergen Reactions  . Actos [Pioglitazone] Anaphylaxis  . Aminophylline Other (See Comments)    Reaction: Unknown  . Amitriptyline Other (See Comments)    Reaction: Unknown   . Ampicillin Hives  . Hctz [Hydrochlorothiazide] Other (See Comments)    Reaction: Unknown   . Naproxen Other (See Comments)    Reaction: Unknown   . Penicillins Rash    Past Medical History  Diagnosis Date  . Stroke   . Hypertension   . COPD (chronic obstructive pulmonary disease)   . Diabetes mellitus without complication   . H/O hiatal hernia   . Depression   .  Arthritis   . Hepatitis     NASH  . Cirrhosis of liver not due to alcohol   . Obstructive sleep apnea 11/20/2014  . Hyperlipidemia   . Hiatal hernia   . GI bleeds, multiple during admission   . Restless leg syndrome     Past Surgical History  Procedure Laterality Date  . Nose surgery    . Appendectomy    . Cardiac catheterization    . Eye surgery      leaglly blind  . Anterior cervical  decompression/discectomy fusion 4 levels N/A 11/13/2014    Procedure:  Cervical three-four, Cervical four-five, Cervical five-six, Cervical six- seven anterior cervical decompression with fusion and interbody prosthesis plating and bonegraft;  Surgeon: Consuella Lose, MD;  Location: [facility];  Service: Neurosurgery;  Laterality: N/A;    Social History:  reports that he has never smoked. He has never used smokeless tobacco. He reports that he does not drink alcohol. His drug history is not on file.  Family History:  Family History  Problem Relation Age of Onset  . Kidney failure Sister     CKD  . Diabetes Mellitus II Sister      Prior to Admission medications   Medication Sig Start Date End Date Taking? Authorizing Adalie Mand  acetaminophen (TYLENOL) 325 MG tablet Take 650 mg by mouth every 4 (four) hours as needed for mild pain. 1-2 tablets every 4-6 hours as needed for pain.   Yes Historical Ahmaya Ostermiller, MD  allopurinol (ZYLOPRIM) 100 MG tablet Take 1 tablet (100 mg total) by mouth 2 (two) times daily. 11/30/14  Yes Daniel J Angiulli, PA-C  brimonidine-timolol (COMBIGAN) 0.2-0.5 % ophthalmic solution Place 1 drop into the left eye 2 (two) times daily.    Yes Historical Jannah Guardiola, MD  Carboxymethylcellul-Glycerin (REFRESH OPTIVE) 1-0.9 % GEL Apply 1 drop to eye 4 (four) times daily. Pt uses in the left eye.   Yes Historical Bilan Tedesco, MD  Cholecalciferol (VITAMIN D-3) 1000 UNITS CAPS Take 1 capsule (1,000 Units total) by mouth daily. 11/30/14  Yes Daniel J Angiulli, PA-C  cholestyramine (QUESTRAN) 4 G packet Take 1 packet (4 g total) by mouth 3 (three) times daily. 11/30/14  Yes Daniel J Angiulli, PA-C  colchicine 0.6 MG tablet Take 0.6 mg by mouth daily as needed (for gout flares).   Yes Historical Evonna Stoltz, MD  ferrous sulfate 325 (65 FE) MG tablet Take 1 tablet (325 mg total) by mouth 3 (three) times daily with meals. 11/30/14  Yes Daniel J Angiulli, PA-C  furosemide (LASIX) 40 MG tablet  Take 1 tablet (40 mg total) by mouth daily. 11/30/14  Yes Daniel J Angiulli, PA-C  insulin aspart (NOVOLOG) 100 UNIT/ML injection Inject 20-25 Units into the skin 3 (three) times daily before meals. Pt uses 20 units at breakfast and 25 units at lunch and supper. Pt also adds units of insulin based on a sliding scale:  200-250:  Add 3 units  251-300:  Add 6 units  301-350:  Add 9 units  351-400:  Add 12 units  Over 401:  Add 15 units   Yes Historical Stephine Langbehn, MD  insulin glargine (LANTUS) 100 UNIT/ML injection Inject 0.7 mLs (70 Units total) into the skin at bedtime. Patient taking differently: Inject 80 Units into the skin at bedtime.  11/30/14  Yes Daniel J Angiulli, PA-C  lactulose (CHRONULAC) 10 GM/15ML solution Take 45 mLs (30 g total) by mouth 2 (two) times daily. Patient taking differently: Take 20 g by mouth 2 (two)  times daily.  11/30/14  Yes Daniel J Angiulli, PA-C  omeprazole (PRILOSEC) 20 MG capsule Take 1 capsule (20 mg total) by mouth 2 (two) times daily before a meal. 11/30/14  Yes Daniel J Angiulli, PA-C  ondansetron (ZOFRAN) 4 MG tablet Take 4 mg by mouth every 8 (eight) hours as needed for nausea or vomiting.   Yes Historical Marimar Suber, MD  rifaximin (XIFAXAN) 550 MG TABS tablet Take 1 tablet (550 mg total) by mouth 2 (two) times daily. 11/30/14  Yes Daniel J Angiulli, PA-C  sertraline (ZOLOFT) 25 MG tablet Take 25 mg by mouth daily.   Yes Historical Emile Kyllo, MD  traMADol (ULTRAM) 50 MG tablet Take 1 tablet (50 mg total) by mouth every 8 (eight) hours as needed for moderate pain. 11/30/14  Yes Cathlyn Parsons, PA-C    Physical Exam: Filed Vitals:   08/08/15 2115 08/09/15 0011  BP: 143/62 133/60  Pulse: 89 88  Temp: 97.9 F (36.6 C) 98.3 F (36.8 C)  TempSrc: Oral Oral  Resp: 15 17  Height: 5\' 4"  (1.626 m)   Weight: 78.7 kg (173 lb 8 oz)   SpO2: 100% 100%   General: Not in acute distress HEENT: Has skin tear over right frontal area, sutured, no active bleeding.        Eyes: PERRL, EOMI, no scleral icterus.       ENT: No discharge from the ears and nose, no pharynx injection, no tonsillar enlargement.        Neck: No JVD, no bruit, no mass felt. Wearing C collar, with neck pain Heme: No neck lymph node enlargement. Cardiac: S1/S2, RRR, No murmurs, No gallops or rubs. Pulm:  No rales, wheezing, rhonchi or rubs. Abd: Soft, distended, nontender, no rebound pain, no organomegaly, BS present. Ext: No pitting leg edema bilaterally. 2+DP/PT pulse bilaterally. Musculoskeletal: No joint deformities, No joint redness or warmth, no limitation of ROM in spin. Skin: No rashes.  Neuro: Alert, oriented X3, cranial nerves II-XII grossly intact, muscle strength 5/5 in all extremities, sensation to light touch intact. Brachial reflex 2+ bilaterally. Knee reflex 1+ bilaterally. Negative Babinski's sign. Normal finger to nose test. Psych: Patient is not psychotic, no suicidal or hemocidal ideation.  Labs on Admission:  Basic Metabolic Panel:  Recent Labs Lab 08/08/15 1040  NA 138  K 3.8  CL 110  CO2 25  GLUCOSE 65  BUN 11  CREATININE 0.63  CALCIUM 8.7*   Liver Function Tests:  Recent Labs Lab 08/08/15 1040  AST 73*  ALT 27  ALKPHOS 114  BILITOT 3.7*  PROT 7.3  ALBUMIN 3.1*   No results for input(s): LIPASE, AMYLASE in the last 168 hours. No results for input(s): AMMONIA in the last 168 hours. CBC:  Recent Labs Lab 08/08/15 1040 08/09/15 0127  WBC 3.3* 3.3*  HGB 6.8* 7.3*  HCT 20.5* 21.6*  MCV 94.3 92.3  PLT 52* 54*   Cardiac Enzymes: No results for input(s): CKTOTAL, CKMB, CKMBINDEX, TROPONINI in the last 168 hours.  BNP (last 3 results) No results for input(s): BNP in the last 8760 hours.  ProBNP (last 3 results) No results for input(s): PROBNP in the last 8760 hours.  CBG:  Recent Labs Lab 08/08/15 2153 08/09/15 0142 08/09/15 0208  GLUCAP 81 68 110*    Radiological Exams on Admission: Ct Head Wo Contrast  08/08/2015    CLINICAL DATA:  [place].  Hit head today.  EXAM: CT HEAD WITHOUT CONTRAST  TECHNIQUE: Contiguous axial images were obtained from  the base of the skull through the vertex without intravenous contrast.  COMPARISON:  06/11/2014  FINDINGS: Stable age related cerebral atrophy, ventriculomegaly and periventricular white matter disease. There is a small left final subdural hematoma without mass effect. It measures approximately 5 mm in diameter. No CT findings for acute hemispheric infarction or intracranial hemorrhage. No mass lesions. The brainstem and cerebellum are normal.  There is a the right frontal scalp hematoma but no underlying skull fracture. The paranasal sinuses and mastoid air cells are clear. The globes are intact.  IMPRESSION: 1. Small left frontal subdural hematoma. Recommend follow-up imaging. 2. Right frontal scalp hematoma but no underlying skull fracture. 3. Stable age related cerebral atrophy, ventriculomegaly and periventricular white matter disease.   Electronically Signed   By: Marijo Sanes M.D.   On: 08/08/2015 16:43   Dg Knee Complete 4 Views Right  08/08/2015   CLINICAL DATA:  Fall, right anterior knee abrasion  EXAM: RIGHT KNEE - COMPLETE 4+ VIEW  COMPARISON:  None.  FINDINGS: No fracture or dislocation is seen.  Suspected old posttraumatic deformity to the proximal tibia.  Mild tricompartmental degenerative changes.  Small suprapatellar knee joint effusion.  IMPRESSION: No fracture or dislocation is seen.  Mild degenerative changes.  Small suprapatellar knee joint effusion.   Electronically Signed   By: Julian Hy M.D.   On: 08/08/2015 16:57    EKG: Not done in ED, will get one.   Assessment/Plan Principal Problem:   GI bleed Active Problems:   Stenosis of cervical spine region   Type 2 diabetes mellitus, uncontrolled, with retinopathy   History of COPD   NASH (nonalcoholic steatohepatitis)   Cognitive deficits   Iron deficiency anemia   Gastritis   Stroke    Hypertension   Depression   Hepatitis   Cirrhosis of liver not due to alcohol   Hyperlipidemia   Pancytopenia   GIB (gastrointestinal bleeding)   Fall   SDH (subdural hematoma)   Gout  GI bleed: GI PA already saw pt and suspected upper bleeding, recommended IV protonix and plan to do EGD in the AM. Per GI note, pt has received 1 unit PRBC- another unit ordered. Chart review showed that patient only received 1 unit of blood. Currently patient is hemodynamically stable, will not transfuse more blood at this time.  - will admit to tele bed - GI consulted, will follow up recommendations - NPO for possible EGD  - NS at 100 mL/hr - Start IV pantoprazole 40 mg bib - Hydroxyzine IV for nausea - Avoid NSAIDs and SQ heparin - Maintain IV access (2 large bore IVs if possible). - Monitor closely and follow q6h cbc, transfuse as necessary. Goal of Hgb for transfusion is 7.0. - LaB: INR, PTT, FOBT  Stenosis of cervical spine region: patient has chronic neck pain, which has not changed. Given his significant head injury from fall, needs to rule out acute neck issues -CT- c spine -C collar on -tramadol - d/c tylenol due to cirrhosis  DM-II: Last A1c 5.4 on 07/19/15, well controled. Patient is taking Lantus at home -will decrease Lantus dose from 80 to 50 units daily -SSI  History of COPD: stable. Not on meds at home -observe closely  NASH (nonalcoholic steatohepatitis) and cirrhosis: Patient has abdominal extension, but no any pain, no signs of infection. AST and ALT are stable from previous test on 07/19/15, but total bilirubin increased from 1.7-->3.7. Mental status is at baseline, no signs of hepatic encephalopathy. -Continue home medications:  Questran, lactulose, rifaximin -Hold lasix due to GIB -check direct BR, LDH and hapatoglobin  Pancytopenia: Most likely due to GI bleeding. -Continue ferrous sulfate  Hypertension: was on lasix -hold lasix as above -IV hydralazine when  necessary  Depression: Stable, no suicidal or homicidal ideations. -Continue home medications: Zoloft  Fall and SDH and R frontal hematoma: CT-head showed small left frontal subdural hematoma, 11mm; right frontal scalp hematoma but no underlying skull fracture. Neurosurgeon was consulted, Dr. Joya Salm will see pt in AM and recommended to repeat CT-head in 48 hours. Currently no focal neurologic findings. Patient mental status is at baseline. -CT-C spine -Frequent neuro checks  Gout: stable. -Continue allopurinol and colchicine   DVT ppx: SCD  Code Status: Full code Family Communication: None at bed side.   Disposition Plan: Admit to inpatient   Date of Service 08/09/2015    Ivor Costa [facility] Pager [phone]  If 7PM-7AM, please contact night-coverage [email or web address] Password [email or web address] 08/09/2015, 3:01 AM

## 2015-08-08 NOTE — ED Notes (Signed)
Says rectal bleeding.  Had transfusions yesterday at cancer center.  But now bleeding again

## 2015-08-08 NOTE — ED Notes (Signed)
Admitting MD at bedside.

## 2015-08-08 NOTE — ED Provider Notes (Signed)
-----------------------------------------   3:30 PM on 08/08/2015 -----------------------------------------  Called to room by family. Patient gotten up to go to the bathroom and he fell getting off the toilet. He suffered a head laceration on the right forehead.   LACERATION REPAIR Performed by: Lavonia Drafts Authorized by: Lavonia Drafts Consent: Verbal consent obtained. Risks and benefits: risks, benefits and alternatives were discussed Consent given by: patient Patient identity confirmed: provided demographic data Prepped and Draped in normal sterile fashion Wound explored  Laceration Location:right forehead  Laceration Length: 3 cm  No Foreign Bodies seen or palpated  Anesthesia: local infiltration  Local anesthetic: lidocaine 1% with epinephrine  Anesthetic total: 4 ml  Irrigation method: syringe Amount of cleaning: standard  Skin closure: ethilon 5-0  Number of sutures: 5  Technique: simple interrupted  Patient tolerance: Patient tolerated the procedure well with no immediate complications.  We will obtain CT head  Lavonia Drafts, MD 08/08/15 [phone]

## 2015-08-08 NOTE — ED Provider Notes (Signed)
[facility] Emergency Department Provider Note  ____________________________________________  Time seen: On arrival  I have reviewed the triage vital signs and the nursing notes.   HISTORY  Chief Complaint Rectal Bleeding    HPI Luke AUBRY Sr. is a 69 y.o. male who presents with weakness and rectal bleeding. Patient reports a history of rectal bleeding with recent hospitalization. He notes he had an infusion yesterday. This morning he had a bowel movement and says that the water was red and there was blood mixed in with the stool. Recent endoscopy did not show source of bleeding. He does have a history of cirrhosis apparently. He has abdominal pain. No nausea vomiting     Past Medical History  Diagnosis Date  . Stroke   . Hypertension   . COPD (chronic obstructive pulmonary disease)   . Diabetes mellitus without complication   . H/O hiatal hernia   . Depression   . Arthritis   . Hepatitis     NASH  . Cirrhosis of liver not due to alcohol   . Obstructive sleep apnea 11/20/2014  . Hyperlipidemia   . Hiatal hernia   . GI bleeds, multiple during admission   . Restless leg syndrome     Patient Active Problem List   Diagnosis Date Noted  . Iron deficiency anemia   . Gastric polyp   . Gastritis   . Upper GI bleed 07/19/2015  . UTI (lower urinary tract infection) 11/23/2014  . Cognitive deficits 11/23/2014  . Thrombocytopenia 11/23/2014  . acute on chronic anemia 11/23/2014  . NASH (nonalcoholic steatohepatitis) 11/21/2014  . Stenosis of cervical spine region 11/20/2014  . Type 2 diabetes mellitus, uncontrolled, with retinopathy 11/20/2014  . History of COPD 11/20/2014  . Diabetes mellitus with diabetic retinopathy   . Acute respiratory acidosis   . Encounter for intubation   . Encounter for orogastric (OG) tube placement   . Surgery, other elective   . Cervical spondylosis with myelopathy 11/13/2014    Past Surgical History   Procedure Laterality Date  . Nose surgery    . Appendectomy    . Cardiac catheterization    . Eye surgery      leaglly blind  . Anterior cervical decompression/discectomy fusion 4 levels N/A 11/13/2014    Procedure:  Cervical three-four, Cervical four-five, Cervical five-six, Cervical six- seven anterior cervical decompression with fusion and interbody prosthesis plating and bonegraft;  Surgeon: Consuella Lose, MD;  Location: [place] [facility];  Service: Neurosurgery;  Laterality: N/A;    Current Outpatient Rx  Name  Route  Sig  Dispense  Refill  . acetaminophen (TYLENOL) 325 MG tablet   Oral   Take 650 mg by mouth every 4 (four) hours as needed (Pain). 1-2 tablets every 4-6 hours as needed for pain.         Marland Kitchen allopurinol (ZYLOPRIM) 100 MG tablet   Oral   Take 1 tablet (100 mg total) by mouth 2 (two) times daily.   60 tablet   1   . brimonidine-timolol (COMBIGAN) 0.2-0.5 % ophthalmic solution   Left Eye   Place 1 drop into the left eye every 12 (twelve) hours.         . Cholecalciferol (VITAMIN D-3) 1000 UNITS CAPS   Oral   Take 1 capsule (1,000 Units total) by mouth daily.   30 capsule   1   . cholestyramine (QUESTRAN) 4 G packet   Oral   Take 1 packet (4 g total) by  mouth 3 (three) times daily.   60 each   12   . ferrous sulfate 325 (65 FE) MG tablet   Oral   Take 1 tablet (325 mg total) by mouth 3 (three) times daily with meals.   90 tablet   3   . furosemide (LASIX) 40 MG tablet   Oral   Take 1 tablet (40 mg total) by mouth daily.   30 tablet   1   . glimepiride (AMARYL) 1 MG tablet      TAKE ONE TABLET BY MOUTH EVERY DAY WITH BREAKFAST   30 tablet   0     Future refills to go to primary care MD   . insulin aspart (NOVOLOG) 100 UNIT/ML injection   Subcutaneous   Inject 20-25 Units into the skin 3 (three) times daily before meals. 20 units before breakfast; 25 units before lunch and supper.         . insulin aspart (NOVOLOG) 100 UNIT/ML  injection   Subcutaneous   Inject 3-15 Units into the skin 3 (three) times daily before meals. Per sliding scale, before meals; blood sugar 200-250 add 3 units; 251-300 add 6 units; 301-350 add 9 units; 351-400 add 12 units; over 401 add 15 units.         . insulin glargine (LANTUS) 100 UNIT/ML injection   Subcutaneous   Inject 0.7 mLs (70 Units total) into the skin at bedtime. Patient taking differently: Inject 80 Units into the skin at bedtime.    10 mL   11   . lactulose (CHRONULAC) 10 GM/15ML solution   Oral   Take 45 mLs (30 g total) by mouth 2 (two) times daily. Patient taking differently: Take 20 g by mouth 2 (two) times daily.    240 mL   0   . methocarbamol (ROBAXIN) 500 MG tablet   Oral   Take 1 tablet (500 mg total) by mouth 3 (three) times daily as needed for muscle spasms.   90 tablet   0   . omeprazole (PRILOSEC) 20 MG capsule   Oral   Take 1 capsule (20 mg total) by mouth 2 (two) times daily before a meal.   60 capsule   1   . rifaximin (XIFAXAN) 550 MG TABS tablet   Oral   Take 1 tablet (550 mg total) by mouth 2 (two) times daily.   60 tablet   1   . sertraline (ZOLOFT) 25 MG tablet      TAKE ONE TABLET BY MOUTH EVERY DAY   30 tablet   0   . traMADol (ULTRAM) 50 MG tablet   Oral   Take 1 tablet (50 mg total) by mouth every 8 (eight) hours as needed for moderate pain.   90 tablet   0     Allergies Actos; Aminophylline; Amitriptyline; Ampicillin; Hctz; Naproxen; and Penicillins  Family History  Problem Relation Age of Onset  . Kidney failure Sister     CKD  . Diabetes Mellitus II Sister     Social History Social History  Substance Use Topics  . Smoking status: Never Smoker   . Smokeless tobacco: Never Used  . Alcohol Use: No    Review of Systems  Constitutional: Negative for fever. Positive for weakness diffusely Eyes: Negative for visual changes. ENT: Negative for sore throat Cardiovascular: Negative for chest  pain. Respiratory: Negative for shortness of breath. Gastrointestinal: Negative for abdominal pain, vomiting and diarrhea. Genitourinary: Negative for dysuria. Musculoskeletal: Negative for back  pain. Skin: Negative for rash. Neurological: Negative for headaches or focal weakness Psychiatric: No anxiety    ____________________________________________   PHYSICAL EXAM:  VITAL SIGNS: ED Triage Vitals  Enc Vitals Group     BP 08/08/15 1043 134/49 mmHg     Pulse Rate 08/08/15 1030 88     Resp 08/08/15 1030 16     Temp 08/08/15 1030 98.4 F (36.9 C)     Temp Source 08/08/15 1030 Oral     SpO2 08/08/15 1137 98 %     Weight 08/08/15 1030 174 lb (78.926 kg)     Height 08/08/15 1030 5\' 4"  (1.626 m)     Head Cir --      Peak Flow --      Pain Score --      Pain Loc --      Pain Edu? --      Excl. in [place]? --      Constitutional: Alert and oriented. Well appearing and in no distress. Eyes: Conjunctivae are normal.  ENT   Head: Normocephalic and atraumatic.   Mouth/Throat: Mucous membranes are moist. Cardiovascular: Normal rate, regular rhythm. Normal and symmetric distal pulses are present in all extremities. No murmurs, rubs, or gallops. Respiratory: Normal respiratory effort without tachypnea nor retractions. Breath sounds are clear and equal bilaterally.  Gastrointestinal: Soft and non-tender in all quadrants. Positive distention. There is no CVA tenderness. Guaiac positive, black stool Genitourinary: deferred Musculoskeletal: Nontender with normal range of motion in all extremities. No lower extremity tenderness nor edema. Neurologic:  Normal speech and language. No gross focal neurologic deficits are appreciated. Skin:  Skin is warm, dry and intact. No rash noted. Psychiatric: Mood and affect are normal. Patient exhibits appropriate insight and judgment.  ____________________________________________    LABS (pertinent positives/negatives)  Labs Reviewed   COMPREHENSIVE METABOLIC PANEL - Abnormal; Notable for the following:    Calcium 8.7 (*)    Albumin 3.1 (*)    AST 73 (*)    Total Bilirubin 3.7 (*)    Anion gap 3 (*)    All other components within normal limits  CBC - Abnormal; Notable for the following:    WBC 3.3 (*)    RBC 2.18 (*)    Hemoglobin 6.8 (*)    HCT 20.5 (*)    RDW 19.5 (*)    Platelets 52 (*)    All other components within normal limits  TYPE AND SCREEN  PREPARE RBC (CROSSMATCH)    ____________________________________________   EKG  None  ____________________________________________    RADIOLOGY I have personally reviewed any xrays that were ordered on this patient: None  ____________________________________________   PROCEDURES  Procedure(s) performed: none  Critical Care performed:yes  CRITICAL CARE Performed by: Lavonia Drafts   Total critical care time: 30  Critical care time was exclusive of separately billable procedures and treating other patients.  Critical care was necessary to treat or prevent imminent or life-threatening deterioration.  Critical care was time spent personally by me on the following activities: development of treatment plan with patient and/or surrogate as well as nursing, discussions with consultants, evaluation of patient's response to treatment, examination of patient, obtaining history from patient or surrogate, ordering and performing treatments and interventions, ordering and review of laboratory studies, ordering and review of radiographic studies, pulse oximetry and re-evaluation of patient's condition.  ____________________________________________   INITIAL IMPRESSION / ASSESSMENT AND PLAN / ED COURSE  Pertinent labs & imaging results that were available during my care of  the patient were reviewed by me and considered in my medical decision making (see chart for details).  Patient reportedly had an infusion yesterday but reports today there was blood  in the toilet. He complains of feeling diffusely weak. He denies abdominal pain. Recent admission for similar complaints. Despite having an infusion yesterday's hemoglobin is 6.8 today and he is guaiac positive. I will admit him for GI bleed and give him 1 unit PRBC  ____________________________________________   FINAL CLINICAL IMPRESSION(S) / ED DIAGNOSES  Final diagnoses:  Gastrointestinal hemorrhage, unspecified gastritis, unspecified gastrointestinal hemorrhage type     Lavonia Drafts, MD 08/08/15 1322

## 2015-08-09 ENCOUNTER — Inpatient Hospital Stay (HOSPITAL_COMMUNITY): Payer: Medicare Other

## 2015-08-09 ENCOUNTER — Encounter (HOSPITAL_COMMUNITY): Payer: Self-pay | Admitting: *Deleted

## 2015-08-09 DIAGNOSIS — K7581 Nonalcoholic steatohepatitis (NASH): Secondary | ICD-10-CM

## 2015-08-09 DIAGNOSIS — K759 Inflammatory liver disease, unspecified: Secondary | ICD-10-CM | POA: Diagnosis present

## 2015-08-09 DIAGNOSIS — K7460 Unspecified cirrhosis of liver: Secondary | ICD-10-CM

## 2015-08-09 DIAGNOSIS — I10 Essential (primary) hypertension: Secondary | ICD-10-CM | POA: Diagnosis present

## 2015-08-09 DIAGNOSIS — F32A Depression, unspecified: Secondary | ICD-10-CM | POA: Diagnosis present

## 2015-08-09 DIAGNOSIS — S065XAA Traumatic subdural hemorrhage with loss of consciousness status unknown, initial encounter: Secondary | ICD-10-CM | POA: Diagnosis present

## 2015-08-09 DIAGNOSIS — I639 Cerebral infarction, unspecified: Secondary | ICD-10-CM | POA: Diagnosis present

## 2015-08-09 DIAGNOSIS — K922 Gastrointestinal hemorrhage, unspecified: Principal | ICD-10-CM

## 2015-08-09 DIAGNOSIS — E785 Hyperlipidemia, unspecified: Secondary | ICD-10-CM | POA: Diagnosis present

## 2015-08-09 DIAGNOSIS — F329 Major depressive disorder, single episode, unspecified: Secondary | ICD-10-CM | POA: Diagnosis present

## 2015-08-09 DIAGNOSIS — W19XXXA Unspecified fall, initial encounter: Secondary | ICD-10-CM | POA: Diagnosis present

## 2015-08-09 DIAGNOSIS — D61818 Other pancytopenia: Secondary | ICD-10-CM

## 2015-08-09 DIAGNOSIS — M109 Gout, unspecified: Secondary | ICD-10-CM | POA: Diagnosis present

## 2015-08-09 DIAGNOSIS — I6200 Nontraumatic subdural hemorrhage, unspecified: Secondary | ICD-10-CM

## 2015-08-09 DIAGNOSIS — S065X9A Traumatic subdural hemorrhage with loss of consciousness of unspecified duration, initial encounter: Secondary | ICD-10-CM | POA: Diagnosis present

## 2015-08-09 LAB — COMPREHENSIVE METABOLIC PANEL
ALT: 25 U/L (ref 17–63)
ANION GAP: 6 (ref 5–15)
AST: 65 U/L — AB (ref 15–41)
Albumin: 2.6 g/dL — ABNORMAL LOW (ref 3.5–5.0)
Alkaline Phosphatase: 111 U/L (ref 38–126)
BUN: 8 mg/dL (ref 6–20)
CHLORIDE: 105 mmol/L (ref 101–111)
CO2: 24 mmol/L (ref 22–32)
Calcium: 8.6 mg/dL — ABNORMAL LOW (ref 8.9–10.3)
Creatinine, Ser: 0.71 mg/dL (ref 0.61–1.24)
Glucose, Bld: 87 mg/dL (ref 65–99)
POTASSIUM: 4.3 mmol/L (ref 3.5–5.1)
Sodium: 135 mmol/L (ref 135–145)
Total Bilirubin: 4.7 mg/dL — ABNORMAL HIGH (ref 0.3–1.2)
Total Protein: 6.6 g/dL (ref 6.5–8.1)

## 2015-08-09 LAB — TYPE AND SCREEN
ABO/RH(D): AB POS
Antibody Screen: NEGATIVE
UNIT DIVISION: 0
Unit division: 0

## 2015-08-09 LAB — CBC
HCT: 21.6 % — ABNORMAL LOW (ref 39.0–52.0)
HCT: 22.1 % — ABNORMAL LOW (ref 39.0–52.0)
HEMATOCRIT: 21.9 % — AB (ref 39.0–52.0)
HEMOGLOBIN: 7.2 g/dL — AB (ref 13.0–17.0)
Hemoglobin: 7.3 g/dL — ABNORMAL LOW (ref 13.0–17.0)
Hemoglobin: 7.2 g/dL — ABNORMAL LOW (ref 13.0–17.0)
MCH: 31.2 pg (ref 26.0–34.0)
MCH: 30.6 pg (ref 26.0–34.0)
MCH: 30.5 pg (ref 26.0–34.0)
MCHC: 33.8 g/dL (ref 30.0–36.0)
MCHC: 32.9 g/dL (ref 30.0–36.0)
MCHC: 32.6 g/dL (ref 30.0–36.0)
MCV: 92.3 fL (ref 78.0–100.0)
MCV: 93.2 fL (ref 78.0–100.0)
MCV: 93.6 fL (ref 78.0–100.0)
PLATELETS: 54 10*3/uL — AB (ref 150–400)
PLATELETS: 51 10*3/uL — AB (ref 150–400)
PLATELETS: 62 10*3/uL — AB (ref 150–400)
RBC: 2.34 MIL/uL — AB (ref 4.22–5.81)
RBC: 2.35 MIL/uL — ABNORMAL LOW (ref 4.22–5.81)
RBC: 2.36 MIL/uL — AB (ref 4.22–5.81)
RDW: 20.3 % — AB (ref 11.5–15.5)
RDW: 20.4 % — AB (ref 11.5–15.5)
RDW: 20.4 % — ABNORMAL HIGH (ref 11.5–15.5)
WBC: 3.3 10*3/uL — ABNORMAL LOW (ref 4.0–10.5)
WBC: 3 10*3/uL — AB (ref 4.0–10.5)
WBC: 3.6 10*3/uL — AB (ref 4.0–10.5)

## 2015-08-09 LAB — GLUCOSE, CAPILLARY
GLUCOSE-CAPILLARY: 110 mg/dL — AB (ref 65–99)
GLUCOSE-CAPILLARY: 68 mg/dL (ref 65–99)
Glucose-Capillary: 102 mg/dL — ABNORMAL HIGH (ref 65–99)
Glucose-Capillary: 93 mg/dL (ref 65–99)
Glucose-Capillary: 163 mg/dL — ABNORMAL HIGH (ref 65–99)

## 2015-08-09 LAB — BILIRUBIN, DIRECT: BILIRUBIN DIRECT: 2.6 mg/dL — AB (ref 0.1–0.5)

## 2015-08-09 LAB — LACTATE DEHYDROGENASE: LDH: 147 U/L (ref 98–192)

## 2015-08-09 LAB — PROTIME-INR
INR: 1.79 — AB (ref 0.00–1.49)
PROTHROMBIN TIME: 20.8 s — AB (ref 11.6–15.2)

## 2015-08-09 LAB — APTT: APTT: 45 s — AB (ref 24–37)

## 2015-08-09 MED ORDER — RIFAXIMIN 550 MG PO TABS
550.0000 mg | ORAL_TABLET | Freq: Two times a day (BID) | ORAL | Status: DC
  Administered 2015-08-09 – 2015-08-12 (×8): 550 mg via ORAL
  Filled 2015-08-09 (×9): qty 1

## 2015-08-09 MED ORDER — SERTRALINE HCL 25 MG PO TABS
25.0000 mg | ORAL_TABLET | Freq: Every day | ORAL | Status: DC
  Administered 2015-08-09 – 2015-08-12 (×4): 25 mg via ORAL
  Filled 2015-08-09 (×4): qty 1

## 2015-08-09 MED ORDER — HYDROXYZINE HCL 50 MG/ML IM SOLN
25.0000 mg | Freq: Four times a day (QID) | INTRAMUSCULAR | Status: DC | PRN
  Filled 2015-08-09: qty 0.5

## 2015-08-09 MED ORDER — SODIUM CHLORIDE 0.9 % IV SOLN
INTRAVENOUS | Status: DC
  Administered 2015-08-09: 02:00:00 via INTRAVENOUS

## 2015-08-09 MED ORDER — TIMOLOL MALEATE 0.5 % OP SOLN
1.0000 [drp] | Freq: Two times a day (BID) | OPHTHALMIC | Status: DC
  Administered 2015-08-09 – 2015-08-12 (×7): 1 [drp] via OPHTHALMIC
  Filled 2015-08-09: qty 5

## 2015-08-09 MED ORDER — INSULIN ASPART 100 UNIT/ML ~~LOC~~ SOLN
0.0000 [IU] | Freq: Three times a day (TID) | SUBCUTANEOUS | Status: DC
  Administered 2015-08-10: 2 [IU] via SUBCUTANEOUS
  Administered 2015-08-10: 3 [IU] via SUBCUTANEOUS
  Administered 2015-08-10: 2 [IU] via SUBCUTANEOUS
  Administered 2015-08-11: 3 [IU] via SUBCUTANEOUS
  Administered 2015-08-11 (×2): 2 [IU] via SUBCUTANEOUS
  Administered 2015-08-12: 3 [IU] via SUBCUTANEOUS
  Administered 2015-08-12: 2 [IU] via SUBCUTANEOUS

## 2015-08-09 MED ORDER — PANTOPRAZOLE SODIUM 40 MG PO TBEC
40.0000 mg | DELAYED_RELEASE_TABLET | Freq: Every day | ORAL | Status: DC
  Administered 2015-08-10 – 2015-08-12 (×3): 40 mg via ORAL
  Filled 2015-08-09 (×3): qty 1

## 2015-08-09 MED ORDER — HYDRALAZINE HCL 20 MG/ML IJ SOLN: 5.0000 mg | INTRAMUSCULAR | Status: DC | PRN

## 2015-08-09 MED ORDER — BRIMONIDINE TARTRATE 0.2 % OP SOLN
1.0000 [drp] | Freq: Two times a day (BID) | OPHTHALMIC | Status: DC
  Administered 2015-08-09 – 2015-08-12 (×7): 1 [drp] via OPHTHALMIC
  Filled 2015-08-09: qty 5

## 2015-08-09 MED ORDER — MORPHINE SULFATE (PF) 2 MG/ML IV SOLN
1.0000 mg | Freq: Once | INTRAVENOUS | Status: AC
  Administered 2015-08-09: 1 mg via INTRAVENOUS
  Filled 2015-08-09: qty 1

## 2015-08-09 MED ORDER — PHYTONADIONE 5 MG PO TABS
10.0000 mg | ORAL_TABLET | Freq: Every day | ORAL | Status: DC
  Administered 2015-08-10 – 2015-08-11 (×2): 10 mg via ORAL
  Filled 2015-08-09 (×4): qty 2

## 2015-08-09 MED ORDER — VITAMIN D 1000 UNITS PO TABS
1000.0000 [IU] | ORAL_TABLET | Freq: Every day | ORAL | Status: DC
  Administered 2015-08-09 – 2015-08-12 (×4): 1000 [IU] via ORAL
  Filled 2015-08-09 (×4): qty 1

## 2015-08-09 MED ORDER — BRIMONIDINE TARTRATE-TIMOLOL 0.2-0.5 % OP SOLN: 1.0000 [drp] | Freq: Two times a day (BID) | OPHTHALMIC | Status: DC

## 2015-08-09 MED ORDER — TRAMADOL HCL 50 MG PO TABS
50.0000 mg | ORAL_TABLET | Freq: Three times a day (TID) | ORAL | Status: DC | PRN
  Administered 2015-08-09 (×2): 50 mg via ORAL
  Filled 2015-08-09 (×2): qty 1

## 2015-08-09 MED ORDER — ALLOPURINOL 100 MG PO TABS
100.0000 mg | ORAL_TABLET | Freq: Two times a day (BID) | ORAL | Status: DC
  Administered 2015-08-09 – 2015-08-12 (×8): 100 mg via ORAL
  Filled 2015-08-09 (×9): qty 1

## 2015-08-09 MED ORDER — POLYVINYL ALCOHOL 1.4 % OP SOLN
1.0000 [drp] | Freq: Three times a day (TID) | OPHTHALMIC | Status: DC
  Administered 2015-08-09 – 2015-08-12 (×14): 1 [drp] via OPHTHALMIC

## 2015-08-09 MED ORDER — LACTULOSE 10 GM/15ML PO SOLN
20.0000 g | Freq: Two times a day (BID) | ORAL | Status: DC
  Administered 2015-08-09 – 2015-08-12 (×8): 20 g via ORAL
  Filled 2015-08-09 (×10): qty 30

## 2015-08-09 MED ORDER — SODIUM CHLORIDE 0.9 % IJ SOLN
3.0000 mL | Freq: Two times a day (BID) | INTRAMUSCULAR | Status: DC
Start: 2015-08-09 — End: 2015-08-12
  Administered 2015-08-09 – 2015-08-12 (×7): 3 mL via INTRAVENOUS

## 2015-08-09 MED ORDER — PANTOPRAZOLE SODIUM 40 MG IV SOLR
40.0000 mg | Freq: Every day | INTRAVENOUS | Status: DC
  Administered 2015-08-09: 40 mg via INTRAVENOUS
  Filled 2015-08-09 (×2): qty 40

## 2015-08-09 MED ORDER — CHOLESTYRAMINE 4 G PO PACK
4.0000 g | PACK | Freq: Three times a day (TID) | ORAL | Status: DC
  Administered 2015-08-09 – 2015-08-12 (×10): 4 g via ORAL
  Filled 2015-08-09 (×13): qty 1

## 2015-08-09 MED ORDER — INSULIN GLARGINE 100 UNIT/ML ~~LOC~~ SOLN
50.0000 [IU] | Freq: Every day | SUBCUTANEOUS | Status: DC
  Filled 2015-08-09 (×2): qty 0.5

## 2015-08-09 MED ORDER — FERROUS SULFATE 325 (65 FE) MG PO TABS
325.0000 mg | ORAL_TABLET | Freq: Three times a day (TID) | ORAL | Status: DC
  Administered 2015-08-09 – 2015-08-12 (×11): 325 mg via ORAL
  Filled 2015-08-09 (×13): qty 1

## 2015-08-09 MED ORDER — COLCHICINE 0.6 MG PO TABS
0.6000 mg | ORAL_TABLET | Freq: Every day | ORAL | Status: DC | PRN
Start: 2015-08-09 — End: 2015-08-12
  Filled 2015-08-09: qty 1

## 2015-08-09 MED ORDER — POLYVINYL ALCOHOL 1.4 % OP SOLN
1.0000 [drp] | Freq: Three times a day (TID) | OPHTHALMIC | Status: DC
  Filled 2015-08-09: qty 15

## 2015-08-09 NOTE — Progress Notes (Signed)
Patient ID: Luke Corrente., male   DOB: Jun 19, 1946, 69 y.o.   MRN: [id number] Spoke with er doctor yesterday about 5pm in relation to the ct head findings on [name]. Small, sdh. Neuro stable.i advised about repeating ct head in 48 h. No contraindication for evaluation of gi bleeding. i got call stat few minutes ago about him. My advise is still the same. He is neuro normal. i will see him in am .

## 2015-08-09 NOTE — Progress Notes (Signed)
[place] TEAM 1 - Stepdown/ICU TEAM Progress Note  Luke PALAZZI Sr. VOZ:[id number] DOB: December 16, 1946 DOA: 08/08/2015 PCP: Adrian Prows, MD  Admit HPI / Brief Narrative: 69 y.o. male with Hx of multiple GI bleeds, hyperlipidemia, diabetes mellitus, COPD, gout, depression, NASH related cirrhosis, OSA not on CPAP, RLS, nearly blind, who presented to the [facility] ED with rectal bleeding.  While undergoing w/u there he fell an struck his head, leading to a small SDH as well as a scalp laceration.  Due to the lack of NS coverage at [facility], he was transferred to [facility] for admission.    Pt had colonoscopy7/11/2012 which showed 5 small polyps. He had an EGD 07/01/2012 that showed gastritis. He had a normal capsule endoscopy 03/28/2013. He had an EGD noting AVMs of the stomach 12/27/2014, and another EGD on 07/20/2015 revealing only mild gastritis w/ a benign gastric polyp.  HPI/Subjective: The patient is resting comfortably in a bedside chair.  He states he is very hungry and asking when he'll be allowed to eat.  He denies chest pain shortness breath fevers chills nausea or vomiting.  Assessment/Plan:  Acute on chronic blood loss anemia Hgb 6.8 at presentation - appears to be holding steady around 7.2 post ? 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 rate 19, height 5\' 4"  (1.626 m), weight 78.3 kg (172 lb 9.9 oz), SpO2 99 %.  Intake/Output Summary (Last 24 hours) at 08/09/15 1647 Last data filed at 08/09/15 0500  Gross per 24 hour  Intake      0 ml  Output    900 ml  Net   -900 ml    Exam: General: No acute respiratory distress Lungs: Clear to auscultation bilaterally without wheezes or crackles Cardiovascular: Regular rate and rhythm without murmur gallop or rub normal S1 and S2 Abdomen: Nontender, nondistended, soft, bowel sounds positive, no rebound, no ascites, no appreciable mass Extremities: No significant cyanosis, clubbing, or edema bilateral lower extremities  Data Reviewed: Basic Metabolic Panel:  Recent Labs Lab 08/08/15 1040 08/09/15 0613  NA 138 135  K 3.8 4.3  CL 110 105  CO2 25 24  GLUCOSE 65 87  BUN 11 8  CREATININE 0.63 0.71  CALCIUM 8.7* 8.6*    CBC:  Recent Labs Lab 08/08/15 1040 08/09/15 0127 08/09/15 0613 08/09/15 1305  WBC 3.3* 3.3* 3.0* 3.6*  HGB 6.8*  7.3* 7.2* 7.2*  HCT 20.5* 21.6* 21.9* 22.1*  MCV 94.3 92.3 93.2 93.6  PLT 52* 54* 51* 62*    Liver Function Tests:  Recent Labs Lab 08/08/15 1040 08/09/15 0613  AST 73* 65*  ALT 27 25  ALKPHOS 114 111  BILITOT 3.7* 4.7*  PROT 7.3 6.6  ALBUMIN 3.1* 2.6*    Coags:  Recent Labs Lab 08/09/15 0127  INR 1.79*    Recent Labs Lab 08/09/15 0127  APTT 45*    CBG:  Recent Labs Lab 08/08/15 2153 08/09/15 0142 08/09/15 0208 08/09/15 1210  GLUCAP 81 68 110* 102*    Recent Results (from the past 240  hour(s))  MRSA PCR Screening     Status: None   Collection Time: 08/08/15  9:26 PM  Result Value Ref Range Status   MRSA by PCR NEGATIVE NEGATIVE Final    Comment:        The GeneXpert MRSA Assay (FDA approved for NASAL specimens only), is one component of a comprehensive MRSA colonization surveillance program. It is not intended to diagnose MRSA infection nor to guide or monitor treatment for MRSA infections.      Studies:   Recent x-ray studies have been reviewed in detail by the Attending Physician  Scheduled Meds:  Scheduled Meds: . allopurinol  100 mg Oral BID  . brimonidine  1 drop Left Eye BID   And  . timolol  1 drop Left Eye BID  . cholecalciferol  1,000 Units Oral Daily  . cholestyramine  4 g Oral 3 times per day  . ferrous sulfate  325 mg Oral TID WC  . insulin aspart  0-9 Units Subcutaneous TID WC  . insulin glargine  50 Units Subcutaneous QHS  . lactulose  20 g Oral BID  . pantoprazole (PROTONIX) IV  40 mg Intravenous Daily  . polyvinyl alcohol  1 drop Left Eye TID AC & HS  . rifaximin  550 mg Oral BID  . sertraline  25 mg Oral Daily  . sodium chloride  3 mL Intravenous Q12H    Time spent on care of this patient: 35 mins   MCCLUNG,[name] , MD   [facility] Office  [phone] Pager - Text Page per Shea Evans as per below:  On-Call/Text Page:      [email or web address]      password [email or web address]  If 7PM-7AM, please contact night-coverage [email or web address] Password [email or web address] 08/09/2015, 4:47 PM   LOS: 1 day

## 2015-08-09 NOTE — Evaluation (Signed)
Physical Therapy Evaluation Patient Details Name: Luke FORGET Sr. MRN: [id number] DOB: 06/03/1946 Today's Date: 08/09/2015   History of Present Illness  Luke SOLEY Sr. is a 69 y.o. male with PMH of multiple GI bleeding, retention, hyperlipidemia, diabetes mellitus, COPD, gout, depression, NASh, cirrhosis, OSA not on CPAP, RLS, nearly blinded, who presents with rectal bleeding and fall with head injury.  Hgb dropped from in the 10.3 range to 6.8 in 3 days.  Clinical Impression  Pt admitted with/for gib and fall in ED.  Pt currently limited functionally due to the problems listed below.  (see problems list.)  Pt will benefit from PT to maximize function and safety to be able to get home safely with available assist of family.     Follow Up Recommendations Home health PT;Supervision for mobility/OOB    Equipment Recommendations  None recommended by PT    Recommendations for Other Services       Precautions / Restrictions Precautions Precautions: Fall      Mobility  Bed Mobility Overal bed mobility: Needs Assistance Bed Mobility: Rolling;Sidelying to Sit Rolling: Supervision Sidelying to sit: Supervision       General bed mobility comments: pushed up off of R UE  Transfers Overall transfer level: Needs assistance Equipment used:  (pushed iv pole) Transfers: Sit to/from Stand Sit to Stand: Min guard         General transfer comment: guard for safety  Ambulation/Gait Ambulation/Gait assistance: Min guard Ambulation Distance (Feet): 300 Feet Assistive device:  (iv pole) Gait Pattern/deviations: Step-through pattern     General Gait Details: occasional staggery and uncoordinated steps  Stairs            Wheelchair Mobility    Modified Rankin (Stroke Patients Only)       Balance Overall balance assessment: Needs assistance   Sitting balance-Leahy Scale: Fair     Standing balance support: During functional activity;No upper extremity  supported Standing balance-Leahy Scale: Fair                               Pertinent Vitals/Pain Pain Assessment: Faces Pain Score: 0-No pain    Home Living Family/patient expects to be discharged to:: Private residence Living Arrangements: Spouse/significant other Available Help at Discharge: Family;Available 24 hours/day Type of Home: House Home Access: Level entry;Ramped entrance     Home Layout: One level Home Equipment: Walker - 2 wheels;Cane - single point;Grab bars - toilet;Grab bars - tub/shower Additional Comments: Pt has hospital bed and RW at home but slept in chair in living room PTA    Prior Function Level of Independence: Independent with assistive device(s)               Hand Dominance   Dominant Hand: Right    Extremity/Trunk Assessment   Upper Extremity Assessment: Generalized weakness           Lower Extremity Assessment: Overall WFL for tasks assessed (mild proximal weakness)      Cervical / Trunk Assessment: Normal  Communication   Communication: No difficulties  Cognition Arousal/Alertness: Awake/alert Behavior During Therapy: WFL for tasks assessed/performed Overall Cognitive Status: Within Functional Limits for tasks assessed                      General Comments      Exercises        Assessment/Plan    PT Assessment Patient needs continued PT services  PT Diagnosis Difficulty walking;Generalized weakness   PT Problem List Decreased strength;Decreased activity tolerance;Decreased coordination;Decreased mobility  PT Treatment Interventions Gait training;DME instruction;Functional mobility training;Therapeutic activities;Patient/family education   PT Goals (Current goals can be found in the Care Plan section) Acute Rehab PT Goals Patient Stated Goal: feel safer when walking PT Goal Formulation: With patient Time For Goal Achievement: 08/16/15 Potential to Achieve Goals: Good    Frequency Min  3X/week   Barriers to discharge        Co-evaluation               End of Session   Activity Tolerance: Patient tolerated treatment well Patient left: in chair;with call bell/phone within reach Nurse Communication: Mobility status         Time: 1120-1145 PT Time Calculation (min) (ACUTE ONLY): 25 min   Charges:   PT Evaluation $Initial PT Evaluation Tier I: 1 Procedure PT Treatments $Gait Training: 8-22 mins   PT G Codes:        [name], [name] 08/09/2015, 12:08 PM 08/09/2015  Donnella [name], PT [phone]  (pager)

## 2015-08-10 ENCOUNTER — Inpatient Hospital Stay (HOSPITAL_COMMUNITY): Payer: Medicare Other

## 2015-08-10 DIAGNOSIS — E1165 Type 2 diabetes mellitus with hyperglycemia: Secondary | ICD-10-CM

## 2015-08-10 DIAGNOSIS — E11319 Type 2 diabetes mellitus with unspecified diabetic retinopathy without macular edema: Secondary | ICD-10-CM

## 2015-08-10 LAB — GLUCOSE, CAPILLARY
GLUCOSE-CAPILLARY: 154 mg/dL — AB (ref 65–99)
GLUCOSE-CAPILLARY: 209 mg/dL — AB (ref 65–99)
GLUCOSE-CAPILLARY: 153 mg/dL — AB (ref 65–99)
Glucose-Capillary: 198 mg/dL — ABNORMAL HIGH (ref 65–99)

## 2015-08-10 LAB — COMPREHENSIVE METABOLIC PANEL
ALBUMIN: 2.5 g/dL — AB (ref 3.5–5.0)
ALK PHOS: 110 U/L (ref 38–126)
ALT: 24 U/L (ref 17–63)
AST: 68 U/L — AB (ref 15–41)
Anion gap: 6 (ref 5–15)
BILIRUBIN TOTAL: 4.7 mg/dL — AB (ref 0.3–1.2)
BUN: 10 mg/dL (ref 6–20)
CO2: 21 mmol/L — ABNORMAL LOW (ref 22–32)
Calcium: 8.2 mg/dL — ABNORMAL LOW (ref 8.9–10.3)
Chloride: 106 mmol/L (ref 101–111)
Creatinine, Ser: 0.74 mg/dL (ref 0.61–1.24)
GFR calc Af Amer: >60 mL/min (ref >60)
GFR calc non Af Amer: >60 mL/min (ref >60)
GLUCOSE: 152 mg/dL — AB (ref 65–99)
POTASSIUM: 4.1 mmol/L (ref 3.5–5.1)
Sodium: 133 mmol/L — ABNORMAL LOW (ref 135–145)
TOTAL PROTEIN: 6.9 g/dL (ref 6.5–8.1)

## 2015-08-10 LAB — CBC
HEMATOCRIT: 22 % — AB (ref 39.0–52.0)
HEMOGLOBIN: 7.2 g/dL — AB (ref 13.0–17.0)
MCH: 31 pg (ref 26.0–34.0)
MCHC: 32.7 g/dL (ref 30.0–36.0)
MCV: 94.8 fL (ref 78.0–100.0)
Platelets: 63 10*3/uL — ABNORMAL LOW (ref 150–400)
RBC: 2.32 MIL/uL — ABNORMAL LOW (ref 4.22–5.81)
RDW: 20.3 % — ABNORMAL HIGH (ref 11.5–15.5)
WBC: 3 10*3/uL — ABNORMAL LOW (ref 4.0–10.5)

## 2015-08-10 LAB — AMMONIA: Ammonia: 110 umol/L — ABNORMAL HIGH (ref 9–35)

## 2015-08-10 LAB — HAPTOGLOBIN: HAPTOGLOBIN: 65 mg/dL (ref 34–200)

## 2015-08-10 MED ORDER — INSULIN GLARGINE 100 UNIT/ML ~~LOC~~ SOLN
10.0000 [IU] | Freq: Every day | SUBCUTANEOUS | Status: DC
  Administered 2015-08-10: 10 [IU] via SUBCUTANEOUS
  Filled 2015-08-10 (×2): qty 0.1

## 2015-08-10 MED ORDER — ONDANSETRON HCL 4 MG/2ML IJ SOLN: 4.0000 mg | Freq: Four times a day (QID) | INTRAMUSCULAR | Status: DC | PRN

## 2015-08-10 MED ORDER — MUPIROCIN 2 % EX OINT
1.0000 "application " | TOPICAL_OINTMENT | Freq: Two times a day (BID) | CUTANEOUS | Status: DC
  Administered 2015-08-10 – 2015-08-12 (×5): 1 via NASAL
  Filled 2015-08-10: qty 22

## 2015-08-10 MED ORDER — CHLORHEXIDINE GLUCONATE CLOTH 2 % EX PADS
6.0000 | MEDICATED_PAD | Freq: Every day | CUTANEOUS | Status: DC
  Administered 2015-08-10 – 2015-08-12 (×3): 6 via TOPICAL

## 2015-08-10 NOTE — Progress Notes (Signed)
[place] TEAM 1 - Stepdown/ICU TEAM Progress Note  NILS THOR Sr. YOV:[id number] DOB: [birth year] DOA: 08/08/2015 PCP: Adrian Prows, MD  Admit HPI / Brief Narrative: 69 y.o. male with Hx of multiple GI bleeds, hyperlipidemia, diabetes mellitus, COPD, gout, depression, NASH related cirrhosis, OSA not on CPAP, RLS, nearly blind, who presented to the [facility] ED with rectal bleeding.  While undergoing w/u there he fell and struck his head, leading to a small SDH as well as a scalp laceration.  Due to the lack of NS coverage at [facility], he was transferred to [facility] for admission.    Pt had colonoscopy7/11/2012 which showed 5 small polyps. He had an EGD 07/01/2012 that showed gastritis. He had a normal capsule endoscopy 03/28/2013. He had an EGD noting AVMs of the stomach 12/27/2014, and another EGD on 07/20/2015 revealing only mild gastritis w/ a benign gastric polyp.  HPI/Subjective: The patient is resting comfortably in a bedside chair.  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  He is quite hungry.  He denies chest pain shortness breath fevers chills nausea vomiting lightheadedness or dizziness.  Assessment/Plan:  Acute on chronic blood loss anemia Hgb 6.8 at presentation - appears to be holding steady around 7.2 post ? 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 - cont to dose with vitamin K and follow to  determine if this improves  Small subdural hemorrhage due to fall in ED Clinically the patient appears to be stable - follow-up CT scan head this morning reveals stability/improvement   Pancytopenia Due to cirrhosis  Cirrhosis due to NASH  HTN Blood pressure currently controlled  COPD Quiescent  DM CBG climbing - adjust tx w/ advancement of diet   Gout  OSA  Legally blind  Code Status: FULL Family Communication: no family present at time of exam Disposition Plan: SDU  Consultants: NS   Procedures: none  Antibiotics: none  DVT prophylaxis: SCDs  Objective: Blood pressure 122/61, pulse 70, temperature 98 F (36.7 C), temperature source Oral, resp. rate 15, height 5\' 4"  (1.626 m), weight 79.7 kg (175 lb 11.3 oz), SpO2 100 %.  Intake/Output Summary (Last 24 hours) at 08/10/15 1700 Last data filed at 08/10/15 1500  Gross per 24 hour  Intake 913.33 ml  Output   2201 ml  Net -1287.67 ml    Exam: General: No acute respiratory distress - alert and conversant  Lungs: Clear to auscultation bilaterally without wheezes / crackles Cardiovascular: Regular rate and rhythm without murmur gallop or rub  Abdomen: Nontender, modestly protuberent, soft, bowel sounds positive, no rebound, no ascites, no appreciable mass Extremities: No significant cyanosis, clubbing, edema bilateral lower extremities  Data Reviewed: Basic Metabolic Panel:  Recent Labs Lab 08/08/15 1040 08/09/15 0613 08/10/15 0224  NA 138 135 133*  K 3.8 4.3 4.1  CL 110 105 106  CO2 25 24 21*  GLUCOSE 65 87 152*  BUN 11 8 10   CREATININE 0.63 0.71 0.74  CALCIUM 8.7* 8.6* 8.2*    CBC:  Recent Labs Lab 08/08/15 1040 08/09/15 0127 08/09/15 0613 08/09/15 1305 08/10/15 0224  WBC 3.3* 3.3* 3.0* 3.6* 3.0*  HGB 6.8* 7.3* 7.2* 7.2* 7.2*  HCT 20.5* 21.6* 21.9* 22.1* 22.0*  MCV 94.3 92.3 93.2 93.6 94.8  PLT 52* 54* 51* 62* 63*    Liver Function Tests:  Recent Labs Lab 08/08/15 1040  08/09/15 0613 08/10/15 0224  AST 73* 65* 68*  ALT 27 25 24   ALKPHOS 114 111 110  BILITOT 3.7* 4.7* 4.7*  PROT 7.3 6.6 6.9  ALBUMIN 3.1* 2.6* 2.5*    Coags:  Recent Labs Lab 08/09/15 0127  INR 1.79*    Recent Labs Lab 08/09/15 0127  APTT 45*    CBG:  Recent Labs Lab 08/09/15 1210 08/09/15 1556 08/09/15 2154 08/10/15 0824 08/10/15 1155  GLUCAP 102* 93 163* 154* 198*    Recent Results (from the past 240 hour(s))  MRSA PCR Screening     Status: None   Collection Time: 08/08/15  9:26 PM  Result Value Ref Range Status   MRSA by PCR NEGATIVE NEGATIVE Final    Comment:        The GeneXpert MRSA Assay (FDA approved for NASAL specimens only), is one component of a comprehensive MRSA colonization surveillance program. It is not intended to diagnose MRSA infection nor to guide or monitor treatment for MRSA infections.      Studies:   Recent x-ray studies have been reviewed in detail by the Attending Physician  Scheduled Meds:  Scheduled Meds: . allopurinol  100 mg Oral BID  . brimonidine  1 drop Left Eye BID   And  . timolol  1 drop Left Eye BID  . Chlorhexidine Gluconate Cloth  6 each Topical Q0600  . cholecalciferol  1,000 Units Oral Daily  . cholestyramine  4 g Oral 3 times per day  . ferrous sulfate  325 mg Oral TID WC  . insulin aspart  0-9 Units Subcutaneous TID WC  . lactulose  20 g Oral BID  . mupirocin ointment  1 application Nasal BID  . pantoprazole  40 mg Oral Q1200  . phytonadione  10 mg Oral q1800  . polyvinyl alcohol  1 drop Left Eye TID AC & HS  . rifaximin  550 mg Oral BID  . sertraline  25 mg Oral Daily  . sodium chloride  3 mL Intravenous Q12H    Time spent on care of this patient: 35 mins   Marlow Berenguer T , MD   [facility] Office  [phone] Pager - Text Page per Shea Evans as per below:  On-Call/Text Page:      [email or web address]      password [email or web address]  If 7PM-7AM, please contact night-coverage [email or web address] Password  [email or web address] 08/10/2015, 5:00 PM   LOS: 2 days

## 2015-08-11 DIAGNOSIS — D509 Iron deficiency anemia, unspecified: Secondary | ICD-10-CM

## 2015-08-11 LAB — COMPREHENSIVE METABOLIC PANEL
ALK PHOS: 109 U/L (ref 38–126)
ALT: 27 U/L (ref 17–63)
AST: 68 U/L — AB (ref 15–41)
Albumin: 2.7 g/dL — ABNORMAL LOW (ref 3.5–5.0)
Anion gap: 7 (ref 5–15)
BILIRUBIN TOTAL: 5.8 mg/dL — AB (ref 0.3–1.2)
BUN: 9 mg/dL (ref 6–20)
CALCIUM: 8.8 mg/dL — AB (ref 8.9–10.3)
CHLORIDE: 101 mmol/L (ref 101–111)
CO2: 24 mmol/L (ref 22–32)
CREATININE: 0.72 mg/dL (ref 0.61–1.24)
Glucose, Bld: 150 mg/dL — ABNORMAL HIGH (ref 65–99)
Potassium: 3.9 mmol/L (ref 3.5–5.1)
Sodium: 132 mmol/L — ABNORMAL LOW (ref 135–145)
TOTAL PROTEIN: 7.1 g/dL (ref 6.5–8.1)

## 2015-08-11 LAB — CBC
HCT: 23.7 % — ABNORMAL LOW (ref 39.0–52.0)
Hemoglobin: 7.8 g/dL — ABNORMAL LOW (ref 13.0–17.0)
MCH: 31.3 pg (ref 26.0–34.0)
MCHC: 32.9 g/dL (ref 30.0–36.0)
MCV: 95.2 fL (ref 78.0–100.0)
PLATELETS: 58 10*3/uL — AB (ref 150–400)
RBC: 2.49 MIL/uL — AB (ref 4.22–5.81)
RDW: 19.4 % — AB (ref 11.5–15.5)
WBC: 3.7 10*3/uL — AB (ref 4.0–10.5)

## 2015-08-11 LAB — PROTIME-INR
INR: 1.82 — ABNORMAL HIGH (ref 0.00–1.49)
Prothrombin Time: 21 seconds — ABNORMAL HIGH (ref 11.6–15.2)

## 2015-08-11 LAB — GLUCOSE, CAPILLARY
GLUCOSE-CAPILLARY: 157 mg/dL — AB (ref 65–99)
GLUCOSE-CAPILLARY: 207 mg/dL — AB (ref 65–99)
GLUCOSE-CAPILLARY: 204 mg/dL — AB (ref 65–99)
Glucose-Capillary: 194 mg/dL — ABNORMAL HIGH (ref 65–99)

## 2015-08-11 LAB — APTT: APTT: 43 s — AB (ref 24–37)

## 2015-08-11 LAB — PREPARE RBC (CROSSMATCH)

## 2015-08-11 MED ORDER — FUROSEMIDE 40 MG PO TABS
40.0000 mg | ORAL_TABLET | Freq: Every day | ORAL | Status: DC
  Administered 2015-08-11 – 2015-08-12 (×2): 40 mg via ORAL
  Filled 2015-08-11 (×2): qty 1

## 2015-08-11 MED ORDER — INSULIN GLARGINE 100 UNIT/ML ~~LOC~~ SOLN
18.0000 [IU] | Freq: Every day | SUBCUTANEOUS | Status: DC
  Administered 2015-08-11: 18 [IU] via SUBCUTANEOUS
  Filled 2015-08-11 (×2): qty 0.18

## 2015-08-11 MED ORDER — SODIUM CHLORIDE 0.9 % IV SOLN
Freq: Once | INTRAVENOUS | Status: AC
  Administered 2015-08-11: 10 mL/h via INTRAVENOUS

## 2015-08-11 MED ORDER — SODIUM CHLORIDE 0.9 % IV SOLN
510.0000 mg | Freq: Once | INTRAVENOUS | Status: AC
  Administered 2015-08-11: 510 mg via INTRAVENOUS
  Filled 2015-08-11: qty 17

## 2015-08-11 MED ORDER — SPIRONOLACTONE 25 MG PO TABS
25.0000 mg | ORAL_TABLET | Freq: Every day | ORAL | Status: DC
  Administered 2015-08-11 – 2015-08-12 (×2): 25 mg via ORAL
  Filled 2015-08-11 (×2): qty 1

## 2015-08-11 NOTE — Progress Notes (Signed)
Utilization Review Completed.Leeya Rusconi T8/21/2016  

## 2015-08-11 NOTE — Progress Notes (Signed)
[place] TEAM 1 - Stepdown/ICU TEAM Progress Note  [name] Sr. DJS:[id number] DOB: 08-04-1946 DOA: 08/08/2015 PCP: Adrian Prows, MD  Admit HPI / Brief Narrative: 69 y.o. male with Hx of multiple GI bleeds, hyperlipidemia, diabetes mellitus, COPD, gout, depression, NASH related cirrhosis, OSA not on CPAP, RLS, nearly blind, who presented to the [facility] ED with rectal bleeding.  While undergoing w/u there he fell and struck his head, leading to a small SDH as well as a scalp laceration.  Due to the lack of NS coverage at [facility], he was transferred to [facility] for admission.    Pt had colonoscopy7/11/2012 which showed 5 small polyps. He had an EGD 07/01/2012 that showed gastritis. He had a normal capsule endoscopy 03/28/2013. He had an EGD noting AVMs of the stomach 12/27/2014, and another EGD on 07/20/2015 revealing only mild gastritis w/ a benign gastric polyp.  HPI/Subjective: The patient had some dark stools yesterday, but it should be noted he is on iron therapy.  There has been no bright red blood per rectum.  He denies chest pain shortness breath fevers chills or abdominal pain.  He feels very good and wishes to be discharged home.  He is tolerating his diet without any difficulty whatsoever.  Assessment/Plan:  Acute on chronic blood loss anemia v/s low production  Hgb 6.8 at presentation - s/p ? 2U PRBC at [facility] - Hgb is slowly climbing - labs not c/w hemolysis - I suspect decreased production with possible low-grade intermittent chronic GI blood loss related to coagulopathy - I will transfuse with 1 unit of packed red blood cells today for "insurance" in this patient who likely does not produce PRBC at a sufficient rate  GIB Normal BUN of 11 at admit suggests not UGI source - hx reveals reports of red stool - had EGD 07/20/15 noting only mild gastritis - continue to follow now as patient appears clinically stable - I do not feel that repeat EGD at this time would likely add much to his  care - should the bleeding declare itself more clearly we may have to reconsider - still no evidence of GI source presently   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  Consultants: NS   Procedures: none  Antibiotics: none  DVT prophylaxis: SCDs  Objective: Blood pressure 125/68, pulse 67, temperature 97.5 F (36.4 C), temperature source Oral, resp. rate 15, height 5\' 4"  (1.626 m), weight 79.7 kg (175 lb 11.3 oz), SpO2 100 %.  Intake/Output Summary (Last 24 hours) at 08/11/15 0856 Last data filed at 08/11/15 8850  Gross per 24 hour  Intake   1210 ml  Output   1651 ml  Net   -441 ml   Exam: General: No acute respiratory distress - alert and conversant - pleasant  Lungs: Clear to auscultation bilaterally without crackles or wheeze  Cardiovascular: Regular rate and rhythm without murmur gallop rub  Abdomen: Nontender, modestly protuberent, soft, bowel sounds positive, no rebound, no ascites, no  appreciable mass Extremities: No significant cyanosis, clubbing, or edema bilateral lower extremities  Data Reviewed: Basic Metabolic Panel:  Recent Labs Lab 08/08/15 1040 08/09/15 0613 08/10/15 0224 08/11/15 0253  NA 138 135 133* 132*  K 3.8 4.3 4.1 3.9  CL 110 105 106 101  CO2 25 24 21* 24  GLUCOSE 65 87 152* 150*  BUN 11 8 10 9   CREATININE 0.63 0.71 0.74 0.72  CALCIUM 8.7* 8.6* 8.2* 8.8*    CBC:  Recent Labs Lab 08/09/15 0127 08/09/15 0613  08/09/15 1305 08/10/15 0224 08/11/15 0253  WBC 3.3* 3.0* 3.6* 3.0* 3.7*  HGB 7.3* 7.2* 7.2* 7.2* 7.8*  HCT 21.6* 21.9* 22.1* 22.0* 23.7*  MCV 92.3 93.2 93.6 94.8 95.2  PLT 54* 51* 62* 63* 58*    Liver Function Tests:  Recent Labs Lab 08/08/15 1040 08/09/15 0613 08/10/15 0224 08/11/15 0253  AST 73* 65* 68* 68*  ALT 27 25 24 27   ALKPHOS 114 111 110 109  BILITOT 3.7* 4.7* 4.7* 5.8*  PROT 7.3 6.6 6.9 7.1  ALBUMIN 3.1* 2.6* 2.5* 2.7*    Coags:  Recent Labs Lab 08/09/15 0127 08/11/15 0253  INR 1.79* 1.82*    Recent Labs Lab 08/09/15 0127 08/11/15 0253  APTT 45* 43*    CBG:  Recent Labs Lab 08/10/15 0824 08/10/15 1155 08/10/15 1642 08/10/15 2143 08/11/15 0756  GLUCAP 154* 198* 209* 153* 157*    Recent Results (from the past 240 hour(s))  MRSA PCR Screening     Status: None   Collection Time: 08/08/15  9:26 PM  Result Value Ref Range Status   MRSA by PCR NEGATIVE NEGATIVE Final    Comment:        The GeneXpert MRSA Assay (FDA approved for NASAL specimens only), is one component of a comprehensive MRSA colonization surveillance program. It is not intended to diagnose MRSA infection nor to guide or monitor treatment for MRSA infections.      Studies:   Recent x-ray studies have been reviewed in detail by the Attending Physician  Scheduled Meds:  Scheduled Meds: . allopurinol  100 mg Oral BID  . brimonidine  1 drop Left Eye BID   And  . timolol  1 drop Left Eye BID  . Chlorhexidine Gluconate Cloth  6 each Topical Q0600  . cholecalciferol  1,000 Units Oral Daily  . cholestyramine  4 g Oral 3 times per day  . ferrous sulfate  325 mg Oral TID WC  . insulin aspart  0-9 Units Subcutaneous TID WC  . insulin glargine  10 Units Subcutaneous QHS  . lactulose  20 g Oral BID  . mupirocin ointment  1 application Nasal BID  . pantoprazole  40 mg Oral Q1200  . phytonadione  10 mg Oral q1800  . polyvinyl alcohol  1 drop Left Eye TID AC & HS  .  rifaximin  550 mg Oral BID  . sertraline  25 mg Oral Daily  . sodium chloride  3 mL Intravenous Q12H    Time spent on care of this patient: 35 mins   Kendre Sires T , MD   [facility] Office  [phone] Pager - Text Page per Shea Evans as per below:  On-Call/Text Page:      [email or web address]      password [email or web address]  If 7PM-7AM, please contact night-coverage [email or web address] Password [email or web address] 08/11/2015, 8:56 AM   LOS: 3 days

## 2015-08-11 NOTE — Evaluation (Signed)
Occupational Therapy Evaluation and Discharge Patient Details Name: Luke PHILIPSON Sr. MRN: [id number] DOB: 06/25/1946 Today's Date: 08/11/2015    History of Present Illness Luke LASHLEY Sr. is a 69 y.o. male with PMH of multiple GI bleeding, retention, hyperlipidemia, diabetes mellitus, COPD, gout, depression, NASh, cirrhosis, OSA not on CPAP, RLS, nearly blinded, who presents with rectal bleeding and fall with head injury.  Hgb dropped from in the 10.3 range to 6.8 in 3 days.   Clinical Impression   This 69 yo male admitted with above presents to acute OT at an overall minguard A level and will have this at home. D/C from acute OT.    Follow Up Recommendations  No OT follow up    Equipment Recommendations  None recommended by OT       Precautions / Restrictions Precautions Precautions: Fall Restrictions Weight Bearing Restrictions: No      Mobility Bed Mobility               General bed mobility comments: Pt up in recliner upon my arrival  Transfers Overall transfer level: Needs assistance Equipment used: 1 person hand held assist Transfers: Sit to/from Stand Sit to Stand: Minguard A         General transfer comment: had pt ambulate with me around 1/3 of unit with one person HHA due to not having a SPC with me         ADL                                         General ADL Comments: Reports that when he does wear socks, that wife helps him doff and donn them. He does all of his other dressing and his shoes are velcro. Pt curently at an overall minguard A level when up on his feet other than this.     Vision Additional Comments: Legally blind          Pertinent Vitals/Pain Pain Assessment: No/denies pain     Hand Dominance Right   Extremity/Trunk Assessment Upper Extremity Assessment Upper Extremity Assessment: Overall WFL for tasks assessed (minimal decreased externa rotation at shoulders for being able to put hands  behind neck)           Communication Communication Communication: No difficulties   Cognition Arousal/Alertness: Awake/alert Behavior During Therapy: WFL for tasks assessed/performed Overall Cognitive Status: Within Functional Limits for tasks assessed                                Home Living Family/patient expects to be discharged to:: Private residence Living Arrangements: Spouse/significant other Available Help at Discharge: Family;Available 24 hours/day (wife is deaf (reads lips and sign language)) Type of Home: House Home Access: Level entry;Ramped entrance     Home Layout: One level     Bathroom Shower/Tub: Curtain;Walk-in shower Shower/tub characteristics: Curtain Biochemist, clinical: Handicapped height Bathroom Accessibility: Yes   Home Equipment: Environmental consultant - 2 wheels;Cane - single point;Grab bars - toilet;Grab bars - tub/shower   Additional Comments: Pt has hospital bed and RW at home but slept in chair in living room PTA      Prior Functioning/Environment Level of Independence: Independent with assistive device(s)             OT Diagnosis: Generalized weakness  OT Goals(Current goals can be found in the care plan section) Acute Rehab OT Goals Patient Stated Goal: home tomorrow  OT Frequency:                End of Session    Activity Tolerance: Patient tolerated treatment well Patient left: in chair;with call bell/phone within reach;with family/visitor present   Time: 6219-4712 OT Time Calculation (min): 19 min Charges:  OT General Charges $OT Visit: 1 Procedure OT Evaluation $Initial OT Evaluation Tier I: 1 Procedure  Almon Register [phone] 08/11/2015, 10:01 AM

## 2015-08-12 DIAGNOSIS — K2971 Gastritis, unspecified, with bleeding: Secondary | ICD-10-CM

## 2015-08-12 LAB — TYPE AND SCREEN
ABO/RH(D): AB POS
ANTIBODY SCREEN: NEGATIVE
UNIT DIVISION: 0

## 2015-08-12 LAB — PROTIME-INR
INR: 1.66 — AB (ref 0.00–1.49)
PROTHROMBIN TIME: 19.6 s — AB (ref 11.6–15.2)

## 2015-08-12 LAB — COMPREHENSIVE METABOLIC PANEL
ALT: 29 U/L (ref 17–63)
AST: 67 U/L — AB (ref 15–41)
Albumin: 2.8 g/dL — ABNORMAL LOW (ref 3.5–5.0)
Alkaline Phosphatase: 125 U/L (ref 38–126)
Anion gap: 8 (ref 5–15)
BUN: 8 mg/dL (ref 6–20)
CHLORIDE: 102 mmol/L (ref 101–111)
CO2: 25 mmol/L (ref 22–32)
CREATININE: 0.79 mg/dL (ref 0.61–1.24)
Calcium: 9.1 mg/dL (ref 8.9–10.3)
Glucose, Bld: 173 mg/dL — ABNORMAL HIGH (ref 65–99)
POTASSIUM: 4.2 mmol/L (ref 3.5–5.1)
SODIUM: 135 mmol/L (ref 135–145)
Total Bilirubin: 7 mg/dL — ABNORMAL HIGH (ref 0.3–1.2)
Total Protein: 7.5 g/dL (ref 6.5–8.1)

## 2015-08-12 LAB — CBC
HEMATOCRIT: 27.7 % — AB (ref 39.0–52.0)
HEMOGLOBIN: 9.1 g/dL — AB (ref 13.0–17.0)
MCH: 30.8 pg (ref 26.0–34.0)
MCHC: 32.9 g/dL (ref 30.0–36.0)
MCV: 93.9 fL (ref 78.0–100.0)
Platelets: 56 10*3/uL — ABNORMAL LOW (ref 150–400)
RBC: 2.95 MIL/uL — AB (ref 4.22–5.81)
RDW: 19.5 % — ABNORMAL HIGH (ref 11.5–15.5)
WBC: 3.7 10*3/uL — ABNORMAL LOW (ref 4.0–10.5)

## 2015-08-12 LAB — GLUCOSE, CAPILLARY
GLUCOSE-CAPILLARY: 197 mg/dL — AB (ref 65–99)
Glucose-Capillary: 249 mg/dL — ABNORMAL HIGH (ref 65–99)

## 2015-08-12 MED ORDER — INSULIN GLARGINE 100 UNIT/ML ~~LOC~~ SOLN: 70.0000 [IU] | Freq: Every day | SUBCUTANEOUS | Status: DC

## 2015-08-12 NOTE — Consult Note (Signed)
NAME:  KELLAR, WESTBERG NO.:  [id number]  MEDICAL RECORD NO.:  [id number]  LOCATION:  3S13C                        FACILITY:  [facility]  PHYSICIAN:  Leeroy Cha, M.D.   DATE OF BIRTH:  1946/09/01  DATE OF CONSULTATION:  08/09/2015 DATE OF DISCHARGE:                                [place] COURSE:  [name] is a gentleman who was taken to [facility], [facility], yesterday because he fell after he fainted and hit his head.  CT scan of the head shows a small frontal subdural hematoma with no shift.  I was called yesterday about 5:00 p.m. about what to do next.  I talked to the doctor in the emergency room.  I mentioned that the CT scan was essentially negative except for small subdural hematoma.  The main concern is that this patient has a GI bleeding and probably that is the result that he fainted.  Nevertheless, I was called about 3 o'clock in the morning today stat for the subdural hematoma.  I talked to the doctor on-call.  I mentioned that already I knew make this to work.  Today, he is clinically stable, awake, and talking.  I was able to talk to his family.  He has some bruise in the scalp.  Mentally, he is oriented x3.  He has no weakness in the upper or lower extremities.  Reflexes are normal.  He is due to have a colonoscopy today for the lower GI bleeding.  I mentioned to the family that the CT scan showed a tiny subdural hematoma which is with no neurological concerns; but since he is being admitted, I will advise to get a CT scan on Sunday or Monday or before as needed.  There is no contraindication for him to have a colonoscopy.          ______________________________ Leeroy Cha, M.D.     EB/MEDQ  D:  08/09/2015  T:  08/09/2015  Job:  [id number]

## 2015-08-12 NOTE — Progress Notes (Signed)
Physical Therapy Treatment Patient Details Name: Luke BENTSON Sr. MRN: [id number] DOB: 1946/07/31 Today's Date: 08/12/2015    History of Present Illness Luke APOLINAR Sr. is a 69 y.o. male with PMH of multiple GI bleeding, retention, hyperlipidemia, diabetes mellitus, COPD, gout, depression, NASh, cirrhosis, OSA not on CPAP, RLS, nearly blinded, who presents with rectal bleeding and fall with head injury.  Hgb dropped from in the 10.3 range to 6.8 in 3 days.    PT Comments    Patient mobilizing well during session with use of RW and ocasional cues for safety with mobility.  Ambulated increased distance and tolerated well. Continue to recommend HHPT for safety assessment and fall prevention.   Follow Up Recommendations  Home health PT;Supervision for mobility/OOB     Equipment Recommendations  None recommended by PT    Recommendations for Other Services       Precautions / Restrictions Precautions Precautions: Fall Restrictions Weight Bearing Restrictions: No    Mobility  Bed Mobility Overal bed mobility: Needs Assistance Bed Mobility: Rolling;Sidelying to Sit Rolling: Supervision Sidelying to sit: Supervision       General bed mobility comments: No physical assist required, supervision for recognition of lines  Transfers Overall transfer level: Needs assistance Equipment used: Rolling walker (2 wheeled) Transfers: Sit to/from Stand Sit to Stand: Supervision         General transfer comment: Vcs for hand placement  Ambulation/Gait Ambulation/Gait assistance: Supervision Ambulation Distance (Feet): 740 Feet Assistive device: Rolling walker (2 wheeled) (iv pole) Gait Pattern/deviations: Step-through pattern;Decreased stride length;Drifts right/left Gait velocity: decreased Gait velocity interpretation: Below normal speed for age/gender General Gait Details: VCs for upright posture and cadence. Improvements in stability noted with use of RW   Stairs             Wheelchair Mobility    Modified Rankin (Stroke Patients Only)       Balance     Sitting balance-Leahy Scale: Fair     Standing balance support: During functional activity Standing balance-Leahy Scale: Fair                      Cognition Arousal/Alertness: Awake/alert Behavior During Therapy: WFL for tasks assessed/performed Overall Cognitive Status: Within Functional Limits for tasks assessed                      Exercises      General Comments        Pertinent Vitals/Pain Pain Assessment: No/denies pain    Home Living                      Prior Function            PT Goals (current goals can now be found in the care plan section) Acute Rehab PT Goals Patient Stated Goal: home tomorrow PT Goal Formulation: With patient Time For Goal Achievement: 08/16/15 Potential to Achieve Goals: Good Progress towards PT goals: Progressing toward goals    Frequency  Min 3X/week    PT Plan Current plan remains appropriate    Co-evaluation             End of Session Equipment Utilized During Treatment: Gait belt Activity Tolerance: Patient tolerated treatment well Patient left: in chair;with call bell/phone within reach     Time: 9892-1194 PT Time Calculation (min) (ACUTE ONLY): 19 min  Charges:  $Gait Training: 8-22 mins  G CodesDuncan [name] 2015/08/22, 4:32 PM [name], [place] DPT  [phone]

## 2015-08-12 NOTE — Discharge Summary (Signed)
DISCHARGE SUMMARY  [name]  MR#: [id number]  DOB:[birth year]  Date of Admission: 08/08/2015 Date of Discharge: 08/12/2015  Attending Physician:Josedejesus Marcum T  Patient's EXH:[id number], [name]  Consults:  Dr. Joya Salm - Neurosurgery   Disposition: D/C home   Follow-up Appts:     Follow-up Information    Follow up with [facility], [name] In 1 week.   Specialty:  Infectious Diseases   Contact information:   [place] [address] [phone]      Tests Needing Follow-up: -recheck of CBC is suggested in 5-7 days  -sutures will need to be removed from his R frontal scalp laceration in 3-4 days time   Discharge Diagnoses: Acute on chronic blood loss anemia v/s low production  GIB Coagulopathy Small subdural hemorrhage due to fall in ED Pancytopenia Cirrhosis due to NASH HTN COPD DM Gout OSA Legally blind  Initial presentation: 69 y.o. male with Hx of multiple GI bleeds, hyperlipidemia, diabetes mellitus, COPD, gout, depression, NASH related cirrhosis, OSA not on CPAP, RLS, nearly blind, who presented to the [facility] ED with rectal bleeding. While undergoing w/u there he fell and struck his head, leading to a small SDH as well as a scalp laceration. Due to the lack of NS coverage at [facility], he was transferred to [facility] for admission.   Pt had colonoscopy7/11/2012 which showed 5 small polyps. He had an EGD 07/01/2012 that showed gastritis. He had a normal capsule endoscopy 03/28/2013. He had an EGD noting AVMs of the stomach 12/27/2014, and another EGD on 07/20/2015 revealing only mild gastritis w/ a benign gastric polyp.  Hospital Course:  Acute on chronic blood loss anemia v/s low production  Hgb 6.8 at presentation - s/p ? 2U PRBC at [facility] - Hgb slowly climbed th/o the hospital stay - labs not c/w hemolysis - I suspect decreased production with possible low-grade intermittent chronic GI blood loss related to coagulopathy - I  transfused with 1 unit of packed red blood cells 8/21 for "insurance" in this patient who likely does not produce PRBC at a sufficient rate - recheck of CBC is suggested in 5-7 days   GIB Normal BUN of 11 at admit suggests not UGI source - hx revealed report of intermittent red stools - had EGD 07/20/15 noting only mild gastritis - continue to follow for now w/o additional procedures as patient remained clinically stable - I did not feel that repeat EGD at this time would have added much to his care - should the bleeding declare itself more clearly in the future we may have to reconsider   Coagulopathy Due to cirrhosis of liver - vitamin K has not affected his INR/PTT therefore will not continue at d/c   Small subdural hemorrhage due to fall in ED Clinically the patient appears stable - follow-up CT head 8/20 revealed stability/improvement - mental status is fully intact at time of d/c   Pancytopenia Due to cirrhosis  Cirrhosis due to NASH  HTN Blood pressure controlled  COPD Quiescent  DM Reasonably controlled th/o this hospital stay   Gout Quiescent  OSA  Legally blind    Medication List    TAKE these medications        acetaminophen 325 MG tablet  Commonly known as:  TYLENOL  Take 650 mg by mouth every 4 (four) hours as needed for mild pain. 1-2 tablets every 4-6 hours as needed for pain.     allopurinol 100 MG tablet  Commonly known as:  ZYLOPRIM  Take 1 tablet (100 mg total) by mouth 2 (two) times daily.     brimonidine-timolol 0.2-0.5 % ophthalmic solution  Commonly known as:  COMBIGAN  Place 1 drop into the left eye 2 (two) times daily.     cholestyramine 4 G packet  Commonly known as:  QUESTRAN  Take 1 packet (4 g total) by mouth 3 (three) times daily.     colchicine 0.6 MG tablet  Take 0.6 mg by mouth daily as needed (for gout flares).     ferrous sulfate 325 (65 FE) MG tablet  Take 1 tablet (325 mg total) by mouth 3 (three) times daily with meals.      furosemide 40 MG tablet  Commonly known as:  LASIX  Take 1 tablet (40 mg total) by mouth daily.     insulin aspart 100 UNIT/ML injection  Commonly known as:  novoLOG  Inject 20-25 Units into the skin 3 (three) times daily before meals. Pt uses 20 units at breakfast and 25 units at lunch and supper. Pt also adds units of insulin based on a sliding scale:  200-250:  Add 3 units  251-300:  Add 6 units  301-350:  Add 9 units  351-400:  Add 12 units  Over 401:  Add 15 units     insulin glargine 100 UNIT/ML injection  Commonly known as:  LANTUS  Inject 0.7 mLs (70 Units total) into the skin at bedtime. Take 40 units tonight (8/22) and then resume your usual dose of 70 units on 8/23     lactulose 10 GM/15ML solution  Commonly known as:  CHRONULAC  Take 45 mLs (30 g total) by mouth 2 (two) times daily.     omeprazole 20 MG capsule  Commonly known as:  PRILOSEC  Take 1 capsule (20 mg total) by mouth 2 (two) times daily before a meal.     ondansetron 4 MG tablet  Commonly known as:  ZOFRAN  Take 4 mg by mouth every 8 (eight) hours as needed for nausea or vomiting.     REFRESH OPTIVE 1-0.9 % Gel  Generic drug:  Carboxymethylcellul-Glycerin  Apply 1 drop to eye 4 (four) times daily. Pt uses in the left eye.     rifaximin 550 MG Tabs tablet  Commonly known as:  XIFAXAN  Take 1 tablet (550 mg total) by mouth 2 (two) times daily.     sertraline 25 MG tablet  Commonly known as:  ZOLOFT  Take 25 mg by mouth daily.     traMADol 50 MG tablet  Commonly known as:  ULTRAM  Take 1 tablet (50 mg total) by mouth every 8 (eight) hours as needed for moderate pain.     Vitamin D-3 1000 UNITS Caps  Take 1 capsule (1,000 Units total) by mouth daily.        Day of Discharge BP 136/68 mmHg  Pulse 74  Temp(Src) 97.8 F (36.6 C) (Oral)  Resp 16  Ht 5\' 4"  (1.626 m)  Wt 79.3 kg (174 lb 13.2 oz)  BMI 29.99 kg/m2  SpO2 96%  Physical Exam: General: No acute respiratory distress Lungs: Clear to  auscultation bilaterally without wheezes or crackles Cardiovascular: Regular rate and rhythm without murmur gallop or rub normal S1 and S2 Abdomen: Nontender, protuberent, soft, bowel sounds positive, no rebound, no ascites, no appreciable mass Extremities: No significant cyanosis, clubbing, or edema bilateral lower extremities  Basic Metabolic Panel:  Recent Labs Lab 08/08/15 1040 08/09/15 4166 08/10/15 0224 08/11/15 0253 08/12/15  0305  NA 138 135 133* 132* 135  K 3.8 4.3 4.1 3.9 4.2  CL 110 105 106 101 102  CO2 25 24 21* 24 25  GLUCOSE 65 87 152* 150* 173*  BUN 11 8 10 9 8   CREATININE 0.63 0.71 0.74 0.72 0.79  CALCIUM 8.7* 8.6* 8.2* 8.8* 9.1    Liver Function Tests:  Recent Labs Lab 08/08/15 1040 08/09/15 0613 08/10/15 0224 08/11/15 0253 08/12/15 0305  AST 73* 65* 68* 68* 67*  ALT 27 25 24 27 29   ALKPHOS 114 111 110 109 125  BILITOT 3.7* 4.7* 4.7* 5.8* 7.0*  PROT 7.3 6.6 6.9 7.1 7.5  ALBUMIN 3.1* 2.6* 2.5* 2.7* 2.8*    Recent Labs Lab 08/10/15 0224  AMMONIA 110*   Coags:  Recent Labs Lab 08/09/15 0127 08/11/15 0253 08/12/15 0305  INR 1.79* 1.82* 1.66*   CBC:  Recent Labs Lab 08/09/15 0613 08/09/15 1305 08/10/15 0224 08/11/15 0253 08/12/15 0305  WBC 3.0* 3.6* 3.0* 3.7* 3.7*  HGB 7.2* 7.2* 7.2* 7.8* 9.1*  HCT 21.9* 22.1* 22.0* 23.7* 27.7*  MCV 93.2 93.6 94.8 95.2 93.9  PLT 51* 62* 63* 58* 56*    CBG:  Recent Labs Lab 08/11/15 1224 08/11/15 1609 08/11/15 2124 08/12/15 0738 08/12/15 1136  GLUCAP 194* 207* 204* 197* 249*    Recent Results (from the past 240 hour(s))  MRSA PCR Screening     Status: None   Collection Time: 08/08/15  9:26 PM  Result Value Ref Range Status   MRSA by PCR NEGATIVE NEGATIVE Final    Comment:        The GeneXpert MRSA Assay (FDA approved for NASAL specimens only), is one component of a comprehensive MRSA colonization surveillance program. It is not intended to diagnose MRSA infection nor to guide  or monitor treatment for MRSA infections.       Time spent in discharge (includes decision making & examination of pt): >35 minutes  08/12/2015, 4:41 PM   Cherene Altes, [name] [facility] Office  [phone] Pager [phone]  On-Call/Text Page:      [email or web address]      password [facility]

## 2015-08-12 NOTE — Care Management Important Message (Signed)
Important Message  Patient Details  Name: Luke WINGERTER Sr. MRN: [id number] Date of Birth: Nov 14, 1946   Medicare Important Message Given:  Yes-second notification given    Sharin Mons, RN 08/12/2015, 3:20 PM

## 2015-08-12 NOTE — Progress Notes (Signed)
Pt discharged home with wife. All d/c instructions gone over with patient and his wife, all questions answered.

## 2015-08-12 NOTE — Discharge Instructions (Signed)
Anemia, Nonspecific  Anemia is a condition in which the concentration of red blood cells or hemoglobin in the blood is below normal. Hemoglobin is a substance in red blood cells that carries oxygen to the tissues of the body. Anemia results in not enough oxygen reaching these tissues.  CAUSES  Common causes of anemia include:   Excessive bleeding. Bleeding may be internal or external. This includes excessive bleeding from periods (in women) or from the intestine.   Poor nutrition.   Chronic kidney, thyroid, and liver disease.  Bone marrow disorders that decrease red blood cell production.  Cancer and treatments for cancer.  HIV, AIDS, and their treatments.  Spleen problems that increase red blood cell destruction.  Blood disorders.  Excess destruction of red blood cells due to infection, medicines, and autoimmune disorders. SIGNS AND SYMPTOMS   Minor weakness.   Dizziness.   Headache.  Palpitations.   Shortness of breath, especially with exercise.   Paleness.  Cold sensitivity.  Indigestion.  Nausea.  Difficulty sleeping.  Difficulty concentrating. Symptoms may occur suddenly or they may develop slowly.  DIAGNOSIS  Additional blood tests are often needed. These help your health care provider determine the best treatment. Your health care provider will check your stool for blood and look for other causes of blood loss.  TREATMENT  Treatment varies depending on the cause of the anemia. Treatment can include:   Supplements of iron, vitamin W25, or folic acid.   Hormone medicines.   A blood transfusion. This may be needed if blood loss is severe.   Hospitalization. This may be needed if there is significant continual blood loss.   Dietary changes.  Spleen removal. HOME CARE INSTRUCTIONS Keep all follow-up appointments. It often takes many weeks to correct anemia, and having your health care provider check on your condition and your response to  treatment is very important. SEEK IMMEDIATE MEDICAL CARE IF:   You develop extreme weakness, shortness of breath, or chest pain.   You become dizzy or have trouble concentrating.  You develop heavy vaginal bleeding.   You develop a rash.   You have bloody or black, tarry stools.   You faint.   You vomit up blood.   You vomit repeatedly.   You have abdominal pain.  You have a fever or persistent symptoms for more than 2-3 days.   You have a fever and your symptoms suddenly get worse.   You are dehydrated.  MAKE SURE YOU:  Understand these instructions.  Will watch your condition.  Will get help right away if you are not doing well or get worse. Document Released: 01/14/2005 Document Revised: 08/09/2013 Document Reviewed: 06/02/2013 [facility] Patient Information 2015 [place], [place]. This information is not intended to replace advice given to you by your health care provider. Make sure you discuss any questions you have with your health care provider.  Gastrointestinal Bleeding  Gastrointestinal bleeding is bleeding somewhere along the path that food travels through the body (digestive tract). This path is anywhere between the mouth and the opening of the butt (anus). You may have blood in your throw up (vomit) or in your poop (stools). If there is a lot of bleeding, you may need to stay in the hospital. [place]  Only take medicine as told by your doctor.  Eat foods with fiber such as whole grains, fruits, and vegetables. You can also try eating 1 to 3 prunes a day.  Drink enough fluids to keep your pee (urine) clear or  pale yellow. GET HELP RIGHT AWAY IF:   Your bleeding gets worse.  You feel dizzy, weak, or you pass out (faint).  You have bad cramps in your back or belly (abdomen).  You have large blood clumps (clots) in your poop.  Your problems are getting worse. MAKE SURE YOU:   Understand these instructions.  Will watch your  condition.  Will get help right away if you are not doing well or get worse. Document Released: 09/15/2008 Document Revised: 11/23/2012 Document Reviewed: 11/16/2011 [facility] Patient Information 2015 [place], [place]. This information is not intended to replace advice given to you by your health care provider. Make sure you discuss any questions you have with your health care provider.

## 2015-08-15 NOTE — Progress Notes (Signed)
[place]  Telephone:(336) [phone] [phone]) [phone]     ID: Luke Manner Sr. OB: 1946-04-25  MR#: [id number]  EZM#:[id number]  Patient Care Team: Adrian Prows, MD as PCP - General (Infectious Diseases) Manya Silvas, MD as PCP - Gastroenterology (Gastroenterology)  CHIEF COMPLAINT/DIAGNOSIS:  1. Anemia, seondary to chronic liver disease and h/o iron deficiency - hemoglobin 7.8 on 07/21/12 alongwith elevated TIBC along with low iron saturation of 11%, started IV Venofer on 07/26/12. Got Venofer 500 mg in Feb 2014 and in Oct 2014.   2. Anemia, thrombocytopenia, ascites - likely from underlying chronic liver disease (history of non-alcoholic cirrhosis with splenomegaly/hypersplenism).  Workup otherwise negative for underlying hematological disorder (including peripheral blood immunophenotyping study, BCR-ABL study, [id number] mutation, ANA, B12, folate, HBsAg, HCV Ab,  HIV Ab.  Iron study showed mild iron deficiency).   HISTORY OF PRESENT ILLNESS:  Patient is referred back here for progressive anemia. States he has easy fatigue on exertion. He has chronic low blood counts due to chronic liver disease and splenomegaly/hypersplenism. He has history of iron deficiency and last received a dose of venofer in October 2014. 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 He has chronic low back pain from arthritis but denies new bone pains. Denies any obvious bleeding symptoms. Appetite is steady. States he had stool OB positive few weeks ago, and is following with GI.   REVIEW OF SYSTEMS:   ROS As in HPI above. In addition, no fever, chills or sweats. No new headaches or focal weakness.  No new sore throat, cough, sputum, hemoptysis or chest pain. No dizziness or palpitation. No abdominal pain, constipation, diarrhea, dysuria or hematuria. No new skin rash or bleeding symptoms. No new paresthesias in extremities.   PAST MEDICAL HISTORY:  Reviewed. Past Medical History  Diagnosis Date  . Stroke   . Hypertension   . COPD (chronic obstructive pulmonary disease)   . Diabetes mellitus without complication   . H/O hiatal hernia   . Depression   . Arthritis   . Hepatitis     NASH  . Cirrhosis of liver not due to alcohol   . Obstructive sleep apnea 11/20/2014  . Hyperlipidemia   . Hiatal hernia   . GI bleeds, multiple during admission   . Restless leg syndrome           Non-alcoholic cirrhosis of the liver.  Hyperlipidemia.  Obstructive sleep apnea.  Transient ischemic attack.  Hypertension.  Diabetes.  Arthritis.  Gout.  Blindness.   Hiatal hernia.   Restless leg syndrome.  History of GI bleed.   Appendectomy.   Laser surgery on eyes.  Nose surgery.   Leg surgery.  PAST SURGICAL HISTORY: Reviewed. Past Surgical History  Procedure Laterality Date  . Nose surgery    . Appendectomy    . Cardiac catheterization    . Eye surgery      leaglly blind  . Anterior cervical decompression/discectomy fusion 4 levels N/A 11/13/2014    Procedure:  Cervical three-four, Cervical four-five, Cervical five-six, Cervical six- seven anterior cervical decompression with fusion and interbody prosthesis plating and bonegraft;  Surgeon: Consuella Lose, MD;  Location: [place] [facility];  Service: Neurosurgery;  Laterality: N/A;    FAMILY HISTORY: Reviewed. Family History  Problem Relation Age of Onset  . Kidney failure Sister     CKD  . Diabetes Mellitus II Sister     SOCIAL HISTORY: Reviewed. Social History  Substance  Use Topics  . Smoking status: Never Smoker   . Smokeless tobacco: Never Used  . Alcohol Use: No    Allergies  Allergen Reactions  . Actos [Pioglitazone] Anaphylaxis  . Aminophylline Other (See Comments)    Reaction: Unknown  . Amitriptyline Other (See Comments)    Reaction: Unknown   . Ampicillin Hives  . Hctz [Hydrochlorothiazide] Other (See Comments)    Reaction: Unknown   . Naproxen Other (See  Comments)    Reaction: Unknown   . Penicillins Rash    Current Outpatient Prescriptions  Medication Sig Dispense Refill  . acetaminophen (TYLENOL) 325 MG tablet Take 650 mg by mouth every 4 (four) hours as needed for mild pain. 1-2 tablets every 4-6 hours as needed for pain.    . brimonidine-timolol (COMBIGAN) 0.2-0.5 % ophthalmic solution Place 1 drop into the left eye 2 (two) times daily.     . Cholecalciferol (VITAMIN D-3) 1000 UNITS CAPS Take 1 capsule (1,000 Units total) by mouth daily. 30 capsule 1  . cholestyramine (QUESTRAN) 4 G packet Take 1 packet (4 g total) by mouth 3 (three) times daily. 60 each 12  . ferrous sulfate 325 (65 FE) MG tablet Take 1 tablet (325 mg total) by mouth 3 (three) times daily with meals. 90 tablet 3  . furosemide (LASIX) 40 MG tablet Take 1 tablet (40 mg total) by mouth daily. 30 tablet 1  . insulin aspart (NOVOLOG) 100 UNIT/ML injection Inject 20-25 Units into the skin 3 (three) times daily before meals. Pt uses 20 units at breakfast and 25 units at lunch and supper. Pt also adds units of insulin based on a sliding scale:  200-250:  Add 3 units  251-300:  Add 6 units  301-350:  Add 9 units  351-400:  Add 12 units  Over 401:  Add 15 units    . lactulose (CHRONULAC) 10 GM/15ML solution Take 45 mLs (30 g total) by mouth 2 (two) times daily. (Patient taking differently: Take 20 g by mouth 2 (two) times daily. ) 240 mL 0  . omeprazole (PRILOSEC) 20 MG capsule Take 1 capsule (20 mg total) by mouth 2 (two) times daily before a meal. 60 capsule 1  . rifaximin (XIFAXAN) 550 MG TABS tablet Take 1 tablet (550 mg total) by mouth 2 (two) times daily. 60 tablet 1  . traMADol (ULTRAM) 50 MG tablet Take 1 tablet (50 mg total) by mouth every 8 (eight) hours as needed for moderate pain. 90 tablet 0  . allopurinol (ZYLOPRIM) 100 MG tablet Take 1 tablet (100 mg total) by mouth 2 (two) times daily. 60 tablet 1  . Carboxymethylcellul-Glycerin (REFRESH OPTIVE) 1-0.9 % GEL Apply  1 drop to eye 4 (four) times daily. Pt uses in the left eye.    . colchicine 0.6 MG tablet Take 0.6 mg by mouth daily as needed (for gout flares).    . insulin glargine (LANTUS) 100 UNIT/ML injection Inject 0.7 mLs (70 Units total) into the skin at bedtime. Take 40 units tonight (8/22) and then resume your usual dose of 70 units on 8/23 10 mL 11  . ondansetron (ZOFRAN) 4 MG tablet Take 4 mg by mouth every 8 (eight) hours as needed for nausea or vomiting.    . sertraline (ZOLOFT) 25 MG tablet Take 25 mg by mouth daily.     No current facility-administered medications for this visit.    PHYSICAL EXAM: Filed Vitals:   08/02/15 0851  BP: 131/70  Pulse: 87  Temp:  98.4 F (36.9 C)  Resp: 20     Body mass index is 30.33 kg/(m^2).       GENERAL: Patient is alert and oriented and in no acute distress. There is no icterus. Pallor +. HEENT: EOMs intact. No cervical lymphadenopathy. CVS: S1S2, regular LUNGS: Bilaterally clear to auscultation, no rhonchi. ABDOMEN: Soft, nontender.   EXTREMITIES: No pedal edema.   LAB RESULTS: 08/01/15 - Hb 7.4, WBC 3.1, plt 51, folate 12.8, iron sat 14%, ferritin 36, serum iron low at 33. SPEP negative for M-spike.   ASSESSMENT / PLAN:   Anemia, thrombocytopenia, ascites along with iron deficiency likely from GI blood loss (AVMs).  Patient has chronic pancytopenia secondary to known history of underlying chronic liver disease (history of non-alcoholic cirrhosis with splenomegaly/hypersplenism). Also has h/o iron deficiency with last dose of IV Venofer in October 2014 - reviewed labs from yesterday and d/w patient. He has developed progressive anemia with evidence of iron deficiency. Given significant symptoms will transfuse 1 unit PRBC today, pre-med with tylenol and benadryl. Patient explained about risks and benefits of blood tx, he is agreeable and signed consent form. For iron def, will schedule for IV Venofer 500 mg x 1 dose next week. If patient needs to  undergo planned invasive procedures, recommend giving PRBC tx and platelet tx as indicated by lab values at that time. Will continue to monitor labs at q4 weeks, next MD f/u at 12 weeks with repeat labs and make further plan of management.  In between visits, patient advised to call MD or go to ER in case of any fevers, bleeding, acute sickness, or new syptoms. He is agreeable to this plan.    Leia Alf, MD   08/15/2015 11:09 PM

## 2015-08-16 ENCOUNTER — Other Ambulatory Visit: Payer: Self-pay | Admitting: Physical Medicine & Rehabilitation

## 2015-08-20 ENCOUNTER — Emergency Department: Payer: Medicare Other

## 2015-08-20 ENCOUNTER — Encounter: Payer: Self-pay | Admitting: Emergency Medicine

## 2015-08-20 ENCOUNTER — Emergency Department
Admission: EM | Admit: 2015-08-20 | Discharge: 2015-08-20 | Disposition: A | Discharge status: Home / Self Care | Payer: Medicare Other | Attending: Emergency Medicine | Admitting: Emergency Medicine

## 2015-08-20 DIAGNOSIS — Z8673 Personal history of transient ischemic attack (TIA), and cerebral infarction without residual deficits: Secondary | ICD-10-CM | POA: Insufficient documentation

## 2015-08-20 DIAGNOSIS — D649 Anemia, unspecified: Secondary | ICD-10-CM | POA: Diagnosis not present

## 2015-08-20 DIAGNOSIS — E119 Type 2 diabetes mellitus without complications: Secondary | ICD-10-CM | POA: Insufficient documentation

## 2015-08-20 DIAGNOSIS — Z794 Long term (current) use of insulin: Secondary | ICD-10-CM | POA: Insufficient documentation

## 2015-08-20 DIAGNOSIS — I10 Essential (primary) hypertension: Secondary | ICD-10-CM | POA: Insufficient documentation

## 2015-08-20 DIAGNOSIS — K921 Melena: Secondary | ICD-10-CM | POA: Diagnosis not present

## 2015-08-20 DIAGNOSIS — Z79899 Other long term (current) drug therapy: Secondary | ICD-10-CM | POA: Insufficient documentation

## 2015-08-20 DIAGNOSIS — G4489 Other headache syndrome: Secondary | ICD-10-CM | POA: Insufficient documentation

## 2015-08-20 DIAGNOSIS — J449 Chronic obstructive pulmonary disease, unspecified: Secondary | ICD-10-CM | POA: Insufficient documentation

## 2015-08-20 DIAGNOSIS — Z880 Allergy status to penicillin: Secondary | ICD-10-CM | POA: Insufficient documentation

## 2015-08-20 LAB — CBC WITH DIFFERENTIAL/PLATELET
BASOS ABS: 0 10*3/uL (ref 0–0.1)
EOS ABS: 0.2 10*3/uL (ref 0–0.7)
Eosinophils Relative: 4 %
HCT: 23.5 % — ABNORMAL LOW (ref 40.0–52.0)
HEMOGLOBIN: 7.7 g/dL — AB (ref 13.0–18.0)
Lymphocytes Relative: 13 %
Lymphs Abs: 0.5 10*3/uL — ABNORMAL LOW (ref 1.0–3.6)
MCH: 32.1 pg (ref 26.0–34.0)
MCHC: 32.8 g/dL (ref 32.0–36.0)
MCV: 97.9 fL (ref 80.0–100.0)
Monocytes Absolute: 0.4 10*3/uL (ref 0.2–1.0)
Neutro Abs: 2.6 10*3/uL (ref 1.4–6.5)
Platelets: 45 10*3/uL — ABNORMAL LOW (ref 150–440)
RBC: 2.4 MIL/uL — AB (ref 4.40–5.90)
RDW: 23.2 % — ABNORMAL HIGH (ref 11.5–14.5)
WBC: 3.7 10*3/uL — AB (ref 3.8–10.6)

## 2015-08-20 LAB — BASIC METABOLIC PANEL
Anion gap: 6 (ref 5–15)
BUN: 15 mg/dL (ref 6–20)
CALCIUM: 8.4 mg/dL — AB (ref 8.9–10.3)
CO2: 23 mmol/L (ref 22–32)
CREATININE: 0.67 mg/dL (ref 0.61–1.24)
Chloride: 108 mmol/L (ref 101–111)
Glucose, Bld: 95 mg/dL (ref 65–99)
Potassium: 3.7 mmol/L (ref 3.5–5.1)
SODIUM: 137 mmol/L (ref 135–145)

## 2015-08-20 LAB — PROTIME-INR
INR: 1.84
PROTHROMBIN TIME: 21.4 s — AB (ref 11.4–15.0)

## 2015-08-20 MED ORDER — ONDANSETRON HCL 4 MG/2ML IJ SOLN
4.0000 mg | Freq: Once | INTRAMUSCULAR | Status: AC
  Administered 2015-08-20: 4 mg via INTRAVENOUS
  Filled 2015-08-20: qty 2

## 2015-08-20 MED ORDER — ONDANSETRON HCL 4 MG PO TABS: 4.0000 mg | ORAL_TABLET | Freq: Three times a day (TID) | ORAL | Status: AC | PRN

## 2015-08-20 MED ORDER — HYDROMORPHONE HCL 1 MG/ML IJ SOLN
1.0000 mg | Freq: Once | INTRAMUSCULAR | Status: AC
  Administered 2015-08-20: 1 mg via INTRAVENOUS
  Filled 2015-08-20: qty 1

## 2015-08-20 MED ORDER — HYDROCODONE-ACETAMINOPHEN 5-325 MG PO TABS: 1.0000 | ORAL_TABLET | Freq: Four times a day (QID) | ORAL | Status: DC | PRN

## 2015-08-20 NOTE — Discharge Instructions (Signed)
General Headache Without Cause A general headache is pain or discomfort felt around the head or neck area. The cause may not be found.  HOME CARE   Keep all doctor visits.  Only take medicines as told by your doctor.  Lie down in a dark, quiet room when you have a headache.  Keep a journal to find out if certain things bring on headaches. For example, write down:  What you eat and drink.  How much sleep you get.  Any change to your diet or medicines.  Relax by getting a massage or doing other relaxing activities.  Put ice or heat packs on the head and neck area as told by your doctor.  Lessen stress.  Sit up straight. Do not tighten (tense) your muscles.  Quit smoking if you smoke.  Lessen how much alcohol you drink.  Lessen how much caffeine you drink, or stop drinking caffeine.  Eat and sleep on a regular schedule.  Get 7 to 9 hours of sleep, or as told by your doctor.  Keep lights dim if bright lights bother you or make your headaches worse. GET HELP RIGHT AWAY IF:   Your headache becomes really bad.  You have a fever.  You have a stiff neck.  You have trouble seeing.  Your muscles are weak, or you lose muscle control.  You lose your balance or have trouble walking.  You feel like you will pass out (faint), or you pass out.  You have really bad symptoms that are different than your first symptoms.  You have problems with the medicines given to you by your doctor.  Your medicines do not work.  Your headache feels different than the other headaches.  You feel sick to your stomach (nauseous) or throw up (vomit). MAKE SURE YOU:   Understand these instructions.  Will watch your condition.  Will get help right away if you are not doing well or get worse. Document Released: 09/15/2008 Document Revised: 02/29/2012 Document Reviewed: 11/27/2011 [facility] Patient Information 2015 [place], [place]. This information is not intended to replace advice given to  you by your health care provider. Make sure you discuss any questions you have with your health care provider.  Please return immediately if condition worsens. Please contact her primary physician or the physician you were given for referral. If you have any specialist physicians involved in her treatment and plan please also contact them. Thank you for using [facility].

## 2015-08-20 NOTE — ED Notes (Signed)
Wife and daughter took pt in wheelchair, states did not need help.

## 2015-08-20 NOTE — ED Notes (Signed)
Presents to ED via EMS from home with c/o headache, associated with nausea and vomiting. Per EMS, EKG unremarkable, CBG-123, BP=138/88, O2-99% on room air. 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 Bruise noted around right eye and raised hematoma on right forehead from a fall during last hospitalization per pt reports.

## 2015-08-20 NOTE — ED Provider Notes (Signed)
Time Seen: Approximately 1705 I have reviewed the triage notes  Chief Complaint: Headache   History of Present Illness: Luke LADAY Sr. is a 69 y.o. male who was recently hospitalized at [facility] along with [facility] for exacerbation of anemia. Patient had a fall with head trauma and had a very small subdural hematoma. 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 He's been worked up with his anemia recently with no obvious etiology other than his chronic disease. He denies any melena or hematochezia. Patient was concerned and brought here by his family because he's been having some right-sided headaches at the location of his trauma. Concern was over an exacerbation of the small subdural hematoma. He has not had any mental status changes and only has some mild nausea but no persistent vomiting.   Past Medical History  Diagnosis Date  . Stroke   . Hypertension   . COPD (chronic obstructive pulmonary disease)   . Diabetes mellitus without complication   . H/O hiatal hernia   . Depression   . Arthritis   . Hepatitis     NASH  . Cirrhosis of liver not due to alcohol   . Obstructive sleep apnea 11/20/2014  . Hyperlipidemia   . Hiatal hernia   . GI bleeds, multiple during admission   . Restless leg syndrome     Patient Active Problem List   Diagnosis Date Noted  . Pancytopenia 08/09/2015  . SDH (subdural hematoma) 08/09/2015  . Gout 08/09/2015  . Hypertension   . Cirrhosis of liver not due to alcohol   . Hyperlipidemia   . Iron deficiency anemia   . Gastric polyp   . Upper GI bleed 07/19/2015  . UTI (lower urinary tract infection) 11/23/2014  . Thrombocytopenia 11/23/2014  . acute on chronic anemia 11/23/2014  . NASH (nonalcoholic steatohepatitis) 11/21/2014  . Stenosis of cervical spine region 11/20/2014  . Type 2 diabetes mellitus,  uncontrolled, with retinopathy 11/20/2014  . History of COPD 11/20/2014  . Diabetes mellitus with diabetic retinopathy   . Acute respiratory acidosis   . Encounter for intubation   . Encounter for orogastric (OG) tube placement   . Surgery, other elective   . Cervical spondylosis with myelopathy 11/13/2014    Past Surgical History  Procedure Laterality Date  . Nose surgery    . Appendectomy    . Cardiac catheterization    . Eye surgery      leaglly blind  . Anterior cervical decompression/discectomy fusion 4 levels N/A 11/13/2014    Procedure:  Cervical three-four, Cervical four-five, Cervical five-six, Cervical six- seven anterior cervical decompression with fusion and interbody prosthesis plating and bonegraft;  Surgeon: Consuella Lose, MD;  Location: [place] [facility];  Service: Neurosurgery;  Laterality: N/A;    Past Surgical History  Procedure Laterality Date  . Nose surgery    . Appendectomy    . Cardiac catheterization    . Eye surgery      leaglly blind  . Anterior cervical decompression/discectomy fusion 4 levels N/A 11/13/2014    Procedure:  Cervical three-four, Cervical four-five, Cervical five-six, Cervical six- seven anterior cervical decompression with fusion and interbody prosthesis plating and bonegraft;  Surgeon: Consuella Lose, MD;  Location: [place] [facility];  Service: Neurosurgery;  Laterality: N/A;    Current Outpatient Rx  Name  Route  Sig  Dispense  Refill  .  acetaminophen (TYLENOL) 325 MG tablet   Oral   Take 325-650 mg by mouth every 4 (four) hours as needed for mild pain.          Marland Kitchen allopurinol (ZYLOPRIM) 100 MG tablet   Oral   Take 1 tablet (100 mg total) by mouth 2 (two) times daily.   60 tablet   1   . brimonidine-timolol (COMBIGAN) 0.2-0.5 % ophthalmic solution   Left Eye   Place 1 drop into the left eye 2 (two) times daily.          . Carboxymethylcellul-Glycerin (REFRESH OPTIVE) 1-0.9 % GEL   Ophthalmic   Apply 1 drop to eye 4 (four)  times daily. Pt uses in the left eye.         . Cholecalciferol (VITAMIN D-3) 1000 UNITS CAPS   Oral   Take 1 capsule (1,000 Units total) by mouth daily.   30 capsule   1   . cholestyramine (QUESTRAN) 4 G packet   Oral   Take 1 packet (4 g total) by mouth 3 (three) times daily.   60 each   12   . colchicine 0.6 MG tablet   Oral   Take 0.6 mg by mouth daily as needed (for gout flares).         . ferrous sulfate 325 (65 FE) MG tablet   Oral   Take 1 tablet (325 mg total) by mouth 3 (three) times daily with meals.   90 tablet   3   . furosemide (LASIX) 40 MG tablet   Oral   Take 1 tablet (40 mg total) by mouth daily.   30 tablet   1   . insulin aspart (NOVOLOG) 100 UNIT/ML injection   Subcutaneous   Inject 20-25 Units into the skin 3 (three) times daily before meals. Pt uses 20 units at breakfast and 25 units at lunch and supper. Pt also adds units of insulin based on a sliding scale:  200-250:  Add 3 units  251-300:  Add 6 units  301-350:  Add 9 units  351-400:  Add 12 units  Over 401:  Add 15 units         . insulin glargine (LANTUS) 100 UNIT/ML injection   Subcutaneous   Inject 0.7 mLs (70 Units total) into the skin at bedtime. Take 40 units tonight (8/22) and then resume your usual dose of 70 units on 8/23 Patient taking differently: Inject 80 Units into the skin at bedtime.    10 mL   11   . lactulose (CHRONULAC) 10 GM/15ML solution   Oral   Take 45 mLs (30 g total) by mouth 2 (two) times daily. Patient taking differently: Take 20 g by mouth 2 (two) times daily.    240 mL   0   . omeprazole (PRILOSEC) 20 MG capsule   Oral   Take 1 capsule (20 mg total) by mouth 2 (two) times daily before a meal.   60 capsule   1   . rifaximin (XIFAXAN) 550 MG TABS tablet   Oral   Take 1 tablet (550 mg total) by mouth 2 (two) times daily.   60 tablet   1   . sertraline (ZOLOFT) 50 MG tablet   Oral   Take 50 mg by mouth daily.         Marland Kitchen  HYDROcodone-acetaminophen (NORCO) 5-325 MG per tablet   Oral   Take 1 tablet by mouth every 6 (six) hours as needed for  moderate pain.   20 tablet   0   . ondansetron (ZOFRAN) 4 MG tablet   Oral   Take 1 tablet (4 mg total) by mouth every 8 (eight) hours as needed for nausea or vomiting.   21 tablet   0     Allergies:  Actos; Aminophylline; Amitriptyline; Ampicillin; Hctz; Naproxen; and Penicillins  Family History: Family History  Problem Relation Age of Onset  . Kidney failure Sister     CKD  . Diabetes Mellitus II Sister     Social History: Social History  Substance Use Topics  . Smoking status: Never Smoker   . Smokeless tobacco: Never Used  . Alcohol Use: No     Review of Systems:   10 point review of systems was performed and was otherwise negative:  Constitutional: No fever Eyes: No visual disturbances ENT: No sore throat, ear pain Cardiac: No chest pain Respiratory: No shortness of breath, wheezing, or stridor Abdomen: No abdominal pain, no vomiting, No diarrhea Endocrine: No weight loss, No night sweats Extremities: No peripheral edema, cyanosis Skin: No rashes, chronic easy bruising Neurologic: No focal weakness, trouble with speech or swollowing Urologic: No dysuria, Hematuria, or urinary frequency   Physical Exam:  ED Triage Vitals  Enc Vitals Group     BP 08/20/15 1658 134/46 mmHg     Pulse Rate 08/20/15 1658 81     Resp 08/20/15 1658 16     Temp 08/20/15 1658 98.1 F (36.7 C)     Temp Source 08/20/15 1658 Oral     SpO2 08/20/15 1658 98 %     Weight --      Height --      Head Cir --      Peak Flow --      Pain Score 08/20/15 1659 8     Pain Loc --      Pain Edu? --      Excl. in [place]? --     General: Awake , Alert , and Oriented times 3; GCS 15 Head: Patient has a well healing well aligned sutures right frontal region with some ecchymosis surrounding this area along around his right eye. There is no facial crepitus or step-off noted  with palpation. Eyes: Pupils equal , round, reactive to light Nose/Throat: No nasal drainage, patent upper airway without erythema or exudate.  Neck: Supple, Full range of motion, No anterior adenopathy or palpable thyroid masses Lungs: Clear to ascultation without wheezes , rhonchi, or rales Heart: Regular rate, regular rhythm without murmurs , gallops , or rubs Abdomen: Abdomen shows distention typical of cirrhosis without any obvious fluid wave. It is no rebound guarding rigidity.        Extremities: 2 plus symmetric pulses. No edema, clubbing or cyanosis Neurologic: normal ambulation, Motor symmetric without deficits, sensory intact Skin: Skin shows multiple contusions., Paleness. No petechiae or purpura noted.  Labs:   All laboratory work was reviewed including any pertinent negatives or positives listed below:  Labs Reviewed  BASIC METABOLIC PANEL - Abnormal; Notable for the following:    Calcium 8.4 (*)    All other components within normal limits  CBC WITH DIFFERENTIAL/PLATELET - Abnormal; Notable for the following:    WBC 3.7 (*)    RBC 2.40 (*)    Hemoglobin 7.7 (*)    HCT 23.5 (*)    RDW 23.2 (*)    Platelets 45 (*)    Lymphs Abs 0.5 (*)    All other components within  normal limits  PROTIME-INR - Abnormal; Notable for the following:    Prothrombin Time 21.4 (*)    All other components within normal limits   review laboratory work shows anemia however this seems to be at baseline for the patient.    Radiology:    EXAM: CT HEAD WITHOUT CONTRAST  TECHNIQUE: Contiguous axial images were obtained from the base of the skull through the vertex without intravenous contrast.  COMPARISON: 08/10/2015  FINDINGS: Tiny left frontal subdural hematoma is similar in size compared to the prior study and has decreased in density, measuring up to 3 mm in thickness and without associated mass effect. There is no evidence of new intracranial hemorrhage, acute large  territory infarct, mass, or midline shift. Mild generalized cerebral atrophy is within normal limits for age.  Visualized orbits are unremarkable. A small right frontal scalp hematoma is again seen. Visualized paranasal sinuses and mastoid air cells are without significant inflammatory change. No skull fracture is identified.  IMPRESSION: 1. Decreased density of tiny left frontal subdural hematoma, unchanged in size and without mass effect. 2. No evidence of new intracranial abnormality.    I personally reviewed the radiologic studies. It does not seem to be any significant change from CAT scan performed on 8/20     ED Course:  Patient's stay was uneventful and review of his CAT scan and laboratory work shows what appears to be some stability and rather chronically ill individual with a history of cirrhosis, anemia, etc. The patient has a follow-up appointment with his primary physician on Thursday of this week they've been advised to keep that appointment. I feel the patient requires transfusion at this time. The source of his headache is likely from his previous trauma and possibly secondary to his anemia. He was prescribed general pain medication and advised to return if there is any other new concerns.   Assessment:  Acute cephalgia Recent head trauma with a small subdural hematoma Anemia Cirrhosis      Plan:  Patient was advised to return immediately if condition worsens. Patient was advised to follow up with her primary care physician or other specialized physicians involved and in their current assessment.           Go to top  Daymon Larsen, MD 08/20/15 [phone]

## 2015-08-22 ENCOUNTER — Inpatient Hospital Stay
Admission: AD | Admit: 2015-08-22 | Discharge: 2015-08-30 | DRG: 378 | Disposition: A | Discharge status: Home / Self Care | Payer: Medicare Other | Source: Ambulatory Visit | Attending: Internal Medicine | Admitting: Internal Medicine

## 2015-08-22 DIAGNOSIS — K766 Portal hypertension: Secondary | ICD-10-CM | POA: Diagnosis present

## 2015-08-22 DIAGNOSIS — G8929 Other chronic pain: Secondary | ICD-10-CM | POA: Diagnosis present

## 2015-08-22 DIAGNOSIS — Z8673 Personal history of transient ischemic attack (TIA), and cerebral infarction without residual deficits: Secondary | ICD-10-CM | POA: Diagnosis not present

## 2015-08-22 DIAGNOSIS — D61818 Other pancytopenia: Secondary | ICD-10-CM | POA: Diagnosis present

## 2015-08-22 DIAGNOSIS — M545 Low back pain: Secondary | ICD-10-CM | POA: Diagnosis present

## 2015-08-22 DIAGNOSIS — J449 Chronic obstructive pulmonary disease, unspecified: Secondary | ICD-10-CM | POA: Diagnosis present

## 2015-08-22 DIAGNOSIS — H548 Legal blindness, as defined in USA: Secondary | ICD-10-CM | POA: Diagnosis present

## 2015-08-22 DIAGNOSIS — G4733 Obstructive sleep apnea (adult) (pediatric): Secondary | ICD-10-CM | POA: Diagnosis present

## 2015-08-22 DIAGNOSIS — K31819 Angiodysplasia of stomach and duodenum without bleeding: Secondary | ICD-10-CM | POA: Diagnosis present

## 2015-08-22 DIAGNOSIS — D376 Neoplasm of uncertain behavior of liver, gallbladder and bile ducts: Secondary | ICD-10-CM | POA: Diagnosis present

## 2015-08-22 DIAGNOSIS — Z794 Long term (current) use of insulin: Secondary | ICD-10-CM | POA: Diagnosis not present

## 2015-08-22 DIAGNOSIS — Z66 Do not resuscitate: Secondary | ICD-10-CM | POA: Diagnosis not present

## 2015-08-22 DIAGNOSIS — R160 Hepatomegaly, not elsewhere classified: Secondary | ICD-10-CM | POA: Diagnosis not present

## 2015-08-22 DIAGNOSIS — D638 Anemia in other chronic diseases classified elsewhere: Secondary | ICD-10-CM

## 2015-08-22 DIAGNOSIS — R17 Unspecified jaundice: Secondary | ICD-10-CM

## 2015-08-22 DIAGNOSIS — I10 Essential (primary) hypertension: Secondary | ICD-10-CM | POA: Diagnosis present

## 2015-08-22 DIAGNOSIS — M109 Gout, unspecified: Secondary | ICD-10-CM | POA: Diagnosis present

## 2015-08-22 DIAGNOSIS — D731 Hypersplenism: Secondary | ICD-10-CM | POA: Insufficient documentation

## 2015-08-22 DIAGNOSIS — Z833 Family history of diabetes mellitus: Secondary | ICD-10-CM

## 2015-08-22 DIAGNOSIS — D500 Iron deficiency anemia secondary to blood loss (chronic): Secondary | ICD-10-CM | POA: Diagnosis present

## 2015-08-22 DIAGNOSIS — K921 Melena: Secondary | ICD-10-CM | POA: Diagnosis present

## 2015-08-22 DIAGNOSIS — D62 Acute posthemorrhagic anemia: Secondary | ICD-10-CM | POA: Diagnosis present

## 2015-08-22 DIAGNOSIS — M1990 Unspecified osteoarthritis, unspecified site: Secondary | ICD-10-CM | POA: Diagnosis present

## 2015-08-22 DIAGNOSIS — D649 Anemia, unspecified: Secondary | ICD-10-CM | POA: Diagnosis present

## 2015-08-22 DIAGNOSIS — F329 Major depressive disorder, single episode, unspecified: Secondary | ICD-10-CM | POA: Diagnosis present

## 2015-08-22 DIAGNOSIS — R945 Abnormal results of liver function studies: Secondary | ICD-10-CM | POA: Diagnosis not present

## 2015-08-22 DIAGNOSIS — Z515 Encounter for palliative care: Secondary | ICD-10-CM

## 2015-08-22 DIAGNOSIS — G629 Polyneuropathy, unspecified: Secondary | ICD-10-CM | POA: Diagnosis present

## 2015-08-22 DIAGNOSIS — D689 Coagulation defect, unspecified: Secondary | ICD-10-CM | POA: Diagnosis present

## 2015-08-22 DIAGNOSIS — K7581 Nonalcoholic steatohepatitis (NASH): Secondary | ICD-10-CM | POA: Diagnosis present

## 2015-08-22 DIAGNOSIS — K449 Diaphragmatic hernia without obstruction or gangrene: Secondary | ICD-10-CM | POA: Diagnosis present

## 2015-08-22 DIAGNOSIS — E11319 Type 2 diabetes mellitus with unspecified diabetic retinopathy without macular edema: Secondary | ICD-10-CM | POA: Diagnosis present

## 2015-08-22 DIAGNOSIS — E785 Hyperlipidemia, unspecified: Secondary | ICD-10-CM | POA: Diagnosis present

## 2015-08-22 DIAGNOSIS — G2581 Restless legs syndrome: Secondary | ICD-10-CM | POA: Diagnosis present

## 2015-08-22 DIAGNOSIS — K7469 Other cirrhosis of liver: Secondary | ICD-10-CM | POA: Diagnosis not present

## 2015-08-22 DIAGNOSIS — D6959 Other secondary thrombocytopenia: Secondary | ICD-10-CM | POA: Insufficient documentation

## 2015-08-22 LAB — GLUCOSE, CAPILLARY
Glucose-Capillary: 145 mg/dL — ABNORMAL HIGH (ref 65–99)
Glucose-Capillary: 119 mg/dL — ABNORMAL HIGH (ref 65–99)

## 2015-08-22 LAB — PREPARE RBC (CROSSMATCH)

## 2015-08-22 MED ORDER — INSULIN GLARGINE 100 UNIT/ML ~~LOC~~ SOLN
80.0000 [IU] | Freq: Every day | SUBCUTANEOUS | Status: DC
  Administered 2015-08-22: 80 [IU] via SUBCUTANEOUS
  Filled 2015-08-22 (×2): qty 0.8

## 2015-08-22 MED ORDER — SERTRALINE HCL 50 MG PO TABS
50.0000 mg | ORAL_TABLET | Freq: Every day | ORAL | Status: DC
  Administered 2015-08-24 – 2015-08-30 (×7): 50 mg via ORAL
  Filled 2015-08-22 (×7): qty 1

## 2015-08-22 MED ORDER — ONDANSETRON HCL 4 MG PO TABS: 4.0000 mg | ORAL_TABLET | Freq: Four times a day (QID) | ORAL | Status: DC | PRN

## 2015-08-22 MED ORDER — INSULIN ASPART 100 UNIT/ML ~~LOC~~ SOLN: 20.0000 [IU] | Freq: Three times a day (TID) | SUBCUTANEOUS | Status: DC

## 2015-08-22 MED ORDER — SODIUM CHLORIDE 0.9 % IV SOLN
Freq: Once | INTRAVENOUS | Status: AC
  Administered 2015-08-22: 18:00:00 via INTRAVENOUS

## 2015-08-22 MED ORDER — ALLOPURINOL 100 MG PO TABS
100.0000 mg | ORAL_TABLET | Freq: Two times a day (BID) | ORAL | Status: DC
  Administered 2015-08-22 – 2015-08-30 (×15): 100 mg via ORAL
  Filled 2015-08-22 (×15): qty 1

## 2015-08-22 MED ORDER — ACETAMINOPHEN 650 MG RE SUPP
650.0000 mg | Freq: Four times a day (QID) | RECTAL | Status: DC | PRN
Start: 2015-08-22 — End: 2015-08-23

## 2015-08-22 MED ORDER — CHOLESTYRAMINE 4 G PO PACK
4.0000 g | PACK | Freq: Three times a day (TID) | ORAL | Status: DC
  Administered 2015-08-22 – 2015-08-28 (×16): 4 g via ORAL
  Administered 2015-08-28: 12:00:00 via ORAL
  Administered 2015-08-28 – 2015-08-30 (×5): 4 g via ORAL
  Filled 2015-08-22 (×22): qty 1

## 2015-08-22 MED ORDER — SODIUM CHLORIDE 0.9 % IV SOLN
Freq: Once | INTRAVENOUS | Status: AC
  Administered 2015-08-22: 14:00:00 via INTRAVENOUS

## 2015-08-22 MED ORDER — POLYVINYL ALCOHOL 1.4 % OP SOLN
1.0000 [drp] | Freq: Four times a day (QID) | OPHTHALMIC | Status: DC
  Administered 2015-08-22 – 2015-08-30 (×29): 1 [drp] via OPHTHALMIC
  Filled 2015-08-22: qty 15

## 2015-08-22 MED ORDER — LACTULOSE 10 GM/15ML PO SOLN
20.0000 g | Freq: Two times a day (BID) | ORAL | Status: DC
  Administered 2015-08-22: 21:00:00 20 g via ORAL
  Filled 2015-08-22: qty 30

## 2015-08-22 MED ORDER — FUROSEMIDE 40 MG PO TABS
40.0000 mg | ORAL_TABLET | Freq: Every day | ORAL | Status: DC
  Administered 2015-08-24 – 2015-08-30 (×7): 40 mg via ORAL
  Filled 2015-08-22 (×7): qty 1

## 2015-08-22 MED ORDER — VITAMIN D3 25 MCG (1000 UNIT) PO TABS
1000.0000 [IU] | ORAL_TABLET | Freq: Every day | ORAL | Status: DC
  Administered 2015-08-24 – 2015-08-30 (×7): 1000 [IU] via ORAL
  Filled 2015-08-22 (×15): qty 1

## 2015-08-22 MED ORDER — RIFAXIMIN 550 MG PO TABS
550.0000 mg | ORAL_TABLET | Freq: Two times a day (BID) | ORAL | Status: DC
  Administered 2015-08-22 – 2015-08-30 (×15): 550 mg via ORAL
  Filled 2015-08-22 (×15): qty 1

## 2015-08-22 MED ORDER — PANTOPRAZOLE SODIUM 40 MG PO TBEC
40.0000 mg | DELAYED_RELEASE_TABLET | Freq: Every day | ORAL | Status: DC
  Administered 2015-08-24 – 2015-08-30 (×7): 40 mg via ORAL
  Filled 2015-08-22 (×7): qty 1

## 2015-08-22 MED ORDER — BRIMONIDINE TARTRATE-TIMOLOL 0.2-0.5 % OP SOLN
1.0000 [drp] | Freq: Two times a day (BID) | OPHTHALMIC | Status: DC
  Administered 2015-08-22 – 2015-08-30 (×16): 1 [drp] via OPHTHALMIC
  Filled 2015-08-22: qty 5

## 2015-08-22 MED ORDER — INSULIN ASPART 100 UNIT/ML ~~LOC~~ SOLN
25.0000 [IU] | Freq: Two times a day (BID) | SUBCUTANEOUS | Status: DC
  Administered 2015-08-22 (×2): 25 [IU] via SUBCUTANEOUS
  Filled 2015-08-22 (×2): qty 25

## 2015-08-22 MED ORDER — ONDANSETRON HCL 4 MG PO TABS: 4.0000 mg | ORAL_TABLET | Freq: Three times a day (TID) | ORAL | Status: DC | PRN

## 2015-08-22 MED ORDER — SODIUM CHLORIDE 0.9 % IJ SOLN
3.0000 mL | INTRAMUSCULAR | Status: DC | PRN
  Administered 2015-08-24: 3 mL via INTRAVENOUS
  Filled 2015-08-22: qty 10

## 2015-08-22 MED ORDER — ONDANSETRON HCL 4 MG/2ML IJ SOLN
4.0000 mg | Freq: Four times a day (QID) | INTRAMUSCULAR | Status: DC | PRN
  Administered 2015-08-24: 4 mg via INTRAVENOUS
  Filled 2015-08-22: qty 2

## 2015-08-22 MED ORDER — ACETAMINOPHEN 325 MG PO TABS: 650.0000 mg | ORAL_TABLET | Freq: Four times a day (QID) | ORAL | Status: DC | PRN

## 2015-08-22 MED ORDER — SODIUM CHLORIDE 0.9 % IJ SOLN
3.0000 mL | Freq: Two times a day (BID) | INTRAMUSCULAR | Status: DC
  Administered 2015-08-22 – 2015-08-30 (×15): 3 mL via INTRAVENOUS

## 2015-08-22 MED ORDER — FERROUS SULFATE 325 (65 FE) MG PO TABS
325.0000 mg | ORAL_TABLET | Freq: Three times a day (TID) | ORAL | Status: DC
  Administered 2015-08-22 – 2015-08-30 (×21): 325 mg via ORAL
  Filled 2015-08-22 (×21): qty 1

## 2015-08-22 MED ORDER — COLCHICINE 0.6 MG PO TABS: 0.6000 mg | ORAL_TABLET | Freq: Every day | ORAL | Status: DC | PRN

## 2015-08-22 MED ORDER — INSULIN ASPART 100 UNIT/ML ~~LOC~~ SOLN: 20.0000 [IU] | Freq: Every day | SUBCUTANEOUS | Status: DC

## 2015-08-22 MED ORDER — HYDROCODONE-ACETAMINOPHEN 5-325 MG PO TABS: 1.0000 | ORAL_TABLET | Freq: Four times a day (QID) | ORAL | Status: DC | PRN

## 2015-08-22 MED ORDER — ACETAMINOPHEN 325 MG PO TABS: 325.0000 mg | ORAL_TABLET | ORAL | Status: DC | PRN

## 2015-08-22 NOTE — Plan of Care (Signed)
Problem: Discharge Progression Outcomes Goal: Other Discharge Outcomes/Goals Plan of care progress: -HGB 7.7, transfused 1 unit PRBC no noted reaction -continue to monitor labs, HGB -GI consulted -tolerates diet -no complaints of pain, no distress or discomfort noted

## 2015-08-22 NOTE — Progress Notes (Signed)
Patient is active with [facility] skilled nursing and physical therapy services. Will need resumption orders at discharge.

## 2015-08-22 NOTE — Consult Note (Signed)
GI Inpatient Consult Note  Reason for [place]   Attending Requesting Consult:Dr. Fritzi Mandes  History of Present Illness: Luke Euceda. is a 69 y.o. male  Past Medical History:  Past Medical History  Diagnosis Date  . Stroke   . Hypertension   . COPD (chronic obstructive pulmonary disease)   . Diabetes mellitus without complication   . H/O hiatal hernia   . Depression   . Arthritis   . Hepatitis     NASH  . Cirrhosis of liver not due to alcohol   . Obstructive sleep apnea 11/20/2014  . Hyperlipidemia   . Hiatal hernia   . GI bleeds, multiple during admission   . Restless leg syndrome     Problem List: Patient Active Problem List   Diagnosis Date Noted  . Anemia with chronic illness 08/22/2015  . Pancytopenia 08/09/2015  . SDH (subdural hematoma) 08/09/2015  . Gout 08/09/2015  . Hypertension   . Cirrhosis of liver not due to alcohol   . Hyperlipidemia   . Iron deficiency anemia   . Gastric polyp   . Upper GI bleed 07/19/2015  . UTI (lower urinary tract infection) 11/23/2014  . Thrombocytopenia 11/23/2014  . Anemia 11/23/2014  . NASH (nonalcoholic steatohepatitis) 11/21/2014  . Stenosis of cervical spine region 11/20/2014  . Type 2 diabetes mellitus, uncontrolled, with retinopathy 11/20/2014  . History of COPD 11/20/2014  . Diabetes mellitus with diabetic retinopathy   . Acute respiratory acidosis   . Encounter for intubation   . Encounter for orogastric (OG) tube placement   . Surgery, other elective   . Cervical spondylosis with myelopathy 11/13/2014    Past Surgical History: Past Surgical History  Procedure Laterality Date  . Nose surgery    . Appendectomy    . Cardiac catheterization    . Eye surgery      leaglly blind  . Anterior cervical decompression/discectomy fusion 4 levels N/A 11/13/2014    Procedure:  Cervical three-four, Cervical four-five, Cervical five-six, Cervical six- seven anterior cervical decompression with fusion and  interbody prosthesis plating and bonegraft;  Surgeon: Consuella Lose, MD;  Location: [place] [facility];  Service: Neurosurgery;  Laterality: N/A;    Allergies: Allergies  Allergen Reactions  . Actos [Pioglitazone] Anaphylaxis  . Aminophylline Other (See Comments)    Reaction: Unknown  . Amitriptyline Other (See Comments)    Reaction: Unknown   . Ampicillin Hives  . Hctz [Hydrochlorothiazide] Other (See Comments)    Reaction: Unknown   . Naproxen Other (See Comments)    Reaction:  GI bleeding   . Penicillins Rash    Home Medications: Prescriptions prior to admission  Medication Sig Dispense Refill Last Dose  . acetaminophen (TYLENOL) 325 MG tablet Take 325-650 mg by mouth every 4 (four) hours as needed for mild pain.    Past Month at Unknown time  . allopurinol (ZYLOPRIM) 100 MG tablet Take 1 tablet (100 mg total) by mouth 2 (two) times daily. 60 tablet 1 08/22/2015 at Unknown time  . brimonidine-timolol (COMBIGAN) 0.2-0.5 % ophthalmic solution Place 1 drop into the left eye 2 (two) times daily.    08/22/2015 at Unknown time  . Carboxymethylcellul-Glycerin (REFRESH OPTIVE) 1-0.9 % GEL Apply 1 drop to eye 4 (four) times daily. Pt uses in the left eye.   08/22/2015 at Unknown time  . Cholecalciferol (VITAMIN D-3) 1000 UNITS CAPS Take 1 capsule (1,000 Units total) by mouth daily. 30 capsule 1 08/22/2015 at Unknown time  . cholestyramine Lucrezia Starch)  4 G packet Take 1 packet (4 g total) by mouth 3 (three) times daily. 60 each 12 08/22/2015 at Unknown time  . colchicine 0.6 MG tablet Take 0.6 mg by mouth daily as needed (for gout flares).   Past Month at Unknown time  . ferrous sulfate 325 (65 FE) MG tablet Take 1 tablet (325 mg total) by mouth 3 (three) times daily with meals. 90 tablet 3 08/22/2015 at Unknown time  . furosemide (LASIX) 40 MG tablet Take 1 tablet (40 mg total) by mouth daily. 30 tablet 1 08/22/2015 at Unknown time  . HYDROcodone-acetaminophen (NORCO) 5-325 MG per tablet Take 1 tablet by mouth  every 6 (six) hours as needed for moderate pain. 20 tablet 0 Past Month at Unknown time  . insulin aspart (NOVOLOG) 100 UNIT/ML injection Inject 20-25 Units into the skin 3 (three) times daily before meals. Pt uses 20 units at breakfast and 25 units at lunch and supper. Pt also adds units of insulin based on a sliding scale:  200-250:  Add 3 units  251-300:  Add 6 units  301-350:  Add 9 units  351-400:  Add 12 units  Over 401:  Add 15 units   08/22/2015 at Unknown time  . insulin glargine (LANTUS) 100 UNIT/ML injection Inject 0.7 mLs (70 Units total) into the skin at bedtime. Take 40 units tonight (8/22) and then resume your usual dose of 70 units on 8/23 (Patient taking differently: Inject 80 Units into the skin at bedtime. ) 10 mL 11 08/21/2015 at Unknown time  . lactulose (CHRONULAC) 10 GM/15ML solution Take 45 mLs (30 g total) by mouth 2 (two) times daily. (Patient taking differently: Take 20 g by mouth 2 (two) times daily. ) 240 mL 0 08/22/2015 at Unknown time  . omeprazole (PRILOSEC) 20 MG capsule Take 1 capsule (20 mg total) by mouth 2 (two) times daily before a meal. 60 capsule 1 08/22/2015 at Unknown time  . ondansetron (ZOFRAN) 4 MG tablet Take 1 tablet (4 mg total) by mouth every 8 (eight) hours as needed for nausea or vomiting. 21 tablet 0 Past Month at Unknown time  . rifaximin (XIFAXAN) 550 MG TABS tablet Take 1 tablet (550 mg total) by mouth 2 (two) times daily. 60 tablet 1 08/22/2015 at Unknown time  . sertraline (ZOLOFT) 50 MG tablet Take 50 mg by mouth daily.   08/22/2015 at Unknown time   Home medication reconciliation was completed with the patient.   Scheduled Inpatient Medications:   . allopurinol  100 mg Oral BID  . brimonidine-timolol  1 drop Both Eyes BID  . Carboxymethylcellul-Glycerin  1 drop Ophthalmic QID  . [START ON 08/23/2015] cholecalciferol  1,000 Units Oral Daily  . cholestyramine  4 g Oral TID  . ferrous sulfate  325 mg Oral TID WC  . [START ON 08/23/2015] furosemide   40 mg Oral Daily  . [START ON 08/23/2015] insulin aspart  20 Units Subcutaneous Q breakfast  . insulin aspart  25 Units Subcutaneous BID AC  . insulin glargine  80 Units Subcutaneous QHS  . lactulose  20 g Oral BID  . [START ON 08/23/2015] pantoprazole  40 mg Oral Daily  . rifaximin  550 mg Oral BID  . [START ON 08/23/2015] sertraline  50 mg Oral Daily  . sodium chloride  3 mL Intravenous Q12H    Continuous Inpatient Infusions:     PRN Inpatient Medications:  acetaminophen **OR** acetaminophen, colchicine, HYDROcodone-acetaminophen, ondansetron **OR** ondansetron (ZOFRAN) IV, sodium chloride  Family History: family history includes Diabetes Mellitus II in his sister; Kidney failure in his sister.  The patient's family history is negative for inflammatory bowel disorders, GI malignancy, or solid organ transplantation.  Social History:   reports that he has never smoked. He has never used smokeless tobacco. He reports that he does not drink alcohol. The patient denies ETOH, tobacco, or drug use.   Review of Systems: Constitutional: Weight is stable.  Eyes: No changes in vision. ENT: No oral lesions, sore throat.  GI: see HPI.  Heme/Lymph: No easy bruising.  CV: No chest pain.  GU: No hematuria.  Integumentary: No rashes.  Neuro: No headaches. Pt had recent sub dural hematoma from a fall. Psych: No depression/anxiety.  Endocrine: No heat/cold intolerance.  Allergic/Immunologic: No urticaria.  Resp: No cough, SOB.  Musculoskeletal: No joint swelling.    Physical Examination: BP 126/54 mmHg  Pulse 79  Temp(Src) 98 F (36.7 C) (Oral)  Resp 18  Ht 5\' 4"  (1.626 m)  Wt 78.727 kg (173 lb 9 oz)  BMI 29.78 kg/m2  SpO2 100% Gen: NAD, alert and oriented x 4 HEENT: PEERLA, EOMI, knot on right forehead Neck: supple, no JVD or thyromegaly Chest: CTA bilaterally, no wheezes, crackles, or other adventitious sounds CV: RRR, 1/6 SEM Abd: very tight distended with bowel sounds present.  Ascites Ext: no edema, well perfused with 2+ pulses, Skin: no rash or lesions noted Lymph: no LAD  Data: Lab Results  Component Value Date   WBC 3.7* 08/20/2015   HGB 7.7* 08/20/2015   HCT 23.5* 08/20/2015   MCV 97.9 08/20/2015   PLT 45* 08/20/2015    Recent Labs Lab 08/20/15 1705  HGB 7.7*   Lab Results  Component Value Date   NA 137 08/20/2015   K 3.7 08/20/2015   CL 108 08/20/2015   CO2 23 08/20/2015   BUN 15 08/20/2015   CREATININE 0.67 08/20/2015   Lab Results  Component Value Date   ALT 29 08/12/2015   AST 67* 08/12/2015   ALKPHOS 125 08/12/2015   BILITOT 7.0* 08/12/2015    Recent Labs Lab 08/20/15 1705  INR 1.84   Assessment/Plan: [name] is a 69 y.o. male   Recommendations:Due to hx of portal hypertensive gastropathy will transfuse with platelets in morning and do an EGD thereafter.  Thank you for the consult. Please call with questions or concerns.  Gaylyn Cheers, MD

## 2015-08-22 NOTE — H&P (Signed)
[place] [facility] [place] NAME: [name]    MR#:  [id number]  DATE OF BIRTH:  09/09/1946  DATE OF ADMISSION:  08/22/2015  PRIMARY CARE PHYSICIAN: Adrian Prows, MD   REQUESTING/REFERRING PHYSICIAN: Dr fitzgerald  CHIEF COMPLAINT:  Low hemoglobin on follow blood work  HISTORY OF PRESENT ILLNESS:  [name]  is a 69 y.o. male with a known history of recurrent GI bleeding, hyperlipidemia, diabetes insulin requiring, COPD, gout, cirrhosis secondary to NASH, diabetic retinopathy comes from Dr. Blane Ohara office as a direct admit for low hemoglobin count.  Patient was recently discharged from [facility] after he had a subdural hematoma requiring 1 unit of blood transfusion his mobility at discharge was 9.1 on 08/12/2015. Patient went for follow-up with PCP was found to have hemoglobin of 7.7. He denies any complaints at present. Does have melanotic stools. He is on iron supplement. Denies any coffee-ground emesis or any bright red blood per rectum. Denies any abdominal pain. Denies any fever.  Pt had colonoscopy7/11/2012 which showed 5 small polyps. He had an EGD 07/01/2012 that showed gastritis. He had a normal capsule endoscopy 03/28/2013. He had an EGD noting AVMs of the stomach 12/27/2014, and another EGD on 07/20/2015 revealing only mild gastritis w/ a benign gastric polyp.    PAST MEDICAL HISTORY:   Past Medical History  Diagnosis Date  . Stroke   . Hypertension   . COPD (chronic obstructive pulmonary disease)   . Diabetes mellitus without complication   . H/O hiatal hernia   . Depression   . Arthritis   . Hepatitis     NASH  . Cirrhosis of liver not due to alcohol   . Obstructive sleep apnea 11/20/2014  . Hyperlipidemia   . Hiatal hernia   . GI bleeds, multiple during admission   . Restless leg syndrome     PAST SURGICAL HISTOIRY:   Past Surgical History  Procedure Laterality Date  . Nose surgery    .  Appendectomy    . Cardiac catheterization    . Eye surgery      leaglly blind  . Anterior cervical decompression/discectomy fusion 4 levels N/A 11/13/2014    Procedure:  Cervical three-four, Cervical four-five, Cervical five-six, Cervical six- seven anterior cervical decompression with fusion and interbody prosthesis plating and bonegraft;  Surgeon: Consuella Lose, MD;  Location: [facility];  Service: Neurosurgery;  Laterality: N/A;    SOCIAL HISTORY:   Social History  Substance Use Topics  . Smoking status: Never Smoker   . Smokeless tobacco: Never Used  . Alcohol Use: No    FAMILY HISTORY:   Family History  Problem Relation Age of Onset  . Kidney failure Sister     CKD  . Diabetes Mellitus II Sister     DRUG ALLERGIES:   Allergies  Allergen Reactions  . Actos [Pioglitazone] Anaphylaxis  . Aminophylline Other (See Comments)    Reaction: Unknown  . Amitriptyline Other (See Comments)    Reaction: Unknown   . Ampicillin Hives  . Hctz [Hydrochlorothiazide] Other (See Comments)    Reaction: Unknown   . Naproxen Other (See Comments)    Reaction:  GI bleeding   . Penicillins Rash    REVIEW OF SYSTEMS:  Review of Systems  Constitutional: Negative for fever, chills and diaphoresis.  HENT: Negative for congestion, ear pain, hearing loss, nosebleeds and sore throat.   Eyes: Negative for blurred vision, double vision, photophobia and pain.  Respiratory:  Negative for hemoptysis, sputum production, wheezing and stridor.   Cardiovascular: Negative for orthopnea, claudication and leg swelling.  Gastrointestinal: Positive for melena. Negative for heartburn, nausea, abdominal pain and blood in stool.  Genitourinary: Negative for dysuria and frequency.  Musculoskeletal: Negative for back pain, joint pain and neck pain.  Skin: Negative for rash.  Neurological: Positive for weakness. Negative for tingling, sensory change, speech change, focal weakness, seizures and headaches.   Endo/Heme/Allergies: Does not bruise/bleed easily.  Psychiatric/Behavioral: Negative for memory loss. The patient is not nervous/anxious.   All other systems reviewed and are negative.    MEDICATIONS AT HOME:   Prior to Admission medications   Medication Sig Start Date End Date Taking? Authorizing Provider  acetaminophen (TYLENOL) 325 MG tablet Take 325-650 mg by mouth every 4 (four) hours as needed for mild pain.     Historical Provider, MD  allopurinol (ZYLOPRIM) 100 MG tablet Take 1 tablet (100 mg total) by mouth 2 (two) times daily. 11/30/14   Lavon Paganini Angiulli, PA-C  brimonidine-timolol (COMBIGAN) 0.2-0.5 % ophthalmic solution Place 1 drop into the left eye 2 (two) times daily.     Historical Provider, MD  Carboxymethylcellul-Glycerin (REFRESH OPTIVE) 1-0.9 % GEL Apply 1 drop to eye 4 (four) times daily. Pt uses in the left eye.    Historical Provider, MD  Cholecalciferol (VITAMIN D-3) 1000 UNITS CAPS Take 1 capsule (1,000 Units total) by mouth daily. 11/30/14   Lavon Paganini Angiulli, PA-C  cholestyramine Lucrezia Starch) 4 G packet Take 1 packet (4 g total) by mouth 3 (three) times daily. 11/30/14   Lavon Paganini Angiulli, PA-C  colchicine 0.6 MG tablet Take 0.6 mg by mouth daily as needed (for gout flares).    Historical Provider, MD  ferrous sulfate 325 (65 FE) MG tablet Take 1 tablet (325 mg total) by mouth 3 (three) times daily with meals. 11/30/14   Lavon Paganini Angiulli, PA-C  furosemide (LASIX) 40 MG tablet Take 1 tablet (40 mg total) by mouth daily. 11/30/14   Lavon Paganini Angiulli, PA-C  HYDROcodone-acetaminophen (NORCO) 5-325 MG per tablet Take 1 tablet by mouth every 6 (six) hours as needed for moderate pain. 08/20/15   Daymon Larsen, MD  insulin aspart (NOVOLOG) 100 UNIT/ML injection Inject 20-25 Units into the skin 3 (three) times daily before meals. Pt uses 20 units at breakfast and 25 units at lunch and supper. Pt also adds units of insulin based on a sliding scale:  200-250:  Add 3 units   251-300:  Add 6 units  301-350:  Add 9 units  351-400:  Add 12 units  Over 401:  Add 15 units    Historical Provider, MD  insulin glargine (LANTUS) 100 UNIT/ML injection Inject 0.7 mLs (70 Units total) into the skin at bedtime. Take 40 units tonight (8/22) and then resume your usual dose of 70 units on 8/23 Patient taking differently: Inject 80 Units into the skin at bedtime.  08/12/15   Cherene Altes, MD  lactulose (CHRONULAC) 10 GM/15ML solution Take 45 mLs (30 g total) by mouth 2 (two) times daily. Patient taking differently: Take 20 g by mouth 2 (two) times daily.  11/30/14   Lavon Paganini Angiulli, PA-C  omeprazole (PRILOSEC) 20 MG capsule Take 1 capsule (20 mg total) by mouth 2 (two) times daily before a meal. 11/30/14   Lavon Paganini Angiulli, PA-C  ondansetron (ZOFRAN) 4 MG tablet Take 1 tablet (4 mg total) by mouth every 8 (eight) hours as needed for nausea or  vomiting. 08/20/15 08/27/15  Daymon Larsen, MD  rifaximin (XIFAXAN) 550 MG TABS tablet Take 1 tablet (550 mg total) by mouth 2 (two) times daily. 11/30/14   Lavon Paganini Angiulli, PA-C  sertraline (ZOLOFT) 50 MG tablet Take 50 mg by mouth daily.    Historical Provider, MD      VITAL SIGNS:  Blood pressure 128/47, pulse 77, temperature 97.9 F (36.6 C), temperature source Oral, resp. rate 18, SpO2 100 %.  PHYSICAL EXAMINATION:  GENERAL:  69 y.o.-year-old patient lying in the bed with no acute distress. Pallor Appears chronically ill EYES: Pupils equal, round, reactive to light and accommodation. No scleral icterus. Extraocular muscles intact.  HEENT: Head atraumatic, normocephalic. Oropharynx and nasopharynx clear.  NECK:  Supple, no jugular venous distention. No thyroid enlargement, no tenderness.  LUNGS: Normal breath sounds bilaterally, no wheezing, rales,rhonchi or crepitation. No use of accessory muscles of respiration.  CARDIOVASCULAR: S1, S2 normal. No murmurs, rubs, or gallops.  ABDOMEN: Soft, nontender, distended. Bowel sounds  present. No organomegaly or mass. Ascites+ EXTREMITIES: No pedal edema, cyanosis, or clubbing.  NEUROLOGIC: Cranial nerves II through XII are intact. Muscle strength 5/5 in all extremities. Sensation intact. Gait not checked. Overall weak PSYCHIATRIC: The patient is alert and oriented x 3.  SKIN: No obvious rash, lesion, or ulcer.   LABORATORY PANEL:   CBC  Recent Labs Lab 08/20/15 1705  WBC 3.7*  HGB 7.7*  HCT 23.5*  PLT 45*   ------------------------------------------------------------------------------------------------------------------  Chemistries   Recent Labs Lab 08/20/15 1705  NA 137  K 3.7  CL 108  CO2 23  GLUCOSE 95  BUN 15  CREATININE 0.67  CALCIUM 8.4*   ------------------------------------------------------------------------------------------------------------------  Cardiac Enzymes No results for input(s): TROPONINI in the last 168 hours. ------------------------------------------------------------------------------------------------------------------  RADIOLOGY:  Ct Head Wo Contrast  08/20/2015   CLINICAL DATA:  Headache with nausea and vomiting. History of subdural hematoma.  EXAM: CT HEAD WITHOUT CONTRAST  TECHNIQUE: Contiguous axial images were obtained from the base of the skull through the vertex without intravenous contrast.  COMPARISON:  08/10/2015  FINDINGS: Tiny left frontal subdural hematoma is similar in size compared to the prior study and has decreased in density, measuring up to 3 mm in thickness and without associated mass effect. There is no evidence of new intracranial hemorrhage, acute large territory infarct, mass, or midline shift. Mild generalized cerebral atrophy is within normal limits for age.  Visualized orbits are unremarkable. A small right frontal scalp hematoma is again seen. Visualized paranasal sinuses and mastoid air cells are without significant inflammatory change. No skull fracture is identified.  IMPRESSION: 1. Decreased  density of tiny left frontal subdural hematoma, unchanged in size and without mass effect. 2. No evidence of new intracranial abnormality.   Electronically Signed   By: Logan Bores M.D.   On: 08/20/2015 17:42   IMPRESSION AND PLAN:  [name]  is a 69 y.o. male with a known history of recurrent GI bleeding, hyperlipidemia, diabetes insulin requiring, COPD, gout, cirrhosis secondary to NASH, diabetic retinopathy comes from Dr. Blane Ohara office as a direct admit for low hemoglobin count.   Acute on chronic blood loss anemia v/s low production  Hgb 7.7 at presentation  -will transfuse 1 unit PRBC today. 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 Management plans discussed with the patient, family and they are in agreement.  CODE STATUS: DNR (d/w pt)  TOTAL TIME TAKING CARE OF THIS PATIENT:73minutes.    Mayan Dolney M.D on 08/22/2015 at 11:09 AM  Between 7am to 6pm - Pager - [phone]  After 6pm go to [email or web address] - password [email or web address] [facility]  [place]  [facility]  Office  [phone]  CC: Primary care physician; Adrian Prows, MD

## 2015-08-23 ENCOUNTER — Inpatient Hospital Stay: Payer: Medicare Other | Admitting: Anesthesiology

## 2015-08-23 ENCOUNTER — Encounter: Payer: Self-pay | Admitting: Anesthesiology

## 2015-08-23 ENCOUNTER — Encounter
Admission: AD | Disposition: A | Discharge status: Home / Self Care | Payer: Self-pay | Source: Ambulatory Visit | Attending: Internal Medicine

## 2015-08-23 HISTORY — PX: ESOPHAGOGASTRODUODENOSCOPY: SHX5428

## 2015-08-23 LAB — GLUCOSE, CAPILLARY
GLUCOSE-CAPILLARY: 64 mg/dL — AB (ref 65–99)
GLUCOSE-CAPILLARY: 66 mg/dL (ref 65–99)
GLUCOSE-CAPILLARY: 76 mg/dL (ref 65–99)
GLUCOSE-CAPILLARY: 72 mg/dL (ref 65–99)
Glucose-Capillary: 153 mg/dL — ABNORMAL HIGH (ref 65–99)
Glucose-Capillary: 61 mg/dL — ABNORMAL LOW (ref 65–99)
Glucose-Capillary: 177 mg/dL — ABNORMAL HIGH (ref 65–99)
Glucose-Capillary: 154 mg/dL — ABNORMAL HIGH (ref 65–99)
Glucose-Capillary: 219 mg/dL — ABNORMAL HIGH (ref 65–99)

## 2015-08-23 LAB — CBC
HEMATOCRIT: 21.2 % — AB (ref 40.0–52.0)
HEMOGLOBIN: 7 g/dL — AB (ref 13.0–18.0)
MCH: 32.5 pg (ref 26.0–34.0)
MCHC: 33.3 g/dL (ref 32.0–36.0)
MCV: 97.7 fL (ref 80.0–100.0)
Platelets: 51 10*3/uL — ABNORMAL LOW (ref 150–440)
RBC: 2.17 MIL/uL — AB (ref 4.40–5.90)
RDW: 24 % — AB (ref 11.5–14.5)
WBC: 3.1 10*3/uL — AB (ref 3.8–10.6)

## 2015-08-23 LAB — PREPARE RBC (CROSSMATCH)

## 2015-08-23 SURGERY — EGD (ESOPHAGOGASTRODUODENOSCOPY)
Anesthesia: General

## 2015-08-23 MED ORDER — PHENYLEPHRINE HCL 10 MG/ML IJ SOLN
INTRAMUSCULAR | Status: DC | PRN
  Administered 2015-08-23: 100 ug via INTRAVENOUS

## 2015-08-23 MED ORDER — EPHEDRINE SULFATE 50 MG/ML IJ SOLN
INTRAMUSCULAR | Status: DC | PRN
  Administered 2015-08-23: 5 mg via INTRAVENOUS

## 2015-08-23 MED ORDER — SODIUM CHLORIDE 0.9 % IV SOLN
INTRAVENOUS | Status: DC
  Administered 2015-08-23: 15:00:00 via INTRAVENOUS

## 2015-08-23 MED ORDER — LACTULOSE 10 GM/15ML PO SOLN
20.0000 g | Freq: Two times a day (BID) | ORAL | Status: DC
  Administered 2015-08-23 – 2015-08-30 (×14): 20 g via ORAL
  Filled 2015-08-23 (×14): qty 30

## 2015-08-23 MED ORDER — DEXTROSE 50 % IV SOLN
INTRAVENOUS | Status: AC
  Administered 2015-08-23: 50 mL
  Filled 2015-08-23: qty 50

## 2015-08-23 MED ORDER — SODIUM CHLORIDE 0.9 % IV SOLN
Freq: Once | INTRAVENOUS | Status: AC
  Administered 2015-08-23: 14:00:00 via INTRAVENOUS

## 2015-08-23 MED ORDER — GLUCERNA SHAKE PO LIQD
237.0000 mL | Freq: Three times a day (TID) | ORAL | Status: DC
  Administered 2015-08-23 – 2015-08-30 (×20): 237 mL via ORAL

## 2015-08-23 MED ORDER — SODIUM CHLORIDE 0.9 % IV SOLN: INTRAVENOUS | Status: DC | PRN

## 2015-08-23 MED ORDER — SODIUM CHLORIDE 0.9 % IV SOLN
Freq: Once | INTRAVENOUS | Status: AC
  Administered 2015-08-23: 10:00:00 via INTRAVENOUS

## 2015-08-23 MED ORDER — DEXTROSE-NACL 5-0.9 % IV SOLN
INTRAVENOUS | Status: DC | PRN
  Administered 2015-08-23: 15:00:00 via INTRAVENOUS

## 2015-08-23 MED ORDER — PROPOFOL INFUSION 10 MG/ML OPTIME
INTRAVENOUS | Status: DC | PRN
  Administered 2015-08-23: 50 ug/kg/min via INTRAVENOUS

## 2015-08-23 MED ORDER — DEXTROSE-NACL 5-0.45 % IV SOLN
INTRAVENOUS | Status: DC
  Administered 2015-08-23: 08:00:00 via INTRAVENOUS

## 2015-08-23 MED ORDER — DEXTROSE 50 % IV SOLN
1.0000 | Freq: Once | INTRAVENOUS | Status: AC
  Administered 2015-08-23: 50 mL via INTRAVENOUS
  Filled 2015-08-23: qty 50

## 2015-08-23 MED ORDER — SODIUM CHLORIDE 0.9 % IV SOLN: INTRAVENOUS | Status: DC

## 2015-08-23 MED ORDER — DEXTROSE 50 % IV SOLN
25.0000 mL | Freq: Once | INTRAVENOUS | Status: AC
  Administered 2015-08-23: 16:00:00 25 mL via INTRAVENOUS

## 2015-08-23 NOTE — Care Management (Signed)
Admitted to [facility] with the diagnosis of anemia. Discharged from [facility] 08/20/15. Lives with wife, Vaughan Basta [phone]). Daughter is Kenney Houseman [phone]). Followed by [facility] with Nursing and physical therapy. Seen in Dr, Blane Ohara office yesterday and was sent to the emergency room. Fell in the emergency room after receiving blood. Hospital bed, rolling walker, cane and wheelchair in the home. No home oxygen. No skilled facility. Good appetite. No falls in the past, just our emergency room. NPO Scheduled for upper endoscopy today Shelbie Ammons RN MSN Care Management [phone]

## 2015-08-23 NOTE — Transfer of Care (Signed)
Immediate Anesthesia Transfer of Care Note  Patient: [name].  Procedure(s) Performed: Procedure(s): ESOPHAGOGASTRODUODENOSCOPY (EGD) (N/A)  Patient Location: Endoscopy Unit  Anesthesia Type:General  Level of Consciousness: awake, alert , oriented and patient cooperative  Airway & Oxygen Therapy: Patient Spontanous Breathing and Patient connected to nasal cannula oxygen  Post-op Assessment: Report given to RN, Post -op Vital signs reviewed and stable and Patient moving all extremities X 4  Post vital signs: Reviewed and stable  Last Vitals:  Filed Vitals:   08/23/15 1601  BP: 117/45  Pulse: 73  Temp: 36 C  Resp: 12    Complications: No apparent anesthesia complications

## 2015-08-23 NOTE — Op Note (Signed)
[facility] Gastroenterology Patient Name: [name] Procedure Date: 08/23/2015 11:48 AM MRN: [id number] Account #: [id number] Date of Birth: [birth year] Admit Type: Inpatient Age: 69 Room: [facility] ENDO ROOM 1 Gender: Male Note Status: Finalized Procedure:         Upper GI endoscopy Indications:       Melena, Suspected upper gastrointestinal bleeding Providers:         Manya Silvas, MD Referring MD:      Youlanda Roys. Ola Spurr, MD (Referring MD) Medicines:         Propofol per Anesthesia Complications:     No immediate complications. Procedure:         Pre-Anesthesia Assessment:                    - After reviewing the risks and benefits, the patient was                     deemed in satisfactory condition to undergo the procedure.                    After obtaining informed consent, the endoscope was passed                     under direct vision. Throughout the procedure, the                     patient's blood pressure, pulse, and oxygen saturations                     were monitored continuously. The Olympus GIF-160 endoscope                     (S#. [id number]) was introduced through the mouth, and                     advanced to the duodenal bulb. The upper GI endoscopy was                     accomplished without difficulty. The patient tolerated the                     procedure well. Findings:      The examined esophagus was normal.      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 Coagulation for tissue destruction using argon       plasma at 0.8 liters/minute and 30 watts was successful. A few eroded       gastric nodules were seen and were treated with the Argon also. proximal       spots in the stomach were left alone. No blood in duodenum. No fresh       bleeding after I was finished.      Pt tolerated well. Impression:        - Normal esophagus.                    - Gastric antral vascular  ectasia without bleeding.                     Treated with argon plasma coagulation (APC).                    - No specimens collected. Recommendation:    - soft food  for 3 days, eat slowly, chew well, take small                     bites Manya Silvas, MD 08/23/2015 3:55:38 PM This report has been signed electronically. Number of Addenda: 0 Note Initiated On: 08/23/2015 11:48 AM      [facility]

## 2015-08-23 NOTE — Anesthesia Postprocedure Evaluation (Signed)
  Anesthesia Post-op Note  Patient: Luke GRANLUND Sr.  Procedure(s) Performed: Procedure(s): ESOPHAGOGASTRODUODENOSCOPY (EGD) (N/A)  Anesthesia type:General  Patient location: PACU  Post pain: Pain level controlled  Post assessment: Post-op Vital signs reviewed, Patient's Cardiovascular Status Stable, Respiratory Function Stable, Patent Airway and No signs of Nausea or vomiting  Post vital signs: Reviewed and stable  Last Vitals:  Filed Vitals:   08/23/15 1609  BP: 110/48  Pulse: 71  Temp: 36 C  Resp: 13    Level of consciousness: awake, alert  and patient cooperative  Complications: No apparent anesthesia complications

## 2015-08-23 NOTE — Plan of Care (Signed)
Problem: Discharge Progression Outcomes Goal: Other Discharge Outcomes/Goals Outcome: Progressing Plan of care progress to goals: 1. No c/o pain or discomfort.  2. Hemodynamically:              -VSS, afebrile             -Hgb 7.7 & 1 unit PRBC transfused yesterday. CBC this morning, Platelets scheduled to be given at 9am 3. EGD today, NPO since midnight  4. +1 assist to [facility]. Bed alarm on, hourly rounding. Pt understands how to use call system for assistance. Will continue to assess.

## 2015-08-23 NOTE — Anesthesia Preprocedure Evaluation (Addendum)
Anesthesia Evaluation  Patient identified by MRN, date of birth, ID band Patient awake    Reviewed: Allergy & Precautions, NPO status , Patient's Chart, lab work & pertinent test results  Airway Mallampati: III       Dental  (+) Upper Dentures, Lower Dentures   Pulmonary sleep apnea , COPD   + decreased breath sounds      Cardiovascular hypertension,     Neuro/Psych Depression Hx of small subdural hematoma roughly 2 weeks ago   ...evaluated by neurosurgery  And evaluated as stable at this point CVA    GI/Hepatic hiatal hernia, (+) Hepatitis -, UnspecifiedGI bleed   Endo/Other  diabetes, Well Controlled, Type 2  Renal/GU   negative genitourinary   Musculoskeletal  (+) Arthritis -, Osteoarthritis,    Abdominal (+) + obese,   Peds negative pediatric ROS (+)  Hematology  (+) anemia , thrombocytopenia   Anesthesia Other Findings gout  Reproductive/Obstetrics                         Anesthesia Physical Anesthesia Plan  ASA: IV  Anesthesia Plan: General   Post-op Pain Management:    Induction: Intravenous  Airway Management Planned: Nasal Cannula  Additional Equipment:   Intra-op Plan:   Post-operative Plan:   Informed Consent: I have reviewed the patients History and Physical, chart, labs and discussed the procedure including the risks, benefits and alternatives for the proposed anesthesia with the patient or authorized representative who has indicated his/her understanding and acceptance.   Dental advisory given  Plan Discussed with: CRNA and Surgeon  Anesthesia Plan Comments:         Anesthesia Quick Evaluation

## 2015-08-23 NOTE — Progress Notes (Addendum)
Initial Nutrition Assessment   INTERVENTION:   Meals and Snacks: Cater to patient preferences once diet order advanced. Pt reports being blind and unable to read menu. Family at bedside currently to help with order. RD will inform host/hostess staff to aid in ordering meals as well. Will also send meats chopped. Medical Food Supplement Therapy: will recommend chocolate Glucerna Shake po TID, each supplement provides 220 kcal and 10 grams of protein   NUTRITION DIAGNOSIS:   Inadequate oral intake related to inability to eat as evidenced by NPO status for procedure this am.  GOAL:   Patient will meet greater than or equal to 90% of their needs  MONITOR:    (Energy Intake, Anthropometrics, Digestive system)   REASON FOR ASSESSMENT:   Consult Poor PO    ASSESSMENT:   Pt admitted with black stool with anemia. GI following, pRBC transfued. Pt with recent admission to [facility] with subdural hematoma. Pt scheduled for upper endoscopy this am.  Past Medical History  Diagnosis Date  . Stroke   . Hypertension   . COPD (chronic obstructive pulmonary disease)   . Diabetes mellitus without complication   . H/O hiatal hernia   . Depression   . Arthritis   . Hepatitis     NASH  . Cirrhosis of liver not due to alcohol   . Obstructive sleep apnea 11/20/2014  . Hyperlipidemia   . Hiatal hernia   . GI bleeds, multiple during admission   . Restless leg syndrome     Diet Order:  Diet NPO time specified    Current Nutrition: Pt NPO this am for procedure  Food/Nutrition-Related History: Pt reports a big appetite PTA. Pt reports usually eating 3 full meals a day. Pt reports eating 100% of dinner last night of pork, potatoes and green beans. Recorded 100% of lunch and dinner yesterday.   Medications: vitamin D, Lasix, ferrous sulfate, protonix, xifaxan, D5 0.45%NS at 41mL/hr (providing 163kcals in 24 hours)  Electrolyte/Renal Profile and Glucose Profile:   Recent Labs Lab  08/20/15 1705  NA 137  K 3.7  CL 108  CO2 23  BUN 15  CREATININE 0.67  CALCIUM 8.4*  GLUCOSE 95   Protein Profile: No results for input(s): ALBUMIN in the last 168 hours.  Nutritional Anemia Profile:  CBC Latest Ref Rng 08/23/2015 08/20/2015 08/12/2015  WBC 3.8 - 10.6 K/uL 3.1(L) 3.7(L) 3.7(L)  Hemoglobin 13.0 - 18.0 g/dL 7.0(L) 7.7(L) 9.1(L)  Hematocrit 40.0 - 52.0 % 21.2(L) 23.5(L) 27.7(L)  Platelets 150 - 440 K/uL 51(L) 45(L) 56(L)    Gastrointestinal Profile: abdomen distended Last BM:  08/22/2015 black large BM yesterday   Nutrition-Focused Physical Exam Findings: Nutrition-Focused physical exam completed. Findings are no fat depletion, mild muscle depletion, and mild edema per Nsg in BLLE.    Weight Change: Pt reports weight fluctuates PTA but has been staying between 170-175lbs recently. Per [facility] 11% weight loss since last year. Last admission 2 weeks ago pt weight 172lbs at [facility].   Skin:   (Jaundiced, Laceration to Head)  Height:   Ht Readings from Last 1 Encounters:  08/22/15 5\' 4"  (1.626 m)    Weight:   Wt Readings from Last 1 Encounters:  08/22/15 173 lb 9 oz (78.727 kg)   Wt Readings from Last 10 Encounters:  08/22/15 173 lb 9 oz (78.727 kg)  08/12/15 174 lb 13.2 oz (79.3 kg)  08/08/15 174 lb (78.926 kg)  08/02/15 176 lb 12.9 oz (80.2 kg)  07/21/15 182 lb  11.2 oz (82.872 kg)  11/21/14 221 lb 1.9 oz (100.3 kg)  11/13/14 201 lb 11.5 oz (91.5 kg)  10/02/14 195 lb 12.8 oz (88.814 kg)    Ideal Body Weight:   59kg  BMI:  Body mass index is 29.78 kg/(m^2).  Estimated Nutritional Needs:   Kcal:  BEE: 1266kcals, TEE: (IF 1.1-1.3)(AF 1.2) 1671-1972kcals, using IBW of 59kg  Protein:  59-71g protein (1.0-1.2g/kg) using IBW of 59kg  Fluid:  1475-1752mL of fluid (25-54mL/kg) using IBW of 59kg  EDUCATION NEEDS:   No education needs identified at this time    [place], RD, LDN Pager [phone]

## 2015-08-23 NOTE — Progress Notes (Addendum)
Notified verbally Dr Vianne Bulls of BS 52, pt is NPO. Hgb 7. Pt is asymptomatic for both. MD verbalized to give 1amp of D50%, D5%NaCl 0.45%@40ml /hr and to order CBG's TID before meals and at bedtime.

## 2015-08-23 NOTE — Progress Notes (Signed)
Inpatient Diabetes Program Recommendations  AACE/ADA: New Consensus Statement on Inpatient Glycemic Control (2013)  Target Ranges:  Prepandial:   less than 140 mg/dL      Peak postprandial:   less than 180 mg/dL (1-2 hours)      Critically ill patients:  140 - 180 mg/dL   Review of Glycemic Control:  Results for [name], [name] (MRN [id number]) as of 08/23/2015 13:30  Ref. Range 08/22/2015 12:24 08/22/2015 22:31 08/23/2015 07:21 08/23/2015 07:30 08/23/2015 08:14 08/23/2015 11:01 08/23/2015 12:48  Glucose-Capillary Latest Ref Range: 65-99 mg/dL 145 (H) 119 (H) 64 (L) 66 153 (H) 76 72    Diabetes history: Type 2 diabetes Outpatient Diabetes medications: Lantus 80 units q HS, Novolog 20 units with breakfast, and 25 units with lunch and supper Current orders for Inpatient glycemic control:  None  Note that patient had hypoglycemic event this morning.  He received Lantus 80 units q HS last night.  Insulin was discontinued this morning.  Patient NPO for procedure today.  Please consider restarting Novolog sensitive correction q 4 hours while NPO.  May also consider restarting basal insulin Lantus 40 units q HS (this is 1/2 of home dose).  Thanks, Adah Perl, RN, BC-ADM Inpatient Diabetes Coordinator Pager [phone]  (8a-5p)

## 2015-08-23 NOTE — Plan of Care (Signed)
Problem: Discharge Progression Outcomes Goal: Other Discharge Outcomes/Goals Outcome: Progressing VSS. Platelets and 1xunit of blood given today. No transfusion reaction occurred. Denies pain/n/v. EGD done. Soft diet initiated. Paged Dr Vianne Bulls for verification of IVF to be infused, insulin? No call back yet. Night RN notified to follow up.

## 2015-08-23 NOTE — Progress Notes (Signed)
[place] at [place] NAME: [name]    MR#:  [id number]  DATE OF BIRTH:  1946-03-23  SUBJECTIVE: admitted for low hemoglobin count. She denies any abdominal pain, nausea, vomiting. No history of further  malena.since admission. Had black stool at home,  CHIEF COMPLAINT:  No chief complaint on file.   REVIEW OF SYSTEMS:   Review of Systems  Gastrointestinal:       Ascites present   CONSTITUTIONAL: No fever, fatigue or weakness.  EYES: No blurred or double vision.  EARS, NOSE, AND THROAT: No tinnitus or ear pain.  RESPIRATORY: No cough, shortness of breath, wheezing or hemoptysis.  CARDIOVASCULAR: No chest pain, orthopnea, edema.  GASTROINTESTINAL: No nausea, vomiting, diarrhea or abdominal pain.  GENITOURINARY: No dysuria, hematuria.  ENDOCRINE: No polyuria, nocturia,  HEMATOLOGY: No anemia, easy bruising or bleeding SKIN: No rash or lesion. MUSCULOSKELETAL: No joint pain or arthritis.   NEUROLOGIC: No tingling, numbness, weakness.  PSYCHIATRY: No anxiety or depression.   DRUG ALLERGIES:   Allergies  Allergen Reactions  . Actos [Pioglitazone] Anaphylaxis  . Aminophylline Other (See Comments)    Reaction: Unknown  . Amitriptyline Other (See Comments)    Reaction: Unknown   . Ampicillin Hives  . Hctz [Hydrochlorothiazide] Other (See Comments)    Reaction: Unknown   . Naproxen Other (See Comments)    Reaction:  GI bleeding   . Penicillins Rash    VITALS:  Blood pressure 126/54, pulse 79, temperature 97.7 F (36.5 C), temperature source Oral, resp. rate 18, height 5\' 4"  (1.626 m), weight 78.727 kg (173 lb 9 oz), SpO2 99 %.  PHYSICAL EXAMINATION:  GENERAL:  69 y.o.-year-old patient lying in the bed with no acute distress.  EYES: Pupils equal, round, reactive to light and accommodation. No scleral icterus. Extraocular muscles intact.  HEENT: Head atraumatic, normocephalic. Oropharynx and nasopharynx clear.  NECK:   Supple, no jugular venous distention. No thyroid enlargement, no tenderness.  LUNGS: Normal breath sounds bilaterally, no wheezing, rales,rhonchi or crepitation. No use of accessory muscles of respiration.  CARDIOVASCULAR: S1, S2 normal. No murmurs, rubs, or gallops.  ABDOMEN: Soft, nontender,  Filled  with ascites. EXTREMITIES: No pedal edema, cyanosis, or clubbing.  NEUROLOGIC: Cranial nerves II through XII are intact. Muscle strength 5/5 in all extremities. Sensation intact. Gait not checked.  PSYCHIATRIC: The patient is alert and oriented x 3.  SKIN: No obvious rash, lesion, or ulcer.    LABORATORY PANEL:   CBC  Recent Labs Lab 08/23/15 0434  WBC 3.1*  HGB 7.0*  HCT 21.2*  PLT 51*   ------------------------------------------------------------------------------------------------------------------  Chemistries   Recent Labs Lab 08/20/15 1705  NA 137  K 3.7  CL 108  CO2 23  GLUCOSE 95  BUN 15  CREATININE 0.67  CALCIUM 8.4*   ------------------------------------------------------------------------------------------------------------------  Cardiac Enzymes No results for input(s): TROPONINI in the last 168 hours. ------------------------------------------------------------------------------------------------------------------  RADIOLOGY:  No results found.  EKG:   Orders placed or performed during the hospital encounter of 07/19/15  . ED EKG  . ED EKG  . EKG 12-Lead  . EKG 12-Lead  . EKG    ASSESSMENT AND PLAN:   1. Acute on chronic blood loss anemia: Status post 1 unit of transfusion. Due to history of portal hypertensive gastropathy patient the also is getting  platelets today morning. And a EGD later today. Continue PPIs. #2 diabetes mellitus type 2 with a hypoglycemic this morning: Received  Lantus last night. So  we will check blood glucose every 2 hours, start IV hydration D5 half at 40 an hour. To prevent further hypoglycemic episodes. 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 Management plans discussed with the patient, family and they are in agreement.  CODE STATUS: DO NOT RESUSCITATE  TOTAL TIME TAKING CARE OF THIS PATIENT: 35 minutes.   POSSIBLE D/C IN 1-2 DAYS, DEPENDING ON CLINICAL CONDITION.   Epifanio Lesches M.D on 08/23/2015 at 7:48 AM  Between 7am to 6pm - Pager - [phone]  After 6pm go to [email or web address] - password [email or web address] [facility]  [place] [facility]  Office  [phone]  CC: Primary care physician; Adrian Prows, MD

## 2015-08-23 NOTE — Progress Notes (Signed)
Notified Dr Vira Agar that pt need a diet order. MD verbalized to order puree soft diet.

## 2015-08-23 NOTE — Brief Op Note (Signed)
17:00 - Report to Ivin Poot. In 1-C Nursing Unit.

## 2015-08-23 NOTE — Procedures (Signed)
1609-  Unitt of Packed Cells infused.  No signs of blood transfusion reaction.  BP 110/48.  Pulse 70, Rest. 15 , Temp 96.8 Tympanic. oxi + 100 % on 3 Liters

## 2015-08-23 NOTE — Consult Note (Signed)
Pt had EGD showing places in stomach which could have bled.  Recommend ultra soft or pureed diet for few days while areas I cauterized heal up.  Dr. Candace Cruise to cover this weekend.  Pt got a unit of blood today.   His LFT's are very elevated likely indicating progression of cirrhosis

## 2015-08-24 ENCOUNTER — Encounter: Payer: Self-pay | Admitting: Gastroenterology

## 2015-08-24 LAB — GLUCOSE, CAPILLARY
GLUCOSE-CAPILLARY: 324 mg/dL — AB (ref 65–99)
Glucose-Capillary: 174 mg/dL — ABNORMAL HIGH (ref 65–99)
Glucose-Capillary: 290 mg/dL — ABNORMAL HIGH (ref 65–99)
Glucose-Capillary: 272 mg/dL — ABNORMAL HIGH (ref 65–99)

## 2015-08-24 LAB — COMPREHENSIVE METABOLIC PANEL
ALBUMIN: 2.6 g/dL — AB (ref 3.5–5.0)
ALK PHOS: 102 U/L (ref 38–126)
ALT: 36 U/L (ref 17–63)
AST: 87 U/L — AB (ref 15–41)
Anion gap: 3 — ABNORMAL LOW (ref 5–15)
BILIRUBIN TOTAL: 9.5 mg/dL — AB (ref 0.3–1.2)
BUN: 15 mg/dL (ref 6–20)
CALCIUM: 8.3 mg/dL — AB (ref 8.9–10.3)
CO2: 25 mmol/L (ref 22–32)
CREATININE: 0.5 mg/dL — AB (ref 0.61–1.24)
Chloride: 106 mmol/L (ref 101–111)
GFR calc Af Amer: >60 mL/min (ref >60)
GFR calc non Af Amer: >60 mL/min (ref >60)
GLUCOSE: 185 mg/dL — AB (ref 65–99)
Potassium: 4.2 mmol/L (ref 3.5–5.1)
Sodium: 134 mmol/L — ABNORMAL LOW (ref 135–145)
TOTAL PROTEIN: 6.8 g/dL (ref 6.5–8.1)

## 2015-08-24 LAB — CBC
HEMATOCRIT: 23.7 % — AB (ref 40.0–52.0)
Hemoglobin: 8.2 g/dL — ABNORMAL LOW (ref 13.0–18.0)
MCH: 33.8 pg (ref 26.0–34.0)
MCHC: 34.5 g/dL (ref 32.0–36.0)
MCV: 98.1 fL (ref 80.0–100.0)
Platelets: 55 10*3/uL — ABNORMAL LOW (ref 150–440)
RBC: 2.42 MIL/uL — ABNORMAL LOW (ref 4.40–5.90)
RDW: 24.2 % — AB (ref 11.5–14.5)
WBC: 3.8 10*3/uL (ref 3.8–10.6)

## 2015-08-24 LAB — PREPARE PLATELET PHERESIS: UNIT DIVISION: 0

## 2015-08-24 MED ORDER — INSULIN ASPART 100 UNIT/ML ~~LOC~~ SOLN
0.0000 [IU] | Freq: Three times a day (TID) | SUBCUTANEOUS | Status: DC
  Administered 2015-08-24: 7 [IU] via SUBCUTANEOUS
  Administered 2015-08-24 – 2015-08-25 (×2): 5 [IU] via SUBCUTANEOUS
  Administered 2015-08-25: 2 [IU] via SUBCUTANEOUS
  Administered 2015-08-25: 7 [IU] via SUBCUTANEOUS
  Administered 2015-08-26: 12:00:00 5 [IU] via SUBCUTANEOUS
  Administered 2015-08-26: 1 [IU] via SUBCUTANEOUS
  Administered 2015-08-26: 17:00:00 7 [IU] via SUBCUTANEOUS
  Administered 2015-08-27: 09:00:00 2 [IU] via SUBCUTANEOUS
  Administered 2015-08-27 – 2015-08-28 (×3): 7 [IU] via SUBCUTANEOUS
  Administered 2015-08-28: 3 [IU] via SUBCUTANEOUS
  Administered 2015-08-29: 5 [IU] via SUBCUTANEOUS
  Administered 2015-08-29: 08:00:00 2 [IU] via SUBCUTANEOUS
  Administered 2015-08-29: 3 [IU] via SUBCUTANEOUS
  Administered 2015-08-30: 13:00:00 5 [IU] via SUBCUTANEOUS
  Administered 2015-08-30: 08:00:00 1 [IU] via SUBCUTANEOUS
  Filled 2015-08-24 (×2): qty 5
  Filled 2015-08-24: qty 7
  Filled 2015-08-24: qty 1
  Filled 2015-08-24 (×2): qty 7
  Filled 2015-08-24: qty 5
  Filled 2015-08-24: qty 3
  Filled 2015-08-24: qty 2
  Filled 2015-08-24: qty 5
  Filled 2015-08-24 (×2): qty 2
  Filled 2015-08-24: qty 5
  Filled 2015-08-24 (×2): qty 7
  Filled 2015-08-24: qty 4
  Filled 2015-08-24: qty 1
  Filled 2015-08-24: qty 7

## 2015-08-24 MED ORDER — INSULIN ASPART 100 UNIT/ML ~~LOC~~ SOLN
0.0000 [IU] | Freq: Every day | SUBCUTANEOUS | Status: DC
  Administered 2015-08-24: 3 [IU] via SUBCUTANEOUS
  Administered 2015-08-25 – 2015-08-28 (×4): 4 [IU] via SUBCUTANEOUS
  Administered 2015-08-29: 2 [IU] via SUBCUTANEOUS
  Filled 2015-08-24 (×2): qty 4
  Filled 2015-08-24 (×2): qty 3
  Filled 2015-08-24: qty 4
  Filled 2015-08-24: qty 2

## 2015-08-24 MED ORDER — INSULIN DETEMIR 100 UNIT/ML ~~LOC~~ SOLN
5.0000 [IU] | Freq: Every day | SUBCUTANEOUS | Status: DC
  Administered 2015-08-24: 23:00:00 5 [IU] via SUBCUTANEOUS
  Filled 2015-08-24 (×2): qty 0.05

## 2015-08-24 MED ORDER — HYDROXYZINE HCL 25 MG PO TABS: 25.0000 mg | ORAL_TABLET | Freq: Three times a day (TID) | ORAL | Status: DC | PRN

## 2015-08-24 NOTE — Plan of Care (Signed)
Problem: Discharge Progression Outcomes Goal: Other Discharge Outcomes/Goals Plan of care progress to goal: Pain: denies pain Hemodynamically: VSS, FSBS 219 Diet: tolerated dinner  Activity: up with assistance

## 2015-08-24 NOTE — Progress Notes (Signed)
TC page returned from Dr. Daria Pastures; reported FSBS 324 with review of dx, procedures, history of FSBS's with IVF therapy, current orders. States she will add order for novolog insulin.

## 2015-08-24 NOTE — Consult Note (Signed)
  GI Inpatient Follow-up Note  Patient Identification: Luke BURBY Sr. is a 69 y.o. male with GI bleeding and liver cirrhosis. Covering for Dr. Vira Agar.  Subjective: No evidence of active bleeding post EGD and cauterizations. C/O diffuse pruritis.  Scheduled Inpatient Medications:  . allopurinol  100 mg Oral BID  . brimonidine-timolol  1 drop Both Eyes BID  . cholecalciferol  1,000 Units Oral Daily  . cholestyramine  4 g Oral TID  . feeding supplement (GLUCERNA SHAKE)  237 mL Oral TID WC  . ferrous sulfate  325 mg Oral TID WC  . furosemide  40 mg Oral Daily  . lactulose  20 g Oral BID  . pantoprazole  40 mg Oral Daily  . polyvinyl alcohol  1 drop Left Eye QID  . rifaximin  550 mg Oral BID  . sertraline  50 mg Oral Daily  . sodium chloride  3 mL Intravenous Q12H    Continuous Inpatient Infusions:     PRN Inpatient Medications:  acetaminophen **OR** [DISCONTINUED] acetaminophen, colchicine, HYDROcodone-acetaminophen, hydrOXYzine, ondansetron **OR** ondansetron (ZOFRAN) IV, sodium chloride  Review of Systems: Constitutional: Weight is stable.  Eyes: No changes in vision.  ENT: No oral lesions, sore throat.  GI: see HPI.  Heme/Lymph: No easy bruising.  CV: No chest pain.  GU: No hematuria.  Integumentary: No rashes.  Neuro: No headaches.  Psych: No depression/anxiety.  Endocrine: No heat/cold intolerance.  Allergic/Immunologic: No urticaria.  Resp: No cough, SOB.  Musculoskeletal: No joint swelling.    Physical Examination: BP 130/54 mmHg  Pulse 75  Temp(Src) 98 F (36.7 C) (Oral)  Resp 18  Ht 5\' 4"  (1.626 m)  Wt 80.831 kg (178 lb 3.2 oz)  BMI 30.57 kg/m2  SpO2 98% Gen: NAD, alert and oriented x 4 HEENT: PEERLA, EOMI, Sclerae icteric. Neck: supple, no JVD or thyromegaly Chest: CTA bilaterally, no wheezes, crackles, or other adventitious sounds CV: RRR, no m/g/c/r Abd: soft, NT, ND, +BS in all four quadrants; no HSM, guarding, ridigity, or rebound  tenderness Ext: no edema, well perfused with 2+ pulses, Skin: no rash or lesions noted. Diffuse jaundice. Lymph: no LAD  Data: Lab Results  Component Value Date   WBC 3.8 08/24/2015   HGB 8.2* 08/24/2015   HCT 23.7* 08/24/2015   MCV 98.1 08/24/2015   PLT 55* 08/24/2015    Recent Labs Lab 08/20/15 1705 08/23/15 0434 08/24/15 0443  HGB 7.7* 7.0* 8.2*   Lab Results  Component Value Date   NA 137 08/20/2015   K 3.7 08/20/2015   CL 108 08/20/2015   CO2 23 08/20/2015   BUN 15 08/20/2015   CREATININE 0.67 08/20/2015   Lab Results  Component Value Date   ALT 29 08/12/2015   AST 67* 08/12/2015   ALKPHOS 125 08/12/2015   BILITOT 7.0* 08/12/2015    Recent Labs Lab 08/20/15 1705  INR 1.84   Assessment/Plan: [name] is a 68 y.o. 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 Jaundice from liver cirrrhosis.  Recommendations: Recheck LFT in AM. Add vistaril prn for itching. Continue to moniter hgb for bleeding. thanks Please call with questions or concerns.  Suleiman Finigan, Lupita Dawn, MD

## 2015-08-24 NOTE — Plan of Care (Signed)
Problem: Discharge Progression Outcomes Goal: Other Discharge Outcomes/Goals Outcome: Progressing 1. Discharge planning in process. 2. Denies pain today. 3. VSS.  FSBS elevated with novolog Insulin SSI started.  4. Color pale/mildly jaundiced with abd grossly distended. Pt had 2 loose stools after lactulose this a.m very dk blackishbrown stool. Napped at long intervals. Generalized weakness. 5. Tolerated diet well. 6. Up to BR with 1+ with slightly unsteady gait. Up in chair with legs elevated.  7. Family in and supportive. Pink foam dsg applied over right forehead hematoma per family request to prevent pt from picking at hematoma.

## 2015-08-24 NOTE — Progress Notes (Signed)
[place] at [place] NAME: [name]    MR#:  [id number]  DATE OF BIRTH:  1946-03-30  SUBJECTIVE: admitted for low hemoglobin count. 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 Patient feels better. Denies any complaints. Patient received 1 unit of packed RBC and platelet transfusion yesterday.   CHIEF COMPLAINT:  No chief complaint on file.   REVIEW OF SYSTEMS:   Review of Systems  Constitutional: Negative for fever and chills.  HENT: Negative for hearing loss.   Eyes: Negative for blurred vision, double vision and photophobia.  Respiratory: Negative for cough, hemoptysis and shortness of breath.   Cardiovascular: Negative for palpitations, orthopnea and leg swelling.  Gastrointestinal: Negative for vomiting, abdominal pain and diarrhea.       Ascites present  Genitourinary: Negative for dysuria and urgency.  Musculoskeletal: Negative for myalgias and neck pain.  Skin: Negative for rash.  Neurological: Negative for dizziness, focal weakness, seizures, weakness and headaches.  Psychiatric/Behavioral: Negative for memory loss. The patient does not have insomnia.    CONSTITUTIONAL: No fever, fatigue or weakness.  EYES: No blurred or double vision.  EARS, NOSE, AND THROAT: No tinnitus or ear pain.  RESPIRATORY: No cough, shortness of breath, wheezing or hemoptysis.  CARDIOVASCULAR: No chest pain, orthopnea, edema.  GASTROINTESTINAL: No nausea, vomiting, diarrhea or abdominal pain.  GENITOURINARY: No dysuria, hematuria.  ENDOCRINE: No polyuria, nocturia,  HEMATOLOGY: No anemia, easy bruising or bleeding SKIN: No rash or lesion. MUSCULOSKELETAL: No joint pain or arthritis.   NEUROLOGIC: No tingling, numbness, weakness.  PSYCHIATRY: No anxiety or depression.   DRUG ALLERGIES:   Allergies  Allergen Reactions  . Actos [Pioglitazone] Anaphylaxis  . Aminophylline Other (See Comments)     Reaction: Unknown  . Amitriptyline Other (See Comments)    Reaction: Unknown   . Ampicillin Hives  . Hctz [Hydrochlorothiazide] Other (See Comments)    Reaction: Unknown   . Naproxen Other (See Comments)    Reaction:  GI bleeding   . Penicillins Rash    VITALS:  Blood pressure 130/54, pulse 75, temperature 98 F (36.7 C), temperature source Oral, resp. rate 18, height 5\' 4"  (1.626 m), weight 80.831 kg (178 lb 3.2 oz), SpO2 98 %.  PHYSICAL EXAMINATION:  GENERAL:  69 y.o.-year-old patient lying in the bed with no acute distress.  EYES: Pupils equal, round, reactive to light and accommodation. Jaundice present Extraocular muscles intact.  HEENT: Head atraumatic, normocephalic. Oropharynx and nasopharynx clear.  NECK:  Supple, no jugular venous distention. No thyroid enlargement, no tenderness.  LUNGS: Normal breath sounds bilaterally, no wheezing, rales,rhonchi or crepitation. No use of accessory muscles of respiration.  CARDIOVASCULAR: S1, S2 normal. No murmurs, rubs, or gallops.  ABDOMEN: Soft, nontender,  Filled  with ascites. EXTREMITIES: No pedal edema, cyanosis, or clubbing.  NEUROLOGIC: Cranial nerves II through XII are intact. Muscle strength 5/5 in all extremities. Sensation intact. Gait not checked.  PSYCHIATRIC: The patient is alert and oriented x 3.  SKIN: No obvious rash, lesion, or ulcer.    LABORATORY PANEL:   CBC  Recent Labs Lab 08/24/15 0443  WBC 3.8  HGB 8.2*  HCT 23.7*  PLT 55*   ------------------------------------------------------------------------------------------------------------------  Chemistries   Recent Labs Lab 08/20/15 1705  NA 137  K 3.7  CL 108  CO2 23  GLUCOSE 95  BUN 15  CREATININE 0.67  CALCIUM 8.4*   ------------------------------------------------------------------------------------------------------------------  Cardiac Enzymes No results for input(s): TROPONINI in  the last 168  hours. ------------------------------------------------------------------------------------------------------------------  RADIOLOGY:  No results found.  EKG:   Orders placed or performed during the hospital encounter of 07/19/15  . ED EKG  . ED EKG  . EKG 12-Lead  . EKG 12-Lead  . EKG    ASSESSMENT AND PLAN:   1. Acute on chronic blood loss anemia: Status post 1 unit of transfusion. 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 Status post coagulation. Hemoglobin is stable this morning. Platelets are up by 55. Continue diet as per GI recommendation for next few days. Continue PPIs.  #2 diabetes mellitus type 2 with a hypoglycemic yesterday.: Resolved. Continue sliding-scale coverage for today. Because of poor by mouth intake. 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 Management plans discussed with the patient, family and they are in agreement.  CODE STATUS: DO NOT RESUSCITATE  TOTAL TIME TAKING CARE OF THIS PATIENT: 35 minutes.   POSSIBLE D/C IN 1-2 DAYS, DEPENDING ON CLINICAL CONDITION.   Epifanio Lesches M.D on 08/24/2015 at 10:06 AM  Between 7am to 6pm - Pager - [phone]  After 6pm go to [email or web address] - password [email or web address] [facility]  [place] [facility]  Office  [phone]  CC: Primary care physician; Adrian Prows, MD

## 2015-08-25 LAB — COMPREHENSIVE METABOLIC PANEL
ALK PHOS: 126 U/L (ref 38–126)
ALT: 34 U/L (ref 17–63)
ANION GAP: 3 — AB (ref 5–15)
AST: 71 U/L — ABNORMAL HIGH (ref 15–41)
Albumin: 2.6 g/dL — ABNORMAL LOW (ref 3.5–5.0)
BILIRUBIN TOTAL: 8.3 mg/dL — AB (ref 0.3–1.2)
BUN: 13 mg/dL (ref 6–20)
CALCIUM: 8.7 mg/dL — AB (ref 8.9–10.3)
CO2: 28 mmol/L (ref 22–32)
Chloride: 105 mmol/L (ref 101–111)
Creatinine, Ser: 0.63 mg/dL (ref 0.61–1.24)
GFR calc non Af Amer: >60 mL/min (ref >60)
Glucose, Bld: 228 mg/dL — ABNORMAL HIGH (ref 65–99)
Potassium: 4.3 mmol/L (ref 3.5–5.1)
Sodium: 136 mmol/L (ref 135–145)
TOTAL PROTEIN: 6.8 g/dL (ref 6.5–8.1)

## 2015-08-25 LAB — GLUCOSE, CAPILLARY
GLUCOSE-CAPILLARY: 314 mg/dL — AB (ref 65–99)
GLUCOSE-CAPILLARY: 282 mg/dL — AB (ref 65–99)
Glucose-Capillary: 196 mg/dL — ABNORMAL HIGH (ref 65–99)
Glucose-Capillary: 319 mg/dL — ABNORMAL HIGH (ref 65–99)

## 2015-08-25 MED ORDER — INSULIN DETEMIR 100 UNIT/ML FLEXPEN: 10.0000 [IU] | Freq: Every day | SUBCUTANEOUS | Status: DC

## 2015-08-25 MED ORDER — INSULIN DETEMIR 100 UNIT/ML ~~LOC~~ SOLN
10.0000 [IU] | Freq: Every day | SUBCUTANEOUS | Status: DC
  Administered 2015-08-25 – 2015-08-26 (×2): 10 [IU] via SUBCUTANEOUS
  Filled 2015-08-25 (×3): qty 0.1

## 2015-08-25 NOTE — Consult Note (Signed)
  GI Inpatient Follow-up Note  Patient Identification: Luke SOLTYS Sr. is a 69 y.o. male  Subjective: No further bleeding. Itching slightly better after starting vistaril. LFT still high. Pt jaundiced.  Scheduled Inpatient Medications:  . allopurinol  100 mg Oral BID  . brimonidine-timolol  1 drop Both Eyes BID  . cholecalciferol  1,000 Units Oral Daily  . cholestyramine  4 g Oral TID  . feeding supplement (GLUCERNA SHAKE)  237 mL Oral TID WC  . ferrous sulfate  325 mg Oral TID WC  . furosemide  40 mg Oral Daily  . insulin aspart  0-5 Units Subcutaneous QHS  . insulin aspart  0-9 Units Subcutaneous TID WC  . insulin detemir  5 Units Subcutaneous QHS  . lactulose  20 g Oral BID  . pantoprazole  40 mg Oral Daily  . polyvinyl alcohol  1 drop Left Eye QID  . rifaximin  550 mg Oral BID  . sertraline  50 mg Oral Daily  . sodium chloride  3 mL Intravenous Q12H    Continuous Inpatient Infusions:     PRN Inpatient Medications:  acetaminophen **OR** [DISCONTINUED] acetaminophen, colchicine, HYDROcodone-acetaminophen, hydrOXYzine, ondansetron **OR** ondansetron (ZOFRAN) IV, sodium chloride  Review of Systems: Constitutional: Weight is stable.  Eyes: No changes in vision. ENT: No oral lesions, sore throat.  GI: see HPI.  Heme/Lymph: No easy bruising.  CV: No chest pain.  GU: No hematuria.  Integumentary: No rashes.  Neuro: No headaches.  Psych: No depression/anxiety.  Endocrine: No heat/cold intolerance.  Allergic/Immunologic: No urticaria.  Resp: No cough, SOB.  Musculoskeletal: No joint swelling.    Physical Examination: BP 143/66 mmHg  Pulse 87  Temp(Src) 98.1 F (36.7 C) (Oral)  Resp 18  Ht 5\' 4"  (1.626 m)  Wt 80.831 kg (178 lb 3.2 oz)  BMI 30.57 kg/m2  SpO2 100% Gen: NAD, alert and oriented x 4; diffuse jaundice. HEENT: PEERLA, EOMI, Neck: supple, no JVD or thyromegaly Chest: CTA bilaterally, no wheezes, crackles, or other adventitious sounds CV: RRR, no  m/g/c/r Abd: soft, NT, ND, +BS in all four quadrants; no HSM, guarding, ridigity, or rebound tenderness Ext: no edema, well perfused with 2+ pulses, Skin: no rash or lesions noted Lymph: no LAD  Data: Lab Results  Component Value Date   WBC 3.8 08/24/2015   HGB 8.2* 08/24/2015   HCT 23.7* 08/24/2015   MCV 98.1 08/24/2015   PLT 55* 08/24/2015    Recent Labs Lab 08/20/15 1705 08/23/15 0434 08/24/15 0443  HGB 7.7* 7.0* 8.2*   Lab Results  Component Value Date   NA 136 08/25/2015   K 4.3 08/25/2015   CL 105 08/25/2015   CO2 28 08/25/2015   BUN 13 08/25/2015   CREATININE 0.63 08/25/2015   Lab Results  Component Value Date   ALT 34 08/25/2015   AST 71* 08/25/2015   ALKPHOS 126 08/25/2015   BILITOT 8.3* 08/25/2015    Recent Labs Lab 08/20/15 1705  INR 1.84   Assessment/Plan: [name] is a 69 y.o. male with UGI bleeding. Stopped post cautery. Jaundice from cirrhosis.  Recommendations: Would like to see gradual improvement of LFT before he is discharged. thanks Please call with questions or concerns.  [name], Lupita Dawn, MD

## 2015-08-25 NOTE — Plan of Care (Signed)
Problem: Discharge Progression Outcomes Goal: Other Discharge Outcomes/Goals Outcome: Progressing VSS. Last Hgb 8.2, platelets 55. No bleeding noted during the shift. Pt had brown stool today. Tolerates diet. Denies pain/n/v.

## 2015-08-25 NOTE — Progress Notes (Signed)
[place] at [place] NAME: [name]    MR#:  [id number]  DATE OF BIRTH:  Aug 21, 1946  SUBJECTIVE: admitted for low hemoglobin count. 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 Patient feels better. Denies any complaints. Patient received 1 unit of packed RBC and platelet transfusion . Denies any complaints...   CHIEF COMPLAINT:  No chief complaint on file.   REVIEW OF SYSTEMS:   Review of Systems  Constitutional: Negative for fever and chills.  HENT: Negative for hearing loss.   Eyes: Negative for blurred vision, double vision and photophobia.  Respiratory: Negative for cough, hemoptysis and shortness of breath.   Cardiovascular: Negative for palpitations, orthopnea and leg swelling.  Gastrointestinal: Negative for vomiting, abdominal pain and diarrhea.       Ascites present  Genitourinary: Negative for dysuria and urgency.  Musculoskeletal: Negative for myalgias and neck pain.  Skin: Negative for rash.  Neurological: Negative for dizziness, focal weakness, seizures, weakness and headaches.  Psychiatric/Behavioral: Negative for memory loss. The patient does not have insomnia.    CONSTITUTIONAL: No fever, fatigue or weakness.  EYES: No blurred or double vision.  EARS, NOSE, AND THROAT: No tinnitus or ear pain.  RESPIRATORY: No cough, shortness of breath, wheezing or hemoptysis.  CARDIOVASCULAR: No chest pain, orthopnea, edema.  GASTROINTESTINAL: No nausea, vomiting, diarrhea or abdominal pain.  GENITOURINARY: No dysuria, hematuria.  ENDOCRINE: No polyuria, nocturia,  HEMATOLOGY: No anemia, easy bruising or bleeding SKIN: No rash or lesion. MUSCULOSKELETAL: No joint pain or arthritis.   NEUROLOGIC: No tingling, numbness, weakness.  PSYCHIATRY: No anxiety or depression.   DRUG ALLERGIES:   Allergies  Allergen Reactions  . Actos [Pioglitazone] Anaphylaxis  . Aminophylline Other (See  Comments)    Reaction: Unknown  . Amitriptyline Other (See Comments)    Reaction: Unknown   . Ampicillin Hives  . Hctz [Hydrochlorothiazide] Other (See Comments)    Reaction: Unknown   . Naproxen Other (See Comments)    Reaction:  GI bleeding   . Penicillins Rash    VITALS:  Blood pressure 143/54, pulse 80, temperature 98.1 F (36.7 C), temperature source Oral, resp. rate 18, height 5\' 4"  (1.626 m), weight 80.831 kg (178 lb 3.2 oz), SpO2 100 %.  PHYSICAL EXAMINATION:  GENERAL:  69 y.o.-year-old patient lying in the bed with no acute distress.  EYES: Pupils equal, round, reactive to light and accommodation. Jaundice present. Extraocular muscles intact.  HEENT: Head atraumatic, normocephalic. Oropharynx and nasopharynx clear.  NECK:  Supple, no jugular venous distention. No thyroid enlargement, no tenderness.  LUNGS: Normal breath sounds bilaterally, no wheezing, rales,rhonchi or crepitation. No use of accessory muscles of respiration.  CARDIOVASCULAR: S1, S2 normal. No murmurs, rubs, or gallops.  ABDOMEN: Soft, nontender,  Filled  with ascites. EXTREMITIES: No pedal edema, cyanosis, or clubbing.  NEUROLOGIC: Cranial nerves II through XII are intact. Muscle strength 5/5 in all extremities. Sensation intact. Gait not checked.  PSYCHIATRIC: The patient is alert and oriented x 3.  SKIN: No obvious rash, lesion, or ulcer.    LABORATORY PANEL:   CBC  Recent Labs Lab 08/24/15 0443  WBC 3.8  HGB 8.2*  HCT 23.7*  PLT 55*   ------------------------------------------------------------------------------------------------------------------  Chemistries   Recent Labs Lab 08/25/15 0540  NA 136  K 4.3  CL 105  CO2 28  GLUCOSE 228*  BUN 13  CREATININE 0.63  CALCIUM 8.7*  AST 71*  ALT 34  ALKPHOS 126  BILITOT 8.3*   ------------------------------------------------------------------------------------------------------------------  Cardiac Enzymes No results for input(s):  TROPONINI in the last 168 hours. ------------------------------------------------------------------------------------------------------------------  RADIOLOGY:  No results found.  EKG:   Orders placed or performed during the hospital encounter of 07/19/15  . ED EKG  . ED EKG  . EKG 12-Lead  . EKG 12-Lead  . EKG    ASSESSMENT AND PLAN:   1. Acute on chronic blood loss anemia: Status post 1 unit of transfusion. 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 Status post cauterization. Hemoglobin is stabl. Platelets are up by 55. Continue ultra soft  diet, PPIs. 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  5. Ascites, needs to have therapeutic paracentesis by IR  Before discharge  All the records are reviewed and case discussed with Care Management/Social Workerr. Management plans discussed with the patient, family and they are in agreement.  CODE STATUS: DO NOT RESUSCITATE  TOTAL TIME TAKING CARE OF THIS PATIENT: 35 minutes.   POSSIBLE D/C IN 1-2 DAYS, DEPENDING ON CLINICAL CONDITION.   Epifanio Lesches M.D on 08/25/2015 at 12:02 PM  Between 7am to 6pm - Pager - [phone]  After 6pm go to [email or web address] - password [email or web address] [facility]  [place] [facility]  Office  [phone]  CC: Primary care physician; Adrian Prows, MD

## 2015-08-25 NOTE — Plan of Care (Signed)
Problem: Discharge Progression Outcomes Goal: Other Discharge Outcomes/Goals Outcome: Progressing Plan of care progress to goals: Discharge plan- discharge plan in progress. Pain-no c/o pain this shift. Hemodynamically stable- VSS Complicatons controlled-FSBS 273, SSI coverage 3units given per order and 5 units of Lantus per MD order.  Diet- tolerating diet, no c/o nausea or vomiting this shift. Activity- pt needs assistance using urinal, unsteady gait.   A&O pt, slow to respond. No c/o pain this shift. Voids using urinal. Abdomen taut and disteneded. Large hematoma right frontal forehead covered with pink foam. High fall risk with bed alarm activated.

## 2015-08-26 ENCOUNTER — Encounter: Payer: Self-pay | Admitting: Gastroenterology

## 2015-08-26 LAB — TYPE AND SCREEN
ABO/RH(D): AB POS
Antibody Screen: NEGATIVE
UNIT DIVISION: 0
UNIT DIVISION: 0
Unit division: 0

## 2015-08-26 LAB — COMPREHENSIVE METABOLIC PANEL
ALBUMIN: 2.6 g/dL — AB (ref 3.5–5.0)
ALK PHOS: 124 U/L (ref 38–126)
ALT: 33 U/L (ref 17–63)
AST: 68 U/L — AB (ref 15–41)
Anion gap: 4 — ABNORMAL LOW (ref 5–15)
BUN: 15 mg/dL (ref 6–20)
CALCIUM: 8.4 mg/dL — AB (ref 8.9–10.3)
CO2: 24 mmol/L (ref 22–32)
CREATININE: 0.68 mg/dL (ref 0.61–1.24)
Chloride: 103 mmol/L (ref 101–111)
GFR calc Af Amer: >60 mL/min (ref >60)
GFR calc non Af Amer: >60 mL/min (ref >60)
GLUCOSE: 267 mg/dL — AB (ref 65–99)
Potassium: 4.8 mmol/L (ref 3.5–5.1)
SODIUM: 131 mmol/L — AB (ref 135–145)
Total Bilirubin: 8.5 mg/dL — ABNORMAL HIGH (ref 0.3–1.2)
Total Protein: 6.9 g/dL (ref 6.5–8.1)

## 2015-08-26 LAB — GLUCOSE, CAPILLARY
GLUCOSE-CAPILLARY: 344 mg/dL — AB (ref 65–99)
Glucose-Capillary: 139 mg/dL — ABNORMAL HIGH (ref 65–99)
Glucose-Capillary: 270 mg/dL — ABNORMAL HIGH (ref 65–99)
Glucose-Capillary: 331 mg/dL — ABNORMAL HIGH (ref 65–99)

## 2015-08-26 NOTE — Care Management Important Message (Signed)
Important Message  Patient Details  Name: Luke BOSSERMAN Sr. MRN: [id number] Date of Birth: 10/18/1946   Medicare Important Message Given:       Shelbie Ammons, RN 08/26/2015, 9:03 AM

## 2015-08-26 NOTE — Evaluation (Signed)
Physical Therapy Evaluation Patient Details Name: BREVIN MCFADDEN Sr. MRN: [id number] DOB: 1946-03-19 Today's Date: 08/26/2015   History of Present Illness  Rorey Bisson  is a 69 y.o.male with a known history of recurrent GI bleeding, hyperlipidemia, diabetes insulin requiring, COPD, gout, cirrhosis secondary to NASH, diabetic retinopathy comes from Dr. Blane Ohara office as a direct admit for low hemoglobin count. Patient was recently discharged from [facility] after he had a subdural hematoma requiring 1 unit of blood transfusion. Pt went for follow-up with PCP was found to have hemoglobin of 7.7. He denies any complaints at present. Does have melanotic stools. He is on iron supplement. Denies any coffee-ground emesis or any bright red blood per rectum. Denies any abdominal pain. Denies any fever. Pt had colonoscopy 07/01/2012 which showed 5 small polyps. He had an EGD 07/01/2012 that showed gastritis. He had a normal capsule endoscopy 03/28/2013. He had an EGD noting AVMs of the stomach 12/27/2014, and another EGD on 07/20/2015 revealing only mild gastritis w/ a benign gastric polyp. Pt reports that at baseline he ambulates at home with rolling walker. Pt endorses at least 3 falls in the last 12 months. Pt has 24/7 assistance from his wife and has [place] aid 3 days/week.   Clinical Impression  Pt demonstrates sufficient bed mobility, transfers, and ambulation safety to return home with [facility] PT. His balance is considerably impaired but he is safe and is able to self-correct with UE support from rolling walker. Pt reports that [facility] PT never made contact after his last admission at [facility]. Would recommend HH PT due to generalized weakness as well as balance impairments which place pt at risk for future falls. Pt will benefit from skilled PT services to address deficits in strength, balance, and mobility in order to return to full function at home.      Follow Up Recommendations Home health PT;Supervision for  mobility/OOB    Equipment Recommendations  None recommended by PT    Recommendations for Other Services       Precautions / Restrictions Precautions Precautions: Fall Restrictions Weight Bearing Restrictions: No      Mobility  Bed Mobility Overal bed mobility: Needs Assistance Bed Mobility: Rolling;Sidelying to Sit Rolling: Supervision Sidelying to sit: Supervision       General bed mobility comments: No physical assist required but increased time required to perform  Transfers Overall transfer level: Needs assistance Equipment used: Rolling walker (2 wheeled) Transfers: Sit to/from Stand Sit to Stand: Supervision         General transfer comment: No cues for hand placement required  Ambulation/Gait Ambulation/Gait assistance: Min guard Ambulation Distance (Feet): 250 Feet Assistive device: Rolling walker (2 wheeled) Gait Pattern/deviations: Step-through pattern;Decreased step length - right;Decreased step length - left   Gait velocity interpretation: <1.8 ft/sec, indicative of risk for recurrent falls General Gait Details: Pt tends to walk to the R side of his walker and also drifts to the left. Requires cues and tactile input to correct. At times pt staggers but is able to self-correct with UE support  Stairs            Wheelchair Mobility    Modified Rankin (Stroke Patients Only)       Balance Overall balance assessment: Needs assistance   Sitting balance-Leahy Scale: Fair       Standing balance-Leahy Scale: Fair                 High Level Balance Comments: Negative [place]. Unable to  attempt single leg stance without LOB             Pertinent Vitals/Pain Pain Assessment: No/denies pain    Home Living Family/patient expects to be discharged to:: Private residence Living Arrangements: Spouse/significant other Available Help at Discharge: Family;Available 24 hours/day Type of Home: House (Duplex) Home Access: Level  entry;Ramped entrance     Home Layout: One level Home Equipment: Walker - 2 wheels;Cane - single point;Grab bars - toilet;Grab bars - tub/shower;Hospital bed;Bedside commode;Shower seat      Prior Function Level of Independence: Needs assistance   Gait / Transfers Assistance Needed: Independent with rolling walker  ADL's / Homemaking Assistance Needed: Pt reports his wife assists with bathinga nd dressing        Hand Dominance   Dominant Hand: Right    Extremity/Trunk Assessment   Upper Extremity Assessment: Overall WFL for tasks assessed           Lower Extremity Assessment: Overall WFL for tasks assessed      Cervical / Trunk Assessment: Normal  Communication   Communication: No difficulties  Cognition Arousal/Alertness: Awake/alert Behavior During Therapy: WFL for tasks assessed/performed Overall Cognitive Status: Within Functional Limits for tasks assessed                      General Comments      Exercises        Assessment/Plan    PT Assessment Patient needs continued PT services  PT Diagnosis Difficulty walking;Generalized weakness   PT Problem List Decreased strength;Decreased mobility;Decreased balance;Decreased safety awareness  PT Treatment Interventions Gait training;DME instruction;Functional mobility training;Therapeutic activities;Patient/family education;Therapeutic exercise;Balance training;Neuromuscular re-education   PT Goals (Current goals can be found in the Care Plan section) Acute Rehab PT Goals Patient Stated Goal: "I want to go home" PT Goal Formulation: With patient Time For Goal Achievement: 09/09/15 Potential to Achieve Goals: Good    Frequency Min 2X/week   Barriers to discharge        Co-evaluation               End of Session Equipment Utilized During Treatment: Gait belt Activity Tolerance: Patient tolerated treatment well Patient left: with bed alarm set;with call bell/phone within reach;in bed  (Eating dinner)           Time: 3300-7622 PT Time Calculation (min) (ACUTE ONLY): 15 min   Charges:   PT Evaluation $Initial PT Evaluation Tier I: 1 Procedure     PT G Codes:       Lyndel Safe Huprich PT, DPT   Huprich,Jason 08/26/2015, 4:58 PM

## 2015-08-26 NOTE — Consult Note (Signed)
  If pt is discharged, make sure pt follows up with Dr. Vira Agar. thanks

## 2015-08-26 NOTE — Plan of Care (Signed)
Problem: Discharge Progression Outcomes Goal: Other Discharge Outcomes/Goals Outcome: Progressing Plan of care progress to goals:  Discharge plan- discharge plan in progress. Hemodynamically stable- VSS, no bleeding noted this shift. Complicatons controlled-Elevated FSBS. Covered with SSI.  Diet-Soft Diet. Tolerating diet. No c/o nausea or vomiting. Drinks 100% of Glucerna. Activity- Needs assistance using urinal. Unsteady gait.

## 2015-08-26 NOTE — Plan of Care (Signed)
Problem: Discharge Progression Outcomes Goal: Other Discharge Outcomes/Goals Outcome: Progressing Plan of care progress to goals: Discharge plan- discharge plan in progress. Hemodynamically stable- VSS, no bleeding noted this shift. Complicatons controlled-FSBS 319, SSI coverage 4units given per order and 10 units of Levemir per MD order.   Diet- tolerating diet, no c/o nausea or vomiting this shift. Activity- pt needs assistance using urinal, unsteady gait.   A&O pt, slow to respond. No c/o pain this shift. Voids using urinal. Abdomen taut and disteneded, no bm this shift and no bleeding noted. Large hematoma right frontal forehead covered with pink foam. High fall risk with bed alarm activated.

## 2015-08-26 NOTE — Consult Note (Signed)
  GI Inpatient Follow-up Note  Patient Identification: Luke SWAIM Sr. is a 69 y.o. male with GI bleeding hx and jaundice.  Subjective: Feels better. Less pruritis. Looks better.  Scheduled Inpatient Medications:  . allopurinol  100 mg Oral BID  . brimonidine-timolol  1 drop Both Eyes BID  . cholecalciferol  1,000 Units Oral Daily  . cholestyramine  4 g Oral TID  . feeding supplement (GLUCERNA SHAKE)  237 mL Oral TID WC  . ferrous sulfate  325 mg Oral TID WC  . furosemide  40 mg Oral Daily  . insulin aspart  0-5 Units Subcutaneous QHS  . insulin aspart  0-9 Units Subcutaneous TID WC  . insulin detemir  10 Units Subcutaneous QHS  . lactulose  20 g Oral BID  . pantoprazole  40 mg Oral Daily  . polyvinyl alcohol  1 drop Left Eye QID  . rifaximin  550 mg Oral BID  . sertraline  50 mg Oral Daily  . sodium chloride  3 mL Intravenous Q12H    Continuous Inpatient Infusions:     PRN Inpatient Medications:  acetaminophen **OR** [DISCONTINUED] acetaminophen, colchicine, HYDROcodone-acetaminophen, hydrOXYzine, ondansetron **OR** ondansetron (ZOFRAN) IV, sodium chloride  Review of Systems: Constitutional: Weight is stable.  Eyes: No changes in vision. ENT: No oral lesions, sore throat.  GI: see HPI.  Heme/Lymph: No easy bruising.  CV: No chest pain.  GU: No hematuria.  Integumentary: No rashes.  Neuro: No headaches.  Psych: No depression/anxiety.  Endocrine: No heat/cold intolerance.  Allergic/Immunologic: No urticaria.  Resp: No cough, SOB.  Musculoskeletal: No joint swelling.    Physical Examination: BP 129/54 mmHg  Pulse 71  Temp(Src) 97.9 F (36.6 C) (Oral)  Resp 18  Ht 5\' 4"  (1.626 m)  Wt 80.831 kg (178 lb 3.2 oz)  BMI 30.57 kg/m2  SpO2 100% Gen: NAD, alert and oriented x 4. Less jaundice. HEENT: PEERLA, EOMI, Neck: supple, no JVD or thyromegaly Chest: CTA bilaterally, no wheezes, crackles, or other adventitious sounds CV: RRR, no m/g/c/r Abd: soft, NT, ND,  +BS in all four quadrants; no HSM, guarding, ridigity, or rebound tenderness Ext: no edema, well perfused with 2+ pulses, Skin: no rash or lesions noted Lymph: no LAD  Data: Lab Results  Component Value Date   WBC 3.8 08/24/2015   HGB 8.2* 08/24/2015   HCT 23.7* 08/24/2015   MCV 98.1 08/24/2015   PLT 55* 08/24/2015    Recent Labs Lab 08/20/15 1705 08/23/15 0434 08/24/15 0443  HGB 7.7* 7.0* 8.2*   Lab Results  Component Value Date   NA 136 08/25/2015   K 4.3 08/25/2015   CL 105 08/25/2015   CO2 28 08/25/2015   BUN 13 08/25/2015   CREATININE 0.63 08/25/2015   Lab Results  Component Value Date   ALT 34 08/25/2015   AST 71* 08/25/2015   ALKPHOS 126 08/25/2015   BILITOT 8.3* 08/25/2015    Recent Labs Lab 08/20/15 1705  INR 1.84   Assessment/Plan: Mr. Speir is a 69 y.o. male with GI bleeding, which has since stopped. Jaundice from liver cirrhosis.  Recommendations: Recheck LFT today. IF trending down, ok for discharge later today with vistaril for itching. If LFT higher, then Dr. Vira Agar will see pt again tomorrow. Thanks. Please call with questions or concerns.  Rania Prothero, Lupita Dawn, MD

## 2015-08-26 NOTE — Progress Notes (Signed)
[name] at [place] NAME: [name]    MR#:  [id number]  DATE OF BIRTH:  [birth year]  SUBJECTIVE: admitted for low hemoglobin count. 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 Patient feels better. Denies any complaints. Patient received 1 unit of packed RBC and platelet transfusion . Patient has jaundice but denies any other complaints.   CHIEF COMPLAINT:  No chief complaint on file.   REVIEW OF SYSTEMS:   Review of Systems  Constitutional: Negative for fever and chills.  HENT: Negative for hearing loss.   Eyes: Negative for blurred vision, double vision and photophobia.  Respiratory: Negative for cough, hemoptysis and shortness of breath.   Cardiovascular: Negative for palpitations, orthopnea and leg swelling.  Gastrointestinal: Negative for vomiting, abdominal pain and diarrhea.       Ascites present  Genitourinary: Negative for dysuria and urgency.  Musculoskeletal: Negative for myalgias and neck pain.  Skin: Negative for rash.  Neurological: Negative for dizziness, focal weakness, seizures, weakness and headaches.  Psychiatric/Behavioral: Negative for memory loss. The patient does not have insomnia.    CONSTITUTIONAL: No fever, fatigue or weakness.  EYES: No blurred or double vision.  EARS, NOSE, AND THROAT: No tinnitus or ear pain.  RESPIRATORY: No cough, shortness of breath, wheezing or hemoptysis.  CARDIOVASCULAR: No chest pain, orthopnea, edema.  GASTROINTESTINAL: No nausea, vomiting, diarrhea or abdominal pain.  GENITOURINARY: No dysuria, hematuria.  ENDOCRINE: No polyuria, nocturia,  HEMATOLOGY: No anemia, easy bruising or bleeding SKIN: No rash or lesion. MUSCULOSKELETAL: No joint pain or arthritis.   NEUROLOGIC: No tingling, numbness, weakness.  PSYCHIATRY: No anxiety or depression.   DRUG ALLERGIES:   Allergies  Allergen Reactions  . Actos [Pioglitazone] Anaphylaxis  .  Aminophylline Other (See Comments)    Reaction: Unknown  . Amitriptyline Other (See Comments)    Reaction: Unknown   . Ampicillin Hives  . Hctz [Hydrochlorothiazide] Other (See Comments)    Reaction: Unknown   . Naproxen Other (See Comments)    Reaction:  GI bleeding   . Penicillins Rash    VITALS:  Blood pressure 129/54, pulse 71, temperature 97.9 F (36.6 C), temperature source Oral, resp. rate 18, height 5\' 4"  (1.626 m), weight 80.831 kg (178 lb 3.2 oz), SpO2 100 %.  PHYSICAL EXAMINATION:  GENERAL:  69 y.o.-year-old patient lying in the bed with no acute distress.  EYES: Pupils equal, round, reactive to light and accommodation. Jaundice present. Extraocular muscles intact.  HEENT: Head atraumatic, normocephalic. Oropharynx and nasopharynx clear.  NECK:  Supple, no jugular venous distention. No thyroid enlargement, no tenderness.  LUNGS: Normal breath sounds bilaterally, no wheezing, rales,rhonchi or crepitation. No use of accessory muscles of respiration.  CARDIOVASCULAR: S1, S2 normal. No murmurs, rubs, or gallops.  ABDOMEN: Soft, nontender,  Filled  with ascites. EXTREMITIES: No pedal edema, cyanosis, or clubbing.  NEUROLOGIC: Cranial nerves II through XII are intact. Muscle strength 5/5 in all extremities. Sensation intact. Gait not checked.  PSYCHIATRIC: The patient is alert and oriented x 3.  SKIN: No obvious rash, lesion, or ulcer.    LABORATORY PANEL:   CBC  Recent Labs Lab 08/24/15 0443  WBC 3.8  HGB 8.2*  HCT 23.7*  PLT 55*   ------------------------------------------------------------------------------------------------------------------  Chemistries   Recent Labs Lab 08/25/15 0540  NA 136  K 4.3  CL 105  CO2 28  GLUCOSE 228*  BUN 13  CREATININE 0.63  CALCIUM 8.7*  AST 71*  ALT 34  ALKPHOS 126  BILITOT 8.3*   ------------------------------------------------------------------------------------------------------------------  Cardiac  Enzymes No results for input(s): TROPONINI in the last 168 hours. ------------------------------------------------------------------------------------------------------------------  RADIOLOGY:  No results found.  EKG:   Orders placed or performed during the hospital encounter of 07/19/15  . ED EKG  . ED EKG  . EKG 12-Lead  . EKG 12-Lead  . EKG    ASSESSMENT AND PLAN:   1. Acute on chronic blood loss anemia: Status post 1 unit of transfusion. 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 Status post cauterization. Hemoglobin is stabl. Platelets are up by 55. Continue ultra soft  diet, PPIs., GI follow-up is appreciated. #2 diabetes mellitus type 2 resume  Levemir, mealtime NovoLog, blood glucose is better this morning. #3 cirrhosis secondary to NASH; Continue rifaximin, lactulose , continue Lasix 4 jaundice secondary to cirrhosis: LFTs are slightly better today. Follow clinically. 5. Ascites, needs to have therapeutic paracentesis by IR  Before discharge  All the records are reviewed and case discussed with Care Management/Social Workerr. Management plans discussed with the patient, family and they are in agreement.  CODE STATUS: DO NOT RESUSCITATE  TOTAL TIME TAKING CARE OF THIS PATIENT: 35 minutes.   POSSIBLE D/C IN 1-2 DAYS, DEPENDING ON CLINICAL CONDITION.   Epifanio Lesches M.D on 08/26/2015 at 10:06 AM  Between 7am to 6pm - Pager - [phone]  After 6pm go to [email or web address] - password [email or web address] [facility]  [place] [facility]  Office  [phone]  CC: Primary care physician; Adrian Prows, MD

## 2015-08-27 ENCOUNTER — Encounter: Payer: Self-pay | Admitting: Unknown Physician Specialty

## 2015-08-27 LAB — CBC WITH DIFFERENTIAL/PLATELET
BASOS ABS: 0.1 10*3/uL (ref 0–0.1)
Basophils Relative: 1 %
Eosinophils Absolute: 0.2 10*3/uL (ref 0–0.7)
HEMATOCRIT: 25.3 % — AB (ref 40.0–52.0)
Hemoglobin: 8.4 g/dL — ABNORMAL LOW (ref 13.0–18.0)
LYMPHS ABS: 0.4 10*3/uL — AB (ref 1.0–3.6)
MCH: 33.2 pg (ref 26.0–34.0)
MCHC: 33.3 g/dL (ref 32.0–36.0)
MCV: 99.8 fL (ref 80.0–100.0)
MONO ABS: 0.3 10*3/uL (ref 0.2–1.0)
Monocytes Relative: 8 %
NEUTROS ABS: 2.9 10*3/uL (ref 1.4–6.5)
Neutrophils Relative %: 75 %
PLATELETS: 51 10*3/uL — AB (ref 150–440)
RBC: 2.54 MIL/uL — AB (ref 4.40–5.90)
RDW: 23.9 % — AB (ref 11.5–14.5)
WBC: 3.9 10*3/uL (ref 3.8–10.6)

## 2015-08-27 LAB — COMPREHENSIVE METABOLIC PANEL
ALBUMIN: 2.8 g/dL — AB (ref 3.5–5.0)
ALK PHOS: 133 U/L — AB (ref 38–126)
ALT: 35 U/L (ref 17–63)
AST: 74 U/L — AB (ref 15–41)
Anion gap: 3 — ABNORMAL LOW (ref 5–15)
BILIRUBIN TOTAL: 9 mg/dL — AB (ref 0.3–1.2)
BUN: 14 mg/dL (ref 6–20)
CO2: 28 mmol/L (ref 22–32)
CREATININE: 0.62 mg/dL (ref 0.61–1.24)
Calcium: 8.7 mg/dL — ABNORMAL LOW (ref 8.9–10.3)
Chloride: 102 mmol/L (ref 101–111)
GFR calc Af Amer: >60 mL/min (ref >60)
GLUCOSE: 219 mg/dL — AB (ref 65–99)
Potassium: 4.6 mmol/L (ref 3.5–5.1)
Sodium: 133 mmol/L — ABNORMAL LOW (ref 135–145)
TOTAL PROTEIN: 7.4 g/dL (ref 6.5–8.1)

## 2015-08-27 LAB — GLUCOSE, CAPILLARY
GLUCOSE-CAPILLARY: 319 mg/dL — AB (ref 65–99)
Glucose-Capillary: 175 mg/dL — ABNORMAL HIGH (ref 65–99)
Glucose-Capillary: 333 mg/dL — ABNORMAL HIGH (ref 65–99)
Glucose-Capillary: 323 mg/dL — ABNORMAL HIGH (ref 65–99)

## 2015-08-27 MED ORDER — INSULIN DETEMIR 100 UNIT/ML ~~LOC~~ SOLN
15.0000 [IU] | Freq: Every day | SUBCUTANEOUS | Status: DC
  Administered 2015-08-27 – 2015-08-29 (×3): 15 [IU] via SUBCUTANEOUS
  Filled 2015-08-27 (×5): qty 0.15

## 2015-08-27 MED ORDER — INSULIN ASPART 100 UNIT/ML ~~LOC~~ SOLN
10.0000 [IU] | Freq: Three times a day (TID) | SUBCUTANEOUS | Status: DC
  Administered 2015-08-27 – 2015-08-30 (×6): 10 [IU] via SUBCUTANEOUS
  Filled 2015-08-27 (×6): qty 10

## 2015-08-27 MED ORDER — INSULIN DETEMIR 100 UNIT/ML ~~LOC~~ SOLN: 10.0000 [IU] | Freq: Every day | SUBCUTANEOUS | Status: DC

## 2015-08-27 NOTE — Progress Notes (Signed)
[place] at [place] NAME: [name]    MR#:  [id number]  DATE OF BIRTH:  [birth year]  SUBJECTIVE: admitted for low hemoglobin count. 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 Patient feels better. Denies any complaints. Patient received 1 unit of packed RBC and platelet transfusion . Confused this am,   CHIEF COMPLAINT:  No chief complaint on file.   REVIEW OF SYSTEMS:   Review of Systems  Constitutional: Negative for fever and chills.  HENT: Negative for hearing loss.   Eyes: Negative for blurred vision, double vision and photophobia.  Respiratory: Negative for cough, hemoptysis and shortness of breath.   Cardiovascular: Negative for palpitations, orthopnea and leg swelling.  Gastrointestinal: Negative for vomiting, abdominal pain and diarrhea.       Ascites present  Genitourinary: Negative for dysuria and urgency.  Musculoskeletal: Negative for myalgias and neck pain.  Skin: Negative for rash.  Neurological: Negative for dizziness, focal weakness, seizures, weakness and headaches.  Psychiatric/Behavioral: Negative for memory loss. The patient does not have insomnia.    CONSTITUTIONAL: No fever, fatigue or weakness.  EYES: No blurred or double vision.  EARS, NOSE, AND THROAT: No tinnitus or ear pain.  RESPIRATORY: No cough, shortness of breath, wheezing or hemoptysis.  CARDIOVASCULAR: No chest pain, orthopnea, edema.  GASTROINTESTINAL: No nausea, vomiting, diarrhea or abdominal pain.  GENITOURINARY: No dysuria, hematuria.  ENDOCRINE: No polyuria, nocturia,  HEMATOLOGY: No anemia, easy bruising or bleeding SKIN: No rash or lesion. MUSCULOSKELETAL: No joint pain or arthritis.   NEUROLOGIC: No tingling, numbness, weakness.  PSYCHIATRY: No anxiety or depression.   DRUG ALLERGIES:   Allergies  Allergen Reactions  . Actos [Pioglitazone] Anaphylaxis  . Aminophylline Other (See Comments)     Reaction: Unknown  . Amitriptyline Other (See Comments)    Reaction: Unknown   . Ampicillin Hives  . Hctz [Hydrochlorothiazide] Other (See Comments)    Reaction: Unknown   . Naproxen Other (See Comments)    Reaction:  GI bleeding   . Penicillins Rash    VITALS:  Blood pressure 136/55, pulse 81, temperature 98.2 F (36.8 C), temperature source Oral, resp. rate 18, height 5\' 4"  (1.626 m), weight 80.831 kg (178 lb 3.2 oz), SpO2 100 %.  PHYSICAL EXAMINATION:  GENERAL:  69 y.o.-year-old patient lying in the bed with no acute distress.  EYES: Pupils equal, round, reactive to light and accommodation. Jaundice present. Extraocular muscles intact.  HEENT: Head atraumatic, normocephalic. Oropharynx and nasopharynx clear.  NECK:  Supple, no jugular venous distention. No thyroid enlargement, no tenderness.  LUNGS: Normal breath sounds bilaterally, no wheezing, rales,rhonchi or crepitation. No use of accessory muscles of respiration.  CARDIOVASCULAR: S1, S2 normal. No murmurs, rubs, or gallops.  ABDOMEN: Soft, nontender,  Filled  with ascites. EXTREMITIES: No pedal edema, cyanosis, or clubbing.  NEUROLOGIC: Cranial nerves II through XII are intact. Muscle strength 5/5 in all extremities. Sensation intact. Gait not checked.  PSYCHIATRIC: The patient is alert and oriented x 3.  SKIN: No obvious rash, lesion, or ulcer.    LABORATORY PANEL:   CBC  Recent Labs Lab 08/24/15 0443  WBC 3.8  HGB 8.2*  HCT 23.7*  PLT 55*   ------------------------------------------------------------------------------------------------------------------  Chemistries   Recent Labs Lab 08/27/15 0903  NA 133*  K 4.6  CL 102  CO2 28  GLUCOSE 219*  BUN 14  CREATININE 0.62  CALCIUM 8.7*  AST 74*  ALT 35  ALKPHOS 133*  BILITOT 9.0*   ------------------------------------------------------------------------------------------------------------------  Cardiac Enzymes No results for input(s):  TROPONINI in the last 168 hours. ------------------------------------------------------------------------------------------------------------------  RADIOLOGY:  No results found.  EKG:   Orders placed or performed during the hospital encounter of 07/19/15  . ED EKG  . ED EKG  . EKG 12-Lead  . EKG 12-Lead  . EKG    ASSESSMENT AND PLAN:   1. Acute on chronic blood loss anemia: Status post 1 unit of transfusion. 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 Status post cauterization. Hemoglobin is stabl. Platelets are up by 55. Continue ultra soft  diet, PPIs., GI follow-up is appreciated. 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  4. jaundice secondary to cirrhosis: worsening jaundice;GI following ,if GI recommends discharge, will d/c home,DR.Elliott  To follow.I called DR.OH. 5. Ascites, due to cirrhosis;on lasix,add aldactone 100 mg po daily  All the records are reviewed and case discussed with Care Management/Social Workerr. Management plans discussed with the patient, family and they are in agreement.  CODE STATUS: DO NOT RESUSCITATE  TOTAL TIME TAKING CARE OF THIS PATIENT: 35 minutes.   POSSIBLE D/C IN 1-2 DAYS, DEPENDING ON CLINICAL CONDITION.   Epifanio Lesches M.D on 08/27/2015 at 12:26 PM  Between 7am to 6pm - Pager - [phone]  After 6pm go to [email or web address] - password [email or web address] [facility]  [place] [facility]  Office  [phone]  CC: Primary care physician; Adrian Prows, MD

## 2015-08-27 NOTE — Care Management Important Message (Signed)
Important Message  Patient Details  Name: Luke PADIN Sr. MRN: [id number] Date of Birth: 1946/09/29   Medicare Important Message Given:  Yes-second notification given    [name] 08/27/2015, 10:46 AM

## 2015-08-27 NOTE — Consult Note (Signed)
GI Inpatient Follow-up Note  Patient Identification: Luke COTRONEO Sr. is a 69 y.o. male begin treated for melena and suspected upper GI bleed  Subjective: Patient reports that he has been eating fine, denies abdominal pain.  He reports 2 episodes of diarrhea this morning.  He had an upper endoscopy completed on August 23, 2015. Dr. Vira Agar.  Indications; melena,  Suspected upper gastrointestinal bleeding.  Impression; normal esophagus.  Gastric antral vascular ectasia without bleeding.  Treated with argon plasma coagulation.  No specimens collected.   Scheduled Inpatient Medications:  . allopurinol  100 mg Oral BID  . brimonidine-timolol  1 drop Both Eyes BID  . cholecalciferol  1,000 Units Oral Daily  . cholestyramine  4 g Oral TID  . feeding supplement (GLUCERNA SHAKE)  237 mL Oral TID WC  . ferrous sulfate  325 mg Oral TID WC  . furosemide  40 mg Oral Daily  . insulin aspart  0-5 Units Subcutaneous QHS  . insulin aspart  0-9 Units Subcutaneous TID WC  . insulin aspart  10 Units Subcutaneous TID WC  . insulin detemir  15 Units Subcutaneous QHS  . lactulose  20 g Oral BID  . pantoprazole  40 mg Oral Daily  . polyvinyl alcohol  1 drop Left Eye QID  . rifaximin  550 mg Oral BID  . sertraline  50 mg Oral Daily  . sodium chloride  3 mL Intravenous Q12H    Continuous Inpatient Infusions:     PRN Inpatient Medications:  acetaminophen **OR** [DISCONTINUED] acetaminophen, colchicine, HYDROcodone-acetaminophen, hydrOXYzine, ondansetron **OR** ondansetron (ZOFRAN) IV, sodium chloride  Review of Systems: Constitutional: Weight is stable.  Eyes: No changes in vision. ENT: No oral lesions, sore throat.  GI: see HPI.  Heme/Lymph: No easy bruising.  CV: No chest pain.  GU: No hematuria.  Integumentary: No rashes.  Neuro: No headaches.  Psych: No depression/anxiety.  Endocrine: No heat/cold intolerance.  Allergic/Immunologic: No urticaria.  Resp: No cough, SOB.   Musculoskeletal: No joint swelling.    Physical Examination: BP 118/53 mmHg  Pulse 73  Temp(Src) 98 F (36.7 C) (Oral)  Resp 20  Ht 5' 4"  (1.626 m)  Wt 80.831 kg (178 lb 3.2 oz)  BMI 30.57 kg/m2  SpO2 97% Gen: NAD, alert and oriented x 4 HEENT: PEERLA, EOMI, Neck: supple, no JVD or thyromegaly Chest: CTA bilaterally, no wheezes, crackles, or other adventitious sounds CV: RRR, no m/g/c/r Abd:  distended, +BS in all four quadrants; no HSM, guarding, ridigity, or rebound tenderness Ext: no edema, well perfused with 2+ pulses, Skin:   Jaundice noted Lymph: no LAD  Data: Lab Results  Component Value Date   WBC 3.8 08/24/2015   HGB 8.2* 08/24/2015   HCT 23.7* 08/24/2015   MCV 98.1 08/24/2015   PLT 55* 08/24/2015    Recent Labs Lab 08/20/15 1705 08/23/15 0434 08/24/15 0443  HGB 7.7* 7.0* 8.2*   Lab Results  Component Value Date   NA 133* 08/27/2015   K 4.6 08/27/2015   CL 102 08/27/2015   CO2 28 08/27/2015   BUN 14 08/27/2015   CREATININE 0.62 08/27/2015   Lab Results  Component Value Date   ALT 35 08/27/2015   AST 74* 08/27/2015   ALKPHOS 133* 08/27/2015   BILITOT 9.0* 08/27/2015    Recent Labs Lab 08/20/15 1705  INR 1.84   Assessment/Plan: [name] is a 69 y.o. male with alcoholic cirrhosis, GI bleed S/P upper endoscopy  Recommendations: Patient is feeling much better, eating  fine.  Anxious to go home.  Still having elevated LFT's.  Alk phos is trending upwards, currently 133, bili 9.0 Last Hgb was on 08/24/2015 and was 8.2 .  Recommend recheck asap. We will continue to follow with you. Please call with questions or concerns.  Salvadore Farber, PA-C  I personally performed these services.

## 2015-08-27 NOTE — Progress Notes (Signed)
Inpatient Diabetes Program Recommendations  AACE/ADA: New Consensus Statement on Inpatient Glycemic Control (2013)  Target Ranges:  Prepandial:   less than 140 mg/dL      Peak postprandial:   less than 180 mg/dL (1-2 hours)      Critically ill patients:  140 - 180 mg/dL   Results for [name], [name] (MRN [id number]) as of 08/27/2015 10:06  Ref. Range 08/26/2015 07:14 08/26/2015 11:33 08/26/2015 16:02 08/26/2015 21:44 08/27/2015 07:28  Glucose-Capillary Latest Ref Range: 65-99 mg/dL 139 (H) 270 (H) 344 (H) 331 (H) 175 (H)    Diabetes history: Type 2 diabetes Outpatient Diabetes medications: Lantus 80 units q HS, Novolog 20 units with breakfast, and 25 units with lunch and supper Current orders for Inpatient glycemic control: Levemir 10 units hs, Novolog correction insulin 0-9 units tid with meals an 0-5 units qhs  Post prandial blood sugars elevated; consider adding Novolog 10 units tid for meal coverage (patient takes 20-25 units with meals at home).  Continue Novolog correction as ordered tid and hs.   Gentry Fitz, RN, BA, MHA, CDE Diabetes Coordinator Inpatient Diabetes Program  [phone] (Team Pager) [phone] ([place]) 08/27/2015 10:09 AM,     Please consider restarting Novolog sensitive correction q 4 hours while NPO. May also consider restarting basal insulin Lantus 40 units q HS (this is 1/2 of home dose).

## 2015-08-27 NOTE — Consult Note (Signed)
Pt has had slowly rising bilirubin for several days with platelet count staying around 50K.  He has known cirrhosis but there may be other causes for this continual rise.  Will check [place] in morning and if neg consider CT scan of the liver/biliary tree.

## 2015-08-27 NOTE — Plan of Care (Signed)
Problem: Discharge Progression Outcomes Goal: Other Discharge Outcomes/Goals Outcome: Progressing Discharge:  Possible discharge tomorrow if labs improve. Hemodynamically stable Complications; Pt's wife fells that he is more confused. Pt is able to answer questions appropriately but is HOH. Diet.  Pt tolerating diet.  Feeds self but is slow. Activity: pt uses walker for ambulation.

## 2015-08-28 ENCOUNTER — Encounter: Payer: Self-pay | Admitting: Gastroenterology

## 2015-08-28 ENCOUNTER — Inpatient Hospital Stay: Payer: Medicare Other

## 2015-08-28 DIAGNOSIS — K7469 Other cirrhosis of liver: Secondary | ICD-10-CM

## 2015-08-28 DIAGNOSIS — G4730 Sleep apnea, unspecified: Secondary | ICD-10-CM

## 2015-08-28 DIAGNOSIS — E11319 Type 2 diabetes mellitus with unspecified diabetic retinopathy without macular edema: Secondary | ICD-10-CM

## 2015-08-28 DIAGNOSIS — R160 Hepatomegaly, not elsewhere classified: Secondary | ICD-10-CM

## 2015-08-28 DIAGNOSIS — D649 Anemia, unspecified: Secondary | ICD-10-CM

## 2015-08-28 DIAGNOSIS — Z794 Long term (current) use of insulin: Secondary | ICD-10-CM

## 2015-08-28 DIAGNOSIS — R945 Abnormal results of liver function studies: Secondary | ICD-10-CM

## 2015-08-28 DIAGNOSIS — Z8719 Personal history of other diseases of the digestive system: Secondary | ICD-10-CM

## 2015-08-28 DIAGNOSIS — E785 Hyperlipidemia, unspecified: Secondary | ICD-10-CM

## 2015-08-28 DIAGNOSIS — Z8673 Personal history of transient ischemic attack (TIA), and cerebral infarction without residual deficits: Secondary | ICD-10-CM

## 2015-08-28 DIAGNOSIS — J449 Chronic obstructive pulmonary disease, unspecified: Secondary | ICD-10-CM

## 2015-08-28 DIAGNOSIS — I10 Essential (primary) hypertension: Secondary | ICD-10-CM

## 2015-08-28 DIAGNOSIS — K7581 Nonalcoholic steatohepatitis (NASH): Secondary | ICD-10-CM

## 2015-08-28 DIAGNOSIS — R17 Unspecified jaundice: Secondary | ICD-10-CM

## 2015-08-28 DIAGNOSIS — K449 Diaphragmatic hernia without obstruction or gangrene: Secondary | ICD-10-CM

## 2015-08-28 DIAGNOSIS — F329 Major depressive disorder, single episode, unspecified: Secondary | ICD-10-CM

## 2015-08-28 DIAGNOSIS — G2581 Restless legs syndrome: Secondary | ICD-10-CM

## 2015-08-28 DIAGNOSIS — M109 Gout, unspecified: Secondary | ICD-10-CM

## 2015-08-28 LAB — COMPREHENSIVE METABOLIC PANEL
ALT: 37 U/L (ref 17–63)
AST: 81 U/L — ABNORMAL HIGH (ref 15–41)
Albumin: 2.7 g/dL — ABNORMAL LOW (ref 3.5–5.0)
Alkaline Phosphatase: 133 U/L — ABNORMAL HIGH (ref 38–126)
Anion gap: 6 (ref 5–15)
BUN: 15 mg/dL (ref 6–20)
CHLORIDE: 100 mmol/L — AB (ref 101–111)
CO2: 27 mmol/L (ref 22–32)
Calcium: 8.8 mg/dL — ABNORMAL LOW (ref 8.9–10.3)
Creatinine, Ser: 0.65 mg/dL (ref 0.61–1.24)
Glucose, Bld: 197 mg/dL — ABNORMAL HIGH (ref 65–99)
POTASSIUM: 4.3 mmol/L (ref 3.5–5.1)
Sodium: 133 mmol/L — ABNORMAL LOW (ref 135–145)
Total Bilirubin: 9 mg/dL — ABNORMAL HIGH (ref 0.3–1.2)
Total Protein: 7.5 g/dL (ref 6.5–8.1)

## 2015-08-28 LAB — GLUCOSE, CAPILLARY
GLUCOSE-CAPILLARY: 128 mg/dL — AB (ref 65–99)
GLUCOSE-CAPILLARY: 212 mg/dL — AB (ref 65–99)
GLUCOSE-CAPILLARY: 318 mg/dL — AB (ref 65–99)
Glucose-Capillary: 325 mg/dL — ABNORMAL HIGH (ref 65–99)

## 2015-08-28 MED ORDER — IOHEXOL 240 MG/ML SOLN: 50.0000 mL | Freq: Once | INTRAMUSCULAR | Status: DC | PRN

## 2015-08-28 NOTE — Plan of Care (Signed)
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

## 2015-08-28 NOTE — Progress Notes (Signed)
Patient alert and oriented x 4, SR, RA, SATs WNL. Patient had [place] today, Dr. Tiffany Kocher to speak with family of results. Oncology consult placed, to have CT of chest and abdomen 08/29/2015. Tolerated meals. Up to [facility] for UOP

## 2015-08-28 NOTE — Progress Notes (Addendum)
Nutrition Follow-up   INTERVENTION:   Meals and Snacks: Cater to patient preferences Medical Food Supplement Therapy: Continue Glucerna as pt likes and is drinking well   NUTRITION DIAGNOSIS:   Inadequate oral intake related to inability to eat as evidenced by NPO status  GOAL:   Patient will meet greater than or equal to 90% of their needs  MONITOR:    (Energy Intake, Anthropometrics, Digestive system)   ASSESSMENT:   Pt s/p upper endoscopy with cauterization. Pt remains jaundiced with elevated LFTs and elevated bilirubin.  Past Medical History  Diagnosis Date  . Stroke   . Hypertension   . COPD (chronic obstructive pulmonary disease)   . Diabetes mellitus without complication   . H/O hiatal hernia   . Depression   . Arthritis   . Hepatitis     NASH  . Cirrhosis of liver not due to alcohol   . Obstructive sleep apnea 11/20/2014  . Hyperlipidemia   . Hiatal hernia   . GI bleeds, multiple during admission   . Restless leg syndrome      Diet Order:  DIET SOFT Room service appropriate?: Yes; Fluid consistency:: Thin    Current Nutrition: Pt ate 90% of eggs and toast this am and was drinking Glucerna on visit, reports liking chocolate.  Pt reports good appetite. Recorded po intake 73% of meals on average over the past 3 days.   Medications: vitamin D, ferrous sulfate, Lasix, Novolog, Levemir, lactulose, Protonix, Xifaxan  Electrolyte/Renal Profile and Glucose Profile:   Recent Labs Lab 08/26/15 1023 08/27/15 0903 08/28/15 1056  NA 131* 133* 133*  K 4.8 4.6 4.3  CL 103 102 100*  CO2 _0 BUN _1 CREATININE 0.68 0.62 0.65  CALCIUM 8.4* 8.7* 8.8*  GLUCOSE 267* 219* 197*   Protein Profile:  Recent Labs Lab 08/26/15 1023 08/27/15 0903 08/28/15 1056  ALBUMIN 2.6* 2.8* 2.7*   Hepatic Function Latest Ref Rng 08/28/2015 08/27/2015 08/26/2015  Total Protein 6.5 - 8.1 g/dL 7.5 7.4 6.9  Albumin 3.5 - 5.0 g/dL 2.7(L) 2.8(L) 2.6(L)  AST 15 - 41 U/L  81(H) 74(H) 68(H)  ALT 17 - 63 U/L 37 35 33  Alk Phosphatase 38 - 126 U/L 133(H) 133(H) 124  Total Bilirubin 0.3 - 1.2 mg/dL 9.0(H) 9.0(H) 8.5(H)  Bilirubin, Direct 0.1 - 0.5 mg/dL - - -    Gastrointestinal Profile: distended abdomen per Nsg note Last BM: 08/27/2015 black/brown in color   Filed Weights   08/22/15 1042 08/23/15 1753  Weight: 173 lb 9 oz (78.727 kg) 178 lb 3.2 oz (80.831 kg)    Skin:   (Jaundiced, Laceration to Head)   BMI:  Body mass index is 30.57 kg/(m^2).  Estimated Nutritional Needs:   Kcal:  BEE: 1266kcals, TEE: (IF 1.1-1.3)(AF 1.2) 1671-1972kcals, using IBW of 59kg  Protein:  59-71g protein (1.0-1.2g/kg) using IBW of 59kg  Fluid:  1475-176m of fluid (25-374mkg) using IBW of 59kg  EDUCATION NEEDS:   No education needs identified at this time   LOW Care Level  AlDwyane LuoRD, LDN Pager ([phone]

## 2015-08-28 NOTE — Progress Notes (Signed)
[place] [facility] [place] NAME: Ronelle Smallman    MR#:  [id number]  DATE OF BIRTH:  November 02, 1946    CHIEF COMPLAINT:  No chief complaint on file. subjective: Patient denies any complaints. More jaundiced.  REVIEW OF SYSTEMS:   Review of Systems  Constitutional: Negative for fever and chills.  HENT: Negative for hearing loss.   Eyes: Negative for blurred vision, double vision and photophobia.  Respiratory: Negative for cough, hemoptysis and shortness of breath.   Cardiovascular: Negative for palpitations, orthopnea and leg swelling.  Gastrointestinal: Negative for vomiting, abdominal pain and diarrhea.       Ascites present  Genitourinary: Negative for dysuria and urgency.  Musculoskeletal: Negative for myalgias and neck pain.  Skin: Negative for rash.  Neurological: Negative for dizziness, focal weakness, seizures, weakness and headaches.  Psychiatric/Behavioral: Negative for memory loss. The patient does not have insomnia.    CONSTITUTIONAL: No fever, fatigue or weakness.  EYES: No blurred or double vision.  EARS, NOSE, AND THROAT: No tinnitus or ear pain.  RESPIRATORY: No cough, shortness of breath, wheezing or hemoptysis.  CARDIOVASCULAR: No chest pain, orthopnea, edema.  GASTROINTESTINAL: No nausea, vomiting, diarrhea or abdominal pain.  GENITOURINARY: No dysuria, hematuria.  ENDOCRINE: No polyuria, nocturia,  HEMATOLOGY: No anemia, easy bruising or bleeding SKIN: No rash or lesion. MUSCULOSKELETAL: No joint pain or arthritis.   NEUROLOGIC: No tingling, numbness, weakness.  PSYCHIATRY: No anxiety or depression.   DRUG ALLERGIES:   Allergies  Allergen Reactions  . Actos [Pioglitazone] Anaphylaxis  . Aminophylline Other (See Comments)    Reaction: Unknown  . Amitriptyline Other (See Comments)    Reaction: Unknown   . Ampicillin Hives  . Hctz [Hydrochlorothiazide] Other (See Comments)    Reaction: Unknown   . Naproxen  Other (See Comments)    Reaction:  GI bleeding   . Penicillins Rash    VITALS:  Blood pressure 126/57, pulse 76, temperature 97.9 F (36.6 C), temperature source Oral, resp. rate 16, height 5\' 4"  (1.626 m), weight 80.831 kg (178 lb 3.2 oz), SpO2 100 %.  PHYSICAL EXAMINATION:  GENERAL:  69 y.o.-year-old patient lying in the bed with no acute distress.  EYES: Pupils equal, round, reactive to light and accommodation. Jaundice present. Extraocular muscles intact.  HEENT: Head atraumatic, normocephalic. Oropharynx and nasopharynx clear.  NECK:  Supple, no jugular venous distention. No thyroid enlargement, no tenderness.  LUNGS: Normal breath sounds bilaterally, no wheezing, rales,rhonchi or crepitation. No use of accessory muscles of respiration.  CARDIOVASCULAR: S1, S2 normal. No murmurs, rubs, or gallops.  ABDOMEN: Soft, nontender,  Filled  with ascites. EXTREMITIES: No pedal edema, cyanosis, or clubbing.  NEUROLOGIC: Cranial nerves II through XII are intact. Muscle strength 5/5 in all extremities. Sensation intact. Gait not checked.  PSYCHIATRIC: The patient is alert and oriented x 3.  SKIN: No obvious rash, lesion, or ulcer.    LABORATORY PANEL:   CBC  Recent Labs Lab 08/27/15 1743  WBC 3.9  HGB 8.4*  HCT 25.3*  PLT 51*   ------------------------------------------------------------------------------------------------------------------  Chemistries   Recent Labs Lab 08/27/15 0903  NA 133*  K 4.6  CL 102  CO2 28  GLUCOSE 219*  BUN 14  CREATININE 0.62  CALCIUM 8.7*  AST 74*  ALT 35  ALKPHOS 133*  BILITOT 9.0*   ------------------------------------------------------------------------------------------------------------------  Cardiac Enzymes No results for input(s): TROPONINI in the last 168 hours. ------------------------------------------------------------------------------------------------------------------  RADIOLOGY:  US Abdomen Complete  08/28/2015  CLINICAL DATA:  69 year old male with cirrhosis and jaundice.  EXAM: ULTRASOUND ABDOMEN COMPLETE  COMPARISON:  12/25/2014 and prior CTs.  FINDINGS: Gallbladder: Gallbladder wall thickening and sludge identified. No cholelithiasis, sonographic Murphy's sign or pericholecystic fluid noted.  Common bile duct: Diameter: 2.6 mm. There is no evidence of intrahepatic or extrahepatic biliary dilatation.  Liver: Morphologic changes of cirrhosis are identified. A 3.6 x 4.2 x 3.7 cm hypoechoic left hepatic mass identified and may represent hepatocellular carcinoma. An ill-defined 5.1 x 3.7 x 3.8 cm hyperechoic area within the posterior left liver is identified and indeterminate.  IVC: No abnormality visualized.  Pancreas: Visualized portion unremarkable.  Spleen: Splenomegaly identified with a volume of 1271 cc. No focal splenic abnormalities are noted.  Right Kidney: Length: 12.6 cm. Echogenicity within normal limits. No mass or hydronephrosis visualized.  Left Kidney: Length: 13.2 cm. A left renal cyst is again identified. Echogenicity within normal limits. No mass or hydronephrosis visualized.  Abdominal aorta: No aneurysm visualized.  Other findings: Small to moderate ascites noted.  IMPRESSION: Cirrhosis with 3.6 x 4.2 x 3.7 cm hypoechoic left hepatic mass suspicious for hepatocellular carcinoma. 9 seconds scratch it indeterminate ill-defined 5.1 x 3.7 x 3.8 cm hyperechoic area within the posterior left liver. Elective abdominal MRI with and without contrast is recommended for further evaluation.  Splenomegaly and small to moderate ascites.  Gallbladder wall thickening with sludge. This wall thickening is likely related to hepatic dysfunction/ ascites but consider nuclear medicine study if there is strong clinical suspicion for acute cholecystitis.   Electronically Signed   By: Margarette Canada M.D.   On: 08/28/2015 09:46    EKG:   Orders placed or performed during the hospital encounter of 07/19/15  . ED EKG  . ED EKG   . EKG 12-Lead  . EKG 12-Lead  . EKG    ASSESSMENT AND PLAN:   1. Acute on chronic blood loss anemia: Status post 1 unit of transfusion. 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 Status post cauterization. Hemoglobin is stabl. Platelets are up by 55. Continue ultra soft  diet, PPIs., GI follow-up is appreciated. 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  4. Worsening jaundice, patient is evaluated by GI, had ultrasound this morning, showed gallbladder wall thickening with sludge. Patient does not have any abdominal pain, nausea. Appreciate GI following.  5. Ascites, due to cirrhosis;on lasix,add aldactone 100 mg po daily   Discharge plan depends upon workup for his jaundice. All the records are reviewed and case discussed with Care Management/Social Workerr. Management plans discussed with the patient, family and they are in agreement.  CODE STATUS: DO NOT RESUSCITATE  TOTAL TIME TAKING CARE OF THIS PATIENT: 35 minutes.   POSSIBLE D/C IN 1-2 DAYS, DEPENDING ON CLINICAL CONDITION.   Epifanio Lesches M.D on 08/28/2015 at 11:10 AM  Between 7am to 6pm - Pager - [phone]  After 6pm go to [email or web address] - password [email or web address] [facility]  [facility]  Office  [phone]  CC: Primary care physician; Adrian Prows, MD

## 2015-08-28 NOTE — Consult Note (Signed)
Pt with small mass in liver, possible cancer.  Oncology consult pending.  AFP level pending.  Abd exam not tender.  Skin jaundiced, TB running around 9 recently.  Discussed with Hospitalist.

## 2015-08-28 NOTE — Progress Notes (Signed)
Inpatient Diabetes Program Recommendations  AACE/ADA: New Consensus Statement on Inpatient Glycemic Control (2013)  Target Ranges:  Prepandial:   less than 140 mg/dL      Peak postprandial:   less than 180 mg/dL (1-2 hours)      Critically ill patients:  140 - 180 mg/dL   Results for LEANDRE, WIEN (MRN [id number]) as of 08/28/2015 12:03  Ref. Range 08/27/2015 07:28 08/27/2015 11:29 08/27/2015 16:18 08/27/2015 21:08 08/28/2015 07:24  Glucose-Capillary Latest Ref Range: 65-99 mg/dL 175 (H) 319 (H) 333 (H) 323 (H) 128 (H)   Current orders for Inpatient glycemic control: Levemir 15 units QHS, Novolog 10 units TID with meals for meal coverage, Novolog 0-9 units TID with meals, Novolog 0-5 units HS  Inpatient Diabetes Program Recommendations Correction (SSI): Please consider increasing Novolog correction to Moderate scale. Insulin - Meal Coverage: Please consider increasing Novolog meal coverage to 15 units TID with meals. NURSING, please administer meal coverage as ordered when patient eats at least 50% of meal and pre-meal glucose is greater than 80 mg/dl.  Thanks, Barnie Alderman, RN, MSN, CCRN, CDE Diabetes Coordinator Inpatient Diabetes Program [phone] (Team Pager from [place] to [place]) [phone] ([place] office) [phone] [facility] office) [phone] [facility] office)

## 2015-08-28 NOTE — Progress Notes (Signed)
Physical Therapy Treatment Patient Details Name: FRANTZ QUATTRONE Sr. MRN: [id number] DOB: 1945/12/31 Today's Date: 08/28/2015    History of Present Illness Kaysin Brock  is a 69 y.o.male with a known history of recurrent GI bleeding, hyperlipidemia, diabetes insulin requiring, COPD, gout, cirrhosis secondary to NASH, diabetic retinopathy comes from Dr. Blane Ohara office as a direct admit for low hemoglobin count. Patient was recently discharged from [facility] after he had a subdural hematoma requiring 1 unit of blood transfusion. Pt went for follow-up with PCP was found to have hemoglobin of 7.7. He denies any complaints at present. Does have melanotic stools. He is on iron supplement. Denies any coffee-ground emesis or any bright red blood per rectum. Denies any abdominal pain. Denies any fever. Pt had colonoscopy 07/01/2012 which showed 5 small polyps. He had an EGD 07/01/2012 that showed gastritis. He had a normal capsule endoscopy 03/28/2013. He had an EGD noting AVMs of the stomach 12/27/2014, and another EGD on 07/20/2015 revealing only mild gastritis w/ a benign gastric polyp. Pt reports that at baseline he ambulates at home with rolling walker. Pt endorses at least 3 falls in the last 12 months. Pt has 24/7 assistance from his wife and has [employer] aid 3 days/week.     PT Comments    Pt agreeable to PT; denies any pain or other issues noting he is feeling "pretty good". Good performance of bed mobility, transfers, ambulation and exercise program. Moves cautious and slowly for all. Pt up in chair comfortably. Continue PT for improved functional mobility to return to prior level of function.  Follow Up Recommendations        Equipment Recommendations       Recommendations for Other Services       Precautions / Restrictions Precautions Precautions: Fall Restrictions Weight Bearing Restrictions: No    Mobility  Bed Mobility Overal bed mobility: Modified Independent Bed Mobility: Supine to  Sit     Supine to sit: Modified independent (Device/Increase time) (increased time)        Transfers Overall transfer level: Needs assistance Equipment used: Rolling walker (2 wheeled) Transfers: Sit to/from Stand Sit to Stand: Supervision            Ambulation/Gait Ambulation/Gait assistance: Min guard Ambulation Distance (Feet): 180 Feet Assistive device: Rolling walker (2 wheeled) Gait Pattern/deviations: Step-through pattern;Decreased dorsiflexion - right;Decreased dorsiflexion - left (toe in especially L; chronic) Gait velocity: decreased Gait velocity interpretation: <1.8 ft/sec, indicative of risk for recurrent falls     Stairs            Wheelchair Mobility    Modified Rankin (Stroke Patients Only)       Balance           Standing balance support: Bilateral upper extremity supported Standing balance-Leahy Scale: Fair                      Cognition Arousal/Alertness: Awake/alert Behavior During Therapy: WFL for tasks assessed/performed Overall Cognitive Status: Within Functional Limits for tasks assessed                      Exercises General Exercises - Lower Extremity Ankle Circles/Pumps: AROM;Both;20 reps;Seated Quad Sets: Other (comment) (attempted; unable to understand concept but good LAQ) Long Arc Quad: AROM;Both;20 reps;Seated Heel Slides: AROM;Both;20 reps;Seated (long sit) Hip ABduction/ADduction: AROM;Both;20 reps;Seated (long sit) Straight Leg Raises: AAROM;Both;20 reps;Seated (semi reclined) Hip Flexion/Marching: AROM;Both;20 reps;Seated Toe Raises: AROM;Both;20 reps;Seated Heel Raises: AROM;Both;20 reps;Seated Mini-Sqauts:  Strengthening;Both;5 reps;Standing    General Comments        Pertinent Vitals/Pain Pain Assessment: No/denies pain    Home Living                      Prior Function            PT Goals (current goals can now be found in the care plan section)      Frequency        PT Plan      Co-evaluation             End of Session           Time: 1030-1314 PT Time Calculation (min) (ACUTE ONLY): 25 min  Charges:  $Gait Training: 8-22 mins $Therapeutic Exercise: 8-22 mins                    G Codes:      Charlaine Dalton 08/28/2015, 10:02 AM

## 2015-08-29 ENCOUNTER — Inpatient Hospital Stay: Payer: Medicare Other

## 2015-08-29 ENCOUNTER — Encounter: Payer: Self-pay | Admitting: Radiology

## 2015-08-29 LAB — GLUCOSE, CAPILLARY
GLUCOSE-CAPILLARY: 235 mg/dL — AB (ref 65–99)
Glucose-Capillary: 163 mg/dL — ABNORMAL HIGH (ref 65–99)
Glucose-Capillary: 268 mg/dL — ABNORMAL HIGH (ref 65–99)
Glucose-Capillary: 207 mg/dL — ABNORMAL HIGH (ref 65–99)

## 2015-08-29 LAB — AFP TUMOR MARKER: AFP TUMOR MARKER: 11959 ng/mL — AB (ref 0.0–8.3)

## 2015-08-29 MED ORDER — INSULIN ASPART 100 UNIT/ML ~~LOC~~ SOLN: 10.0000 [IU] | Freq: Three times a day (TID) | SUBCUTANEOUS | Status: AC

## 2015-08-29 MED ORDER — IOHEXOL 350 MG/ML SOLN
100.0000 mL | Freq: Once | INTRAVENOUS | Status: AC | PRN
  Administered 2015-08-29: 12:00:00 100 mL via INTRAVENOUS

## 2015-08-29 MED ORDER — MORPHINE SULFATE (PF) 2 MG/ML IV SOLN
2.0000 mg | INTRAVENOUS | Status: DC | PRN
Start: 2015-08-29 — End: 2015-08-30
  Administered 2015-08-29: 2 mg via INTRAVENOUS
  Filled 2015-08-29: qty 1

## 2015-08-29 MED ORDER — INSULIN DETEMIR 100 UNIT/ML ~~LOC~~ SOLN: 15.0000 [IU] | Freq: Every day | SUBCUTANEOUS | Status: DC

## 2015-08-29 NOTE — Consult Note (Addendum)
ONCOLOGY CONSULTATION NOTE -   Reason for Consultation:  Newly diagnosed liver mass on ultrasound.  HISTORY OF PRESENT ILLNESS:  [name] is a 69 y.o.gentleman with h/o multiple medical problems most notable for a known history of recurrent GI bleeding, hyperlipidemia, diabetes insulin requiring, COPD, gout, cirrhosis secondary to NASH, diabetic retinopathy who has been admitted on 08/22/15 for progressive anemia. He also was recently discharged from [facility] after he had a subdural hematoma requiring 1 unit of blood. States he is very weak. Patient has jaundice, abnormal LFTs and had ultrasound abdomen today which reports Cirrhosis with 3.6 x 4.2 x 3.7 cm hypoechoic left hepatic mass suspicious for hepatocellular carcinoma and a indeterminate ill-defined 5.1 x 3.7 x 3.8 cm hyperechoic area within the posterior left liver, splenomegaly and small to moderate ascites, gallbladder wall thickening with sludge. Currently he denies any abdominal pain, nausea or vomiting. States appetite is steady. Denies any coffee-ground emesis or any bright red blood per rectum. Denies any fever. 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 He has chronic low back pain from arthritis but denies new bone pains.   REVIEW OF SYSTEMS:  ROS Constitutional: as in HPI. No fever, chills.  HENT: No ear pain, hearing loss, nosebleeds and sore throat.  Respiratory: No hemoptysis, sputum production, wheezing.  Cardiovascular: Negative for orthopnea, claudication and leg swelling.  Gastrointestinal: recent dark stools. Denies nausea, abdominal pain or diarrhea.  Genitourinary: no dysuria and frequency.  Musculoskeletal: No new bone pain.  Skin: jaundice.  Neurological: no new focal weakness, seizures or headaches.  Endocrine: appetite is steady  PAST MEDICAL HISTORY: Reviewed. Past Medical History  Diagnosis Date  . Stroke   . Hypertension   . COPD (chronic  obstructive pulmonary disease)   . Diabetes mellitus without complication   . H/O hiatal hernia   . Depression   . Arthritis   . Hepatitis     NASH  . Cirrhosis of liver not due to alcohol   . Obstructive sleep apnea 11/20/2014  . Hyperlipidemia   . Hiatal hernia   . GI bleeds, multiple during admission   . Restless leg syndrome     PAST SURGICAL HISTORY: Reviewed. Past Surgical History  Procedure Laterality Date  . Nose surgery    . Appendectomy    . Cardiac catheterization    . Eye surgery      leaglly blind  . Anterior cervical decompression/discectomy fusion 4 levels N/A 11/13/2014    Procedure: Cervical three-four, Cervical four-five, Cervical five-six, Cervical six- seven anterior cervical decompression with fusion and interbody prosthesis plating and bonegraft; Surgeon: Consuella Lose, MD; Location: [facility]; Service: Neurosurgery; Laterality: N/A;    FAMILY HISTORY: Reviewed. Family History  Problem Relation Age of Onset  . Kidney failure Sister     CKD  . Diabetes Mellitus II Sister     SOCIAL HISTORY: Reviewed. Social History  Substance Use Topics  . Smoking status: Never Smoker   . Smokeless tobacco: Never Used  . Alcohol Use: No    Allergies  Allergen Reactions  . Actos [Pioglitazone] Anaphylaxis  . Aminophylline Other (See Comments)    Reaction: Unknown  . Amitriptyline Other (See Comments)    Reaction: Unknown   . Ampicillin Hives  . Hctz [Hydrochlorothiazide] Other (See Comments)    Reaction: Unknown   . Naproxen Other (See Comments)    Reaction: Unknown   . Penicillins Rash    Current Outpatient Prescriptions  Medication Sig Dispense Refill  . acetaminophen (TYLENOL) 325 MG tablet Take 650 mg by mouth every 4 (four) hours as needed for mild pain. 1-2 tablets every 4-6 hours as needed  for pain.    . brimonidine-timolol (COMBIGAN) 0.2-0.5 % ophthalmic solution Place 1 drop into the left eye 2 (two) times daily.     . Cholecalciferol (VITAMIN D-3) 1000 UNITS CAPS Take 1 capsule (1,000 Units total) by mouth daily. 30 capsule 1  . cholestyramine (QUESTRAN) 4 G packet Take 1 packet (4 g total) by mouth 3 (three) times daily. 60 each 12  . ferrous sulfate 325 (65 FE) MG tablet Take 1 tablet (325 mg total) by mouth 3 (three) times daily with meals. 90 tablet 3  . furosemide (LASIX) 40 MG tablet Take 1 tablet (40 mg total) by mouth daily. 30 tablet 1  . insulin aspart (NOVOLOG) 100 UNIT/ML injection Inject 20-25 Units into the skin 3 (three) times daily before meals. Pt uses 20 units at breakfast and 25 units at lunch and supper. Pt also adds units of insulin based on a sliding scale:  200-250: Add 3 units  251-300: Add 6 units  301-350: Add 9 units  351-400: Add 12 units  Over 401: Add 15 units    . lactulose (CHRONULAC) 10 GM/15ML solution Take 45 mLs (30 g total) by mouth 2 (two) times daily. (Patient taking differently: Take 20 g by mouth 2 (two) times daily. ) 240 mL 0  . omeprazole (PRILOSEC) 20 MG capsule Take 1 capsule (20 mg total) by mouth 2 (two) times daily before a meal. 60 capsule 1  . rifaximin (XIFAXAN) 550 MG TABS tablet Take 1 tablet (550 mg total) by mouth 2 (two) times daily. 60 tablet 1  . traMADol (ULTRAM) 50 MG tablet Take 1 tablet (50 mg total) by mouth every 8 (eight) hours as needed for moderate pain. 90 tablet 0  . allopurinol (ZYLOPRIM) 100 MG tablet Take 1 tablet (100 mg total) by mouth 2 (two) times daily. 60 tablet 1  . Carboxymethylcellul-Glycerin (REFRESH OPTIVE) 1-0.9 % GEL Apply 1 drop to eye 4 (four) times daily. Pt uses in the left eye.    . colchicine 0.6 MG tablet Take 0.6 mg by mouth daily as needed (for gout flares).    . insulin glargine (LANTUS) 100 UNIT/ML  injection Inject 0.7 mLs (70 Units total) into the skin at bedtime. Take 40 units tonight (8/22) and then resume your usual dose of 70 units on 8/23 10 mL 11  . ondansetron (ZOFRAN) 4 MG tablet Take 4 mg by mouth every 8 (eight) hours as needed for nausea or vomiting.    . sertraline (ZOLOFT) 25 MG tablet Take 25 mg by mouth daily.     No current facility-administered medications for this visit.    PHYSICAL EXAM: VITALS: 97.5, 73, 118/47, 96% GENERAL: Patient is alert and oriented and in no acute distress. Icterus ++. Pallor +. HEENT: EOMs intact. Bruise over right upper face/forehead. No cervical lymphadenopathy. CVS: S1S2, regular LUNGS: Bilaterally good air entry, no rhonchi. ABDOMEN: Soft, distended, nontender, BS present.  EXTREMITIES: trace pedal edema. NEURO: Cranial nerves seem intact. Moves all extremities spontaneously SKIN: jaundiced, bruising + MUSCULOSKELETAL: no obvious joint redness or swelling.   LAB RESULTS: Cr 0.65, Ca 8.8, LFT shows bili 9.0, AST 81, ALT 37, Albumin 2.7, Alk phos 133. AFP 11959.0.  08/28/15 - Ultrasound Abdomen: IMPRESSION:  Cirrhosis with 3.6 x 4.2 x 3.7 cm hypoechoic left hepatic mass  suspicious for hepatocellular carcinoma. 9 seconds scratch it indeterminate ill-defined 5.1 x 3.7 x 3.8 cm hyperechoic area within the posterior left liver. Elective abdominal MRI with and without contrast is recommended for further evaluation. Splenomegaly and small to moderate ascites. Gallbladder wall thickening with sludge. This wall thickening is likely related to hepatic dysfunction/ ascites but consider nuclear medicine study if there is strong clinical suspicion for acute cholecystitis.   ASSESSMENT / PLAN:  69 y.o.gentleman with h/o multiple medical problems as detailed above admitted on 08/22/15 for progressive anemia. Patient has jaundice, abnormal LFTs and had ultrasound abdomen today which reports Cirrhosis with 3.6 x 4.2 x 3.7 cm hypoechoic  left hepatic mass suspicious for hepatocellular carcinoma and a indeterminate ill-defined 5.1 x 3.7 x 3.8 cm hyperechoic area within the posterior left liver, splenomegaly and small to moderate ascites, gallbladder wall thickening with sludge. Serum AFP level is high at 11959.0. Given known h/o chronic liver disease, the ultrasound findings and high AFP is highly s/o possibility of hepatocellular cancer. Plan is to obtain CT scan chest/abdomen/pelvis for staging and look for metastatic disease. Patient is weak, has poor LFTs and cirrhosis, performance status is ECOG 2-3, and may not be a candidate for aggressive treatment. Once scan is done, will discuss further about possible treatment options versus palliative care. Patient was explained that if he has liver cancer, then overall prognosis is poor. He is agreeable to above plan. Thanks you for the referral, please feel free to contact me if any additional questions.

## 2015-08-29 NOTE — Plan of Care (Signed)
Problem: Discharge Progression Outcomes Goal: Other Discharge Outcomes/Goals Outcome: Progressing Patient has no complaints of pain.VSS. CT performed. Tolerating diet, no complaints of nausea.

## 2015-08-29 NOTE — Consult Note (Signed)
Pt about the same, TB 9, oncology note noted.  Very likely [place] on cirrhosis.  No bleeding, no new abd pain.  Dr. Rayann Heman will cover for me over the weekend starting tomorrow but will not see unless you call about a specific GI problem like recurrent bleeding.  Prognosis not good.

## 2015-08-29 NOTE — Care Management Important Message (Signed)
Important Message  Patient Details  Name: Luke THELIN Sr. MRN: [id number] Date of Birth: Jul 31, 1946   Medicare Important Message Given:  Yes-third notification given    [name] 08/29/2015, 9:53 AM

## 2015-08-29 NOTE — Plan of Care (Signed)
Problem: Discharge Progression Outcomes Goal: Other Discharge Outcomes/Goals Outcome: Progressing Pt is alert, disoriented to time. No c/o pain. Remains jaundiced in appearance. VSS, resting quietly.

## 2015-08-29 NOTE — Progress Notes (Signed)
Inpatient Diabetes Program Recommendations  AACE/ADA: New Consensus Statement on Inpatient Glycemic Control (2013)  Target Ranges:  Prepandial:   less than 140 mg/dL      Peak postprandial:   less than 180 mg/dL (1-2 hours)      Critically ill patients:  140 - 180 mg/dL   Results for AZZAM, MEHRA (MRN [id number]) as of 08/29/2015 09:31  Ref. Range 08/28/2015 07:24 08/28/2015 12:05 08/28/2015 16:38 08/28/2015 21:13 08/29/2015 07:20  Glucose-Capillary Latest Ref Range: 65-99 mg/dL 128 (H) 212 (H) 318 (H) 325 (H) 163 (H)   Current orders for Inpatient glycemic control: Levemir 15 units QHS, Novolog 10 units TID with meals for meal coverage, Novolog 0-9 units TID with meals, Novolog 0-5 units HS  Inpatient Diabetes Program Recommendations Insulin - Meal Coverage: In reviewing chart,noted Novolog 10 units TID meal coverage was NOT GIVEN at all yesterday (with charted reason "Parameters not met" and "Pt ate 25% of meal"). However, meal intake for lunch and supper was charted as 100% plus patient received 2 Glucerna supplements between meals. NURSING: Please administer Novolog meal coverage as ordered when parameters are met (if patient eats at least 50% of meal and premeal CBG is greater than 80 mg/dl).  Note: Do not recommend any insulin changes at this time.   Thanks, Barnie Alderman, RN, MSN, CCRN, CDE Diabetes Coordinator Inpatient Diabetes Program [phone] (Team Pager from [place] to [place]) [phone] ([place] office) [phone] [facility] office) [phone] [facility] office)

## 2015-08-30 ENCOUNTER — Inpatient Hospital Stay: Attending: Internal Medicine

## 2015-08-30 LAB — GLUCOSE, CAPILLARY
GLUCOSE-CAPILLARY: 138 mg/dL — AB (ref 65–99)
GLUCOSE-CAPILLARY: 280 mg/dL — AB (ref 65–99)

## 2015-08-30 NOTE — Progress Notes (Signed)
[facility] [place] NAME: [name]    MR#:  [id number]  DATE OF BIRTH:  1946/09/15    CHIEF COMPLAINT:  No chief complaint on file. subjective: Patient denies any complaints. More jaundiced. Abdominal CAT scan is pending. Seen by Dr. Leia Alf   from oncology. Patient denies any complaints. REVIEW OF SYSTEMS:   Review of Systems  Constitutional: Negative for fever and chills.  HENT: Negative for hearing loss.   Eyes: Negative for blurred vision, double vision and photophobia.  Respiratory: Negative for cough, hemoptysis and shortness of breath.   Cardiovascular: Negative for palpitations, orthopnea and leg swelling.  Gastrointestinal: Negative for vomiting, abdominal pain and diarrhea.       Ascites present  Genitourinary: Negative for dysuria and urgency.  Musculoskeletal: Negative for myalgias and neck pain.  Skin: Negative for rash.  Neurological: Negative for dizziness, focal weakness, seizures, weakness and headaches.  Psychiatric/Behavioral: Negative for memory loss. The patient does not have insomnia.    CONSTITUTIONAL: No fever, fatigue or weakness.  EYES: No blurred or double vision.  EARS, NOSE, AND THROAT: No tinnitus or ear pain.  RESPIRATORY: No cough, shortness of breath, wheezing or hemoptysis.  CARDIOVASCULAR: No chest pain, orthopnea, edema.  GASTROINTESTINAL: No nausea, vomiting, diarrhea or abdominal pain.  GENITOURINARY: No dysuria, hematuria.  ENDOCRINE: No polyuria, nocturia,  HEMATOLOGY: No anemia, easy bruising or bleeding SKIN: No rash or lesion. MUSCULOSKELETAL: No joint pain or arthritis.   NEUROLOGIC: No tingling, numbness, weakness.  PSYCHIATRY: No anxiety or depression.   DRUG ALLERGIES:   Allergies  Allergen Reactions  . Actos [Pioglitazone] Anaphylaxis  . Aminophylline Other (See Comments)    Reaction: Unknown  . Amitriptyline Other (See Comments)    Reaction: Unknown   .  Ampicillin Hives  . Hctz [Hydrochlorothiazide] Other (See Comments)    Reaction: Unknown   . Naproxen Other (See Comments)    Reaction:  GI bleeding   . Penicillins Rash    VITALS:  Blood pressure 130/57, pulse 78, temperature 97.6 F (36.4 C), temperature source Axillary, resp. rate 16, height 5\' 4"  (1.626 m), weight 80.831 kg (178 lb 3.2 oz), SpO2 100 %.  PHYSICAL EXAMINATION:  GENERAL:  69 y.o.-year-old patient lying in the bed with no acute distress.  EYES: Pupils equal, round, reactive to light and accommodation. Jaundice present. Extraocular muscles intact.  HEENT: Head atraumatic, normocephalic. Oropharynx and nasopharynx clear.  NECK:  Supple, no jugular venous distention. No thyroid enlargement, no tenderness.  LUNGS: Normal breath sounds bilaterally, no wheezing, rales,rhonchi or crepitation. No use of accessory muscles of respiration.  CARDIOVASCULAR: S1, S2 normal. No murmurs, rubs, or gallops.  ABDOMEN: Soft, nontender,  Filled  with ascites. EXTREMITIES: No pedal edema, cyanosis, or clubbing.  NEUROLOGIC: Cranial nerves II through XII are intact. Muscle strength 5/5 in all extremities. Sensation intact. Gait not checked.  PSYCHIATRIC: The patient is alert and oriented x 3.  SKIN: No obvious rash, lesion, or ulcer.    LABORATORY PANEL:   CBC  Recent Labs Lab 08/27/15 1743  WBC 3.9  HGB 8.4*  HCT 25.3*  PLT 51*   ------------------------------------------------------------------------------------------------------------------  Chemistries   Recent Labs Lab 08/28/15 1056  NA 133*  K 4.3  CL 100*  CO2 27  GLUCOSE 197*  BUN 15  CREATININE 0.65  CALCIUM 8.8*  AST 81*  ALT 37  ALKPHOS 133*  BILITOT 9.0*   ------------------------------------------------------------------------------------------------------------------  Cardiac Enzymes No results for input(s): TROPONINI  in the last 168  hours. ------------------------------------------------------------------------------------------------------------------  RADIOLOGY:  Ct Chest W Contrast  08/29/2015   CLINICAL DATA:  69 year old male with history of NASH and cirrhosis, with history of recurrent GI bleeding. Weakness. Jaundice. Abnormal liver function tests. Possible hepatic mass noted on recent ultrasound examination.  EXAM: CT CHEST, ABDOMEN, AND PELVIS WITH CONTRAST  TECHNIQUE: Multidetector CT imaging of the chest, abdomen and pelvis was performed following the standard protocol during bolus administration of intravenous contrast.  CONTRAST:  118mL OMNIPAQUE IOHEXOL 350 MG/ML SOLN  COMPARISON:  CT of the abdomen and pelvis 12/25/2014.  FINDINGS: CT CHEST FINDINGS  Mediastinum/Lymph Nodes: Heart size is normal. There is no significant pericardial fluid, thickening or pericardial calcification. Occluder device in the interatrial septum. No pathologically enlarged mediastinal or hilar lymph nodes. Esophagus is unremarkable in appearance. No axillary lymphadenopathy.  Lungs/Pleura: No suspicious appearing pulmonary nodules or masses. No acute consolidative airspace disease. No pleural effusions.  Musculoskeletal/Soft Tissues: There are no aggressive appearing lytic or blastic lesions noted in the visualized portions of the skeleton. Orthopedic fixation hardware in the lower cervical spine incompletely imaged.  CT ABDOMEN AND PELVIS FINDINGS  Hepatobiliary: Enlarging 3.8 x 4.6 cm hypovascular lesion centered in segment 4A of the liver (image 45 of series 2), significantly increased compared to prior study 12/25/2014. Other smaller low-attenuation lesions in the left lobe of the liver on images 53 and 59, 59 and 64 of series 2 are too small to characterize, but also appear to be new compared to prior study 12/25/2014, concerning for multicentric disease. The liver has a shrunken appearance and nodular contour, compatible with underlying  cirrhosis. There is a single mildly dilated bile duct in the left lobe of the liver (image 49 of series 2), which likely gets compressed as it traverses adjacent to the previously described left hepatic lobe mass. No extrahepatic biliary ductal dilatation. Gallbladder is nearly completely decompressed, and unremarkable in appearance.  Pancreas: No pancreatic mass. No pancreatic ductal dilatation. No pancreatic or peripancreatic fluid collections.  Spleen: Spleen is enlarged measuring 9.2 x 18.4 x 20.2 cm (estimated splenic volume of 1,710 mL).  Adrenals/Urinary Tract: Bilateral adrenal glands are normal in appearance. Multiple sub cm low-attenuation lesions in the kidneys bilaterally, too small to definitively characterize, but statistically likely cysts. 2.6 cm simple cyst in the lower pole of the left kidney. No hydroureteronephrosis. Urinary bladder is normal in appearance.  Stomach/Bowel: Normal appearance of the stomach. No pathologic dilatation of small bowel or colon.  Vascular/Lymphatic: Portal vein is mildly dilated measuring 17 mm in diameter. Numerous portosystemic collateral pathways throughout the abdomen. Minimal atherosclerosis in the abdominal and pelvic vasculature. 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 No lymphadenopathy noted in the abdomen or pelvis.  Reproductive: Prostate gland and seminal vesicles are normal in appearance.  Other: Moderate volume of ascites.  No pneumoperitoneum.  Musculoskeletal: Several well-defined lucent lesions are noted in the iliac bones bilaterally, unchanged compared to prior study 12/25/2014, presumably benign. There are otherwise no new aggressive appearing lytic or blastic lesions noted in the visualized portions of the skeleton.  IMPRESSION: 1. Stigmata of cirrhosis with portal hypertension, including splenomegaly, similar to prior examination 12/25/2014. Importantly, today's study demonstrates  interval enlargement of the previously noted lesion in the left lobe of the liver, which is currently a 3.8 x 4.6 cm mass. In addition, there are several separate lesions in the left lobe of the liver on this background  of cirrhosis, findings are highly concerning for hepatocellular carcinoma (potentially multicentric), and further evaluation of AFP level in addition to MRI of the abdomen with and without IV gadolinium is recommended for more definitive evaluation at this time. 2. Moderate volume of ascites. 3. Additional incidental findings, as above. These results will be called to the ordering clinician or representative by the Radiologist Assistant, and communication documented in the PACS or zVision Dashboard.   Electronically Signed   By: Vinnie Langton M.D.   On: 08/29/2015 13:49   Ct Abdomen Pelvis W Contrast  08/29/2015   CLINICAL DATA:  69 year old male with history of NASH and cirrhosis, with history of recurrent GI bleeding. Weakness. Jaundice. Abnormal liver function tests. Possible hepatic mass noted on recent ultrasound examination.  EXAM: CT CHEST, ABDOMEN, AND PELVIS WITH CONTRAST  TECHNIQUE: Multidetector CT imaging of the chest, abdomen and pelvis was performed following the standard protocol during bolus administration of intravenous contrast.  CONTRAST:  1107mL OMNIPAQUE IOHEXOL 350 MG/ML SOLN  COMPARISON:  CT of the abdomen and pelvis 12/25/2014.  FINDINGS: CT CHEST FINDINGS  Mediastinum/Lymph Nodes: Heart size is normal. There is no significant pericardial fluid, thickening or pericardial calcification. Occluder device in the interatrial septum. No pathologically enlarged mediastinal or hilar lymph nodes. Esophagus is unremarkable in appearance. No axillary lymphadenopathy.  Lungs/Pleura: No suspicious appearing pulmonary nodules or masses. No acute consolidative airspace disease. No pleural effusions.  Musculoskeletal/Soft Tissues: There are no aggressive appearing lytic or blastic lesions  noted in the visualized portions of the skeleton. Orthopedic fixation hardware in the lower cervical spine incompletely imaged.  CT ABDOMEN AND PELVIS FINDINGS  Hepatobiliary: Enlarging 3.8 x 4.6 cm hypovascular lesion centered in segment 4A of the liver (image 45 of series 2), significantly increased compared to prior study 12/25/2014. Other smaller low-attenuation lesions in the left lobe of the liver on images 53 and 59, 59 and 64 of series 2 are too small to characterize, but also appear to be new compared to prior study 12/25/2014, concerning for multicentric disease. The liver has a shrunken appearance and nodular contour, compatible with underlying cirrhosis. There is a single mildly dilated bile duct in the left lobe of the liver (image 49 of series 2), which likely gets compressed as it traverses adjacent to the previously described left hepatic lobe mass. No extrahepatic biliary ductal dilatation. Gallbladder is nearly completely decompressed, and unremarkable in appearance.  Pancreas: No pancreatic mass. No pancreatic ductal dilatation. No pancreatic or peripancreatic fluid collections.  Spleen: Spleen is enlarged measuring 9.2 x 18.4 x 20.2 cm (estimated splenic volume of 1,710 mL).  Adrenals/Urinary Tract: Bilateral adrenal glands are normal in appearance. Multiple sub cm low-attenuation lesions in the kidneys bilaterally, too small to definitively characterize, but statistically likely cysts. 2.6 cm simple cyst in the lower pole of the left kidney. No hydroureteronephrosis. Urinary bladder is normal in appearance.  Stomach/Bowel: Normal appearance of the stomach. No pathologic dilatation of small bowel or colon.  Vascular/Lymphatic: Portal vein is mildly dilated measuring 17 mm in diameter. Numerous portosystemic collateral pathways throughout the abdomen. Minimal atherosclerosis in the abdominal and pelvic vasculature. 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 No lymphadenopathy noted in the abdomen or pelvis.  Reproductive: Prostate gland and seminal vesicles are normal in appearance.  Other: Moderate volume of ascites.  No pneumoperitoneum.  Musculoskeletal: Several well-defined lucent lesions are noted in the iliac bones bilaterally, unchanged compared to prior study  12/25/2014, presumably benign. There are otherwise no new aggressive appearing lytic or blastic lesions noted in the visualized portions of the skeleton.  IMPRESSION: 1. Stigmata of cirrhosis with portal hypertension, including splenomegaly, similar to prior examination 12/25/2014. Importantly, today's study demonstrates interval enlargement of the previously noted lesion in the left lobe of the liver, which is currently a 3.8 x 4.6 cm mass. In addition, there are several separate lesions in the left lobe of the liver on this background of cirrhosis, findings are highly concerning for hepatocellular carcinoma (potentially multicentric), and further evaluation of AFP level in addition to MRI of the abdomen with and without IV gadolinium is recommended for more definitive evaluation at this time. 2. Moderate volume of ascites. 3. Additional incidental findings, as above. These results will be called to the ordering clinician or representative by the Radiologist Assistant, and communication documented in the PACS or zVision Dashboard.   Electronically Signed   By: Vinnie Langton M.D.   On: 08/29/2015 13:49    EKG:   Orders placed or performed during the hospital encounter of 07/19/15  . ED EKG  . ED EKG  . EKG 12-Lead  . EKG 12-Lead  . EKG    ASSESSMENT AND PLAN:   1. Acute on chronic blood loss anemia: Status post 1 unit of transfusion. 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 Status post cauterization. Hemoglobin is stabl. Platelets are up by 55. , GI follow-up is appreciated. #2 diabetes mellitus type 2 resume  Levemir, mealtime NovoLog,  #3  cirrhosis secondary to NASH; Continue rifaximin, lactulose , continue Lasix .  4. Worsening jaundice, patient is evaluated by GI, ultrasound showed hepatocellular carcinoma. Oncology consult obtained. We will CAT scan is ordered.  5. Ascites, due to cirrhosis;on lasix,add aldactone 100 mg po daily No history of falls except the one in the emergency room during this admission.   Discharge plan depends upon workup for his jaundice. All the records are reviewed and case discussed with Care Management/Social Workerr. Management plans discussed with the patient, family and they are in agreement.  CODE STATUS: DO NOT RESUSCITATE  TOTAL TIME TAKING CARE OF THIS PATIENT: 35 minutes.   POSSIBLE D/C IN 1-2 DAYS, DEPENDING ON CLINICAL CONDITION.   Epifanio Lesches M.D on 08/30/2015 at 10:41 AM  Between 7am to 6pm - Pager - [phone]  After 6pm go to [email or web address] - password [email or web address]  [facility]  Office  [phone]  CC: Primary care physician; Adrian Prows, MD

## 2015-08-30 NOTE — Progress Notes (Signed)
[place] [facility] [place] NAME: [name]    MR#:  [id number]  DATE OF BIRTH:  1946/10/31    CHIEF COMPLAINT:  No chief complaint on file. subjective: Patient denies any complaints. Ultrasound of the liver showed hepatocellular carcinoma. Oncology consult placed.  REVIEW OF SYSTEMS:   Review of Systems  Constitutional: Negative for fever and chills.  HENT: Negative for hearing loss.   Eyes: Negative for blurred vision, double vision and photophobia.  Respiratory: Negative for cough, hemoptysis and shortness of breath.   Cardiovascular: Negative for palpitations, orthopnea and leg swelling.  Gastrointestinal: Negative for vomiting, abdominal pain and diarrhea.       Ascites present  Genitourinary: Negative for dysuria and urgency.  Musculoskeletal: Negative for myalgias and neck pain.  Skin: Negative for rash.  Neurological: Negative for dizziness, focal weakness, seizures, weakness and headaches.  Psychiatric/Behavioral: Negative for memory loss. The patient does not have insomnia.    CONSTITUTIONAL: No fever, fatigue or weakness.  EYES: No blurred or double vision.  EARS, NOSE, AND THROAT: No tinnitus or ear pain.  RESPIRATORY: No cough, shortness of breath, wheezing or hemoptysis.  CARDIOVASCULAR: No chest pain, orthopnea, edema.  GASTROINTESTINAL: No nausea, vomiting, diarrhea or abdominal pain.  GENITOURINARY: No dysuria, hematuria.  ENDOCRINE: No polyuria, nocturia,  HEMATOLOGY: No anemia, easy bruising or bleeding SKIN: No rash or lesion. MUSCULOSKELETAL: No joint pain or arthritis.   NEUROLOGIC: No tingling, numbness, weakness.  PSYCHIATRY: No anxiety or depression.   DRUG ALLERGIES:   Allergies  Allergen Reactions  . Actos [Pioglitazone] Anaphylaxis  . Aminophylline Other (See Comments)    Reaction: Unknown  . Amitriptyline Other (See Comments)    Reaction: Unknown   . Ampicillin Hives  . Hctz  [Hydrochlorothiazide] Other (See Comments)    Reaction: Unknown   . Naproxen Other (See Comments)    Reaction:  GI bleeding   . Penicillins Rash    VITALS:  Blood pressure 130/57, pulse 78, temperature 97.6 F (36.4 C), temperature source Axillary, resp. rate 16, height 5\' 4"  (1.626 m), weight 80.831 kg (178 lb 3.2 oz), SpO2 100 %.  PHYSICAL EXAMINATION:  GENERAL:  69 y.o.-year-old patient lying in the bed with no acute distress.  EYES: Pupils equal, round, reactive to light and accommodation. Jaundice present. Extraocular muscles intact.  HEENT: Head atraumatic, normocephalic. Oropharynx and nasopharynx clear.  NECK:  Supple, no jugular venous distention. No thyroid enlargement, no tenderness.  LUNGS: Normal breath sounds bilaterally, no wheezing, rales,rhonchi or crepitation. No use of accessory muscles of respiration.  CARDIOVASCULAR: S1, S2 normal. No murmurs, rubs, or gallops.  ABDOMEN: Soft, nontender,  Filled  with ascites. EXTREMITIES: No pedal edema, cyanosis, or clubbing.  NEUROLOGIC: Cranial nerves II through XII are intact. Muscle strength 5/5 in all extremities. Sensation intact. Gait not checked.  PSYCHIATRIC: The patient is alert and oriented x 3.  SKIN: No obvious rash, lesion, or ulcer.    LABORATORY PANEL:   CBC  Recent Labs Lab 08/27/15 1743  WBC 3.9  HGB 8.4*  HCT 25.3*  PLT 51*   ------------------------------------------------------------------------------------------------------------------  Chemistries   Recent Labs Lab 08/28/15 1056  NA 133*  K 4.3  CL 100*  CO2 27  GLUCOSE 197*  BUN 15  CREATININE 0.65  CALCIUM 8.8*  AST 81*  ALT 37  ALKPHOS 133*  BILITOT 9.0*   ------------------------------------------------------------------------------------------------------------------  Cardiac Enzymes No results for input(s): TROPONINI in the last 168  hours. ------------------------------------------------------------------------------------------------------------------  RADIOLOGY:  Ct Chest W Contrast  08/29/2015   CLINICAL DATA:  69 year old male with history of NASH and cirrhosis, with history of recurrent GI bleeding. Weakness. Jaundice. Abnormal liver function tests. Possible hepatic mass noted on recent ultrasound examination.  EXAM: CT CHEST, ABDOMEN, AND PELVIS WITH CONTRAST  TECHNIQUE: Multidetector CT imaging of the chest, abdomen and pelvis was performed following the standard protocol during bolus administration of intravenous contrast.  CONTRAST:  120mL OMNIPAQUE IOHEXOL 350 MG/ML SOLN  COMPARISON:  CT of the abdomen and pelvis 12/25/2014.  FINDINGS: CT CHEST FINDINGS  Mediastinum/Lymph Nodes: Heart size is normal. There is no significant pericardial fluid, thickening or pericardial calcification. Occluder device in the interatrial septum. No pathologically enlarged mediastinal or hilar lymph nodes. Esophagus is unremarkable in appearance. No axillary lymphadenopathy.  Lungs/Pleura: No suspicious appearing pulmonary nodules or masses. No acute consolidative airspace disease. No pleural effusions.  Musculoskeletal/Soft Tissues: There are no aggressive appearing lytic or blastic lesions noted in the visualized portions of the skeleton. Orthopedic fixation hardware in the lower cervical spine incompletely imaged.  CT ABDOMEN AND PELVIS FINDINGS  Hepatobiliary: Enlarging 3.8 x 4.6 cm hypovascular lesion centered in segment 4A of the liver (image 45 of series 2), significantly increased compared to prior study 12/25/2014. Other smaller low-attenuation lesions in the left lobe of the liver on images 53 and 59, 59 and 64 of series 2 are too small to characterize, but also appear to be new compared to prior study 12/25/2014, concerning for multicentric disease. The liver has a shrunken appearance and nodular contour, compatible with underlying  cirrhosis. There is a single mildly dilated bile duct in the left lobe of the liver (image 49 of series 2), which likely gets compressed as it traverses adjacent to the previously described left hepatic lobe mass. No extrahepatic biliary ductal dilatation. Gallbladder is nearly completely decompressed, and unremarkable in appearance.  Pancreas: No pancreatic mass. No pancreatic ductal dilatation. No pancreatic or peripancreatic fluid collections.  Spleen: Spleen is enlarged measuring 9.2 x 18.4 x 20.2 cm (estimated splenic volume of 1,710 mL).  Adrenals/Urinary Tract: Bilateral adrenal glands are normal in appearance. Multiple sub cm low-attenuation lesions in the kidneys bilaterally, too small to definitively characterize, but statistically likely cysts. 2.6 cm simple cyst in the lower pole of the left kidney. No hydroureteronephrosis. Urinary bladder is normal in appearance.  Stomach/Bowel: Normal appearance of the stomach. No pathologic dilatation of small bowel or colon.  Vascular/Lymphatic: Portal vein is mildly dilated measuring 17 mm in diameter. Numerous portosystemic collateral pathways throughout the abdomen. Minimal atherosclerosis in the abdominal and pelvic vasculature. 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 No lymphadenopathy noted in the abdomen or pelvis.  Reproductive: Prostate gland and seminal vesicles are normal in appearance.  Other: Moderate volume of ascites.  No pneumoperitoneum.  Musculoskeletal: Several well-defined lucent lesions are noted in the iliac bones bilaterally, unchanged compared to prior study 12/25/2014, presumably benign. There are otherwise no new aggressive appearing lytic or blastic lesions noted in the visualized portions of the skeleton.  IMPRESSION: 1. Stigmata of cirrhosis with portal hypertension, including splenomegaly, similar to prior examination 12/25/2014. Importantly, today's study demonstrates  interval enlargement of the previously noted lesion in the left lobe of the liver, which is currently a 3.8 x 4.6 cm mass. In addition, there are several separate lesions in the left lobe of the liver on this background of cirrhosis, findings are highly concerning for hepatocellular carcinoma (potentially  multicentric), and further evaluation of AFP level in addition to MRI of the abdomen with and without IV gadolinium is recommended for more definitive evaluation at this time. 2. Moderate volume of ascites. 3. Additional incidental findings, as above. These results will be called to the ordering clinician or representative by the Radiologist Assistant, and communication documented in the PACS or zVision Dashboard.   Electronically Signed   By: Vinnie Langton M.D.   On: 08/29/2015 13:49   Ct Abdomen Pelvis W Contrast  08/29/2015   CLINICAL DATA:  69 year old male with history of NASH and cirrhosis, with history of recurrent GI bleeding. Weakness. Jaundice. Abnormal liver function tests. Possible hepatic mass noted on recent ultrasound examination.  EXAM: CT CHEST, ABDOMEN, AND PELVIS WITH CONTRAST  TECHNIQUE: Multidetector CT imaging of the chest, abdomen and pelvis was performed following the standard protocol during bolus administration of intravenous contrast.  CONTRAST:  160mL OMNIPAQUE IOHEXOL 350 MG/ML SOLN  COMPARISON:  CT of the abdomen and pelvis 12/25/2014.  FINDINGS: CT CHEST FINDINGS  Mediastinum/Lymph Nodes: Heart size is normal. There is no significant pericardial fluid, thickening or pericardial calcification. Occluder device in the interatrial septum. No pathologically enlarged mediastinal or hilar lymph nodes. Esophagus is unremarkable in appearance. No axillary lymphadenopathy.  Lungs/Pleura: No suspicious appearing pulmonary nodules or masses. No acute consolidative airspace disease. No pleural effusions.  Musculoskeletal/Soft Tissues: There are no aggressive appearing lytic or blastic lesions  noted in the visualized portions of the skeleton. Orthopedic fixation hardware in the lower cervical spine incompletely imaged.  CT ABDOMEN AND PELVIS FINDINGS  Hepatobiliary: Enlarging 3.8 x 4.6 cm hypovascular lesion centered in segment 4A of the liver (image 45 of series 2), significantly increased compared to prior study 12/25/2014. Other smaller low-attenuation lesions in the left lobe of the liver on images 53 and 59, 59 and 64 of series 2 are too small to characterize, but also appear to be new compared to prior study 12/25/2014, concerning for multicentric disease. The liver has a shrunken appearance and nodular contour, compatible with underlying cirrhosis. There is a single mildly dilated bile duct in the left lobe of the liver (image 49 of series 2), which likely gets compressed as it traverses adjacent to the previously described left hepatic lobe mass. No extrahepatic biliary ductal dilatation. Gallbladder is nearly completely decompressed, and unremarkable in appearance.  Pancreas: No pancreatic mass. No pancreatic ductal dilatation. No pancreatic or peripancreatic fluid collections.  Spleen: Spleen is enlarged measuring 9.2 x 18.4 x 20.2 cm (estimated splenic volume of 1,710 mL).  Adrenals/Urinary Tract: Bilateral adrenal glands are normal in appearance. Multiple sub cm low-attenuation lesions in the kidneys bilaterally, too small to definitively characterize, but statistically likely cysts. 2.6 cm simple cyst in the lower pole of the left kidney. No hydroureteronephrosis. Urinary bladder is normal in appearance.  Stomach/Bowel: Normal appearance of the stomach. No pathologic dilatation of small bowel or colon.  Vascular/Lymphatic: Portal vein is mildly dilated measuring 17 mm in diameter. Numerous portosystemic collateral pathways throughout the abdomen. Minimal atherosclerosis in the abdominal and pelvic vasculature. 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 No lymphadenopathy noted in the abdomen or pelvis.  Reproductive: Prostate gland and seminal vesicles are normal in appearance.  Other: Moderate volume of ascites.  No pneumoperitoneum.  Musculoskeletal: Several well-defined lucent lesions are noted in the iliac bones bilaterally, unchanged compared to prior study 12/25/2014, presumably benign. There are otherwise no new aggressive appearing  lytic or blastic lesions noted in the visualized portions of the skeleton.  IMPRESSION: 1. Stigmata of cirrhosis with portal hypertension, including splenomegaly, similar to prior examination 12/25/2014. Importantly, today's study demonstrates interval enlargement of the previously noted lesion in the left lobe of the liver, which is currently a 3.8 x 4.6 cm mass. In addition, there are several separate lesions in the left lobe of the liver on this background of cirrhosis, findings are highly concerning for hepatocellular carcinoma (potentially multicentric), and further evaluation of AFP level in addition to MRI of the abdomen with and without IV gadolinium is recommended for more definitive evaluation at this time. 2. Moderate volume of ascites. 3. Additional incidental findings, as above. These results will be called to the ordering clinician or representative by the Radiologist Assistant, and communication documented in the PACS or zVision Dashboard.   Electronically Signed   By: Vinnie Langton M.D.   On: 08/29/2015 13:49    EKG:   Orders placed or performed during the hospital encounter of 07/19/15  . ED EKG  . ED EKG  . EKG 12-Lead  . EKG 12-Lead  . EKG    ASSESSMENT AND PLAN:   1. Acute on chronic blood loss anemia: Status post 1 unit of transfusion. 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 Status post cauterization.Continue ultra soft  diet, PPIs., GI follow-up is appreciated. 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  4. Worsening jaundice, patient is evaluated by oncology,secondary to hepatocellular carcinoma. Abdominal CAT scan, CT of the chest reviewed. I spoke with Dr. Trevor Iha  pandit was trying to talk to interventional radiology to see if we can get to direct therapy therapy  Liver.  Ascites, due to cirrhosis;on lasix,add aldactone 100 mg po daily   Prognosis is poor. All the records are reviewed and case discussed with Care Management/Social Workerr. Management plans discussed with the patient, family and they are in agreement.  CODE STATUS: DO NOT RESUSCITATE  TOTAL TIME TAKING CARE OF THIS PATIENT: 35 minutes.   POSSIBLE D/C IN 1-2 DAYS, DEPENDING ON CLINICAL CONDITION.   Epifanio Lesches M.D on 08/30/2015 at 10:35 AM  Between 7am to 6pm - Pager - [phone]  After 6pm go to [email or web address] - password [email or web address] [facility]  [facility]  Office  [phone]  CC: Primary care physician; Adrian Prows, MD

## 2015-08-30 NOTE — Progress Notes (Signed)
Physical Therapy Treatment Patient Details Name: Luke JOHN Sr. MRN: [id number] DOB: 1946-01-29 Today's Date: 08/30/2015    History of Present Illness [name]  is a 69 y.o.male with a known history of recurrent GI bleeding, hyperlipidemia, diabetes insulin requiring, COPD, gout, cirrhosis secondary to NASH, diabetic retinopathy comes from Dr. Blane Ohara office as a direct admit for low hemoglobin count. Patient was recently discharged from [facility] after he had a subdural hematoma requiring 1 unit of blood transfusion. Pt went for follow-up with PCP was found to have hemoglobin of 7.7. He denies any complaints at present. Does have melanotic stools. He is on iron supplement. Denies any coffee-ground emesis or any bright red blood per rectum. Denies any abdominal pain. Denies any fever. Pt had colonoscopy 07/01/2012 which showed 5 small polyps. He had an EGD 07/01/2012 that showed gastritis. He had a normal capsule endoscopy 03/28/2013. He had an EGD noting AVMs of the stomach 12/27/2014, and another EGD on 07/20/2015 revealing only mild gastritis w/ a benign gastric polyp. Pt reports that at baseline he ambulates at home with rolling walker. Pt endorses at least 3 falls in the last 12 months. Pt has 24/7 assistance from his wife and has [facility] aid 3 days/week.     PT Comments    Pt maintaining functional strength and is still able to perform his mobility with relative independence and increased time to perform tasks. Pt is very pleasant and willing to perform therapy. Pt does still have activity tolerance and gait deviation deficits that will continue to benefit from skilled PT in order for him to return home safely. Given his history of falls, it is recommended that his supervision/assistance be increased to 24 hrs for prevention of future falls.   Follow Up Recommendations  Home health PT;Supervision/Assistance - 24 hour     Equipment Recommendations  None recommended by PT     Recommendations for Other Services       Precautions / Restrictions Precautions Precautions: Fall Restrictions Weight Bearing Restrictions: No    Mobility  Bed Mobility Overal bed mobility: Modified Independent Bed Mobility: Supine to Sit     Supine to sit: Modified independent (Device/Increase time)     General bed mobility comments: No assist needed for bed mobility, just increased time  Transfers Overall transfer level: Needs assistance Equipment used: Rolling walker (2 wheeled) Transfers: Sit to/from Stand Sit to Stand: Supervision         General transfer comment: Pt shows good functional strength and knowledge of hand placement  Ambulation/Gait Ambulation/Gait assistance: Min guard Ambulation Distance (Feet): 220 Feet Assistive device: Rolling walker (2 wheeled) Gait Pattern/deviations: WFL(Within Functional Limits) Gait velocity: decreased Gait velocity interpretation: <1.8 ft/sec, indicative of risk for recurrent falls General Gait Details: Pt has occasional stagger or drift in either direction, but is able to self correct. No significant LOBs   Stairs            Wheelchair Mobility    Modified Rankin (Stroke Patients Only)       Balance Overall balance assessment: History of Falls                                  Cognition Arousal/Alertness: Awake/alert Behavior During Therapy: WFL for tasks assessed/performed Overall Cognitive Status: Within Functional Limits for tasks assessed  Exercises Other Exercises Other Exercises: Pt performed bilateral therex x 15 reps at supervision for proper technique. Exercises included: LAQ, SLR, hip abd, hip marching, and closed chain plantarflexion.     General Comments        Pertinent Vitals/Pain Pain Assessment: No/denies pain    Home Living                      Prior Function            PT Goals (current goals can now be found in the  care plan section) Acute Rehab PT Goals Patient Stated Goal: To walk out into the hall PT Goal Formulation: With patient Time For Goal Achievement: 09/09/15 Potential to Achieve Goals: Good Progress towards PT goals: Progressing toward goals    Frequency  Min 2X/week    PT Plan Current plan remains appropriate    Co-evaluation             End of Session Equipment Utilized During Treatment: Gait belt Activity Tolerance: Patient tolerated treatment well Patient left: in chair;with call bell/phone within reach;with chair alarm set;with family/visitor present     Time: 7858-8502 PT Time Calculation (min) (ACUTE ONLY): 24 min  Charges:                       G CodesJanyth Contes 09/05/2015, 1:04 PM  [name], SPT. [phone]

## 2015-08-30 NOTE — Care Management Important Message (Signed)
Important Message  Patient Details  Name: Luke GOODCHILD Sr. MRN: [id number] Date of Birth: Feb 27, 1946   Medicare Important Message Given:  Yes-fourth notification given    Shelbie Ammons, RN 08/30/2015, 10:35 AM

## 2015-08-30 NOTE — Plan of Care (Signed)
Problem: Discharge Progression Outcomes Goal: Other Discharge Outcomes/Goals Outcome: Completed/Met Date Met:  08/30/15 VSS. Pt denies pain/n/v. Tolerates diet. Discharge instructions given and explained to pt and spouse. Pt and spouse verbalized understanding. Pt discharged home with hospice. Escorted to car on a wheelchair.

## 2015-08-30 NOTE — Progress Notes (Signed)
Inpatient Diabetes Program Recommendations  AACE/ADA: New Consensus Statement on Inpatient Glycemic Control (2013)  Target Ranges:  Prepandial:   less than 140 mg/dL      Peak postprandial:   less than 180 mg/dL (1-2 hours)      Critically ill patients:  140 - 180 mg/dL   Results for CALIBER, LANDESS (MRN [id number]) as of 08/30/2015 11:32  Ref. Range 08/29/2015 07:20 08/29/2015 11:14 08/29/2015 16:45 08/29/2015 21:28 08/30/2015 07:19 08/30/2015 11:12  Glucose-Capillary Latest Ref Range: 65-99 mg/dL 163 (H) 268 (H) 207 (H) 235 (H) 138 (H) 280 (H)   Current orders for Inpatient glycemic control: Levemir 15 units QHS, Novolog 10 units TID with meals for meal coverage, Novolog 0-9 units TID with meals, Novolog 0-5 units HS  Inpatient Diabetes Program Recommendations Insulin - Meal Coverage: Please consider increasing meal coverage to 15 units TID with meals.  Thanks, Barnie Alderman, RN, MSN, CCRN, CDE Diabetes Coordinator Inpatient Diabetes Program [phone] (Team Pager from [place] to [place]) [phone] ([facility] office) [phone] [facility] office) [phone] [facility] office)

## 2015-08-30 NOTE — Care Management (Signed)
Spoke with Dr. Ma Hillock. Would like Hospice services in the home. Would like [facility]. Flo Shanks, RN representative for [facility]  Updated. Will update Dr. Vianne Bulls. Shelbie Ammons RN MSN Care Management [phone].

## 2015-08-30 NOTE — Consult Note (Signed)
ONCOLOGY FOLLOWUP NOTE -   HPI: still very weak. Appetite is steady. Denies abdominal pain. ROS: jaundice. No vomiting or diarrhea. No fevers. Exam: weak, otherwise alert and oriented, NAD. Icteric.            Vitals - 97.8, 73, 16, 122/47, 98%            Lungs - b/l good air entry            Abdomen - distended, soft, NT.  Labs:  08/28/15 - serum AFP 11959, Cr 0.65, Bili 9.0, AST 81, ALT 37, alk phos 133, albumin 2.7.  Radiology: CT chest/abdomen/pelvis.  IMPRESSION:  1. Stigmata of cirrhosis with portal hypertension, including splenomegaly, similar to prior examination 12/25/2014. Importantly, today's study demonstrates interval enlargement of the previously noted lesion in the left lobe of the liver, which is currently a 3.8 x 4.6 cm mass. In addition, there are several separate lesions in the left lobe of the liver on this background of cirrhosis, findings are highly concerning for hepatocellular carcinoma (potentially multicentric), and further evaluation of AFP level in addition to MRI of the abdomen with and without IV gadolinium is recommended for more definitive evaluation at this time.  2. Moderate volume of ascites.  3. Additional incidental findings, as above. These results will be called to the ordering clinician or representative by the Radiologist Assistant, and communication documented in the PACS or zVision Dashboard.  IMPRESSION / RECOMMENDATIONS: 69 y.o.gentleman with h/o multiple medical problems as detailed above admitted on 08/22/15 for progressive anemia. Patient has jaundice, abnormal LFTs and had CT scan reports cirrhosis with 3.8 x 4.6 cm left hepatic mass suspicious for hepatocellular carcinoma with several separate lesions. Have d/w interventional radiologist Dr.Heath Laurence Ferrari and opinion is patient is not a candidate for local therapies like embolization or other procedures given marked hyperbilirubinemia. Also not a candidate for liver transplant given large size of liver  mass and poor performance status and liver dysfunction. Have explained to patient and family also that due to high bilirubin and poor performance status (ECOG 3) he is not a good candidate for systemic targeted therapy like Nexavar. Have explained that overall prognosis is poor and recommended hospice which they also want to pursue. Will f/u at cancer center in 3 weeks and if LFTs improves significantly we could consider treatment, otherwise continue with hospice. He is agreeable to above plan.

## 2015-08-30 NOTE — Plan of Care (Signed)
Problem: Discharge Progression Outcomes Goal: Other Discharge Outcomes/Goals Outcome: Progressing Pt is alert to self and location, disoriented to time and situation. No c/o pain at this time. PO fluids encouraged.

## 2015-08-30 NOTE — Progress Notes (Signed)
New referral for [facility] services at home after discharge received from [facility]. [name] is a 69 year old man with a known history of recurrent GI bleeding, hyperlipidemia,  insulin dependent diabetes requiring, COPD, gout, cirrhosis secondary to NASH, diabetic retinopathy, CVA and HTN who was a direct admission on 9/1 from  Dr. Blane Ohara office for evaluation of low hemoglobin count. Patient has had GI consult and now oncology consult for [place] and CT findings of hepatic mass. Dr. Ma Hillock met with [name] and his family, no treatment to be offered, he recommended and family agreed to hospice care at home. Writer met with patient and his wife [name] and daughter [name] in the room. Patient was noticeably jaundice with ascites present. He was alert and oriented and slow to respond at times. He is legally blind d/t diabetic retinopathy. Of note his wife [name] is hearing impaired and [name] interprets with sign language for her mother. Education initiated regarding hospice services, philosophy and team approach to care with good understanding voiced. Patient's brother received hospice services in his home a few years ago. Patient has not DME needs at this time. Currently in the home: cane, walker, wheel chair, hospital bed and shower seat. He is not currently requiring oxygen. Plan is for discharge today via [facility] with his wife and daughter. Patient was a DNR here at the hospital. Code status addressed, much discussion had. Signed portable DNR given to patient by staff RN [name] with discharge paper work. All information faxed to referral intake. Thank you for the opportunity to serve [name] and his family.  Flo Shanks RN, BSN, [facility], [facility] [phone] c

## 2015-09-02 NOTE — Discharge Summary (Signed)
Luke KRUZEL Sr., is a 69 y.o. male  DOB 03-Oct-1946  MRN [id number].  Admission date:  08/22/2015  Admitting Physician  Fritzi Mandes, MD  Discharge Date:  9/092016   Primary MD  Adrian Prows, MD  Recommendations for primary care physician for things to follow:  Follow up with PMD in  One week    Admission Diagnosis  GI Bleed melena and anemia, history of portal hypertensive gastropathy   Discharge Diagnosis  GI Bleed melena and anemia, history of portal hypertensive gastropathy    Principal Problem:   Anemia with chronic illness Active Problems:   Anemia      Past Medical History  Diagnosis Date  . Stroke   . Hypertension   . COPD (chronic obstructive pulmonary disease)   . Diabetes mellitus without complication   . H/O hiatal hernia   . Depression   . Arthritis   . Hepatitis     NASH  . Cirrhosis of liver not due to alcohol   . Obstructive sleep apnea 11/20/2014  . Hyperlipidemia   . Hiatal hernia   . GI bleeds, multiple during admission   . Restless leg syndrome     Past Surgical History  Procedure Laterality Date  . Nose surgery    . Appendectomy    . Cardiac catheterization    . Eye surgery      leaglly blind  . Anterior cervical decompression/discectomy fusion 4 levels N/A 11/13/2014    Procedure:  Cervical three-four, Cervical four-five, Cervical five-six, Cervical six- seven anterior cervical decompression with fusion and interbody prosthesis plating and bonegraft;  Surgeon: Consuella Lose, MD;  Location: [facility] NEURO ORS;  Service: Neurosurgery;  Laterality: N/A;  . Esophagogastroduodenoscopy N/A 08/23/2015    Procedure: ESOPHAGOGASTRODUODENOSCOPY (EGD);  Surgeon: Manya Silvas, MD;  Location: [facility] ENDOSCOPY;  Service: Endoscopy;  Laterality: N/A;  . 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 comes in low hb count.admitted as direct admit from Dr,Fitzerald office, Pt was at [facility] [place] recently and had 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  2.h/o Cirrhosis due to NASH.pt had elevated Total bili upto 9.seen by GI for follow up and had ultrasound liver which showed. Hepatic mass.concerning this pt seen by DR.Sandeep pandit,he ordered CT abdomen,chest for work up.pt AFP levels are very elevated upto  11959.as his performance status ECOG; 2-3,oncology recommended no aggressive therapy,and we discharged him home with hospice serviced at home.pt and wife understood this.follow up at cancer center in 3 weeks. 3.DMII;seen by Diabetic  nurse and  We changed his insulin regimen and discharged him home with  novolog 15 units wth meals and levemir 15 units . 4.h/o ascites; due to cirrhosis;continued on lactulose,lasix,aldactone,xifaxan.  Prognosis is poor.  Discharge Condition:stable   Follow  UP      Discharge Instructions  and  Discharge Medications    Discharge Instructions    Face-to-face encounter (required for Medicare/Medicaid patients)    Complete by:  As directed   I Sebastian Dzik certify that this patient is under my care and that I, or a nurse practitioner or physician's assistant working with me, had a face-to-face encounter that meets the physician face-to-face encounter requirements with this patient on 08/30/2015. The encounter with the patient was in whole, or in part for the following medical condition(s) which is the primary reason for home health care (List medical condition): Hepatocellular carcinoma Cirrhosis  jaundice  The encounter with the patient was in whole, or in part, for the following medical condition, which is the primary reason for home health care:  whole  I certify that, based on my findings, the following services are medically necessary home health services:  Physical therapy  Reason for Medically Necessary Home Health Services:  Therapy- Home Adaptation to Facilitate Safety  My clinical findings support the need for the above services:  Unsafe ambulation due to balance issues  Further, I certify that my clinical findings support that this patient is homebound due to:  Unable to leave home safely without assistance     Home Health    Complete by:  As directed   To provide the following care/treatments:  PT            Medication List    STOP taking these medications        HYDROcodone-acetaminophen 5-325 MG per tablet  Commonly known as:  NORCO     insulin glargine 100 UNIT/ML injection  Commonly known as:  LANTUS      TAKE these medications        acetaminophen 325 MG tablet  Commonly known as:  TYLENOL  Take 325-650 mg by mouth every 4 (four) hours as needed for mild pain.     allopurinol 100 MG tablet  Commonly known as:  ZYLOPRIM  Take 1 tablet (100 mg total) by mouth 2 (two) times daily.     brimonidine-timolol  0.2-0.5 % ophthalmic solution  Commonly known as:  COMBIGAN  Place 1 drop into the left eye 2 (two) times daily.     cholestyramine 4 G packet  Commonly known as:  QUESTRAN  Take 1 packet (4 g total) by mouth 3 (three) times daily.     colchicine 0.6 MG tablet  Take 0.6 mg by mouth daily as needed (for gout flares).     ferrous sulfate 325 (65 FE) MG tablet  Take 1 tablet (325 mg total) by mouth 3 (three) times daily with meals.     furosemide 40 MG tablet  Commonly known as:  LASIX  Take 1 tablet (40 mg total) by mouth daily.     insulin aspart 100 UNIT/ML injection  Commonly known as:  novoLOG  Inject 10 Units into the skin 3 (three) times daily with meals.     insulin detemir 100 UNIT/ML injection  Commonly known as:  LEVEMIR  Inject 0.1 mLs (10 Units total) into the skin at bedtime.     insulin detemir 100 UNIT/ML injection  Commonly known as:  LEVEMIR  Inject 0.15 mLs (15 Units  total) into the skin at bedtime.     lactulose 10 GM/15ML solution  Commonly known as:  CHRONULAC  Take 45 mLs (30 g total) by mouth 2 (two) times daily.     omeprazole 20 MG capsule  Commonly known as:  PRILOSEC  Take 1 capsule (20 mg total) by mouth 2 (two) times daily before a meal.     REFRESH OPTIVE 1-0.9 % Gel  Generic drug:  Carboxymethylcellul-Glycerin  Apply 1 drop to eye 4 (four) times daily. Pt uses in the left eye.     rifaximin 550 MG Tabs tablet  Commonly known as:  XIFAXAN  Take 1 tablet (550 mg total) by mouth 2 (two) times daily.     sertraline 50 MG tablet  Commonly known as:  ZOLOFT  Take 50 mg by mouth daily.     Vitamin D-3 1000 UNITS Caps  Take 1 capsule (1,000 Units total) by mouth daily.      ASK your doctor about these medications        ondansetron 4 MG tablet  Commonly known as:  ZOFRAN  Take 1 tablet (4 mg total) by mouth every 8 (eight) hours as needed for nausea or vomiting.  Ask about: Should I take this medication?          Diet and Activity  recommendation: See Discharge Instructions above   Consults obtained - oncology,GI,PT   Major procedures and Radiology Reports - PLEASE review detailed and final reports for all details, in brief -     Ct Head Wo Contrast  08/20/2015   CLINICAL DATA:  Headache with nausea and vomiting. History of subdural hematoma.  EXAM: CT HEAD WITHOUT CONTRAST  TECHNIQUE: Contiguous axial images were obtained from the base of the skull through the vertex without intravenous contrast.  COMPARISON:  08/10/2015  FINDINGS: Tiny left frontal subdural hematoma is similar in size compared to the prior study and has decreased in density, measuring up to 3 mm in thickness and without associated mass effect. There is no evidence of new intracranial hemorrhage, acute large territory infarct, mass, or midline shift. Mild generalized cerebral atrophy is within normal limits for age.  Visualized orbits are unremarkable. A small right frontal scalp hematoma is again seen. Visualized paranasal sinuses and mastoid air cells are without significant inflammatory change. No skull fracture is identified.  IMPRESSION: 1. Decreased density of tiny left frontal subdural hematoma, unchanged in size and without mass effect. 2. No evidence of new intracranial abnormality.   Electronically Signed   By: Logan Bores M.D.   On: 08/20/2015 17:42   Ct Head Wo Contrast  08/10/2015   CLINICAL DATA:  Follow-up subdural hematoma.  EXAM: CT HEAD WITHOUT CONTRAST  TECHNIQUE: Contiguous axial images were obtained from the base of the skull through the vertex without intravenous contrast.  COMPARISON:  CT head August 08, 2015  FINDINGS: Similar to slightly smaller dense 3 mm LEFT frontal subdural hematoma without mass effect. No intraparenchymal hemorrhage, midline shift. Ventricles and sulci are normal for patient's age. No acute large vascular territory infarct. Minimal white matter changes compatible chronic small vessel ischemic disease, less than  expected for age.  Basal cisterns are patent. Moderate RIGHT frontal scalp hematoma without subcutaneous gas radiopaque foreign bodies. No skull fracture. Mild ethmoid mucosal thickening. The mastoid air cells are well aerated. Included ocular globes and orbital contents are normal.  IMPRESSION: Stable to slightly smaller 3 mm LEFT frontal subdural hematoma without mass effect.   Electronically  Signed   By: Elon Alas M.D.   On: 08/10/2015 06:53   Ct Head Wo Contrast  08/08/2015   CLINICAL DATA:  [place].  Hit head today.  EXAM: CT HEAD WITHOUT CONTRAST  TECHNIQUE: Contiguous axial images were obtained from the base of the skull through the vertex without intravenous contrast.  COMPARISON:  06/11/2014  FINDINGS: Stable age related cerebral atrophy, ventriculomegaly and periventricular white matter disease. There is a small left final subdural hematoma without mass effect. It measures approximately 5 mm in diameter. No CT findings for acute hemispheric infarction or intracranial hemorrhage. No mass lesions. The brainstem and cerebellum are normal.  There is a the right frontal scalp hematoma but no underlying skull fracture. The paranasal sinuses and mastoid air cells are clear. The globes are intact.  IMPRESSION: 1. Small left frontal subdural hematoma. Recommend follow-up imaging. 2. Right frontal scalp hematoma but no underlying skull fracture. 3. Stable age related cerebral atrophy, ventriculomegaly and periventricular white matter disease.   Electronically Signed   By: Marijo Sanes M.D.   On: 08/08/2015 16:43   Ct Chest W Contrast  08/29/2015   CLINICAL DATA:  69 year old male with history of NASH and cirrhosis, with history of recurrent GI bleeding. Weakness. Jaundice. Abnormal liver function tests. Possible hepatic mass noted on recent ultrasound examination.  EXAM: CT CHEST, ABDOMEN, AND PELVIS WITH CONTRAST  TECHNIQUE: Multidetector CT imaging of the chest, abdomen and pelvis was performed  following the standard protocol during bolus administration of intravenous contrast.  CONTRAST:  126mL OMNIPAQUE IOHEXOL 350 MG/ML SOLN  COMPARISON:  CT of the abdomen and pelvis 12/25/2014.  FINDINGS: CT CHEST FINDINGS  Mediastinum/Lymph Nodes: Heart size is normal. There is no significant pericardial fluid, thickening or pericardial calcification. Occluder device in the interatrial septum. No pathologically enlarged mediastinal or hilar lymph nodes. Esophagus is unremarkable in appearance. No axillary lymphadenopathy.  Lungs/Pleura: No suspicious appearing pulmonary nodules or masses. No acute consolidative airspace disease. No pleural effusions.  Musculoskeletal/Soft Tissues: There are no aggressive appearing lytic or blastic lesions noted in the visualized portions of the skeleton. Orthopedic fixation hardware in the lower cervical spine incompletely imaged.  CT ABDOMEN AND PELVIS FINDINGS  Hepatobiliary: Enlarging 3.8 x 4.6 cm hypovascular lesion centered in segment 4A of the liver (image 45 of series 2), significantly increased compared to prior study 12/25/2014. Other smaller low-attenuation lesions in the left lobe of the liver on images 53 and 59, 59 and 64 of series 2 are too small to characterize, but also appear to be new compared to prior study 12/25/2014, concerning for multicentric disease. The liver has a shrunken appearance and nodular contour, compatible with underlying cirrhosis. There is a single mildly dilated bile duct in the left lobe of the liver (image 49 of series 2), which likely gets compressed as it traverses adjacent to the previously described left hepatic lobe mass. No extrahepatic biliary ductal dilatation. Gallbladder is nearly completely decompressed, and unremarkable in appearance.  Pancreas: No pancreatic mass. No pancreatic ductal dilatation. No pancreatic or peripancreatic fluid collections.  Spleen: Spleen is enlarged measuring 9.2 x 18.4 x 20.2 cm (estimated splenic volume of  1,710 mL).  Adrenals/Urinary Tract: Bilateral adrenal glands are normal in appearance. Multiple sub cm low-attenuation lesions in the kidneys bilaterally, too small to definitively characterize, but statistically likely cysts. 2.6 cm simple cyst in the lower pole of the left kidney. No hydroureteronephrosis. Urinary bladder is normal in appearance.  Stomach/Bowel: Normal appearance of the stomach. No pathologic  dilatation of small bowel or colon.  Vascular/Lymphatic: Portal vein is mildly dilated measuring 17 mm in diameter. Numerous portosystemic collateral pathways throughout the abdomen. Minimal atherosclerosis in the abdominal and pelvic vasculature. 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 No lymphadenopathy noted in the abdomen or pelvis.  Reproductive: Prostate gland and seminal vesicles are normal in appearance.  Other: Moderate volume of ascites.  No pneumoperitoneum.  Musculoskeletal: Several well-defined lucent lesions are noted in the iliac bones bilaterally, unchanged compared to prior study 12/25/2014, presumably benign. There are otherwise no new aggressive appearing lytic or blastic lesions noted in the visualized portions of the skeleton.  IMPRESSION: 1. Stigmata of cirrhosis with portal hypertension, including splenomegaly, similar to prior examination 12/25/2014. Importantly, today's study demonstrates interval enlargement of the previously noted lesion in the left lobe of the liver, which is currently a 3.8 x 4.6 cm mass. In addition, there are several separate lesions in the left lobe of the liver on this background of cirrhosis, findings are highly concerning for hepatocellular carcinoma (potentially multicentric), and further evaluation of AFP level in addition to MRI of the abdomen with and without IV gadolinium is recommended for more definitive evaluation at this time. 2. Moderate volume of ascites. 3. Additional incidental  findings, as above. These results will be called to the ordering clinician or representative by the Radiologist Assistant, and communication documented in the PACS or zVision Dashboard.   Electronically Signed   By: Vinnie Langton M.D.   On: 08/29/2015 13:49   Ct Cervical Spine Wo Contrast  08/09/2015   CLINICAL DATA:  Recent fall with posterior right-sided neck pain, initial encounter  EXAM: CT CERVICAL SPINE WITHOUT CONTRAST  TECHNIQUE: Multidetector CT imaging of the cervical spine was performed without intravenous contrast. Multiplanar CT image reconstructions were also generated.  COMPARISON:  11/15/2014  FINDINGS: Seven cervical segments are again well visualized. There are again changes of prior cervical fusion with anterior fixation from C3-C7. The hardware appears intact and stable no acute fracture or acute facet abnormality is noted. Multilevel facet hypertrophic changes are seen. Posterior osteophytes are noted at multiple levels  IMPRESSION: Postsurgical and degenerative changes.  No acute abnormality noted.   Electronically Signed   By: Inez Catalina M.D.   On: 08/09/2015 07:40   US Abdomen Complete  08/28/2015   CLINICAL DATA:  69 year old male with cirrhosis and jaundice.  EXAM: ULTRASOUND ABDOMEN COMPLETE  COMPARISON:  12/25/2014 and prior CTs.  FINDINGS: Gallbladder: Gallbladder wall thickening and sludge identified. No cholelithiasis, sonographic Murphy's sign or pericholecystic fluid noted.  Common bile duct: Diameter: 2.6 mm. There is no evidence of intrahepatic or extrahepatic biliary dilatation.  Liver: Morphologic changes of cirrhosis are identified. A 3.6 x 4.2 x 3.7 cm hypoechoic left hepatic mass identified and may represent hepatocellular carcinoma. An ill-defined 5.1 x 3.7 x 3.8 cm hyperechoic area within the posterior left liver is identified and indeterminate.  IVC: No abnormality visualized.  Pancreas: Visualized portion unremarkable.  Spleen: Splenomegaly identified with a  volume of 1271 cc. No focal splenic abnormalities are noted.  Right Kidney: Length: 12.6 cm. Echogenicity within normal limits. No mass or hydronephrosis visualized.  Left Kidney: Length: 13.2 cm. A left renal cyst is again identified. Echogenicity within normal limits. No mass or hydronephrosis visualized.  Abdominal aorta: No aneurysm visualized.  Other findings: Small to moderate ascites noted.  IMPRESSION: Cirrhosis with 3.6 x 4.2 x 3.7 cm hypoechoic left hepatic  mass suspicious for hepatocellular carcinoma. 9 seconds scratch it indeterminate ill-defined 5.1 x 3.7 x 3.8 cm hyperechoic area within the posterior left liver. Elective abdominal MRI with and without contrast is recommended for further evaluation.  Splenomegaly and small to moderate ascites.  Gallbladder wall thickening with sludge. This wall thickening is likely related to hepatic dysfunction/ ascites but consider nuclear medicine study if there is strong clinical suspicion for acute cholecystitis.   Electronically Signed   By: Margarette Canada M.D.   On: 08/28/2015 09:46   Ct Abdomen Pelvis W Contrast  08/29/2015   CLINICAL DATA:  69 year old male with history of NASH and cirrhosis, with history of recurrent GI bleeding. Weakness. Jaundice. Abnormal liver function tests. Possible hepatic mass noted on recent ultrasound examination.  EXAM: CT CHEST, ABDOMEN, AND PELVIS WITH CONTRAST  TECHNIQUE: Multidetector CT imaging of the chest, abdomen and pelvis was performed following the standard protocol during bolus administration of intravenous contrast.  CONTRAST:  140mL OMNIPAQUE IOHEXOL 350 MG/ML SOLN  COMPARISON:  CT of the abdomen and pelvis 12/25/2014.  FINDINGS: CT CHEST FINDINGS  Mediastinum/Lymph Nodes: Heart size is normal. There is no significant pericardial fluid, thickening or pericardial calcification. Occluder device in the interatrial septum. No pathologically enlarged mediastinal or hilar lymph nodes. Esophagus is unremarkable in appearance.  No axillary lymphadenopathy.  Lungs/Pleura: No suspicious appearing pulmonary nodules or masses. No acute consolidative airspace disease. No pleural effusions.  Musculoskeletal/Soft Tissues: There are no aggressive appearing lytic or blastic lesions noted in the visualized portions of the skeleton. Orthopedic fixation hardware in the lower cervical spine incompletely imaged.  CT ABDOMEN AND PELVIS FINDINGS  Hepatobiliary: Enlarging 3.8 x 4.6 cm hypovascular lesion centered in segment 4A of the liver (image 45 of series 2), significantly increased compared to prior study 12/25/2014. Other smaller low-attenuation lesions in the left lobe of the liver on images 53 and 59, 59 and 64 of series 2 are too small to characterize, but also appear to be new compared to prior study 12/25/2014, concerning for multicentric disease. The liver has a shrunken appearance and nodular contour, compatible with underlying cirrhosis. There is a single mildly dilated bile duct in the left lobe of the liver (image 49 of series 2), which likely gets compressed as it traverses adjacent to the previously described left hepatic lobe mass. No extrahepatic biliary ductal dilatation. Gallbladder is nearly completely decompressed, and unremarkable in appearance.  Pancreas: No pancreatic mass. No pancreatic ductal dilatation. No pancreatic or peripancreatic fluid collections.  Spleen: Spleen is enlarged measuring 9.2 x 18.4 x 20.2 cm (estimated splenic volume of 1,710 mL).  Adrenals/Urinary Tract: Bilateral adrenal glands are normal in appearance. Multiple sub cm low-attenuation lesions in the kidneys bilaterally, too small to definitively characterize, but statistically likely cysts. 2.6 cm simple cyst in the lower pole of the left kidney. No hydroureteronephrosis. Urinary bladder is normal in appearance.  Stomach/Bowel: Normal appearance of the stomach. No pathologic dilatation of small bowel or colon.  Vascular/Lymphatic: Portal vein is mildly  dilated measuring 17 mm in diameter. Numerous portosystemic collateral pathways throughout the abdomen. Minimal atherosclerosis in the abdominal and pelvic vasculature. 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 No lymphadenopathy noted in the abdomen or pelvis.  Reproductive: Prostate gland and seminal vesicles are normal in appearance.  Other: Moderate volume of ascites.  No pneumoperitoneum.  Musculoskeletal: Several well-defined lucent lesions are noted in the iliac bones bilaterally, unchanged compared to prior study  12/25/2014, presumably benign. There are otherwise no new aggressive appearing lytic or blastic lesions noted in the visualized portions of the skeleton.  IMPRESSION: 1. Stigmata of cirrhosis with portal hypertension, including splenomegaly, similar to prior examination 12/25/2014. Importantly, today's study demonstrates interval enlargement of the previously noted lesion in the left lobe of the liver, which is currently a 3.8 x 4.6 cm mass. In addition, there are several separate lesions in the left lobe of the liver on this background of cirrhosis, findings are highly concerning for hepatocellular carcinoma (potentially multicentric), and further evaluation of AFP level in addition to MRI of the abdomen with and without IV gadolinium is recommended for more definitive evaluation at this time. 2. Moderate volume of ascites. 3. Additional incidental findings, as above. These results will be called to the ordering clinician or representative by the Radiologist Assistant, and communication documented in the PACS or zVision Dashboard.   Electronically Signed   By: Vinnie Langton M.D.   On: 08/29/2015 13:49   Dg Knee Complete 4 Views Right  08/08/2015   CLINICAL DATA:  Fall, right anterior knee abrasion  EXAM: RIGHT KNEE - COMPLETE 4+ VIEW  COMPARISON:  None.  FINDINGS: No fracture or dislocation is seen.  Suspected old posttraumatic  deformity to the proximal tibia.  Mild tricompartmental degenerative changes.  Small suprapatellar knee joint effusion.  IMPRESSION: No fracture or dislocation is seen.  Mild degenerative changes.  Small suprapatellar knee joint effusion.   Electronically Signed   By: Julian Hy M.D.   On: 08/08/2015 16:57    Micro Results   No results found for this or any previous visit (from the past 240 hour(s)).     Today   Subjective:   [name] today has no headache,no chest abdominal pain,no new weakness tingling or numbness, feels much better wants to go home today.   Objective:   Blood pressure 122/47, pulse 73, temperature 97.8 F (36.6 C), temperature source Oral, resp. rate 16, height 5\' 4"  (1.626 m), weight 80.831 kg (178 lb 3.2 oz), SpO2 98 %.  No intake or output data in the 24 hours ending 09/02/15 0700  Exam Awake Alert, Oriented x 3, No new F.N deficits, Normal affect [place].AT,PERRAL,had scleral icterus. Supple Neck,No JVD, No cervical lymphadenopathy appriciated.  Symmetrical Chest wall movement, Good air movement bilaterally, CTAB RRR,No Gallops,Rubs or new Murmurs, No Parasternal Heave +ve B.Sounds, Abd Soft, Non tender, No organomegaly appriciated, No rebound -guarding or rigidity. No Cyanosis, Clubbing or edema, No new Rash or bruise  Data Review   CBC w Diff:  Lab Results  Component Value Date   WBC 3.9 08/27/2015   WBC 4.8 12/24/2014   HGB 8.4* 08/27/2015   HGB 10.3* 12/24/2014   HCT 25.3* 08/27/2015   HCT 30.5* 12/24/2014   PLT 51* 08/27/2015   PLT 57* 12/24/2014   LYMPHOPCT 10% 08/27/2015   LYMPHOPCT 13.8 12/24/2014   MONOPCT 8% 08/27/2015   MONOPCT 6.8 12/24/2014   EOSPCT 6% 08/27/2015   EOSPCT 4.8 12/24/2014   BASOPCT 1% 08/27/2015   BASOPCT 1.2 12/24/2014    CMP:  Lab Results  Component Value Date   NA 133* 08/28/2015   NA 139 12/24/2014   K 4.3 08/28/2015   K 4.0 12/24/2014   CL 100* 08/28/2015   CL 107 12/24/2014   CO2 27  08/28/2015   CO2 24 12/24/2014   BUN 15 08/28/2015   BUN 12 12/24/2014   CREATININE 0.65 08/28/2015   CREATININE 0.90 12/24/2014  PROT 7.5 08/28/2015   PROT 8.1 12/24/2014   ALBUMIN 2.7* 08/28/2015   ALBUMIN 3.0* 12/24/2014   BILITOT 9.0* 08/28/2015   BILITOT 0.9 12/24/2014   ALKPHOS 133* 08/28/2015   ALKPHOS 166* 12/24/2014   AST 81* 08/28/2015   AST 62* 12/24/2014   ALT 37 08/28/2015   ALT 31 12/24/2014  .   Total Time in preparing paper work, data evaluation and todays exam - 74 minutes  Dasja Brase M.D on 08/30/2015 at 7:00 AM

## 2015-09-03 ENCOUNTER — Other Ambulatory Visit: Payer: Self-pay

## 2015-09-03 DIAGNOSIS — D638 Anemia in other chronic diseases classified elsewhere: Secondary | ICD-10-CM

## 2015-09-06 ENCOUNTER — Inpatient Hospital Stay

## 2015-09-06 ENCOUNTER — Telehealth: Payer: Self-pay | Admitting: *Deleted

## 2015-09-06 ENCOUNTER — Other Ambulatory Visit: Payer: Self-pay | Admitting: *Deleted

## 2015-09-06 ENCOUNTER — Other Ambulatory Visit: Payer: Self-pay | Admitting: Internal Medicine

## 2015-09-06 DIAGNOSIS — R160 Hepatomegaly, not elsewhere classified: Secondary | ICD-10-CM | POA: Diagnosis not present

## 2015-09-06 DIAGNOSIS — Z8673 Personal history of transient ischemic attack (TIA), and cerebral infarction without residual deficits: Secondary | ICD-10-CM | POA: Insufficient documentation

## 2015-09-06 DIAGNOSIS — D649 Anemia, unspecified: Secondary | ICD-10-CM

## 2015-09-06 DIAGNOSIS — M129 Arthropathy, unspecified: Secondary | ICD-10-CM | POA: Diagnosis not present

## 2015-09-06 DIAGNOSIS — R1011 Right upper quadrant pain: Secondary | ICD-10-CM | POA: Insufficient documentation

## 2015-09-06 DIAGNOSIS — F329 Major depressive disorder, single episode, unspecified: Secondary | ICD-10-CM | POA: Insufficient documentation

## 2015-09-06 DIAGNOSIS — D61818 Other pancytopenia: Secondary | ICD-10-CM | POA: Diagnosis not present

## 2015-09-06 DIAGNOSIS — I10 Essential (primary) hypertension: Secondary | ICD-10-CM | POA: Insufficient documentation

## 2015-09-06 DIAGNOSIS — K7460 Unspecified cirrhosis of liver: Secondary | ICD-10-CM | POA: Insufficient documentation

## 2015-09-06 DIAGNOSIS — E119 Type 2 diabetes mellitus without complications: Secondary | ICD-10-CM | POA: Diagnosis not present

## 2015-09-06 DIAGNOSIS — E780 Pure hypercholesterolemia: Secondary | ICD-10-CM | POA: Insufficient documentation

## 2015-09-06 DIAGNOSIS — G4730 Sleep apnea, unspecified: Secondary | ICD-10-CM | POA: Insufficient documentation

## 2015-09-06 DIAGNOSIS — K766 Portal hypertension: Secondary | ICD-10-CM | POA: Insufficient documentation

## 2015-09-06 DIAGNOSIS — Z794 Long term (current) use of insulin: Secondary | ICD-10-CM | POA: Insufficient documentation

## 2015-09-06 DIAGNOSIS — R161 Splenomegaly, not elsewhere classified: Secondary | ICD-10-CM | POA: Diagnosis not present

## 2015-09-06 DIAGNOSIS — Z79899 Other long term (current) drug therapy: Secondary | ICD-10-CM | POA: Diagnosis not present

## 2015-09-06 DIAGNOSIS — R188 Other ascites: Secondary | ICD-10-CM | POA: Diagnosis not present

## 2015-09-06 DIAGNOSIS — G2581 Restless legs syndrome: Secondary | ICD-10-CM | POA: Insufficient documentation

## 2015-09-06 DIAGNOSIS — J449 Chronic obstructive pulmonary disease, unspecified: Secondary | ICD-10-CM | POA: Insufficient documentation

## 2015-09-06 DIAGNOSIS — R531 Weakness: Secondary | ICD-10-CM | POA: Insufficient documentation

## 2015-09-06 DIAGNOSIS — K449 Diaphragmatic hernia without obstruction or gangrene: Secondary | ICD-10-CM | POA: Insufficient documentation

## 2015-09-06 LAB — CBC WITH DIFFERENTIAL/PLATELET
BASOS ABS: 0 10*3/uL (ref 0–0.1)
Basophils Relative: 1 %
EOS ABS: 0.1 10*3/uL (ref 0–0.7)
Eosinophils Relative: 3 %
HCT: 20.4 % — ABNORMAL LOW (ref 40.0–52.0)
HEMOGLOBIN: 6.7 g/dL — AB (ref 13.0–18.0)
Lymphocytes Relative: 11 %
Lymphs Abs: 0.3 10*3/uL — ABNORMAL LOW (ref 1.0–3.6)
MCH: 33.7 pg (ref 26.0–34.0)
MCHC: 32.9 g/dL (ref 32.0–36.0)
MCV: 102.5 fL — ABNORMAL HIGH (ref 80.0–100.0)
MONO ABS: 0.3 10*3/uL (ref 0.2–1.0)
Monocytes Relative: 9 %
NEUTROS PCT: 76 %
Neutro Abs: 2.1 10*3/uL (ref 1.4–6.5)
Platelets: 45 10*3/uL — ABNORMAL LOW (ref 150–440)
RBC: 1.99 MIL/uL — AB (ref 4.40–5.90)
RDW: 23.2 % — ABNORMAL HIGH (ref 11.5–14.5)
WBC: 2.8 10*3/uL — AB (ref 3.8–10.6)

## 2015-09-06 LAB — PREPARE RBC (CROSSMATCH)

## 2015-09-06 LAB — SAMPLE TO BLOOD BANK

## 2015-09-06 NOTE — Telephone Encounter (Signed)
Daughter called me back and said that she is there at home with pt right now and he is up and moving around pretty good and he is some tired. The hgb is low and they would like to come in get transfusion and they will need to be [facility] and daughter states they will be here.  Orders entered by pandit and i released the prepare for rbc and the type and screen and it was confirmed by blood bank and they know it is for Monday

## 2015-09-06 NOTE — Telephone Encounter (Signed)
Dr Ma Hillock requested that I call daughter and see how patient is doing.  Is he active.  He knows hospice is in and his hgb was 6.8 which if the patient is active then getting a blood transfusion may help him feel better.  If he is non active then most of the time it will not help to get transfusion and if it does it usually is for 2 days.  If they want transfusion we can plan it for Monday 9/19.  I have called daughter and got her voicemail. Will await response.

## 2015-09-09 ENCOUNTER — Other Ambulatory Visit: Payer: Medicare Other

## 2015-09-09 ENCOUNTER — Telehealth: Payer: Self-pay | Admitting: *Deleted

## 2015-09-09 ENCOUNTER — Other Ambulatory Visit: Payer: Self-pay | Admitting: Internal Medicine

## 2015-09-09 ENCOUNTER — Inpatient Hospital Stay

## 2015-09-09 VITALS — BP 129/67 | HR 85 | Temp 96.0°F | Resp 18

## 2015-09-09 DIAGNOSIS — D649 Anemia, unspecified: Secondary | ICD-10-CM

## 2015-09-09 MED ORDER — LEVOFLOXACIN 250 MG PO TABS: 250.0000 mg | ORAL_TABLET | Freq: Every day | ORAL | Status: DC

## 2015-09-09 MED ORDER — GUAIFENESIN-CODEINE 100-10 MG/5ML PO SYRP: 5.0000 mL | ORAL_SOLUTION | ORAL | Status: DC | PRN

## 2015-09-09 MED ORDER — ACETAMINOPHEN 325 MG PO TABS
650.0000 mg | ORAL_TABLET | Freq: Once | ORAL | Status: AC
  Administered 2015-09-09: 650 mg via ORAL
  Filled 2015-09-09: qty 2

## 2015-09-09 MED ORDER — SODIUM CHLORIDE 0.9 % IV SOLN
250.0000 mL | Freq: Once | INTRAVENOUS | Status: AC
  Administered 2015-09-09: 250 mL via INTRAVENOUS
  Filled 2015-09-09: qty 250

## 2015-09-09 MED ORDER — DIPHENHYDRAMINE HCL 12.5 MG/5ML PO ELIX
12.5000 mg | ORAL_SOLUTION | Freq: Once | ORAL | Status: AC
  Administered 2015-09-09: 12.5 mg via ORAL
  Filled 2015-09-09: qty 5

## 2015-09-09 MED ORDER — HYDROXYZINE HCL 25 MG PO TABS: ORAL_TABLET | ORAL | Status: DC

## 2015-09-09 NOTE — Addendum Note (Signed)
Addended by: Luella Cook on: 09/09/2015 05:22 PM   Modules accepted: Orders

## 2015-09-09 NOTE — Telephone Encounter (Signed)
Has a cough and keeps him from sleeping, family requesting rx for cough. He is here getting blood today

## 2015-09-09 NOTE — Telephone Encounter (Signed)
Family was here today and asked for cough med and Dr. Ma Hillock wanted me to look it up and see what he has had in the past and I looked up old system and no cough med and then I called his pharmacy and no cough med. Ever sent in as rx for him.  Then Dr. Ma Hillock wanted me to check with GI because he has cirrhosis. They called back with codeine cough med 5 ml every 4 hours as needed. Then I got a call from [place] at hospice stating that family has told her they want something for him for itching that he had in the hospital.  I checked and went back to the admission before hsi last one and found that he had ordered hydroxyzine 25 mg up to tid as needed.  Spoke with pandit on both meds and he was fine with 1/2 to 1 tablet of itching med tid as needed and cherratussin and both was called into pharmacy. Then I called and left stephanie a message about the above and then I called daughter number but pt answered the phone and told him both meds called in for him.

## 2015-09-10 LAB — TYPE AND SCREEN
ABO/RH(D): AB POS
Antibody Screen: NEGATIVE
UNIT DIVISION: 0
Unit division: 0
Unit division: 0

## 2015-09-11 LAB — PREPARE RBC (CROSSMATCH)

## 2015-09-15 ENCOUNTER — Emergency Department
Admission: EM | Admit: 2015-09-15 | Discharge: 2015-09-16 | Disposition: A | Discharge status: Home / Self Care | Source: Home / Self Care | Attending: Emergency Medicine | Admitting: Emergency Medicine

## 2015-09-15 DIAGNOSIS — R112 Nausea with vomiting, unspecified: Secondary | ICD-10-CM

## 2015-09-15 DIAGNOSIS — R197 Diarrhea, unspecified: Principal | ICD-10-CM

## 2015-09-15 DIAGNOSIS — K922 Gastrointestinal hemorrhage, unspecified: Secondary | ICD-10-CM | POA: Diagnosis not present

## 2015-09-15 DIAGNOSIS — K921 Melena: Secondary | ICD-10-CM | POA: Diagnosis not present

## 2015-09-15 LAB — COMPREHENSIVE METABOLIC PANEL
ALBUMIN: 2.8 g/dL — AB (ref 3.5–5.0)
ALT: 38 U/L (ref 17–63)
ANION GAP: 9 (ref 5–15)
AST: 110 U/L — AB (ref 15–41)
Alkaline Phosphatase: 164 U/L — ABNORMAL HIGH (ref 38–126)
BUN: 24 mg/dL — AB (ref 6–20)
CHLORIDE: 103 mmol/L (ref 101–111)
CO2: 24 mmol/L (ref 22–32)
Calcium: 8.8 mg/dL — ABNORMAL LOW (ref 8.9–10.3)
Creatinine, Ser: 1.23 mg/dL (ref 0.61–1.24)
GFR calc Af Amer: >60 mL/min (ref >60)
GFR calc non Af Amer: 59 mL/min — ABNORMAL LOW (ref >60)
GLUCOSE: 115 mg/dL — AB (ref 65–99)
POTASSIUM: 3.7 mmol/L (ref 3.5–5.1)
Sodium: 136 mmol/L (ref 135–145)
Total Bilirubin: 8.9 mg/dL — ABNORMAL HIGH (ref 0.3–1.2)
Total Protein: 8.1 g/dL (ref 6.5–8.1)

## 2015-09-15 LAB — LIPASE, BLOOD: LIPASE: 33 U/L (ref 22–51)

## 2015-09-15 LAB — CBC
HCT: 25.1 % — ABNORMAL LOW (ref 40.0–52.0)
Hemoglobin: 8.6 g/dL — ABNORMAL LOW (ref 13.0–18.0)
MCH: 34.1 pg — ABNORMAL HIGH (ref 26.0–34.0)
MCHC: 34.4 g/dL (ref 32.0–36.0)
MCV: 99 fL (ref 80.0–100.0)
PLATELETS: 43 10*3/uL — AB (ref 150–440)
RBC: 2.53 MIL/uL — ABNORMAL LOW (ref 4.40–5.90)
RDW: 22.7 % — AB (ref 11.5–14.5)
WBC: 4.1 10*3/uL (ref 3.8–10.6)

## 2015-09-15 MED ORDER — SODIUM CHLORIDE 0.9 % IV SOLN
1000.0000 mL | Freq: Once | INTRAVENOUS | Status: AC
  Administered 2015-09-15: 1000 mL via INTRAVENOUS

## 2015-09-15 MED ORDER — PROMETHAZINE HCL 25 MG/ML IJ SOLN
12.5000 mg | Freq: Once | INTRAMUSCULAR | Status: AC
  Administered 2015-09-15: 12.5 mg via INTRAVENOUS
  Filled 2015-09-15: qty 1

## 2015-09-15 NOTE — ED Notes (Signed)
Patient began to have nausea after eating breakfast this AM. Has had N/V/D since then.

## 2015-09-15 NOTE — ED Provider Notes (Signed)
[facility] Emergency Department Provider Note  ____________________________________________  Time seen: On arrival  I have reviewed the triage vital signs and the nursing notes.   HISTORY  Chief Complaint Nausea; Emesis; and Diarrhea    HPI Luke MIKITA Sr. is a 69 y.o. male who presents with nausea vomiting and diarrhea since this morning. Patient is reportedly home hospice patient for liver disease. He has not had fevers or chills. He denies abdominal pain. He reports watery diarrhea and nausea. His vomiting is nonbilious and nonbloody.     Past Medical History  Diagnosis Date  . Stroke   . Hypertension   . COPD (chronic obstructive pulmonary disease)   . Diabetes mellitus without complication   . H/O hiatal hernia   . Depression   . Arthritis   . Hepatitis     NASH  . Cirrhosis of liver not due to alcohol   . Obstructive sleep apnea 11/20/2014  . Hyperlipidemia   . Hiatal hernia   . GI bleeds, multiple during admission   . Restless leg syndrome     Patient Active Problem List   Diagnosis Date Noted  . Anemia with chronic illness 08/22/2015  . Pancytopenia 08/09/2015  . SDH (subdural hematoma) 08/09/2015  . Gout 08/09/2015  . Hypertension   . Cirrhosis of liver not due to alcohol   . Hyperlipidemia   . Iron deficiency anemia   . Gastric polyp   . Upper GI bleed 07/19/2015  . UTI (lower urinary tract infection) 11/23/2014  . Thrombocytopenia 11/23/2014  . Anemia 11/23/2014  . NASH (nonalcoholic steatohepatitis) 11/21/2014  . Stenosis of cervical spine region 11/20/2014  . Type 2 diabetes mellitus, uncontrolled, with retinopathy 11/20/2014  . History of COPD 11/20/2014  . Diabetes mellitus with diabetic retinopathy   . Acute respiratory acidosis   . Encounter for intubation   . Encounter for orogastric (OG) tube placement   . Surgery, other elective   . Cervical spondylosis with myelopathy 11/13/2014    Past Surgical  History  Procedure Laterality Date  . Nose surgery    . Appendectomy    . Cardiac catheterization    . Eye surgery      leaglly blind  . Anterior cervical decompression/discectomy fusion 4 levels N/A 11/13/2014    Procedure:  Cervical three-four, Cervical four-five, Cervical five-six, Cervical six- seven anterior cervical decompression with fusion and interbody prosthesis plating and bonegraft;  Surgeon: Consuella Lose, MD;  Location: [place] [facility]UR[facility] ORS;  Service: Neurosurgery;  Laterality: N/A;  . Esophagogastroduodenoscopy N/A 08/23/2015    Procedure: ESOPHAGOGASTRODUODENOSCOPY (EGD);  Surgeon: Manya Silvas, MD;  Location[facility] ENDOSCOPY;  Service: Endoscopy;  Laterality: N/A;  . Esophagogastroduodenoscopy (egd) with propofol N/A 07/20/2015    Procedure: ESOPHAGOGASTRODUODENOSCOPY (EGD) WITH PROPOFOL;  Surgeon: Lucilla Lame, MD;  Location: [facility] ENDOSCOPY;  Service: Endoscopy;  Laterality: N/A;    Current Outpatient Rx  Name  Route  Sig  Dispense  Refill  . acetaminophen (TYLENOL) 325 MG tablet   Oral   Take 325-650 mg by mouth every 4 (four) hours as needed for mild pain.          Marland Kitchen allopurinol (ZYLOPRIM) 100 MG tablet   Oral   Take 1 tablet (100 mg total) by mouth 2 (two) times daily.   60 tablet   1   . brimonidine-timolol (COMBIGAN) 0.2-0.5 % ophthalmic solution   Left Eye   Place 1 drop into the left eye 2 (two) times daily.          Marland Kitchen  Carboxymethylcellul-Glycerin (REFRESH OPTIVE) 1-0.9 % GEL   Ophthalmic   Apply 1 drop to eye 4 (four) times daily. Pt uses in the left eye.         . Cholecalciferol (VITAMIN D-3) 1000 UNITS CAPS   Oral   Take 1 capsule (1,000 Units total) by mouth daily.   30 capsule   1   . cholestyramine (QUESTRAN) 4 G packet   Oral   Take 1 packet (4 g total) by mouth 3 (three) times daily.   60 each   12   . colchicine 0.6 MG tablet   Oral   Take 0.6 mg by mouth daily as needed (for gout flares).         . ferrous sulfate 325 (65 FE)  MG tablet   Oral   Take 1 tablet (325 mg total) by mouth 3 (three) times daily with meals.   90 tablet   3   . furosemide (LASIX) 40 MG tablet   Oral   Take 1 tablet (40 mg total) by mouth daily.   30 tablet   1   . guaiFENesin-codeine (CHERATUSSIN AC) 100-10 MG/5ML syrup   Oral   Take 5 mLs by mouth every 4 (four) hours as needed for cough.   280 mL   1   . hydrOXYzine (ATARAX/VISTARIL) 25 MG tablet      1/2 to 1 tablet tid as needed for itching   42 tablet   1   . insulin aspart (NOVOLOG) 100 UNIT/ML injection   Subcutaneous   Inject 10 Units into the skin 3 (three) times daily with meals.   10 mL   11   . insulin detemir (LEVEMIR) 100 UNIT/ML injection   Subcutaneous   Inject 0.1 mLs (10 Units total) into the skin at bedtime.   10 mL   11   . insulin detemir (LEVEMIR) 100 UNIT/ML injection   Subcutaneous   Inject 0.15 mLs (15 Units total) into the skin at bedtime.   10 mL   11   . lactulose (CHRONULAC) 10 GM/15ML solution   Oral   Take 45 mLs (30 g total) by mouth 2 (two) times daily. Patient taking differently: Take 20 g by mouth 2 (two) times daily.    240 mL   0   . levofloxacin (LEVAQUIN) 250 MG tablet   Oral   Take 1 tablet (250 mg total) by mouth daily.   6 tablet   0   . omeprazole (PRILOSEC) 20 MG capsule   Oral   Take 1 capsule (20 mg total) by mouth 2 (two) times daily before a meal.   60 capsule   1   . rifaximin (XIFAXAN) 550 MG TABS tablet   Oral   Take 1 tablet (550 mg total) by mouth 2 (two) times daily.   60 tablet   1   . sertraline (ZOLOFT) 50 MG tablet   Oral   Take 50 mg by mouth daily.           Allergies Actos; Aminophylline; Amitriptyline; Ampicillin; Hctz; Naproxen; and Penicillins  Family History  Problem Relation Age of Onset  . Kidney failure Sister     CKD  . Diabetes Mellitus II Sister     Social History Social History  Substance Use Topics  . Smoking status: Never Smoker   . Smokeless tobacco:  Never Used  . Alcohol Use: No    Review of Systems  Constitutional: Negative for fever. Eyes: Negative for visual  changes. ENT: Negative for sore throat Cardiovascular: Negative for chest pain. Respiratory: Negative for shortness of breath. Gastrointestinal: Positive for nausea vomiting and diarrhea Genitourinary: Negative for dysuria. Musculoskeletal: Negative for back pain. Skin: Negative for rash. Neurological: Negative for headaches or focal weakness     ____________________________________________   PHYSICAL EXAM:  VITAL SIGNS: ED Triage Vitals  Enc Vitals Group     BP 09/15/15 2049 127/58 mmHg     Pulse Rate 09/15/15 2049 89     Resp 09/15/15 2049 17     Temp 09/15/15 2049 98.5 F (36.9 C)     Temp src --      SpO2 09/15/15 2049 99 %     Weight 09/15/15 2049 186 lb (84.369 kg)     Height 09/15/15 2049 5\' 4"  (1.626 m)     Head Cir --      Peak Flow --      Pain Score 09/15/15 2051 7     Pain Loc --      Pain Edu? --      Excl. in [place]? --      Constitutional: Alert and oriented. Jaundiced Eyes: Conjunctivae are icteric ENT   Head: Normocephalic and atraumatic.   Mouth/Throat: Mucous membranes are moist. Cardiovascular: Normal rate, regular rhythm. Normal and symmetric distal pulses are present in all extremities. No murmurs, rubs, or gallops. Respiratory: Normal respiratory effort without tachypnea nor retractions. Breath sounds are clear and equal bilaterally.  Gastrointestinal: Soft and non-tender in all quadrants. No distention. There is no CVA tenderness. Genitourinary: deferred Musculoskeletal: Nontender with normal range of motion in all extremities. No lower extremity tenderness nor edema. Neurologic:  Normal speech and language. No gross focal neurologic deficits are appreciated. Skin:  Skin is warm, dry and intact. No rash noted. Psychiatric: Mood and affect are normal. Patient exhibits appropriate insight and  judgment.  ____________________________________________    LABS (pertinent positives/negatives)  Labs Reviewed  CBC - Abnormal; Notable for the following:    RBC 2.53 (*)    Hemoglobin 8.6 (*)    HCT 25.1 (*)    MCH 34.1 (*)    RDW 22.7 (*)    Platelets 43 (*)    All other components within normal limits  COMPREHENSIVE METABOLIC PANEL - Abnormal; Notable for the following:    Glucose, Bld 115 (*)    BUN 24 (*)    Calcium 8.8 (*)    Albumin 2.8 (*)    AST 110 (*)    Alkaline Phosphatase 164 (*)    Total Bilirubin 8.9 (*)    GFR calc non Af Amer 59 (*)    All other components within normal limits  LIPASE, BLOOD    ____________________________________________   EKG  ED ECG REPORT I, Lavonia Drafts, the attending physician, personally viewed and interpreted this ECG.   Date: 09/15/2015  EKG Time: 8:48 PM  Rate: 89  Rhythm: normal sinus rhythm  Axis: Normal  Intervals:Prolonged QT  ST&T Change: None   ____________________________________________    RADIOLOGY I have personally reviewed any xrays that were ordered on this patient: None  ____________________________________________   PROCEDURES  Procedure(s) performed: none  Critical Care performed: none  ____________________________________________   INITIAL IMPRESSION / ASSESSMENT AND PLAN / ED COURSE  Pertinent labs & imaging results that were available during my care of the patient were reviewed by me and considered in my medical decision making (see chart for details).  Patient received IV Zofran by EMS. QTC is somewhat prolonged  so we will avoid Zofran in emergency department. We will give fluids and Phenergan and reevaluate  ----------------------------------------- 11:03 PM on 09/15/2015 -----------------------------------------  Patient is feeling significantly better. He has not vomited in the last hour. His daughter agrees that he does feel somewhat better. He would prefer to go home and  I think that is probably appropriate. We will wait until he finishes his second liter and reevaluate  ____________________________________________   FINAL CLINICAL IMPRESSION(S) / ED DIAGNOSES  Final diagnoses:  Nausea vomiting and diarrhea     Lavonia Drafts, MD 09/15/15 2304

## 2015-09-16 MED ORDER — SODIUM CHLORIDE 0.9 % IV BOLUS (SEPSIS)
500.0000 mL | Freq: Once | INTRAVENOUS | Status: AC
  Administered 2015-09-16: 500 mL via INTRAVENOUS

## 2015-09-16 MED ORDER — ONDANSETRON 4 MG PO TBDP: 4.0000 mg | ORAL_TABLET | Freq: Three times a day (TID) | ORAL | Status: DC | PRN

## 2015-09-16 NOTE — Consult Note (Signed)
Reason for Consult: Coordination of care Referring Physician: Lavonia Drafts M.D.  Luke HYPPOLITE Sr. is an 69 y.o. male.  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 Patient stated that there has been no blood in his stool and that his emesis has been yellow in color contents of his stomach following a meal. He has seen no bilious vomit. She denies fever, chest pain or shortness of breath. In the emergency department patient was hydrated with 2 L of normal saline and given anti-emetic medication. He has hospice care at home but his daughter was concerned that he may need more supportive care which prompted the emergency department staff to consult the hospitalist service for recommendations regarding inpatient versus outpatient management.  Past Medical History  Diagnosis Date  . Stroke   . Hypertension   . COPD (chronic obstructive pulmonary disease)   . Diabetes mellitus without complication   . H/O hiatal hernia   . Depression   . Arthritis   . Hepatitis     NASH  . Cirrhosis of liver not due to alcohol   . Obstructive sleep apnea 11/20/2014  . Hyperlipidemia   . Hiatal hernia   . GI bleeds, multiple during admission   . Restless leg syndrome     Past Surgical History  Procedure Laterality Date  . Nose surgery    . Appendectomy    . Cardiac catheterization    . Eye surgery      leaglly blind  . Anterior cervical decompression/discectomy fusion 4 levels N/A 11/13/2014    Procedure:  Cervical three-four, Cervical four-five, Cervical five-six, Cervical six- seven anterior cervical decompression with fusion and interbody prosthesis plating and bonegraft;  Surgeon: Consuella Lose, MD;  Location: [facility];  Service: Neurosurgery;  Laterality: N/A;  . Esophagogastroduodenoscopy N/A 08/23/2015    Procedure:  ESOPHAGOGASTRODUODENOSCOPY (EGD);  Surgeon: Manya Silvas, MD;  Location: [facility] END[facility];  Service: Endoscopy;  Laterality: N/A;  . Esophagogastroduodenoscopy (egd) with propofol N/A 07/20/2015    Procedure: ESOPHAGOGASTRODUODENOSCOPY (EGD) WITH PROPOFOL;  Surgeon: Lucilla Lame, MD;  Location: [facility] ENDOSCOPY;  Service: Endoscopy;  Laterality: N/A;    Family History  Problem Relation Age of Onset  . Kidney failure Sister     CKD  . Diabetes Mellitus II Sister     Social History:  reports that he has never smoked. He has never used smokeless tobacco. He reports that he does not drink alcohol. His drug history is not on file.  Allergies:  Allergies  Allergen Reactions  . Actos [Pioglitazone] Anaphylaxis  . Aminophylline Other (See Comments)    Reaction: Unknown  . Amitriptyline Other (See Comments)    Reaction: Unknown   . Ampicillin Hives  . Hctz [Hydrochlorothiazide] Other (See Comments)    Reaction: Unknown   . Naproxen Other (See Comments)    Reaction:  GI bleeding   . Penicillins Rash    Prior to Admission medications   Medication Sig Start Date End Date Taking? Authorizing Provider  acetaminophen (TYLENOL) 325 MG tablet Take 325-650 mg by mouth every 4 (four) hours as needed for mild pain.     Historical Provider, MD  allopurinol (ZYLOPRIM) 100 MG tablet Take 1 tablet (100 mg total) by mouth 2 (two) times daily. 11/30/14   Lavon Paganini Angiulli, PA-C  brimonidine-timolol (COMBIGAN) 0.2-0.5 % ophthalmic solution Place 1 drop  into the left eye 2 (two) times daily.     Historical Provider, MD  Carboxymethylcellul-Glycerin (REFRESH OPTIVE) 1-0.9 % GEL Apply 1 drop to eye 4 (four) times daily. Pt uses in the left eye.    Historical Provider, MD  Cholecalciferol (VITAMIN D-3) 1000 UNITS CAPS Take 1 capsule (1,000 Units total) by mouth daily. 11/30/14   Lavon Paganini Angiulli, PA-C  cholestyramine Lucrezia Starch) 4 G packet Take 1 packet (4 g total) by mouth 3 (three) times daily. 11/30/14    Lavon Paganini Angiulli, PA-C  colchicine 0.6 MG tablet Take 0.6 mg by mouth daily as needed (for gout flares).    Historical Provider, MD  ferrous sulfate 325 (65 FE) MG tablet Take 1 tablet (325 mg total) by mouth 3 (three) times daily with meals. 11/30/14   Lavon Paganini Angiulli, PA-C  furosemide (LASIX) 40 MG tablet Take 1 tablet (40 mg total) by mouth daily. 11/30/14   Daniel J Angiulli, PA-C  guaiFENesin-codeine (CHERATUSSIN AC) 100-10 MG/5ML syrup Take 5 mLs by mouth every 4 (four) hours as needed for cough. 09/09/15   Leia Alf, MD  hydrOXYzine (ATARAX/VISTARIL) 25 MG tablet 1/2 to 1 tablet tid as needed for itching 09/09/15   Leia Alf, MD  insulin aspart (NOVOLOG) 100 UNIT/ML injection Inject 10 Units into the skin 3 (three) times daily with meals. 08/29/15   Epifanio Lesches, MD  insulin detemir (LEVEMIR) 100 UNIT/ML injection Inject 0.1 mLs (10 Units total) into the skin at bedtime. 08/27/15   Epifanio Lesches, MD  insulin detemir (LEVEMIR) 100 UNIT/ML injection Inject 0.15 mLs (15 Units total) into the skin at bedtime. 08/29/15   Epifanio Lesches, MD  lactulose (CHRONULAC) 10 GM/15ML solution Take 45 mLs (30 g total) by mouth 2 (two) times daily. Patient taking differently: Take 20 g by mouth 2 (two) times daily.  11/30/14   Lavon Paganini Angiulli, PA-C  levofloxacin (LEVAQUIN) 250 MG tablet Take 1 tablet (250 mg total) by mouth daily. 09/09/15   Leia Alf, MD  omeprazole (PRILOSEC) 20 MG capsule Take 1 capsule (20 mg total) by mouth 2 (two) times daily before a meal. 11/30/14   Lavon Paganini Angiulli, PA-C  ondansetron (ZOFRAN ODT) 4 MG disintegrating tablet Take 1 tablet (4 mg total) by mouth every 8 (eight) hours as needed for nausea or vomiting. 09/16/15   Loney Hering, MD  rifaximin (XIFAXAN) 550 MG TABS tablet Take 1 tablet (550 mg total) by mouth 2 (two) times daily. 11/30/14   Lavon Paganini Angiulli, PA-C  sertraline (ZOLOFT) 50 MG tablet Take 50 mg by mouth daily.    Historical  Provider, MD     Results for orders placed or performed during the hospital encounter of 09/15/15 (from the past 48 hour(s))  CBC     Status: Abnormal   Collection Time: 09/15/15  9:12 PM  Result Value Ref Range   WBC 4.1 3.8 - 10.6 K/uL   RBC 2.53 (L) 4.40 - 5.90 MIL/uL   Hemoglobin 8.6 (L) 13.0 - 18.0 g/dL   HCT 25.1 (L) 40.0 - 52.0 %   MCV 99.0 80.0 - 100.0 fL   MCH 34.1 (H) 26.0 - 34.0 pg   MCHC 34.4 32.0 - 36.0 g/dL   RDW 22.7 (H) 11.5 - 14.5 %   Platelets 43 (L) 150 - 440 K/uL  Comprehensive metabolic panel     Status: Abnormal   Collection Time: 09/15/15  9:12 PM  Result Value Ref Range   Sodium 136 135 - 145  mmol/L   Potassium 3.7 3.5 - 5.1 mmol/L   Chloride 103 101 - 111 mmol/L   CO2 24 22 - 32 mmol/L   Glucose, Bld 115 (H) 65 - 99 mg/dL   BUN 24 (H) 6 - 20 mg/dL   Creatinine, Ser 1.23 0.61 - 1.24 mg/dL   Calcium 8.8 (L) 8.9 - 10.3 mg/dL   Total Protein 8.1 6.5 - 8.1 g/dL   Albumin 2.8 (L) 3.5 - 5.0 g/dL   AST 110 (H) 15 - 41 U/L   ALT 38 17 - 63 U/L   Alkaline Phosphatase 164 (H) 38 - 126 U/L   Total Bilirubin 8.9 (H) 0.3 - 1.2 mg/dL   GFR calc non Af Amer 59 (L) >60 mL/min   GFR calc Af Amer >60 >60 mL/min    Comment: (NOTE) The eGFR has been calculated using the CKD EPI equation. This calculation has not been validated in all clinical situations. eGFR's persistently <60 mL/min signify possible Chronic Kidney Disease.    Anion gap 9 5 - 15  Lipase, blood     Status: None   Collection Time: 09/15/15  9:12 PM  Result Value Ref Range   Lipase 33 22 - 51 U/L    No results found.  Review of Systems  Constitutional: Negative for fever and chills.  HENT: Negative for sore throat and tinnitus.   Eyes: Negative for blurred vision and redness.  Respiratory: Negative for cough and shortness of breath.   Cardiovascular: Negative for chest pain, palpitations, orthopnea and PND.  Gastrointestinal: Positive for nausea and diarrhea. Negative for vomiting and  abdominal pain.  Genitourinary: Negative for dysuria, urgency and frequency.  Musculoskeletal: Negative for myalgias and joint pain.  Skin: Negative for rash.       No lesions  Neurological: Negative for speech change, focal weakness and weakness.  Endo/Heme/Allergies: Does not bruise/bleed easily.       No temperature intolerance  Psychiatric/Behavioral: Negative for depression and suicidal ideas.   Blood pressure 125/62, pulse 91, temperature 98.5 F (36.9 C), resp. rate 15, height 5' 4" (1.626 m), weight 84.369 kg (186 lb), SpO2 94 %. Physical Exam  Nursing note and vitals reviewed. Constitutional: He is oriented to person, place, and time. He appears well-developed and well-nourished. No distress.  HENT:  Head: Normocephalic and atraumatic.  Mouth/Throat: Oropharynx is clear and moist.  Eyes: EOM are normal. Pupils are equal, round, and reactive to light. Scleral icterus is present.  Neck: Normal range of motion. Neck supple. No JVD present. No tracheal deviation present. No thyromegaly present.  Cardiovascular: Regular rhythm and normal heart sounds.  Tachycardia present.  Exam reveals no gallop and no friction rub.   No murmur heard. Respiratory: Effort normal and breath sounds normal. No respiratory distress.  GI: Soft. Bowel sounds are normal. He exhibits no distension. There is no tenderness.  ascites  Genitourinary:  Deferred  Musculoskeletal: Normal range of motion. He exhibits no edema.  Lymphadenopathy:    He has no cervical adenopathy.  Neurological: He is alert and oriented to person, place, and time. No cranial nerve deficit.  Skin: Skin is warm and dry.  Whole-body jaundice  Psychiatric: He has a normal mood and affect. His behavior is normal. Judgment and thought content normal.    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 Initially the patient reports that he was feeling weak but seems to  feel better now after 2 L of fluid. He is no longer vomiting or having diarrhea. The patient's daughter states that she had stopped giving him lactulose for a few days and has been giving him Imodium at times. The patient does not have signs or symptoms of infectious diarrhea. On further questioning the patient revealed that prior to the onset of his nausea, vomiting and diarrhea he had a meal consisting of country fried steak which no doubt worsened his cholestasis. I have advised a diet free of fried foods. I've also asked emergency department staff to print dietary guidelines with the patient's discharge paperwork informing him about the fact content of foods that he may like to eat.  Also explained BRAT diet and recommended very gradual introduction of dairy into his diet.  I have also suggested alternating Immodium and lactulose at times when his stool is looser than desired.  I have discussed with the patient that he has end-stage liver failure and that he may want to eat foods for pleasure that he should be aware will cause the symptoms he presented with tonight.  He expressed understanding.  Will give the patient some more intravenous hydration before discharge home.  Requested Zofran ODT prescription upon discharge.  [name] 09/16/2015, 1:24 AM

## 2015-09-16 NOTE — ED Provider Notes (Signed)
-----------------------------------------   1:02 AM on 09/16/2015 -----------------------------------------   Blood pressure 109/58, pulse 87, temperature 98.5 F (36.9 C), resp. rate 16, height 5\' 4"  (1.626 m), weight 186 lb (84.369 kg), SpO2 95 %.  Assuming care from Dr. Corky Downs.  In short, Luke MUSSON Sr. is a 69 y.o. male with a chief complaint of Nausea; Emesis; and Diarrhea .  Refer to the original H&P for additional details.  The current plan of care is to follow-up the prime doc recommendations.  The patient was seen by the hospitalist to evaluate for admission. The hospitalist discussed with the patient and the patient and his daughter feel comfortable being discharged home. The patient feels improved and does not want to stay in the hospital. They did recommend liver failure dietary recommendations as well as Zofran for home. I will discharge the patient to home and have him follow-up with his primary care physician.   Loney Hering, MD 09/16/15 [id number]

## 2015-09-16 NOTE — Discharge Instructions (Signed)

## 2015-09-18 ENCOUNTER — Emergency Department
Admission: EM | Admit: 2015-09-18 | Discharge: 2015-09-18 | Disposition: A | Discharge status: Home / Self Care | Source: Home / Self Care | Attending: Emergency Medicine | Admitting: Emergency Medicine

## 2015-09-18 ENCOUNTER — Encounter: Payer: Self-pay | Admitting: Internal Medicine

## 2015-09-18 ENCOUNTER — Telehealth: Payer: Self-pay | Admitting: *Deleted

## 2015-09-18 ENCOUNTER — Encounter: Payer: Self-pay | Admitting: *Deleted

## 2015-09-18 ENCOUNTER — Inpatient Hospital Stay

## 2015-09-18 ENCOUNTER — Inpatient Hospital Stay (HOSPITAL_BASED_OUTPATIENT_CLINIC_OR_DEPARTMENT_OTHER): Admitting: Internal Medicine

## 2015-09-18 VITALS — BP 116/64 | HR 88 | Temp 96.2°F | Resp 18

## 2015-09-18 DIAGNOSIS — J449 Chronic obstructive pulmonary disease, unspecified: Secondary | ICD-10-CM

## 2015-09-18 DIAGNOSIS — E780 Pure hypercholesterolemia: Secondary | ICD-10-CM

## 2015-09-18 DIAGNOSIS — R1011 Right upper quadrant pain: Secondary | ICD-10-CM

## 2015-09-18 DIAGNOSIS — G2581 Restless legs syndrome: Secondary | ICD-10-CM

## 2015-09-18 DIAGNOSIS — R188 Other ascites: Secondary | ICD-10-CM

## 2015-09-18 DIAGNOSIS — R531 Weakness: Secondary | ICD-10-CM

## 2015-09-18 DIAGNOSIS — I10 Essential (primary) hypertension: Secondary | ICD-10-CM

## 2015-09-18 DIAGNOSIS — Z794 Long term (current) use of insulin: Secondary | ICD-10-CM

## 2015-09-18 DIAGNOSIS — D649 Anemia, unspecified: Secondary | ICD-10-CM

## 2015-09-18 DIAGNOSIS — G4730 Sleep apnea, unspecified: Secondary | ICD-10-CM

## 2015-09-18 DIAGNOSIS — D638 Anemia in other chronic diseases classified elsewhere: Secondary | ICD-10-CM

## 2015-09-18 DIAGNOSIS — M129 Arthropathy, unspecified: Secondary | ICD-10-CM

## 2015-09-18 DIAGNOSIS — Z79899 Other long term (current) drug therapy: Secondary | ICD-10-CM

## 2015-09-18 DIAGNOSIS — K922 Gastrointestinal hemorrhage, unspecified: Secondary | ICD-10-CM

## 2015-09-18 DIAGNOSIS — K7460 Unspecified cirrhosis of liver: Secondary | ICD-10-CM | POA: Diagnosis not present

## 2015-09-18 DIAGNOSIS — D61818 Other pancytopenia: Secondary | ICD-10-CM

## 2015-09-18 DIAGNOSIS — R160 Hepatomegaly, not elsewhere classified: Secondary | ICD-10-CM | POA: Diagnosis not present

## 2015-09-18 DIAGNOSIS — K766 Portal hypertension: Secondary | ICD-10-CM

## 2015-09-18 DIAGNOSIS — C220 Liver cell carcinoma: Secondary | ICD-10-CM

## 2015-09-18 DIAGNOSIS — K449 Diaphragmatic hernia without obstruction or gangrene: Secondary | ICD-10-CM

## 2015-09-18 DIAGNOSIS — E119 Type 2 diabetes mellitus without complications: Secondary | ICD-10-CM

## 2015-09-18 DIAGNOSIS — F329 Major depressive disorder, single episode, unspecified: Secondary | ICD-10-CM

## 2015-09-18 DIAGNOSIS — Z8673 Personal history of transient ischemic attack (TIA), and cerebral infarction without residual deficits: Secondary | ICD-10-CM

## 2015-09-18 DIAGNOSIS — R161 Splenomegaly, not elsewhere classified: Secondary | ICD-10-CM

## 2015-09-18 HISTORY — DX: Malignant (primary) neoplasm, unspecified: C80.1

## 2015-09-18 LAB — HEPATIC FUNCTION PANEL
ALBUMIN: 2.6 g/dL — AB (ref 3.5–5.0)
ALK PHOS: 161 U/L — AB (ref 38–126)
ALT: 38 U/L (ref 17–63)
AST: 97 U/L — ABNORMAL HIGH (ref 15–41)
BILIRUBIN TOTAL: 8.4 mg/dL — AB (ref 0.3–1.2)
Bilirubin, Direct: 4.4 mg/dL — ABNORMAL HIGH (ref 0.1–0.5)
Indirect Bilirubin: 4 mg/dL — ABNORMAL HIGH (ref 0.3–0.9)
Total Protein: 7.8 g/dL (ref 6.5–8.1)

## 2015-09-18 LAB — TYPE AND SCREEN
ABO/RH(D): AB POS
ANTIBODY SCREEN: NEGATIVE

## 2015-09-18 LAB — SAMPLE TO BLOOD BANK

## 2015-09-18 LAB — CBC WITH DIFFERENTIAL/PLATELET
Basophils Absolute: 0 10*3/uL (ref 0–0.1)
Basophils Relative: 1 %
EOS ABS: 0.1 10*3/uL (ref 0–0.7)
EOS PCT: 5 %
HCT: 23.2 % — ABNORMAL LOW (ref 40.0–52.0)
Hemoglobin: 7.8 g/dL — ABNORMAL LOW (ref 13.0–18.0)
LYMPHS ABS: 0.4 10*3/uL — AB (ref 1.0–3.6)
LYMPHS PCT: 13 %
MCH: 33.3 pg (ref 26.0–34.0)
MCHC: 33.7 g/dL (ref 32.0–36.0)
MCV: 98.8 fL (ref 80.0–100.0)
MONOS PCT: 8 %
Monocytes Absolute: 0.2 10*3/uL (ref 0.2–1.0)
Neutro Abs: 2.2 10*3/uL (ref 1.4–6.5)
Neutrophils Relative %: 73 %
PLATELETS: 39 10*3/uL — AB (ref 150–440)
RBC: 2.35 MIL/uL — ABNORMAL LOW (ref 4.40–5.90)
RDW: 23 % — ABNORMAL HIGH (ref 11.5–14.5)
WBC: 2.9 10*3/uL — ABNORMAL LOW (ref 3.8–10.6)

## 2015-09-18 LAB — CREATININE, SERUM
CREATININE: 1 mg/dL (ref 0.61–1.24)
GFR calc Af Amer: >60 mL/min (ref >60)
GFR calc non Af Amer: >60 mL/min (ref >60)

## 2015-09-18 LAB — COMPREHENSIVE METABOLIC PANEL
ALK PHOS: 146 U/L — AB (ref 38–126)
ALT: 34 U/L (ref 17–63)
ANION GAP: 8 (ref 5–15)
AST: 95 U/L — ABNORMAL HIGH (ref 15–41)
Albumin: 2.6 g/dL — ABNORMAL LOW (ref 3.5–5.0)
BILIRUBIN TOTAL: 8 mg/dL — AB (ref 0.3–1.2)
BUN: 17 mg/dL (ref 6–20)
CALCIUM: 8.8 mg/dL — AB (ref 8.9–10.3)
CO2: 27 mmol/L (ref 22–32)
CREATININE: 0.97 mg/dL (ref 0.61–1.24)
Chloride: 102 mmol/L (ref 101–111)
GFR calc non Af Amer: >60 mL/min (ref >60)
Glucose, Bld: 204 mg/dL — ABNORMAL HIGH (ref 65–99)
Potassium: 3.6 mmol/L (ref 3.5–5.1)
SODIUM: 137 mmol/L (ref 135–145)
TOTAL PROTEIN: 7.1 g/dL (ref 6.5–8.1)

## 2015-09-18 LAB — CBC
HCT: 21.6 % — ABNORMAL LOW (ref 40.0–52.0)
Hemoglobin: 7.4 g/dL — ABNORMAL LOW (ref 13.0–18.0)
MCH: 34.2 pg — ABNORMAL HIGH (ref 26.0–34.0)
MCHC: 34.1 g/dL (ref 32.0–36.0)
MCV: 100 fL (ref 80.0–100.0)
PLATELETS: 38 10*3/uL — AB (ref 150–440)
RBC: 2.16 MIL/uL — ABNORMAL LOW (ref 4.40–5.90)
RDW: 23.1 % — AB (ref 11.5–14.5)
WBC: 3 10*3/uL — AB (ref 3.8–10.6)

## 2015-09-18 LAB — PROTIME-INR
INR: 1.69
Prothrombin Time: 20.1 seconds — ABNORMAL HIGH (ref 11.4–15.0)

## 2015-09-18 MED ORDER — FAMOTIDINE 20 MG PO TABS
40.0000 mg | ORAL_TABLET | ORAL | Status: AC
  Administered 2015-09-18: 40 mg via ORAL
  Filled 2015-09-18: qty 2

## 2015-09-18 MED ORDER — BENZONATATE 100 MG PO CAPS: 100.0000 mg | ORAL_CAPSULE | Freq: Three times a day (TID) | ORAL | Status: DC

## 2015-09-18 NOTE — ED Notes (Signed)
MD at bedside for reeval

## 2015-09-18 NOTE — Progress Notes (Signed)
Pt here and feels more jaundice, sugars have been up in evening and at night and eh was taken off the metformin due to liq. Stools. Pt's wife has call in to dr Gabriel Carina about what to do for sugars. Still has dry cough and the cheratussin not helping. Pt does not have much pain he states and not hurting at all right now.  He does feel stomach area little tight.  He did go to ER on Sunday and got IVF and nausea med.

## 2015-09-18 NOTE — Telephone Encounter (Signed)
Electronic sent in rx for tessalon pearls. Got permission from hospice to pay for it.

## 2015-09-18 NOTE — ED Notes (Signed)
Assumed pt care at this time. NAD noted. RR even and nonlabored. Will continue to monitor. 

## 2015-09-18 NOTE — ED Notes (Signed)
Pt from home.  States that he is under the care of hospice for liver CA.  Pt states that he had a very large bloody BM.  Denies pain.  Reports that the hospice nurse advised him to come to the ED for further eval.

## 2015-09-18 NOTE — ED Provider Notes (Addendum)
[facility] Emergency Department Provider Note REMINDER - THIS NOTE IS NOT A FINAL MEDICAL RECORD UNTIL IT IS SIGNED. UNTIL THEN, THE CONTENT BELOW MAY REFLECT INFORMATION FROM A DOCUMENTATION TEMPLATE, NOT THE ACTUAL PATIENT VISIT. ____________________________________________  Time seen: Approximately 8:20 PM  I have reviewed the triage vital signs and the nursing notes.   HISTORY  Chief Complaint Blood In Stools    HPI Luke MARAJ Sr. is a 69 y.o. male long history of medical problems including cirrhosis and gastropathy.He presents today as he noticed an increased amount of blood in his stool today. He was seen by his doctor today, and is noticed that he has painless bleeding from the rectum. He and his daughter report that this is not uncommon, but the hospice nurse advised him to calm and be checked out in the ER. He denies feeling any increase in fatigue, no abdominal pain, no fevers, I reports that he had one loose stool that was brown with some mixed blood. He denies ongoing loose or bloody stool this time.  The patient does note that he is on hospice and when generally like to avoid being admitted to the hospital unless absolutely necessary.  Past Medical History  Diagnosis Date  . Stroke   . Hypertension   . COPD (chronic obstructive pulmonary disease)   . Diabetes mellitus without complication   . H/O hiatal hernia   . Depression   . Arthritis   . Hepatitis     NASH  . Cirrhosis of liver not due to alcohol   . Obstructive sleep apnea 11/20/2014  . Hyperlipidemia   . Hiatal hernia   . GI bleeds, multiple during admission   . Restless leg syndrome   . Cancer     liver    Patient Active Problem List   Diagnosis Date Noted  . Anemia with chronic illness 08/22/2015  . Pancytopenia 08/09/2015  . SDH (subdural hematoma) 08/09/2015  . Gout 08/09/2015  . Hypertension   . Cirrhosis of liver not due to alcohol   . Hyperlipidemia   . Iron  deficiency anemia   . Gastric polyp   . Upper GI bleed 07/19/2015  . UTI (lower urinary tract infection) 11/23/2014  . Thrombocytopenia 11/23/2014  . Anemia 11/23/2014  . NASH (nonalcoholic steatohepatitis) 11/21/2014  . Stenosis of cervical spine region 11/20/2014  . Type 2 diabetes mellitus, uncontrolled, with retinopathy 11/20/2014  . History of COPD 11/20/2014  . Diabetes mellitus with diabetic retinopathy   . Acute respiratory acidosis   . Encounter for intubation   . Encounter for orogastric (OG) tube placement   . Surgery, other elective   . Cervical spondylosis with myelopathy 11/13/2014    Past Surgical History  Procedure Laterality Date  . Nose surgery    . Appendectomy    . Cardiac catheterization    . Eye surgery      leaglly blind  . Anterior cervical decompression/discectomy fusion 4 levels N/A 11/13/2014    Procedure:  Cervical three-four, Cervical four-five, Cervical five-six, Cervical six- seven anterior cervical decompression with fusion and interbody prosthesis plating and bonegraft;  Surgeon: Consuella Lose, MD;  Location: [place] [facility];  Service: Neurosurgery;  Laterality: N/A;  . Esophagogastroduodenoscopy N/A 08/23/2015    Procedure: ESOPHAGOGASTRODUODENOSCOPY (EGD);  Surgeon: Manya Silvas, MD;  Location: [facility] ENDOSCOPY;  Service: Endoscopy;  Laterality: N/A;  . Esophagogastroduodenoscopy (egd) with propofol N/A 07/20/2015    Procedure: ESOPHAGOGASTRODUODENOSCOPY (EGD) WITH PROPOFOL;  Surgeon: Lucilla Lame, MD;  Location: [facility] ENDOSCOPY;  Service: Endoscopy;  Laterality: N/A;    Current Outpatient Rx  Name  Route  Sig  Dispense  Refill  . acetaminophen (TYLENOL) 325 MG tablet   Oral   Take 325-650 mg by mouth every 4 (four) hours as needed for mild pain.          Marland Kitchen allopurinol (ZYLOPRIM) 100 MG tablet   Oral   Take 1 tablet (100 mg total) by mouth 2 (two) times daily.   60 tablet   1   . benzonatate (TESSALON) 100 MG capsule   Oral   Take 1  capsule (100 mg total) by mouth 3 (three) times daily. Pt is hospice pt and they have given permission over the phone to cover med   45 capsule   2   . brimonidine-timolol (COMBIGAN) 0.2-0.5 % ophthalmic solution   Left Eye   Place 1 drop into the left eye 2 (two) times daily.          . Carboxymethylcellul-Glycerin (REFRESH OPTIVE) 1-0.9 % GEL   Ophthalmic   Apply 1 drop to eye 4 (four) times daily. Pt uses in the left eye.         . Cholecalciferol (VITAMIN D-3) 1000 UNITS CAPS   Oral   Take 1 capsule (1,000 Units total) by mouth daily.   30 capsule   1   . cholestyramine (QUESTRAN) 4 G packet   Oral   Take 1 packet (4 g total) by mouth 3 (three) times daily.   60 each   12   . colchicine 0.6 MG tablet   Oral   Take 0.6 mg by mouth daily as needed (for gout flares).         . ferrous sulfate 325 (65 FE) MG tablet   Oral   Take 1 tablet (325 mg total) by mouth 3 (three) times daily with meals.   90 tablet   3   . furosemide (LASIX) 40 MG tablet   Oral   Take 1 tablet (40 mg total) by mouth daily.   30 tablet   1   . guaiFENesin-codeine (CHERATUSSIN AC) 100-10 MG/5ML syrup   Oral   Take 5 mLs by mouth every 4 (four) hours as needed for cough.   280 mL   1   . hydrOXYzine (ATARAX/VISTARIL) 25 MG tablet      1/2 to 1 tablet tid as needed for itching   42 tablet   1   . insulin aspart (NOVOLOG) 100 UNIT/ML injection   Subcutaneous   Inject 10 Units into the skin 3 (three) times daily with meals.   10 mL   11   . insulin detemir (LEVEMIR) 100 UNIT/ML injection   Subcutaneous   Inject 0.1 mLs (10 Units total) into the skin at bedtime.   10 mL   11   . insulin detemir (LEVEMIR) 100 UNIT/ML injection   Subcutaneous   Inject 0.15 mLs (15 Units total) into the skin at bedtime.   10 mL   11   . lactulose (CHRONULAC) 10 GM/15ML solution   Oral   Take 45 mLs (30 g total) by mouth 2 (two) times daily. Patient taking differently: Take 20 g by mouth 2  (two) times daily.    240 mL   0   . omeprazole (PRILOSEC) 20 MG capsule   Oral   Take 1 capsule (20 mg total) by mouth 2 (two) times daily before a meal.   60 capsule  1   . ondansetron (ZOFRAN ODT) 4 MG disintegrating tablet   Oral   Take 1 tablet (4 mg total) by mouth every 8 (eight) hours as needed for nausea or vomiting.   20 tablet   0   . rifaximin (XIFAXAN) 550 MG TABS tablet   Oral   Take 1 tablet (550 mg total) by mouth 2 (two) times daily.   60 tablet   1   . sertraline (ZOLOFT) 50 MG tablet   Oral   Take 50 mg by mouth daily.           Allergies Actos; Aminophylline; Amitriptyline; Ampicillin; Hctz; Naproxen; and Penicillins  Family History  Problem Relation Age of Onset  . Kidney failure Sister     CKD  . Diabetes Mellitus II Sister     Social History Social History  Substance Use Topics  . Smoking status: Never Smoker   . Smokeless tobacco: Never Used  . Alcohol Use: No    Review of Systems Constitutional: No fever/chills Eyes: No visual changes. ENT: No sore throat. Cardiovascular: Denies chest pain. Respiratory: Denies shortness of breath. Gastrointestinal: No abdominal pain.  No nausea, no vomiting.  No diarrhea.  No constipation. Genitourinary: Negative for dysuria. Musculoskeletal: Negative for back pain. Skin: Negative for rash. Neurological: Negative for headaches, focal weakness or numbness.  10-point ROS otherwise negative.  ____________________________________________   PHYSICAL EXAM:  VITAL SIGNS: ED Triage Vitals  Enc Vitals Group     BP 09/18/15 1628 147/67 mmHg     Pulse Rate 09/18/15 1628 92     Resp 09/18/15 1628 18     Temp 09/18/15 1628 98.1 F (36.7 C)     Temp Source 09/18/15 1628 Oral     SpO2 09/18/15 1628 98 %     Weight 09/18/15 1628 170 lb (77.111 kg)     Height 09/18/15 1628 5\' 4"  (1.626 m)     Head Cir --      Peak Flow --      Pain Score 09/18/15 1628 0     Pain Loc --      Pain Edu? --       Excl. in [place]? --    Constitutional: Alert and oriented. Chronically appearing and in no acute distress. He is very amicable Eyes: Conjunctivae are normal. PERRL. EOMI. Head: Atraumatic. Nose: No congestion/rhinnorhea. Mouth/Throat: Mucous membranes are moist.  Oropharynx non-erythematous. Neck: No stridor.   Cardiovascular: Normal rate, regular rhythm. Grossly normal heart sounds.  Good peripheral circulation. Respiratory: Normal respiratory effort.  No retractions. Lungs CTAB. Gastrointestinal: Soft and distended with evidence of ascites without tenderness or pain. No CVA tenderness. Rectal: Brown stool with slight amount of red gross blood. No significant or large hemorrhage. Musculoskeletal: No lower extremity tenderness nor edema.  No joint effusions. Neurologic:  Speech is somewhat slow, but oriented. He moves all extremities without distress. Skin:  Skin is warm, dry and intact. No rash noted. Psychiatric: Mood and affect are normal. Speech and behavior are normal.  ____________________________________________   LABS (all labs ordered are listed, but only abnormal results are displayed)  Labs Reviewed  CBC - Abnormal; Notable for the following:    WBC 3.0 (*)    RBC 2.16 (*)    Hemoglobin 7.4 (*)    HCT 21.6 (*)    MCH 34.2 (*)    RDW 23.1 (*)    Platelets 38 (*)    All other components within normal limits  PROTIME-INR - Abnormal;  Notable for the following:    Prothrombin Time 20.1 (*)    All other components within normal limits  COMPREHENSIVE METABOLIC PANEL - Abnormal; Notable for the following:    Glucose, Bld 204 (*)    Calcium 8.8 (*)    Albumin 2.6 (*)    AST 95 (*)    Alkaline Phosphatase 146 (*)    Total Bilirubin 8.0 (*)    All other components within normal limits  TYPE AND SCREEN    ____________________________________________  EKG   ____________________________________________  RADIOLOGY   ____________________________________________   PROCEDURES  Procedure(s) performed: None  Critical Care performed: No  ____________________________________________   INITIAL IMPRESSION / ASSESSMENT AND PLAN / ED COURSE  Pertinent labs & imaging results that were available during my care of the patient were reviewed by me and considered in my medical decision making (see chart for details).  Patient presents for lower GI bleeding. He is daughter reports that this is a very frequent occurrence for him, and they were revised to come by hospice. He reports that he is actually not very concerned about this and this occurs from week to week. He doesn't along history of GI bleeding, his hemoglobin today appears to be quite stable having been checked in the clinic and is only slightly decreasing now this evening.  Discussed with Dr. Allen Norris for gastroenterology who advises that given the patient's known history, hemodynamic stability, and that since his hemoglobins are quite stable at this time he could be discharged to follow-up. I discussed with the patient and his daughter, the patient is agreeable and would like to be able to go home today and will return emergency room right away if he develops any have the bleeding, ongoing large red or bloody stools, or other new concerns like weakness, chest pain, trouble breathing or other concerns arise.  He will return to hospice and states that they should be able to get follow-up tomorrow with them, as well as we'll contact his primary care doctor gastroenterology. He will continue on his omeprazole.  ----------------------------------------- 8:27 PM on 09/18/2015 -----------------------------------------  On reevaluation, the patient is resting comfortably and is no further bloody bowel movements. He is very comfortable the plan for  discharge along with daughter. ____________________________________________   FINAL CLINICAL IMPRESSION(S) / ED DIAGNOSES  Final diagnoses:  Gastrointestinal hemorrhage, unspecified gastritis, unspecified gastrointestinal hemorrhage type      Delman Kitten, MD 09/18/15 2027  Delman Kitten, MD 09/18/15 2028

## 2015-09-18 NOTE — Discharge Instructions (Signed)
The blood counts are fairly stable this evening. He did have a slight amount of blood in your stool but it appears this is fairly chronic. He have close follow-up care and we discussed following up with your hospice team tomorrow. If you develop a severe increase in bleeding, you notice pain, fever, feel weak, begin vomiting blood or other new concerns arise please return to emergency room right away.  Bloody Stools Bloody stools often mean that there is a problem in the digestive tract. Your caregiver may use the term "melena" to describe black, tarry, and bad smelling stools or "hematochezia" to describe red or maroon-colored stools. Blood seen in the stool can be caused by bleeding anywhere along the intestinal tract.  A black stool usually means that blood is coming from the upper part of the gastrointestinal tract (esophagus, stomach, or small bowel). Passing maroon-colored stools or bright red blood usually means that blood is coming from lower down in the large bowel or the rectum. However, sometimes massive bleeding in the stomach or small intestine can cause bright red bloody stools.  Consuming black licorice, lead, iron pills, medicines containing bismuth subsalicylate, or blueberries can also cause black stools. Your caregiver can test black stools to see if blood is present. It is important that the cause of the bleeding be found. Treatment can then be started, and the problem can be corrected. Rectal bleeding may not be serious, but you should not assume everything is okay until you know the cause.It is very important to follow up with your caregiver or a specialist in gastrointestinal problems. CAUSES  Blood in the stools can come from various underlying causes.Often, the cause is not found during your first visit. Testing is often needed to discover the cause of bleeding in the gastrointestinal tract. Causes range from simple to serious or even life-threatening.Possible causes  include:  Hemorrhoids.These are veins that are full of blood (engorged) in the rectum. They cause pain, inflammation, and may bleed.  Anal fissures.These are areas of painful tearing which may bleed. They are often caused by passing hard stool.  Diverticulosis.These are pouches that form on the colon over time, with age, and may bleed significantly.  Diverticulitis.This is inflammation in areas with diverticulosis. It can cause pain, fever, and bloody stools, although bleeding is rare.  Proctitis and colitis. These are inflamed areas of the rectum or colon. They may cause pain, fever, and bloody stools.  Polyps and cancer. Colon cancer is a leading cause of preventable cancer death.It often starts out as precancerous polyps that can be removed during a colonoscopy, preventing progression into cancer. Sometimes, polyps and cancer may cause rectal bleeding.  Gastritis and ulcers.Bleeding from the upper gastrointestinal tract (near the stomach) may travel through the intestines and produce black, sometimes tarry, often bad smelling stools. In certain cases, if the bleeding is fast enough, the stools may not be black, but red and the condition may be life-threatening. SYMPTOMS  You may have stools that are bright red and bloody, that are normal color with blood on them, or that are dark black and tarry. In some cases, you may only have blood in the toilet bowl. Any of these cases need medical care. You may also have:  Pain at the anus or anywhere in the rectum.  Lightheadedness or feeling faint.  Extreme weakness.  Nausea or vomiting.  Fever. DIAGNOSIS Your caregiver may use the following methods to find the cause of your bleeding:  Taking a medical history. Age is  important. Older people tend to develop polyps and cancer more often. If there is anal pain and a hard, large stool associated with bleeding, a tear of the anus may be the cause. If blood drips into the toilet after a bowel  movement, bleeding hemorrhoids may be the problem. The color and frequency of the bleeding are additional considerations. In most cases, the medical history provides clues, but seldom the final answer.  A visual and finger (digital) exam. Your caregiver will inspect the anal area, looking for tears and hemorrhoids. A finger exam can provide information when there is tenderness or a growth inside. In men, the prostate is also examined.  Endoscopy. Several types of small, long scopes (endoscopes) are used to view the colon.  In the office, your caregiver may use a rigid, or more commonly, a flexible viewing sigmoidoscope. This exam is called flexible sigmoidoscopy. It is performed in 5 to 10 minutes.  A more thorough exam is accomplished with a colonoscope. It allows your caregiver to view the entire 5 to 6 foot long colon. Medicine to help you relax (sedative) is usually given for this exam. Frequently, a bleeding lesion may be present beyond the reach of the sigmoidoscope. So, a colonoscopy may be the best exam to start with. Both exams are usually done on an outpatient basis. This means the patient does not stay overnight in the hospital or surgery center.  An upper endoscopy may be needed to examine your stomach. Sedation is used and a flexible endoscope is put in your mouth, down to your stomach.  A barium enema X-ray. This is an X-ray exam. It uses liquid barium inserted by enema into the rectum. This test alone may not identify an actual bleeding point. X-rays highlight abnormal shadows, such as those made by lumps (tumors), diverticuli, or colitis. TREATMENT  Treatment depends on the cause of your bleeding.   For bleeding from the stomach or colon, the caregiver doing your endoscopy or colonoscopy may be able to stop the bleeding as part of the procedure.  Inflammation or infection of the colon can be treated with medicines.  Many rectal problems can be treated with creams, suppositories,  or warm baths.  Surgery is sometimes needed.  Blood transfusions are sometimes needed if you have lost a lot of blood.  For any bleeding problem, let your caregiver know if you take aspirin or other blood thinners regularly. HOME CARE INSTRUCTIONS   Take any medicines exactly as prescribed.  Keep your stools soft by eating a diet high in fiber. Prunes (1 to 3 a day) work well for many people.  Drink enough water and fluids to keep your urine clear or pale yellow.  Take sitz baths if advised. A sitz bath is when you sit in a bathtub with warm water for 10 to 15 minutes to soak, soothe, and cleanse the rectal area.  If enemas or suppositories are advised, be sure you know how to use them. Tell your caregiver if you have problems with this.  Monitor your bowel movements to look for signs of improvement or worsening. SEEK MEDICAL CARE IF:   You do not improve in the time expected.  Your condition worsens after initial improvement.  You develop any new symptoms. SEEK IMMEDIATE MEDICAL CARE IF:   You develop severe or prolonged rectal bleeding.  You vomit blood.  You feel weak or faint.  You have a fever. MAKE SURE YOU:  Understand these instructions.  Will watch your condition.  Will get help right away if you are not doing well or get worse. Document Released: 11/27/2002 Document Revised: 02/29/2012 Document Reviewed: 04/24/2011 [facility] Patient Information 2015 [place], [place]. This information is not intended to replace advice given to you by your health care provider. Make sure you discuss any questions you have with your health care provider.

## 2015-09-19 ENCOUNTER — Inpatient Hospital Stay
Admission: EM | Admit: 2015-09-19 | Discharge: 2015-09-22 | DRG: 378 | Disposition: A | Discharge status: Hospice | Attending: Internal Medicine | Admitting: Internal Medicine

## 2015-09-19 ENCOUNTER — Other Ambulatory Visit: Payer: Self-pay

## 2015-09-19 ENCOUNTER — Encounter: Payer: Self-pay | Admitting: Emergency Medicine

## 2015-09-19 DIAGNOSIS — Z66 Do not resuscitate: Secondary | ICD-10-CM | POA: Diagnosis present

## 2015-09-19 DIAGNOSIS — I10 Essential (primary) hypertension: Secondary | ICD-10-CM | POA: Diagnosis present

## 2015-09-19 DIAGNOSIS — F329 Major depressive disorder, single episode, unspecified: Secondary | ICD-10-CM | POA: Diagnosis present

## 2015-09-19 DIAGNOSIS — R17 Unspecified jaundice: Secondary | ICD-10-CM

## 2015-09-19 DIAGNOSIS — D638 Anemia in other chronic diseases classified elsewhere: Secondary | ICD-10-CM | POA: Diagnosis present

## 2015-09-19 DIAGNOSIS — K921 Melena: Principal | ICD-10-CM | POA: Diagnosis present

## 2015-09-19 DIAGNOSIS — K7290 Hepatic failure, unspecified without coma: Secondary | ICD-10-CM | POA: Diagnosis present

## 2015-09-19 DIAGNOSIS — D62 Acute posthemorrhagic anemia: Secondary | ICD-10-CM | POA: Diagnosis present

## 2015-09-19 DIAGNOSIS — Z8673 Personal history of transient ischemic attack (TIA), and cerebral infarction without residual deficits: Secondary | ICD-10-CM

## 2015-09-19 DIAGNOSIS — Z794 Long term (current) use of insulin: Secondary | ICD-10-CM

## 2015-09-19 DIAGNOSIS — G4733 Obstructive sleep apnea (adult) (pediatric): Secondary | ICD-10-CM | POA: Diagnosis present

## 2015-09-19 DIAGNOSIS — E785 Hyperlipidemia, unspecified: Secondary | ICD-10-CM | POA: Diagnosis present

## 2015-09-19 DIAGNOSIS — K7469 Other cirrhosis of liver: Secondary | ICD-10-CM | POA: Diagnosis present

## 2015-09-19 DIAGNOSIS — K922 Gastrointestinal hemorrhage, unspecified: Secondary | ICD-10-CM | POA: Diagnosis present

## 2015-09-19 DIAGNOSIS — Z515 Encounter for palliative care: Secondary | ICD-10-CM | POA: Diagnosis not present

## 2015-09-19 DIAGNOSIS — R109 Unspecified abdominal pain: Secondary | ICD-10-CM

## 2015-09-19 DIAGNOSIS — D61818 Other pancytopenia: Secondary | ICD-10-CM | POA: Diagnosis present

## 2015-09-19 DIAGNOSIS — D500 Iron deficiency anemia secondary to blood loss (chronic): Secondary | ICD-10-CM | POA: Diagnosis present

## 2015-09-19 DIAGNOSIS — J449 Chronic obstructive pulmonary disease, unspecified: Secondary | ICD-10-CM | POA: Diagnosis present

## 2015-09-19 DIAGNOSIS — D649 Anemia, unspecified: Secondary | ICD-10-CM

## 2015-09-19 DIAGNOSIS — K7581 Nonalcoholic steatohepatitis (NASH): Secondary | ICD-10-CM | POA: Diagnosis present

## 2015-09-19 DIAGNOSIS — G2581 Restless legs syndrome: Secondary | ICD-10-CM | POA: Diagnosis present

## 2015-09-19 DIAGNOSIS — E119 Type 2 diabetes mellitus without complications: Secondary | ICD-10-CM | POA: Diagnosis present

## 2015-09-19 DIAGNOSIS — Z833 Family history of diabetes mellitus: Secondary | ICD-10-CM

## 2015-09-19 DIAGNOSIS — C220 Liver cell carcinoma: Secondary | ICD-10-CM

## 2015-09-19 DIAGNOSIS — R188 Other ascites: Secondary | ICD-10-CM

## 2015-09-19 DIAGNOSIS — K7460 Unspecified cirrhosis of liver: Secondary | ICD-10-CM

## 2015-09-19 LAB — GLUCOSE, CAPILLARY: Glucose-Capillary: 201 mg/dL — ABNORMAL HIGH (ref 65–99)

## 2015-09-19 LAB — COMPREHENSIVE METABOLIC PANEL
ALBUMIN: 2.6 g/dL — AB (ref 3.5–5.0)
ALT: 38 U/L (ref 17–63)
AST: 98 U/L — ABNORMAL HIGH (ref 15–41)
Alkaline Phosphatase: 155 U/L — ABNORMAL HIGH (ref 38–126)
Anion gap: 6 (ref 5–15)
BUN: 19 mg/dL (ref 6–20)
CHLORIDE: 99 mmol/L — AB (ref 101–111)
CO2: 27 mmol/L (ref 22–32)
CREATININE: 1.06 mg/dL (ref 0.61–1.24)
Calcium: 8.7 mg/dL — ABNORMAL LOW (ref 8.9–10.3)
GFR calc non Af Amer: >60 mL/min (ref >60)
GLUCOSE: 265 mg/dL — AB (ref 65–99)
Potassium: 4 mmol/L (ref 3.5–5.1)
SODIUM: 132 mmol/L — AB (ref 135–145)
Total Bilirubin: 8.8 mg/dL — ABNORMAL HIGH (ref 0.3–1.2)
Total Protein: 7.4 g/dL (ref 6.5–8.1)

## 2015-09-19 LAB — CBC
HCT: 21.4 % — ABNORMAL LOW (ref 40.0–52.0)
HEMOGLOBIN: 7.4 g/dL — AB (ref 13.0–18.0)
MCH: 34.6 pg — AB (ref 26.0–34.0)
MCHC: 34.5 g/dL (ref 32.0–36.0)
MCV: 100.5 fL — ABNORMAL HIGH (ref 80.0–100.0)
PLATELETS: 36 10*3/uL — AB (ref 150–440)
RBC: 2.13 MIL/uL — AB (ref 4.40–5.90)
RDW: 22.9 % — ABNORMAL HIGH (ref 11.5–14.5)
WBC: 3 10*3/uL — AB (ref 3.8–10.6)

## 2015-09-19 LAB — PROTIME-INR
INR: 1.65
PROTHROMBIN TIME: 19.7 s — AB (ref 11.4–15.0)

## 2015-09-19 LAB — APTT: aPTT: 41 seconds — ABNORMAL HIGH (ref 24–36)

## 2015-09-19 LAB — PREPARE RBC (CROSSMATCH)

## 2015-09-19 MED ORDER — VITAMIN D 1000 UNITS PO TABS
1000.0000 [IU] | ORAL_TABLET | Freq: Every day | ORAL | Status: DC
  Administered 2015-09-21 – 2015-09-22 (×2): 1000 [IU] via ORAL
  Filled 2015-09-19 (×4): qty 1

## 2015-09-19 MED ORDER — RIFAXIMIN 550 MG PO TABS
550.0000 mg | ORAL_TABLET | Freq: Two times a day (BID) | ORAL | Status: DC
Start: 2015-09-19 — End: 2015-09-22
  Administered 2015-09-19 – 2015-09-22 (×5): 550 mg via ORAL
  Filled 2015-09-19 (×7): qty 1

## 2015-09-19 MED ORDER — INSULIN ASPART 100 UNIT/ML ~~LOC~~ SOLN: 10.0000 [IU] | Freq: Three times a day (TID) | SUBCUTANEOUS | Status: DC

## 2015-09-19 MED ORDER — POLYVINYL ALCOHOL 1.4 % OP SOLN
1.0000 [drp] | Freq: Four times a day (QID) | OPHTHALMIC | Status: DC
  Administered 2015-09-19: 23:00:00 1 [drp] via OPHTHALMIC
  Administered 2015-09-20: 2 [drp] via OPHTHALMIC
  Administered 2015-09-20: 1 [drp] via OPHTHALMIC
  Administered 2015-09-20 – 2015-09-22 (×3): 2 [drp] via OPHTHALMIC
  Filled 2015-09-19 (×2): qty 15

## 2015-09-19 MED ORDER — ALLOPURINOL 100 MG PO TABS
100.0000 mg | ORAL_TABLET | Freq: Two times a day (BID) | ORAL | Status: DC
  Administered 2015-09-19 – 2015-09-22 (×5): 100 mg via ORAL
  Filled 2015-09-19 (×7): qty 1

## 2015-09-19 MED ORDER — CHOLESTYRAMINE 4 G PO PACK
4.0000 g | PACK | Freq: Three times a day (TID) | ORAL | Status: DC
  Administered 2015-09-19 – 2015-09-22 (×6): 4 g via ORAL
  Filled 2015-09-19 (×10): qty 1

## 2015-09-19 MED ORDER — DEXTROSE 5 % IV SOLN
1.0000 g | INTRAVENOUS | Status: DC
  Administered 2015-09-19 – 2015-09-20 (×2): 1 g via INTRAVENOUS
  Filled 2015-09-19 (×3): qty 10

## 2015-09-19 MED ORDER — SODIUM CHLORIDE 0.9 % IJ SOLN
3.0000 mL | INTRAMUSCULAR | Status: AC | PRN
  Administered 2015-09-21: 3 mL

## 2015-09-19 MED ORDER — INSULIN GLARGINE 100 UNIT/ML ~~LOC~~ SOLN
15.0000 [IU] | Freq: Every day | SUBCUTANEOUS | Status: DC
  Administered 2015-09-19 – 2015-09-21 (×3): 15 [IU] via SUBCUTANEOUS
  Filled 2015-09-19 (×4): qty 0.15

## 2015-09-19 MED ORDER — SERTRALINE HCL 50 MG PO TABS
50.0000 mg | ORAL_TABLET | Freq: Every day | ORAL | Status: DC
  Administered 2015-09-21: 10:00:00 50 mg via ORAL
  Filled 2015-09-19 (×2): qty 1

## 2015-09-19 MED ORDER — HYDROXYZINE HCL 25 MG PO TABS: 12.5000 mg | ORAL_TABLET | Freq: Three times a day (TID) | ORAL | Status: DC | PRN

## 2015-09-19 MED ORDER — LACTULOSE 10 GM/15ML PO SOLN
20.0000 g | Freq: Two times a day (BID) | ORAL | Status: DC
  Administered 2015-09-19 – 2015-09-22 (×4): 20 g via ORAL
  Filled 2015-09-19 (×5): qty 30

## 2015-09-19 MED ORDER — GUAIFENESIN-CODEINE 100-10 MG/5ML PO SOLN: 5.0000 mL | ORAL | Status: DC | PRN

## 2015-09-19 MED ORDER — FERROUS SULFATE 325 (65 FE) MG PO TABS
325.0000 mg | ORAL_TABLET | Freq: Three times a day (TID) | ORAL | Status: DC
  Administered 2015-09-20 – 2015-09-22 (×5): 325 mg via ORAL
  Filled 2015-09-19 (×6): qty 1

## 2015-09-19 MED ORDER — PANTOPRAZOLE SODIUM 40 MG PO TBEC
40.0000 mg | DELAYED_RELEASE_TABLET | Freq: Every day | ORAL | Status: DC
  Administered 2015-09-21 – 2015-09-22 (×2): 40 mg via ORAL
  Filled 2015-09-19 (×3): qty 1

## 2015-09-19 MED ORDER — SODIUM CHLORIDE 0.9 % IV SOLN
10.0000 mL/h | Freq: Once | INTRAVENOUS | Status: AC
  Administered 2015-09-19: 10 mL/h via INTRAVENOUS

## 2015-09-19 MED ORDER — HEPARIN SOD (PORK) LOCK FLUSH 100 UNIT/ML IV SOLN: 250.0000 [IU] | INTRAVENOUS | Status: DC | PRN

## 2015-09-19 MED ORDER — SODIUM CHLORIDE 0.9 % IJ SOLN: 10.0000 mL | INTRAMUSCULAR | Status: DC | PRN

## 2015-09-19 MED ORDER — HEPARIN SOD (PORK) LOCK FLUSH 100 UNIT/ML IV SOLN: 500.0000 [IU] | Freq: Every day | INTRAVENOUS | Status: DC | PRN

## 2015-09-19 MED ORDER — ONDANSETRON 4 MG PO TBDP
4.0000 mg | ORAL_TABLET | Freq: Three times a day (TID) | ORAL | Status: DC | PRN
  Administered 2015-09-19 – 2015-09-21 (×2): 4 mg via ORAL
  Filled 2015-09-19 (×3): qty 1

## 2015-09-19 MED ORDER — BENZONATATE 100 MG PO CAPS
100.0000 mg | ORAL_CAPSULE | Freq: Three times a day (TID) | ORAL | Status: DC
  Administered 2015-09-19 – 2015-09-22 (×6): 100 mg via ORAL
  Filled 2015-09-19 (×7): qty 1

## 2015-09-19 MED ORDER — METFORMIN HCL ER 500 MG PO TB24
1000.0000 mg | ORAL_TABLET | Freq: Every day | ORAL | Status: DC
  Filled 2015-09-19 (×2): qty 2

## 2015-09-19 NOTE — Progress Notes (Signed)
Visit made. Patient is followed at home by [facility] with a hospice diagnosis of Liver Ca. He has and out of facility DNR in place in his home.  Patient presented to the ED this morning for evaluation of low hemoglobin on direction of his GI physician. Of note he has had rectal bleeding for the past few days, was seen in the ED on 9/29 and returned home.  Patient seen lying in bed, eyes closed, appeared to be sleeping. Skin pale/jaundiced, abdomen distended. Plan is for patient to receive a unit of PRBC's for hemoglobin of 7. 4, per chart review his hemoglobin was  8.6 on 9/25.  No family present. Patient lives at home with his wife and his daughter Kenney Houseman helps with his care as well. Will continue to follow through discharge and update hospice team. Hospital staff aware that patient is followed by hospice at home. Flo Shanks RN, BSN, [facility], hospital Liaison [phone] c

## 2015-09-19 NOTE — H&P (Signed)
[facility] [facility] [place] NAME: [name]    MR#:  [id number]  DATE OF BIRTH:  [birth year]  DATE OF ADMISSION:  09/19/2015  PRIMARY CARE PHYSICIAN: Adrian Prows, MD   REQUESTING/REFERRING PHYSICIAN: Dr. Lavonia Drafts  CHIEF COMPLAINT:   Chief Complaint  Patient presents with  . Anemia    HISTORY OF PRESENT ILLNESS:  [name]  is a 69 y.o. male with a known history of nonalcoholic liver cirrhosis with ascites, hypertension, COPD, insulin-dependent diabetes mellitus, chronic anemia from GI bleed, recent admission for upper GI bleed with EGD showing portal gastropathy comes from home secondary to rectal bleed that started last night. Patient was here about 4 weeks ago for similar admission, had anemia at that time. Received transfusion hemoglobin was stable greater than 8 at the time of discharge. His hemoglobin 4 days ago as outpatient was 8.64 days ago. Came to the emergency room yesterday for painless rectal bleeding, hemoglobin was 7.8. As discussed with on-call GI and patient returned home with hospice services. Hospice has been just following the patient for the past week. However last night and this morning he had much more bloody stools and so presented to the ER. His hemoglobin is 7.4. He is being admitted for a transfusion.  PAST MEDICAL HISTORY:   Past Medical History  Diagnosis Date  . Stroke   . Hypertension   . COPD (chronic obstructive pulmonary disease)   . Diabetes mellitus without complication   . H/O hiatal hernia   . Depression   . Arthritis   . Hepatitis     NASH  . Cirrhosis of liver not due to alcohol   . Obstructive sleep apnea 11/20/2014  . Hyperlipidemia   . Hiatal hernia   . GI bleeds, multiple during admission   . Restless leg syndrome   . Cancer     liver    PAST SURGICAL HISTORY:   Past Surgical History  Procedure Laterality Date  . Nose surgery    . Appendectomy    . Cardiac  catheterization    . Eye surgery      leaglly blind  . Anterior cervical decompression/discectomy fusion 4 levels N/A 11/13/2014    Procedure:  Cervical three-four, Cervical four-five, Cervical five-six, Cervical six- seven anterior cervical decompression with fusion and interbody prosthesis plating and bonegraft;  Surgeon: Consuella Lose, MD;  Location: [facility];  Service: Neurosurgery;  Laterality: N/A;  . Esophagogastroduodenoscopy N/A 08/23/2015    Procedure: ESOPHAGOGASTRODUODENOSCOPY (EGD);  Surgeon: Manya Silvas, MD;  Location: [facility] ENDOSCOPY;  Service: Endoscopy;  Laterality: N/A;  . Esophagogastroduodenoscopy (egd) with propofol N/A 07/20/2015    Procedure: ESOPHAGOGASTRODUODENOSCOPY (EGD) WITH PROPOFOL;  Surgeon: Lucilla Lame, MD;  Location: [facility] ENDOSCOPY;  Service: Endoscopy;  Laterality: N/A;    SOCIAL HISTORY:   Social History  Substance Use Topics  . Smoking status: Never Smoker   . Smokeless tobacco: Never Used  . Alcohol Use: No    FAMILY HISTORY:   Family History  Problem Relation Age of Onset  . Kidney failure Sister     CKD  . Diabetes Mellitus II Sister     DRUG ALLERGIES:   Allergies  Allergen Reactions  . Actos [Pioglitazone] Anaphylaxis  . Aminophylline Other (See Comments)    Reaction: Unknown  . Amitriptyline Other (See Comments)    Reaction: Unknown   . Ampicillin Hives  . Hctz [Hydrochlorothiazide] Other (See Comments)    Reaction: Unknown   .  Naproxen Other (See Comments)    Reaction:  GI bleeding   . Penicillins Rash    REVIEW OF SYSTEMS:   Review of Systems  Constitutional: Positive for malaise/fatigue. Negative for fever, chills and weight loss.  HENT: Negative for congestion, ear discharge, ear pain, hearing loss, nosebleeds and tinnitus.   Eyes: Positive for blurred vision and double vision. Negative for photophobia.  Respiratory: Negative for cough, hemoptysis, shortness of breath and wheezing.   Cardiovascular: Positive  for leg swelling. Negative for chest pain, palpitations and orthopnea.  Gastrointestinal: Positive for abdominal pain and blood in stool. Negative for heartburn, nausea, vomiting, diarrhea and constipation.  Genitourinary: Negative for dysuria, urgency, frequency and hematuria.  Musculoskeletal: Positive for myalgias. Negative for back pain and neck pain.  Skin: Negative for rash.  Neurological: Negative for dizziness, tingling, tremors, sensory change, speech change, focal weakness and headaches.       Generalised weakness  Endo/Heme/Allergies: Does not bruise/bleed easily.  Psychiatric/Behavioral: Negative for depression.    MEDICATIONS AT HOME:   Prior to Admission medications   Medication Sig Start Date End Date Taking? Authorizing Provider  acetaminophen (TYLENOL) 325 MG tablet Take 325-650 mg by mouth every 4 (four) hours as needed for mild pain.    Yes Historical Provider, MD  allopurinol (ZYLOPRIM) 100 MG tablet Take 1 tablet (100 mg total) by mouth 2 (two) times daily. 11/30/14  Yes Daniel J Angiulli, PA-C  benzonatate (TESSALON) 100 MG capsule Take 1 capsule (100 mg total) by mouth 3 (three) times daily. Pt is hospice pt and they have given permission over the phone to cover med 09/18/15  Yes Leia Alf, MD  Carboxymethylcellul-Glycerin 0.5-0.9 % SOLN Apply 1-2 drops to eye 4 (four) times daily.   Yes Historical Provider, MD  Cholecalciferol (VITAMIN D-3) 1000 UNITS CAPS Take 1 capsule (1,000 Units total) by mouth daily. 11/30/14  Yes Daniel J Angiulli, PA-C  cholestyramine (QUESTRAN) 4 G packet Take 1 packet (4 g total) by mouth 3 (three) times daily. 11/30/14  Yes Daniel J Angiulli, PA-C  ferrous sulfate 325 (65 FE) MG tablet Take 1 tablet (325 mg total) by mouth 3 (three) times daily with meals. 11/30/14  Yes Daniel J Angiulli, PA-C  furosemide (LASIX) 40 MG tablet Take 1 tablet (40 mg total) by mouth daily. 11/30/14  Yes Daniel J Angiulli, PA-C  guaiFENesin-codeine  (CHERATUSSIN AC) 100-10 MG/5ML syrup Take 5 mLs by mouth every 4 (four) hours as needed for cough. 09/09/15  Yes Leia Alf, MD  hydrOXYzine (ATARAX/VISTARIL) 25 MG tablet 1/2 to 1 tablet tid as needed for itching 09/09/15  Yes Leia Alf, MD  insulin aspart (NOVOLOG) 100 UNIT/ML injection Inject 10 Units into the skin 3 (three) times daily with meals. 08/29/15  Yes Epifanio Lesches, MD  insulin glargine (LANTUS) 100 UNIT/ML injection Inject 15 Units into the skin at bedtime.   Yes Historical Provider, MD  lactulose (CHRONULAC) 10 GM/15ML solution Take 45 mLs (30 g total) by mouth 2 (two) times daily. Patient taking differently: Take 20 g by mouth 2 (two) times daily.  11/30/14  Yes Daniel J Angiulli, PA-C  metFORMIN (GLUMETZA) 500 MG (MOD) 24 hr tablet Take 1,000 mg by mouth daily with supper.   Yes Historical Provider, MD  omeprazole (PRILOSEC) 20 MG capsule Take 1 capsule (20 mg total) by mouth 2 (two) times daily before a meal. 11/30/14  Yes Daniel J Angiulli, PA-C  ondansetron (ZOFRAN ODT) 4 MG disintegrating tablet Take 1 tablet (4 mg total)  by mouth every 8 (eight) hours as needed for nausea or vomiting. 09/16/15  Yes Loney Hering, MD  rifaximin (XIFAXAN) 550 MG TABS tablet Take 1 tablet (550 mg total) by mouth 2 (two) times daily. 11/30/14  Yes Daniel J Angiulli, PA-C  sertraline (ZOLOFT) 50 MG tablet Take 50 mg by mouth daily.   Yes Historical Provider, MD      VITAL SIGNS:  Blood pressure 125/59, pulse 93, temperature 97.6 F (36.4 C), temperature source Oral, resp. rate 14, height 5\' 4"  (1.626 m), weight 78.472 kg (173 lb), SpO2 97 %.  PHYSICAL EXAMINATION:   Physical Exam  GENERAL:  69 y.o.-year-old elderly patient lying in the bed with no acute distress.  EYES: Pupils round, reactive to light and accommodation.right pupil 3 mm, left pupil 2 mm, significant scleral icterus. Extraocular muscles intact.  HEENT: Head atraumatic, normocephalic. Oropharynx and nasopharynx  clear.  NECK:  Supple, no jugular venous distention. No thyroid enlargement, no tenderness.  LUNGS: Normal breath sounds bilaterally, no wheezing, rales,rhonchi or crepitation. No use of accessory muscles of respiration. Decreased bibasilar breath sounds. CARDIOVASCULAR: S1, S2 normal. No rubs, or gallops. 3/6 systolic murmur present ABDOMEN: Soft, significantly distended, some discomfort in RUQ. Bowel sounds present. enlarged blood vessels on the abdomen EXTREMITIES: No cyanosis, or clubbing. 1-2+ pedal edema NEUROLOGIC: Cranial nerves II through XII are intact. Muscle strength 5/5 in all extremities. Sensation intact. Gait not checked. generalized weakness noted PSYCHIATRIC: The patient is alert and oriented x 3. Slow to respond SKIN: nonblanchable petechial rash on the legs   LABORATORY PANEL:   CBC  Recent Labs Lab 09/19/15 1023  WBC 3.0*  HGB 7.4*  HCT 21.4*  PLT 36*   ------------------------------------------------------------------------------------------------------------------  Chemistries   Recent Labs Lab 09/19/15 1023  NA 132*  K 4.0  CL 99*  CO2 27  GLUCOSE 265*  BUN 19  CREATININE 1.06  CALCIUM 8.7*  AST 98*  ALT 38  ALKPHOS 155*  BILITOT 8.8*   ------------------------------------------------------------------------------------------------------------------  Cardiac Enzymes No results for input(s): TROPONINI in the last 168 hours. ------------------------------------------------------------------------------------------------------------------  RADIOLOGY:  No results found.  EKG:   Orders placed or performed during the hospital encounter of 09/19/15  . ED EKG  . ED EKG    IMPRESSION AND PLAN:   [name]  is a 69 y.o. male with a known history of nonalcoholic liver cirrhosis with ascites, hypertension, COPD, insulin-dependent diabetes mellitus, chronic anemia from GI bleed, recent admission for upper GI bleed with EGD showing portal  gastropathy comes from home secondary to rectal bleed that started last night.  #1 GI bleed-right red blood per rectum. -Patient does have history of rectal bleed, however hemoglobin drop and persistent bleeding requiring admission at this time. -1 unit of packed RBC transfusion ordered. -EGD done 4 weeks ago showing posterior gastropathy and gastritis. -Continue PPI. -Started on clear liquid diet at this time.  #2 nonalcoholic liver cirrhosis with significant ascites.-Belly significantly distended. -We'll try to see if paracentesis can be done this admission.  -With GI bleed and ascites, will start on empiric Rocephin at this time. -Continue lactulose and rifaximine  #3 diabetes mellitus-insulin-dependent. -Continue his Lantus and also NovoLog prior to meals. We will add sliding scale insulin.  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 Have been stable.  #5 discussed this and patient just started being followed by hospice since last week. -Likely discharge home with hospice. -Family not available at this time. Will need CODE STATUS discussion. No prior documentation of  DO NOT RESUSCITATE status. For now will seem to be full code until discussed with family.   All the records are reviewed and case discussed with ED provider. Management plans discussed with the patient, family and they are in agreement.  CODE STATUS: FULL CODE  TOTAL TIME TAKING CARE OF THIS PATIENT:  50 minutes.    Gladstone Lighter M.D on 09/19/2015 at 2:14 PM  Between 7am to 6pm - Pager - [phone]  After 6pm go to [email or web address] - password [email or web address] [facility]  [place] [facility]  Office  [phone]  CC: Primary care physician; Adrian Prows, MD

## 2015-09-19 NOTE — Consult Note (Signed)
GI Inpatient Consult Note  Reason for Consult: Rectal bleeding   History of Present Illness: Luke PORTOCARRERO Sr. is a 69 y.o. male with significant history of nonalcoholic cirrhosis, ascites, diabetes type 2, chronic anemia.  Reports he had a large dark bowel movement yesterday, reported to the emergency department, was evaluated and sent home.  His wife called our office this morning and reported the ED visit and wanted the patient to be seen in our office today.  Patient was advised to return to the emergency department due to potential low hemoglobin.   patient's son and daughter-in-law were present in the room and helped with history.  Patient has not had another bowel movement since the 1 yesterday.  He denies any abdominal pain.    Patient reports he has stopped taking the lactulose and metformin in the past week due to stomach upset and diarrhea  He had an upper endoscopy completed on August 23, 2015. Dr. Vira Agar. Indications; melena, Suspected upper gastrointestinal bleeding. Impression; normal esophagus. Gastric antral vascular ectasia without bleeding. Treated with argon plasma coagulation. No specimens collected.  his hemoglobin was 7.4 on admission, MCV 100.5, platelets 36.  Patient was receiving a transfusion as I was in the room with him.   Past Medical History:  Past Medical History  Diagnosis Date  . Stroke   . Hypertension   . COPD (chronic obstructive pulmonary disease)   . Diabetes mellitus without complication   . H/O hiatal hernia   . Depression   . Arthritis   . Hepatitis     NASH  . Cirrhosis of liver not due to alcohol   . Obstructive sleep apnea 11/20/2014  . Hyperlipidemia   . Hiatal hernia   . GI bleeds, multiple during admission   . Restless leg syndrome   . Cancer     liver    Problem List: Patient Active Problem List   Diagnosis Date Noted  . GI bleed 09/19/2015  . Anemia with chronic illness 08/22/2015  . Pancytopenia 08/09/2015  .  SDH (subdural hematoma) 08/09/2015  . Gout 08/09/2015  . Hypertension   . Cirrhosis of liver not due to alcohol   . Hyperlipidemia   . Iron deficiency anemia   . Gastric polyp   . Upper GI bleed 07/19/2015  . UTI (lower urinary tract infection) 11/23/2014  . Thrombocytopenia 11/23/2014  . Anemia 11/23/2014  . NASH (nonalcoholic steatohepatitis) 11/21/2014  . Stenosis of cervical spine region 11/20/2014  . Type 2 diabetes mellitus, uncontrolled, with retinopathy 11/20/2014  . History of COPD 11/20/2014  . Diabetes mellitus with diabetic retinopathy   . Acute respiratory acidosis   . Encounter for intubation   . Encounter for orogastric (OG) tube placement   . Surgery, other elective   . Cervical spondylosis with myelopathy 11/13/2014    Past Surgical History: Past Surgical History  Procedure Laterality Date  . Nose surgery    . Appendectomy    . Cardiac catheterization    . Eye surgery      leaglly blind  . Anterior cervical decompression/discectomy fusion 4 levels N/A 11/13/2014    Procedure:  Cervical three-four, Cervical four-five, Cervical five-six, Cervical six- seven anterior cervical decompression with fusion and interbody prosthesis plating and bonegraft;  Surgeon: Consuella Lose, MD;  Location: [facility];  Service: Neurosurgery;  Laterality: N/A;  . Esophagogastroduodenoscopy N/A 08/23/2015    Procedure: ESOPHAGOGASTRODUODENOSCOPY (EGD);  Surgeon: Manya Silvas, MD;  Location: [facility] ENDOSCOPY;  Service: Endoscopy;  Laterality: N/A;  .  Esophagogastroduodenoscopy (egd) with propofol N/A 07/20/2015    Procedure: ESOPHAGOGASTRODUODENOSCOPY (EGD) WITH PROPOFOL;  Surgeon: Lucilla Lame, MD;  Location: [facility] ENDOSCOPY;  Service: Endoscopy;  Laterality: N/A;    Allergies: Allergies  Allergen Reactions  . Actos [Pioglitazone] Anaphylaxis  . Aminophylline Other (See Comments)    Reaction: Unknown  . Amitriptyline Other (See Comments)    Reaction: Unknown   . Ampicillin  Hives  . Hctz [Hydrochlorothiazide] Other (See Comments)    Reaction: Unknown   . Naproxen Other (See Comments)    Reaction:  GI bleeding   . Penicillins Rash    Home Medications: Prescriptions prior to admission  Medication Sig Dispense Refill Last Dose  . acetaminophen (TYLENOL) 325 MG tablet Take 325-650 mg by mouth every 4 (four) hours as needed for mild pain.    prn at prn  . allopurinol (ZYLOPRIM) 100 MG tablet Take 1 tablet (100 mg total) by mouth 2 (two) times daily. 60 tablet 1 09/19/2015 at Unknown time  . benzonatate (TESSALON) 100 MG capsule Take 1 capsule (100 mg total) by mouth 3 (three) times daily. Pt is hospice pt and they have given permission over the phone to cover med 45 capsule 2 09/19/2015 at Unknown time  . Carboxymethylcellul-Glycerin 0.5-0.9 % SOLN Apply 1-2 drops to eye 4 (four) times daily.   09/19/2015 at Unknown time  . Cholecalciferol (VITAMIN D-3) 1000 UNITS CAPS Take 1 capsule (1,000 Units total) by mouth daily. 30 capsule 1 09/19/2015 at Unknown time  . cholestyramine (QUESTRAN) 4 G packet Take 1 packet (4 g total) by mouth 3 (three) times daily. 60 each 12 09/19/2015 at Unknown time  . ferrous sulfate 325 (65 FE) MG tablet Take 1 tablet (325 mg total) by mouth 3 (three) times daily with meals. 90 tablet 3 09/19/2015 at Unknown time  . furosemide (LASIX) 40 MG tablet Take 1 tablet (40 mg total) by mouth daily. 30 tablet 1 09/19/2015 at Unknown time  . guaiFENesin-codeine (CHERATUSSIN AC) 100-10 MG/5ML syrup Take 5 mLs by mouth every 4 (four) hours as needed for cough. 280 mL 1 09/19/2015 at Unknown time  . hydrOXYzine (ATARAX/VISTARIL) 25 MG tablet 1/2 to 1 tablet tid as needed for itching 42 tablet 1 prn at prn  . insulin aspart (NOVOLOG) 100 UNIT/ML injection Inject 10 Units into the skin 3 (three) times daily with meals. 10 mL 11 09/19/2015 at Unknown time  . insulin glargine (LANTUS) 100 UNIT/ML injection Inject 15 Units into the skin at bedtime.   09/18/2015 at  Unknown time  . lactulose (CHRONULAC) 10 GM/15ML solution Take 45 mLs (30 g total) by mouth 2 (two) times daily. (Patient taking differently: Take 20 g by mouth 2 (two) times daily. ) 240 mL 0 09/19/2015 at Unknown time  . metFORMIN (GLUMETZA) 500 MG (MOD) 24 hr tablet Take 1,000 mg by mouth daily with supper.   09/18/2015 at Unknown time  . omeprazole (PRILOSEC) 20 MG capsule Take 1 capsule (20 mg total) by mouth 2 (two) times daily before a meal. 60 capsule 1 09/19/2015 at Unknown time  . ondansetron (ZOFRAN ODT) 4 MG disintegrating tablet Take 1 tablet (4 mg total) by mouth every 8 (eight) hours as needed for nausea or vomiting. 20 tablet 0 prn at prn  . rifaximin (XIFAXAN) 550 MG TABS tablet Take 1 tablet (550 mg total) by mouth 2 (two) times daily. 60 tablet 1 09/19/2015 at Unknown time  . sertraline (ZOLOFT) 50 MG tablet Take 50 mg by mouth daily.  09/19/2015 at Unknown time   Home medication reconciliation was completed with the patient.   Scheduled Inpatient Medications:   . cefTRIAXone (ROCEPHIN)  IV  1 g Intravenous Q24H    Continuous Inpatient Infusions:     PRN Inpatient Medications:    Family History: family history includes Diabetes Mellitus II in his sister; Kidney failure in his sister.     Social History:   reports that he has never smoked. He has never used smokeless tobacco. He reports that he does not drink alcohol or use illicit drugs.   Review of Systems: Eyes:   Positive for vision changes ENT: No oral lesions, sore throat.  GI: see HPI.  Heme/Lymph: No easy bruising.  CV: No chest pain.  GU: No hematuria.  Integumentary: No rashes.  Neuro: No headaches.  Psych: No depression/anxiety.  Endocrine: No heat/cold intolerance.  Allergic/Immunologic: No urticaria.  Resp:  Positive for cough  Musculoskeletal: No joint swelling.    Physical Examination: BP 121/54 mmHg  Pulse 88  Temp(Src) 98 F (36.7 C) (Oral)  Resp 20  Ht 5\' 4"  (1.626 m)  Wt 78.472 kg  (173 lb)  BMI 29.68 kg/m2  SpO2 97% Gen: NAD, alert and oriented x 4.   Patient's son and daughter-in-law were present during the examination and helped provide history. HEENT: PEERLA, EOMI, scleral icterus noted. Neck: supple, no JVD or thyromegaly Chest:  Wheezing noted in the right lower lobe CV: RRR, no m/g/c/r Abd: soft, NT, significantly distended, +BS in all four quadrants; no HSM, guarding, ridigity, or rebound tenderness Ext:  Slight edema edema, well perfused with 2+ pulses, Skin:  Jaundice noted Lymph: no LAD  Data: Lab Results  Component Value Date   WBC 3.0* 09/19/2015   HGB 7.4* 09/19/2015   HCT 21.4* 09/19/2015   MCV 100.5* 09/19/2015   PLT 36* 09/19/2015    Recent Labs Lab 09/18/15 1100 09/18/15 1824 09/19/15 1023  HGB 7.8* 7.4* 7.4*   Lab Results  Component Value Date   NA 132* 09/19/2015   K 4.0 09/19/2015   CL 99* 09/19/2015   CO2 27 09/19/2015   BUN 19 09/19/2015   CREATININE 1.06 09/19/2015   Lab Results  Component Value Date   ALT 38 09/19/2015   AST 98* 09/19/2015   ALKPHOS 155* 09/19/2015   BILITOT 8.8* 09/19/2015    Recent Labs Lab 09/19/15 1023  APTT 41*  INR 1.65   Assessment/Plan: Mr. Bonadonna is a 69 y.o. male  With nonalcoholic cirrhosis with ascites, liver mass as noted on ultrasound from September, 2016 presenting with rectal bleeding, hemoglobin of 7.4, MCV 100.5, platelets of 36.  Recommendations:  we agree with continuing the Xifaxin and  Lactulose.  We agree with transfusing as needed.  We do not believe patient needs to have an upper endoscopy again this quickly, also have concern with low platelets.  Patient does report a cough for the past couple of weeks and does have some wheezing in the right lower lobe, we recommend a chest x-ray for further evaluation.  We agree with paracentesis when able.  We will continue to follow with you.  Thank you for the consult. Please call with questions or concerns.  Salvadore Farber, PA-C  I personally performed these services.

## 2015-09-19 NOTE — Care Management Note (Signed)
Case Management Note  Patient Details  Name: BARI HANDSHOE Sr. MRN: [id number] Date of Birth: [birth year]  Subjective/Objective:   Per patient access, the pt. Is with [facility]. They wanted the agency to be notified they are here in the hospital. I have sent a text to Owain Eckerman which has been acknowledged.                 Action/Plan:   Expected Discharge Date:                  Expected Discharge Plan:     In-House Referral:     Discharge planning Services     Post Acute Care Choice:    Choice offered to:     DME Arranged:    DME Agency:     HH Arranged:    [place] Agency:     Status of Service:     Medicare Important Message Given:    Date Medicare IM Given:    Medicare IM give by:    Date Additional Medicare IM Given:    Additional Medicare Important Message give by:     If discussed at [place] of Stay Meetings, dates discussed:    Additional Comments:  Beau Fanny, RN 09/19/2015, 10:55 AM

## 2015-09-19 NOTE — ED Provider Notes (Signed)
[facility] Emergency Department Provider Note  ____________________________________________  Time seen: On arrival  I have reviewed the triage vital signs and the nursing notes.   HISTORY  Chief Complaint Anemia    HPI Luke SHEN Sr. is a 69 y.o. male who is referred in by gastric urology for rectal bleeding and low hemoglobin.Patient is a hospice patient secondary to liver failure does desire treatment. Patient's daughter reports bloody bowel movements started yesterday and continued through the night. Apparently he woke up with bright red blood around his bed. He denies abdominal pain. He does have a history of a GI bleed in the past. No fevers no chills. No dizziness.     Past Medical History  Diagnosis Date  . Stroke   . Hypertension   . COPD (chronic obstructive pulmonary disease)   . Diabetes mellitus without complication   . H/O hiatal hernia   . Depression   . Arthritis   . Hepatitis     NASH  . Cirrhosis of liver not due to alcohol   . Obstructive sleep apnea 11/20/2014  . Hyperlipidemia   . Hiatal hernia   . GI bleeds, multiple during admission   . Restless leg syndrome   . Cancer     liver    Patient Active Problem List   Diagnosis Date Noted  . Anemia with chronic illness 08/22/2015  . Pancytopenia 08/09/2015  . SDH (subdural hematoma) 08/09/2015  . Gout 08/09/2015  . Hypertension   . Cirrhosis of liver not due to alcohol   . Hyperlipidemia   . Iron deficiency anemia   . Gastric polyp   . Upper GI bleed 07/19/2015  . UTI (lower urinary tract infection) 11/23/2014  . Thrombocytopenia 11/23/2014  . Anemia 11/23/2014  . NASH (nonalcoholic steatohepatitis) 11/21/2014  . Stenosis of cervical spine region 11/20/2014  . Type 2 diabetes mellitus, uncontrolled, with retinopathy 11/20/2014  . History of COPD 11/20/2014  . Diabetes mellitus with diabetic retinopathy   . Acute respiratory acidosis   . Encounter for  intubation   . Encounter for orogastric (OG) tube placement   . Surgery, other elective   . Cervical spondylosis with myelopathy 11/13/2014    Past Surgical History  Procedure Laterality Date  . Nose surgery    . Appendectomy    . Cardiac catheterization    . Eye surgery      leaglly blind  . Anterior cervical decompression/discectomy fusion 4 levels N/A 11/13/2014    Procedure:  Cervical three-four, Cervical four-five, Cervical five-six, Cervical six- seven anterior cervical decompression with fusion and interbody prosthesis plating and bonegraft;  Surgeon: Consuella Lose, MD;  Location: [facility]UR[facility] OR[facility];  Service: Neurosurgery;  Laterality: N/A;  . Esophagogastroduodenoscopy N/A 08/23/2015    Procedure: ESOPHAGOGASTRODUODENOSCOPY (EGD);  Surgeon: Manya Silvas, MD;  Location: [facility] ENDOSCOPY;  Service: Endoscopy;  Laterality: N/A;  . Esophagogastroduodenoscopy (egd) with propofol N/A 07/20/2015    Procedure: ESOPHAGOGASTRODUODENOSCOPY (EGD) WITH PROPOFOL;  Surgeon: Lucilla Lame, MD;  Location: [facility] ENDOSCOPY;  Service: Endoscopy;  Laterality: N/A;    Current Outpatient Rx  Name  Route  Sig  Dispense  Refill  . acetaminophen (TYLENOL) 325 MG tablet   Oral   Take 325-650 mg by mouth every 4 (four) hours as needed for mild pain.          Marland Kitchen allopurinol (ZYLOPRIM) 100 MG tablet   Oral   Take 1 tablet (100 mg total) by mouth 2 (two) times daily.   [place]  tablet   1   . benzonatate (TESSALON) 100 MG capsule   Oral   Take 1 capsule (100 mg total) by mouth 3 (three) times daily. Pt is hospice pt and they have given permission over the phone to cover med   45 capsule   2   . Carboxymethylcellul-Glycerin 0.5-0.9 % SOLN   Ophthalmic   Apply 1-2 drops to eye 4 (four) times daily.         . Cholecalciferol (VITAMIN D-3) 1000 UNITS CAPS   Oral   Take 1 capsule (1,000 Units total) by mouth daily.   30 capsule   1   . cholestyramine (QUESTRAN) 4 G packet   Oral   Take 1 packet (4 g  total) by mouth 3 (three) times daily.   60 each   12   . ferrous sulfate 325 (65 FE) MG tablet   Oral   Take 1 tablet (325 mg total) by mouth 3 (three) times daily with meals.   90 tablet   3   . furosemide (LASIX) 40 MG tablet   Oral   Take 1 tablet (40 mg total) by mouth daily.   30 tablet   1   . guaiFENesin-codeine (CHERATUSSIN AC) 100-10 MG/5ML syrup   Oral   Take 5 mLs by mouth every 4 (four) hours as needed for cough.   280 mL   1   . hydrOXYzine (ATARAX/VISTARIL) 25 MG tablet      1/2 to 1 tablet tid as needed for itching   42 tablet   1   . insulin aspart (NOVOLOG) 100 UNIT/ML injection   Subcutaneous   Inject 10 Units into the skin 3 (three) times daily with meals.   10 mL   11   . insulin glargine (LANTUS) 100 UNIT/ML injection   Subcutaneous   Inject 15 Units into the skin at bedtime.         Marland Kitchen lactulose (CHRONULAC) 10 GM/15ML solution   Oral   Take 45 mLs (30 g total) by mouth 2 (two) times daily. Patient taking differently: Take 20 g by mouth 2 (two) times daily.    240 mL   0   . metFORMIN (GLUMETZA) 500 MG (MOD) 24 hr tablet   Oral   Take 1,000 mg by mouth daily with supper.         Marland Kitchen omeprazole (PRILOSEC) 20 MG capsule   Oral   Take 1 capsule (20 mg total) by mouth 2 (two) times daily before a meal.   60 capsule   1   . ondansetron (ZOFRAN ODT) 4 MG disintegrating tablet   Oral   Take 1 tablet (4 mg total) by mouth every 8 (eight) hours as needed for nausea or vomiting.   20 tablet   0   . rifaximin (XIFAXAN) 550 MG TABS tablet   Oral   Take 1 tablet (550 mg total) by mouth 2 (two) times daily.   60 tablet   1   . sertraline (ZOLOFT) 50 MG tablet   Oral   Take 50 mg by mouth daily.           Allergies Actos; Aminophylline; Amitriptyline; Ampicillin; Hctz; Naproxen; and Penicillins  Family History  Problem Relation Age of Onset  . Kidney failure Sister     CKD  . Diabetes Mellitus II Sister     Social  History Social History  Substance Use Topics  . Smoking status: Never Smoker   . Smokeless tobacco:  Never Used  . Alcohol Use: No    Review of Systems  Constitutional: Negative for fever. Eyes: Negative for visual changes. ENT: Negative for sore throat Cardiovascular: Negative for chest pain. Respiratory: Negative for shortness of breath. Gastrointestinal: Negative for abdominal pain, vomiting and diarrhea. Positive for rectal bleeding Genitourinary: Negative for dysuria. Musculoskeletal: Negative for back pain. Skin: Negative for rash. Neurological: Negative for headaches or focal weakness Psychiatric: No anxiety    ____________________________________________   PHYSICAL EXAM:  VITAL SIGNS: ED Triage Vitals  Enc Vitals Group     BP 09/19/15 1030 122/49 mmHg     Pulse Rate 09/19/15 1030 94     Resp 09/19/15 1030 16     Temp 09/19/15 1030 97.6 F (36.4 C)     Temp Source 09/19/15 1030 Oral     SpO2 09/19/15 1030 97 %     Weight 09/19/15 1030 173 lb (78.472 kg)     Height 09/19/15 1030 5\' 4"  (1.626 m)     Head Cir --      Peak Flow --      Pain Score 09/19/15 1118 0     Pain Loc --      Pain Edu? --      Excl. in [place]? --      Constitutional: Alert and oriented. Jaundiced Eyes: Conjunctivae are icteric ENT   Head: Normocephalic and atraumatic.   Mouth/Throat: Mucous membranes are moist. Cardiovascular: Normal rate, regular rhythm. Normal and symmetric distal pulses are present in all extremities. No murmurs, rubs, or gallops. Respiratory: Normal respiratory effort without tachypnea nor retractions. Breath sounds are clear and equal bilaterally.  Gastrointestinal: Soft and non-tender in all quadrants. No distention. There is no CVA tenderness. Bright red blood on rectal exam and in diaper Genitourinary: deferred Musculoskeletal: Nontender with normal range of motion in all extremities. No lower extremity tenderness nor edema. Neurologic:  Normal speech and  language. No gross focal neurologic deficits are appreciated. Skin:  Skin is warm, dry and intact. No rash noted. Psychiatric: Mood and affect are normal. Patient exhibits appropriate insight and judgment.  ____________________________________________    LABS (pertinent positives/negatives)  Labs Reviewed  CBC - Abnormal; Notable for the following:    WBC 3.0 (*)    RBC 2.13 (*)    Hemoglobin 7.4 (*)    HCT 21.4 (*)    MCV 100.5 (*)    MCH 34.6 (*)    RDW 22.9 (*)    Platelets 36 (*)    All other components within normal limits  COMPREHENSIVE METABOLIC PANEL - Abnormal; Notable for the following:    Sodium 132 (*)    Chloride 99 (*)    Glucose, Bld 265 (*)    Calcium 8.7 (*)    Albumin 2.6 (*)    AST 98 (*)    Alkaline Phosphatase 155 (*)    Total Bilirubin 8.8 (*)    All other components within normal limits  APTT - Abnormal; Notable for the following:    aPTT 41 (*)    All other components within normal limits  PROTIME-INR - Abnormal; Notable for the following:    Prothrombin Time 19.7 (*)    All other components within normal limits  TYPE AND SCREEN    ____________________________________________   EKG  ED ECG REPORT I, Lavonia Drafts, the attending physician, personally viewed and interpreted this ECG.  Date: 09/19/2015 EKG Time: 12 50 p.m. Rate: 89 Rhythm: normal sinus rhythm QRS Axis: normal Intervals: Prolonged QT  ST/T Wave abnormalities: normal Conduction Disutrbances: none Narrative Interpretation: unremarkable   ____________________________________________    RADIOLOGY I have personally reviewed any xrays that were ordered on this patient: None  ____________________________________________   PROCEDURES  Procedure(s) performed: none  Critical Care performed: yes  CRITICAL CARE Performed by: Lavonia Drafts   Total critical care time: 30 min  Critical care time was exclusive of separately billable procedures and treating other  patients.  Critical care was necessary to treat or prevent imminent or life-threatening deterioration.  Critical care was time spent personally by me on the following activities: development of treatment plan with patient and/or surrogate as well as nursing, discussions with consultants, evaluation of patient's response to treatment, examination of patient, obtaining history from patient or surrogate, ordering and performing treatments and interventions, ordering and review of laboratory studies, ordering and review of radiographic studies, pulse oximetry and re-evaluation of patient's condition.  ____________________________________________   INITIAL IMPRESSION / ASSESSMENT AND PLAN / ED COURSE  Pertinent labs & imaging results that were available during my care of the patient were reviewed by me and considered in my medical decision making (see chart for details).  Patient chronically ill with liver failure. His INR is 1.6. Present with rectal bleeding with a drop in his hemoglobin was partially 1 g over the last couple of days. He does have guaiac-positive blood in his diaper and in his rectum. I will order 1 unit of packed red blood cells transfusion and admit the patient to the hospitalist service ____________________________________________   FINAL CLINICAL IMPRESSION(S) / ED DIAGNOSES  Final diagnoses:  Gastrointestinal hemorrhage, unspecified gastritis, unspecified gastrointestinal hemorrhage type     Lavonia Drafts, MD 09/19/15 1322

## 2015-09-19 NOTE — Plan of Care (Signed)
Problem: Phase I Progression Outcomes Goal: Pain controlled with appropriate interventions Outcome: Progressing Individulaization's: Pt is a 69 year old male, lives at home with daughter. Current hospice pt  Pt admitted 09/19/15 for transfusion, hbg was 7.5. consent has been signed Extensive history and is a current hospice pt, Pt will receive 1 unit of blood inpatient re-evaluate in morning and assess Hospice notification of admission Possible paracentesis today

## 2015-09-19 NOTE — ED Notes (Signed)
Pt sent over per DR Vira Agar for low HGB and blood in stools.

## 2015-09-20 ENCOUNTER — Inpatient Hospital Stay

## 2015-09-20 LAB — GLUCOSE, CAPILLARY
Glucose-Capillary: 136 mg/dL — ABNORMAL HIGH (ref 65–99)
Glucose-Capillary: 137 mg/dL — ABNORMAL HIGH (ref 65–99)
Glucose-Capillary: 113 mg/dL — ABNORMAL HIGH (ref 65–99)
Glucose-Capillary: 209 mg/dL — ABNORMAL HIGH (ref 65–99)

## 2015-09-20 LAB — CBC
HEMATOCRIT: 22.3 % — AB (ref 40.0–52.0)
Hemoglobin: 7.5 g/dL — ABNORMAL LOW (ref 13.0–18.0)
MCH: 33.4 pg (ref 26.0–34.0)
MCHC: 33.7 g/dL (ref 32.0–36.0)
MCV: 99.2 fL (ref 80.0–100.0)
Platelets: 37 10*3/uL — ABNORMAL LOW (ref 150–440)
RBC: 2.25 MIL/uL — ABNORMAL LOW (ref 4.40–5.90)
RDW: 21.6 % — AB (ref 11.5–14.5)
WBC: 3.2 10*3/uL — ABNORMAL LOW (ref 3.8–10.6)

## 2015-09-20 LAB — BODY FLUID CELL COUNT WITH DIFFERENTIAL
EOS FL: 0 %
Lymphs, Fluid: 47 %
Monocyte-Macrophage-Serous Fluid: 52 %
NEUTROPHIL FLUID: 1 %
Other Cells, Fluid: 0 %
WBC FLUID: 234 uL

## 2015-09-20 LAB — PROTEIN, BODY FLUID: Total protein, fluid: <3 g/dL

## 2015-09-20 LAB — GLUCOSE, PERITONEAL FLUID: Glucose, Peritoneal Fluid: 138 mg/dL

## 2015-09-20 LAB — PREPARE RBC (CROSSMATCH)

## 2015-09-20 MED ORDER — SODIUM CHLORIDE 0.9 % IV SOLN
Freq: Once | INTRAVENOUS | Status: AC
  Administered 2015-09-20: 21:00:00 via INTRAVENOUS

## 2015-09-20 MED ORDER — INSULIN ASPART 100 UNIT/ML ~~LOC~~ SOLN
0.0000 [IU] | Freq: Every day | SUBCUTANEOUS | Status: DC
  Administered 2015-09-21: 3 [IU] via SUBCUTANEOUS
  Filled 2015-09-20: qty 3

## 2015-09-20 MED ORDER — INSULIN ASPART 100 UNIT/ML ~~LOC~~ SOLN
0.0000 [IU] | Freq: Three times a day (TID) | SUBCUTANEOUS | Status: DC
  Administered 2015-09-20: 12:00:00 1 [IU] via SUBCUTANEOUS
  Administered 2015-09-21: 7 [IU] via SUBCUTANEOUS
  Administered 2015-09-21: 12:00:00 5 [IU] via SUBCUTANEOUS
  Administered 2015-09-21 – 2015-09-22 (×2): 2 [IU] via SUBCUTANEOUS
  Filled 2015-09-20: qty 5
  Filled 2015-09-20: qty 1
  Filled 2015-09-20 (×2): qty 2
  Filled 2015-09-20: qty 7

## 2015-09-20 MED ORDER — SODIUM CHLORIDE 0.9 % IV SOLN
Freq: Once | INTRAVENOUS | Status: AC
  Administered 2015-09-20: 13:00:00 via INTRAVENOUS

## 2015-09-20 NOTE — Plan of Care (Signed)
Problem: Phase I Progression Outcomes Goal: Pain controlled with appropriate interventions Outcome: Progressing Individualization of Care Pt prefers to be called [name] Hx of liver cancer, nonalcoholic liver cirrhosis, ascites, HTN, COPD, insulin-dependent diabetes mellitus, chronic anemia from GI bleeds, stroke, depression, arthritis, hepatitis.       Problem: Discharge Progression Outcomes Goal: Other Discharge Outcomes/Goals Pain:  Patient without complaints of pain this shift. Hemodynamically.  Patient afebrile and VSS this shift.  Received 1 unit of PRBC which completed at 19:49 this shift.  No s/s of reaction, patient tolerated well.  BP 125/58 mmHg  Pulse 90  Temp(Src) 98.2 F (36.8 C) (Oral)  Resp 20  Ht 5\' 4"  (1.626 m)  Wt 173 lb (78.472 kg)  BMI 29.68 kg/m2  SpO2 96% Diet:  Patient able to swallow pills whole with water. Activity:  Patient unsteady on feet.  Belly very distended and he is unable to see the floor.  He was up to bedside commode with two person assist.  Generalized weakness.  Bed alarm on throughout shift for safety.

## 2015-09-20 NOTE — Progress Notes (Signed)
Patient seen by GI today.  Recommend paracentesis when patient is able.  No family present.  Confirmed with nursing staff and care manager that patient is DNR at hospice with portable DNR.  Unable to contact family to confirm DNR.  Care management  And team will follow up.  Updated records faxed to triage.

## 2015-09-20 NOTE — Progress Notes (Signed)
I spoke with the patient's daughter Kenney Houseman who confirmed patient has a portable DNR and patient's wishes is for DNR.  Notified patient's RN Junie Panning who also spoke to [place].  Junie Panning will notify Dr. Bridgett Larsson. Patient's wife is deaf and on oxygen and will not be able to come to the hospital and is unable to communicate via phone.  Report to [facility] and Casimer Lanius SW.

## 2015-09-20 NOTE — Consult Note (Signed)
Pt not in room, went for paracentesis.  He has cirrhosis, small hepatocellular neoplasm. AVM of stomach, jaundice with hgb of 7.5.  He has hospice seeing him but was admitted for bleeding, had dark heme positive stool on my exam. No active bleeding, Plt ct was too low and he got a transfusion of platelets before his paracentesis.  Possible consider repeat EGD and possible cauterizations  Of AVM or observation.  Dr. Allen Norris on call this weekend.

## 2015-09-20 NOTE — Progress Notes (Addendum)
Plan of care progress to goal: HGB 7.5, plts: 37,000. Orders to transfuse pRBCs and platelets are in. Pt received 2 units of platelets. Pt had paracentesis this afternoon 4.8L fluid pulled off peritoneal cavity. No signs of active bleeding today. IV ABX infusing. Pt to get 1 unit pRBCs tonight.

## 2015-09-20 NOTE — Progress Notes (Signed)
[place] [facility] [place] NAME: [name]    MR#:  [id number]  DATE OF BIRTH:  March 16, 1946  SUBJECTIVE:  CHIEF COMPLAINT:   Chief Complaint  Patient presents with  . Anemia    No rectal bleeding after examination but still abdominal distention. REVIEW OF SYSTEMS:  CONSTITUTIONAL: No fever, generalized weakness.  EYES: No blurred or double vision. Jaundice. EARS, NOSE, AND THROAT: No tinnitus or ear pain.  RESPIRATORY: No cough, shortness of breath, wheezing or hemoptysis.  CARDIOVASCULAR: No chest pain, orthopnea, edema.  GASTROINTESTINAL: No nausea, vomiting, diarrhea but has abdominal pain and distention.  GENITOURINARY: No dysuria, hematuria.  ENDOCRINE: No polyuria, nocturia,  HEMATOLOGY: No anemia, easy bruising or bleeding SKIN: No rash or lesion. MUSCULOSKELETAL: No joint pain or arthritis.   NEUROLOGIC: No tingling, numbness, weakness.  PSYCHIATRY: No anxiety or depression.   DRUG ALLERGIES:   Allergies  Allergen Reactions  . Actos [Pioglitazone] Anaphylaxis  . Aminophylline Other (See Comments)    Reaction: Unknown  . Amitriptyline Other (See Comments)    Reaction: Unknown   . Ampicillin Hives  . Hctz [Hydrochlorothiazide] Other (See Comments)    Reaction: Unknown   . Naproxen Other (See Comments)    Reaction:  GI bleeding   . Penicillins Rash    VITALS:  Blood pressure 130/51, pulse 96, temperature 99.5 F (37.5 C), temperature source Oral, resp. rate 18, height 5\' 4"  (1.626 m), weight 78.472 kg (173 lb), SpO2 93 %.  PHYSICAL EXAMINATION:  GENERAL:  69 y.o.-year-old patient lying in the bed with no acute distress.  EYES: Pupils equal, round, reactive to light and accommodation. Has scleral icterus. Pale conjunctiva. Extraocular muscles intact.  HEENT: Head atraumatic, normocephalic. Oropharynx and nasopharynx clear.  NECK:  Supple, no jugular venous distention. No thyroid enlargement, no tenderness.   LUNGS: Normal breath sounds bilaterally, no wheezing, rales,rhonchi or crepitation. No use of accessory muscles of respiration.  CARDIOVASCULAR: S1, S2 normal. No murmurs, rubs, or gallops.  ABDOMEN: Soft, mild tenderness, highly distended. Bowel sounds hypoactive. Unable to estimate organomegaly or mass.  EXTREMITIES: No pedal edema, cyanosis, or clubbing.  NEUROLOGIC: Cranial nerves II through XII are intact. Muscle strength 4/5 in all extremities. Sensation intact. Gait not checked.  PSYCHIATRIC: The patient is alert and oriented x 3.  SKIN: No obvious rash, lesion, or ulcer. But has jaundice.   LABORATORY PANEL:   CBC  Recent Labs Lab 09/20/15 0838  WBC 3.2*  HGB 7.5*  HCT 22.3*  PLT 37*   ------------------------------------------------------------------------------------------------------------------  Chemistries   Recent Labs Lab 09/19/15 1023  NA 132*  K 4.0  CL 99*  CO2 27  GLUCOSE 265*  BUN 19  CREATININE 1.06  CALCIUM 8.7*  AST 98*  ALT 38  ALKPHOS 155*  BILITOT 8.8*   ------------------------------------------------------------------------------------------------------------------  Cardiac Enzymes No results for input(s): TROPONINI in the last 168 hours. ------------------------------------------------------------------------------------------------------------------  RADIOLOGY:  No results found.  EKG:   Orders placed or performed during the hospital encounter of 09/19/15  . ED EKG  . ED EKG    ASSESSMENT AND PLAN:   #1 GI bleed. -Patient does have history of rectal bleed, however hemoglobin drop and persistent bleeding requiring admission at this time.  * Anemia of acute blood loss and chronic disease. Status postop 1 unit of packed RBC transfusion. The hemoglobin is still 7.5 today. I will get another unit of PRBC transfusion. Follow-up CBC. EGD done 4 weeks ago showing posterior gastropathy and  gastritis. Continue PPI. Dr. Tiffany Kocher did  rectal exam which didn't show any active bleeding. He suggested no endoscopy today.  #2 nonalcoholic liver cirrhosis with significant ascites.  Since the patient has thrombocytopenia with low platelet, he needs platelet transfusion then get paracentesis today.  -With GI bleed and ascites, on empiric Rocephin.. -Continue lactulose and rifaximine  #3 diabetes mellitus-insulin-dependent. -Continue Lantus and sliding scale insulin. Hold metformin.  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  Thrombocytopenia. 2 units platelet transfusion today and a follow-up CBC.    I discussed with Dr.Elliot.  All the records are reviewed and case discussed with Care Management/Social Workerr. Management plans discussed with the patient, family and they are in agreement.  CODE STATUS: DO NOT RESUSCITATE.  TOTAL TIME TAKING CARE OF THIS PATIENT: 43 minutes.   POSSIBLE D/C IN 2-3 DAYS, DEPENDING ON CLINICAL CONDITION.   Demetrios Loll M.D on 09/20/2015 at 2:13 PM  Between 7am to 6pm - Pager - [phone]  After 6pm go to [email or web address] - password [email or web address]  [facility]  Office  [phone]  CC: Primary care physician; Adrian Prows, MD

## 2015-09-20 NOTE — Care Management (Signed)
Admitted to this facility with the diagnosis of GI bleed. Discharged from this facility 08/30/15. Lives with wife, Vaughan Basta [phone] or [phone]) and daughter Kenney Houseman [phone]). Sees Dr. Ola Spurr as primary care physician. Followed by [facility] in the past, but has been followed   by [facility] since last discharge from this facility. Hospital bed, rolling walker, cane, wheelchair, and home oxygen in the home. Falls in the past. Fair appetite.  Hgb 7.4 on admission. NPO Scheduled for a paracentesis today. Shelbie Ammons RN MSN Care Management [phone]

## 2015-09-21 DIAGNOSIS — K7460 Unspecified cirrhosis of liver: Secondary | ICD-10-CM

## 2015-09-21 DIAGNOSIS — D649 Anemia, unspecified: Secondary | ICD-10-CM

## 2015-09-21 DIAGNOSIS — R17 Unspecified jaundice: Secondary | ICD-10-CM

## 2015-09-21 DIAGNOSIS — D500 Iron deficiency anemia secondary to blood loss (chronic): Secondary | ICD-10-CM

## 2015-09-21 DIAGNOSIS — R188 Other ascites: Secondary | ICD-10-CM

## 2015-09-21 LAB — MAGNESIUM: Magnesium: 1.6 mg/dL — ABNORMAL LOW (ref 1.7–2.4)

## 2015-09-21 LAB — GLUCOSE, CAPILLARY
GLUCOSE-CAPILLARY: 305 mg/dL — AB (ref 65–99)
Glucose-Capillary: 155 mg/dL — ABNORMAL HIGH (ref 65–99)
Glucose-Capillary: 289 mg/dL — ABNORMAL HIGH (ref 65–99)
Glucose-Capillary: 282 mg/dL — ABNORMAL HIGH (ref 65–99)

## 2015-09-21 LAB — CBC WITH DIFFERENTIAL/PLATELET
Basophils Absolute: 0 10*3/uL (ref 0–0.1)
Basophils Relative: 1 %
Eosinophils Absolute: 0.1 10*3/uL (ref 0–0.7)
Eosinophils Relative: 4 %
HEMATOCRIT: 24.5 % — AB (ref 40.0–52.0)
HEMOGLOBIN: 8.5 g/dL — AB (ref 13.0–18.0)
LYMPHS ABS: 0.4 10*3/uL — AB (ref 1.0–3.6)
MCH: 33.8 pg (ref 26.0–34.0)
MCHC: 34.6 g/dL (ref 32.0–36.0)
MCV: 97.8 fL (ref 80.0–100.0)
MONO ABS: 0.2 10*3/uL (ref 0.2–1.0)
NEUTROS ABS: 2.3 10*3/uL (ref 1.4–6.5)
Neutrophils Relative %: 73 %
Platelets: 47 10*3/uL — ABNORMAL LOW (ref 150–440)
RBC: 2.51 MIL/uL — ABNORMAL LOW (ref 4.40–5.90)
RDW: 23.5 % — AB (ref 11.5–14.5)
WBC: 3.2 10*3/uL — ABNORMAL LOW (ref 3.8–10.6)

## 2015-09-21 LAB — TYPE AND SCREEN
ABO/RH(D): AB POS
Antibody Screen: NEGATIVE
UNIT DIVISION: 0
Unit division: 0

## 2015-09-21 LAB — BASIC METABOLIC PANEL
Anion gap: 6 (ref 5–15)
BUN: 18 mg/dL (ref 6–20)
CHLORIDE: 105 mmol/L (ref 101–111)
CO2: 29 mmol/L (ref 22–32)
CREATININE: 0.78 mg/dL (ref 0.61–1.24)
Calcium: 8.7 mg/dL — ABNORMAL LOW (ref 8.9–10.3)
GFR calc Af Amer: >60 mL/min (ref >60)
GFR calc non Af Amer: >60 mL/min (ref >60)
GLUCOSE: 160 mg/dL — AB (ref 65–99)
Potassium: 3.9 mmol/L (ref 3.5–5.1)
SODIUM: 140 mmol/L (ref 135–145)

## 2015-09-21 LAB — PREPARE PLATELET PHERESIS
UNIT DIVISION: 0
UNIT DIVISION: 0

## 2015-09-21 MED ORDER — DEXTROSE 5 % IV SOLN
1.0000 g | INTRAVENOUS | Status: DC
  Filled 2015-09-21: qty 10

## 2015-09-21 MED ORDER — SPIRONOLACTONE 25 MG PO TABS
25.0000 mg | ORAL_TABLET | Freq: Two times a day (BID) | ORAL | Status: DC
  Administered 2015-09-21 – 2015-09-22 (×2): 25 mg via ORAL
  Filled 2015-09-21 (×2): qty 1

## 2015-09-21 NOTE — Progress Notes (Signed)
[facility] [place] NAME: [name]    MR#:  [id number]  DATE OF BIRTH:  30-Jun-1946  SUBJECTIVE:  CHIEF COMPLAINT:   Chief Complaint  Patient presents with  . Anemia    No rectal bleeding after examination but still abdominal distention. Denies bloody bowel movement was on 09/20/2015 at around 3 AM, still somewhat loose bowel movements over the past 24 hours, but no blood. Perineal fluid nucleated blood cell count is below 250, unlikely SBP. Patient feels better today after paracentesis. Less short of breath REVIEW OF SYSTEMS:  CONSTITUTIONAL: No fever, generalized weakness.  EYES: No blurred or double vision. Jaundice. EARS, NOSE, AND THROAT: No tinnitus or ear pain.  RESPIRATORY: No cough, shortness of breath, wheezing or hemoptysis.  CARDIOVASCULAR: No chest pain, orthopnea, edema.  GASTROINTESTINAL: No nausea, vomiting, diarrhea but has abdominal pain and distention.  GENITOURINARY: No dysuria, hematuria.  ENDOCRINE: No polyuria, nocturia,  HEMATOLOGY: No anemia, easy bruising or bleeding SKIN: No rash or lesion. MUSCULOSKELETAL: No joint pain or arthritis.   NEUROLOGIC: No tingling, numbness, weakness.  PSYCHIATRY: No anxiety or depression.   DRUG ALLERGIES:   Allergies  Allergen Reactions  . Actos [Pioglitazone] Anaphylaxis  . Aminophylline Other (See Comments)    Reaction: Unknown  . Amitriptyline Other (See Comments)    Reaction: Unknown   . Ampicillin Hives  . Hctz [Hydrochlorothiazide] Other (See Comments)    Reaction: Unknown   . Naproxen Other (See Comments)    Reaction:  GI bleeding   . Penicillins Rash    VITALS:  Blood pressure 101/40, pulse 91, temperature 97.7 F (36.5 C), temperature source Oral, resp. rate 18, height 5\' 4"  (1.626 m), weight 78.472 kg (173 lb), SpO2 100 %.  PHYSICAL EXAMINATION:  GENERAL:  69 y.o.-year-old patient lying in the bed with no acute distress.  EYES: Pupils equal,  round, reactive to light and accommodation. Has scleral icterus. Pale conjunctiva. Extraocular muscles intact.  HEENT: Head atraumatic, normocephalic. Oropharynx and nasopharynx clear.  NECK:  Supple, no jugular venous distention. No thyroid enlargement, no tenderness.  LUNGS: Normal breath sounds bilaterally, no wheezing, rales,rhonchi or crepitation. No use of accessory muscles of respiration.  CARDIOVASCULAR: S1, S2 normal. No murmurs, rubs, or gallops.  ABDOMEN: Soft, no tenderness, distended, but soft and palpable. Bowel sounds hypoactive. Unable to estimate organomegaly or mass.  EXTREMITIES: No pedal edema, cyanosis, or clubbing.  NEUROLOGIC: Cranial nerves II through XII are intact. Muscle strength 4/5 in all extremities. Sensation intact. Gait not checked.  PSYCHIATRIC: The patient is alert and oriented x 3.  SKIN: No obvious rash, lesion, or ulcer. But has jaundice.   LABORATORY PANEL:   CBC  Recent Labs Lab 09/21/15 0503  WBC 3.2*  HGB 8.5*  HCT 24.5*  PLT 47*   ------------------------------------------------------------------------------------------------------------------  Chemistries   Recent Labs Lab 09/19/15 1023 09/21/15 0503  NA 132* 140  K 4.0 3.9  CL 99* 105  CO2 27 29  GLUCOSE 265* 160*  BUN 19 18  CREATININE 1.06 0.78  CALCIUM 8.7* 8.7*  MG  --  1.6*  AST 98*  --   ALT 38  --   ALKPHOS 155*  --   BILITOT 8.8*  --    ------------------------------------------------------------------------------------------------------------------  Cardiac Enzymes No results for input(s): TROPONINI in the last 168 hours. ------------------------------------------------------------------------------------------------------------------  RADIOLOGY:  US Paracentesis  09/20/2015   CLINICAL DATA:  Ascites.  EXAM: ULTRASOUND GUIDED PARACENTESIS  COMPARISON:  None.  PROCEDURE:  An ultrasound guided paracentesis was thoroughly discussed with the patient and questions  answered. The benefits, risks, alternatives and complications were also discussed. The patient understands and wishes to proceed with the procedure. Written consent was obtained.  Ultrasound was performed to localize and mark an adequate pocket of fluid in the right lower quadrant of the abdomen. The area was then prepped and draped in the normal sterile fashion. 1% Lidocaine was used for local anesthesia. Under ultrasound guidance a Safe-T-Centesis catheter was introduced. Paracentesis was performed. The catheter was removed and a dressing applied.  COMPLICATIONS: None immediate.  FINDINGS: A total of approximately 4.8 L of serous fluid was removed. A fluid sample was sent for laboratory analysis.  IMPRESSION: Successful ultrasound guided paracentesis yielding 4.8 L of ascites.   Electronically Signed   By: Marijo Conception, M.D.   On: 09/20/2015 17:24    EKG:   Orders placed or performed during the hospital encounter of 09/19/15  . ED EKG  . ED EKG    ASSESSMENT AND PLAN:   #1 GI bleed. -Patient does have history of rectal bleed, however hemoglobin drop and persistent bleeding requiring admission at this time. Most recent GI bleed was noted at 3 AM 30th of September 2016, no bleeding over the past 20 hours. Patient may need to undergo repeat EGD if bleeding recurs.   #2 Anemia of acute blood loss and chronic disease. Status post 2 unit of packed RBC transfusion. The hemoglobin is still 8.5 today. Follow-up CBC. EGD done about 3-4 weeks ago showing posterior gastropathy and gastritis. Continue PPI. Dr. Tiffany Kocher did rectal exam which didn't show any active bleeding. May need to reassess if bleeding recurs.  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 Status post paracentesis, less shortness of breath now. Start patient on spironolactone. Discontinue Rocephin as no SBP and no bleeding at present  #4. Thrombocytopenia, status post platelet transfusion for paracentesis yesterday.  Platelet count was 47. Recheck tomorrow . #5. Hepatic encephalopathy, seems to be stable-Continue lactulose and rifaximine  # 6 diabetes mellitus-insulin-dependent. -Continue Lantus and sliding scale insulin. Resume metformin upon discharge.  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 Follow CBC tomorrow morning      All the records are reviewed and case discussed with Care Management/Social Workerr. Management plans discussed with the patient, family and they are in agreement.  CODE STATUS: DO NOT RESUSCITATE.  TOTAL TIME TAKING CARE OF THIS PATIENT: 45 minutes.   POSSIBLE D/C IN 2-3 DAYS, DEPENDING ON CLINICAL CONDITION.   Theodoro Grist M.D on 09/21/2015 at 2:16 PM  Between 7am to 6pm - Pager - [phone]  After 6pm go to [email or web address] - password [email or web address]  [facility]  Office  [phone]  CC: Primary care physician; Adrian Prows, MD

## 2015-09-21 NOTE — Plan of Care (Signed)
Individualization of Care Pt prefers to be called [name] Hx of liver cancer, nonalcoholic liver cirrhosis, ascites, HTN, COPD, insulin-dependent diabetes mellitus, chronic anemia from GI bleeds, stroke, depression, arthritis, hepatitis.     Problem: Discharge Progression Outcomes Goal: Other Discharge Outcomes/Goals Pain: Patient without complaints of pain this shift. Hemodynamically. Patient afebrile and VSS this shift. Received 1 unit of PRBC which completed at 22:39 this shift. No s/s of reaction, patient tolerated well. BP 118/49 mmHg  Pulse 66  Temp(Src) 98.5 F (36.9 C) (Oral)  Resp 16  Ht 5\' 4"  (1.626 m)  Wt 173 lb (78.472 kg)  BMI 29.68 kg/m2  SpO2 94%  Diet: Patient able to swallow pills whole with water.  On soft diet, ate 90% of his dinner this shift.  Activity: Patient unsteady on feet. Belly very distended and he is unable to see the floor. He was up to bedside commode with two person assist. Generalized weakness. Bed alarm on throughout shift for safety.

## 2015-09-21 NOTE — Progress Notes (Signed)
[facility]  [phone] [phone]     ID: [name] Sr. OB: 04/15/1946  MR#: [id number]  ZYS#:[id number]  Patient Care Team: Adrian Prows, MD as PCP - General (Infectious Diseases) Manya Silvas, MD as PCP - Gastroenterology (Gastroenterology)  CHIEF COMPLAINT/DIAGNOSIS:  1. Newly diagnosed left liver lobe masses, multicentric (largest lesion 3.8x4.6 cm) with serum alpha-fetoprotein 11959. Indicative of advanced hepatocellular carcinoma given known history of liver cirrhosis. CT scan of the chest/abdomen/pelvis on 08/29/15 - IMPRESSION: 1. Stigmata of cirrhosis with portal hypertension, including splenomegaly, similar to prior examination 12/25/2014. Importantly, today's study demonstrates interval enlargement of the previously noted lesion in the left lobe of the liver, which is currently a 3.8 x 4.6 cm mass. In addition, there are several separate lesions in the left lobe of the liver on this background of cirrhosis, findings are highly concerning for hepatocellular carcinoma (potentially multicentric), and further evaluation of AFP level in addition to MRI of the abdomen with and without IV gadolinium is recommended for more definitive evaluation at this time.  2. Moderate volume of ascites.  3. Additional incidental findings, as above.  Per discussion with interventional radiology, not a candidate for local therapies due to markedly elevated bilirubin. Not a candidate for liver transplant due to size of the largest lesion. Also not started on Nexavar because of bilirubin running >4 (~8-9).  2. Anemia, seondary to chronic liver disease and h/o iron deficiency - hemoglobin 7.8 on 07/21/12 alongwith elevated TIBC along with low iron saturation of 11%, started IV Venofer on 07/26/12. Got Venofer 500 mg in Feb 2014 and in Oct 2014.   3. Chronic Pancytopenia, ascites - likely from underlying chronic liver disease (history of non-alcoholic cirrhosis with  splenomegaly/hypersplenism).  Workup otherwise negative for underlying hematological disorder (including peripheral blood immunophenotyping study, BCR-ABL study, [id number] mutation, ANA, B12, folate, HBsAg, HCV Ab,  HIV Ab.  Iron study showed mild iron deficiency).   HISTORY OF PRESENT ILLNESS:  Patient returns for continued oncology follow-up, he was seen during recent hospitalization at which time he was diagnosed with 4.6 cm mass in the left lobe of the liver along with several separate lesions in the left lobe of the liver. He also had remarkable abnormal serum alpha-fetoprotein of greater than 11,000. As discussed above, he is not a candidate for treatment at this time due to markedly abnormal liver function (bilirubin greater than 8-9). States that he has tried to eat better at home for the last 2-3 weeks but continues to have severe weakness and is mostly resting in bed or sitting in chair. He has occasional pain in the right upper quadrant area, otherwise denies major pain issues. No nausea or vomiting. Denies any obvious bleeding symptoms. No fevers.    REVIEW OF SYSTEMS:   ROS As in HPI above. In addition, no new headaches or focal weakness.  No new sore throat, cough, sputum, hemoptysis or chest pain. No dizziness or palpitation. No abdominal pain, constipation, diarrhea, dysuria or hematuria. He has jaundice, otherwise denies new skin rash. No new paresthesias in extremities. PS ECOG 3.  PAST MEDICAL HISTORY: Reviewed. Past Medical History  Diagnosis Date  . Stroke   . Hypertension   . COPD (chronic obstructive pulmonary disease)   . Diabetes mellitus without complication   . H/O hiatal hernia   . Depression   . Arthritis   . Hepatitis     NASH  . Cirrhosis of liver not due to alcohol   .  Obstructive sleep apnea 11/20/2014  . Hyperlipidemia   . Hiatal hernia   . GI bleeds, multiple during admission   . Restless leg syndrome   . Cancer     liver          Non-alcoholic  cirrhosis of the liver.  Hyperlipidemia.  Obstructive sleep apnea.  Transient ischemic attack.  Hypertension.  Diabetes.  Arthritis.  Gout.  Blindness.   Hiatal hernia.   Restless leg syndrome.  History of GI bleed.   Appendectomy.   Laser surgery on eyes.  Nose surgery.   Leg surgery.  PAST SURGICAL HISTORY: Reviewed. Past Surgical History  Procedure Laterality Date  . Nose surgery    . Appendectomy    . Cardiac catheterization    . Eye surgery      leaglly blind  . Anterior cervical decompression/discectomy fusion 4 levels N/A 11/13/2014    Procedure:  Cervical three-four, Cervical four-five, Cervical five-six, Cervical six- seven anterior cervical decompression with fusion and interbody prosthesis plating and bonegraft;  Surgeon: Consuella Lose, MD;  Location: [facility];  Service: Neurosurgery;  Laterality: N/A;  . Esophagogastroduodenoscopy N/A 08/23/2015    Procedure: ESOPHAGOGASTRODUODENOSCOPY (EGD);  Surgeon: Manya Silvas, MD;  Location: [facility] ENDOSCOPY;  Service: Endoscopy;  Laterality: N/A;  . Esophagogastroduodenoscopy (egd) with propofol N/A 07/20/2015    Procedure: ESOPHAGOGASTRODUODENOSCOPY (EGD) WITH PROPOFOL;  Surgeon: Lucilla Lame, MD;  Location: [facility] ENDOSCOPY;  Service: Endoscopy;  Laterality: N/A;    FAMILY HISTORY: Reviewed. Family History  Problem Relation Age of Onset  . Kidney failure Sister     CKD  . Diabetes Mellitus II Sister     SOCIAL HISTORY: Reviewed. Social History  Substance Use Topics  . Smoking status: Never Smoker   . Smokeless tobacco: Never Used  . Alcohol Use: No    Allergies  Allergen Reactions  . Actos [Pioglitazone] Anaphylaxis  . Aminophylline Other (See Comments)    Reaction: Unknown  . Amitriptyline Other (See Comments)    Reaction: Unknown   . Ampicillin Hives  . Hctz [Hydrochlorothiazide] Other (See Comments)    Reaction: Unknown   . Naproxen Other (See Comments)    Reaction:  GI bleeding   . Penicillins  Rash    No current facility-administered medications for this visit.   No current outpatient prescriptions on file.   Facility-Administered Medications Ordered in Other Visits  Medication Dose Route Frequency Provider Last Rate Last Dose  . allopurinol (ZYLOPRIM) tablet 100 mg  100 mg Oral BID Gladstone Lighter, MD   100 mg at 09/21/15 1000  . benzonatate (TESSALON) capsule 100 mg  100 mg Oral TID Gladstone Lighter, MD   100 mg at 09/21/15 1000  . cholecalciferol (VITAMIN D) tablet 1,000 Units  1,000 Units Oral Daily Gladstone Lighter, MD   1,000 Units at 09/21/15 1000  . cholestyramine (QUESTRAN) packet 4 g  4 g Oral TID Gladstone Lighter, MD   4 g at 09/21/15 1000  . ferrous sulfate tablet 325 mg  325 mg Oral TID WC Gladstone Lighter, MD   325 mg at 09/21/15 1200  . guaiFENesin-codeine 100-10 MG/5ML solution 5 mL  5 mL Oral Q4H PRN Gladstone Lighter, MD      . heparin lock flush 100 unit/mL  500 Units Intracatheter Daily PRN Leia Alf, MD      . heparin lock flush 100 unit/mL  250 Units Intracatheter PRN Leia Alf, MD      . hydrOXYzine (ATARAX/VISTARIL) tablet 12.5 mg  12.5 mg Oral  TID PRN Gladstone Lighter, MD      . insulin aspart (novoLOG) injection 0-5 Units  0-5 Units Subcutaneous QHS Demetrios Loll, MD   0 Units at 09/20/15 2206  . insulin aspart (novoLOG) injection 0-9 Units  0-9 Units Subcutaneous TID WC Demetrios Loll, MD   5 Units at 09/21/15 1200  . insulin glargine (LANTUS) injection 15 Units  15 Units Subcutaneous QHS Gladstone Lighter, MD   15 Units at 09/20/15 2223  . lactulose (CHRONULAC) 10 GM/15ML solution 20 g  20 g Oral BID Gladstone Lighter, MD   20 g at 09/21/15 1000  . ondansetron (ZOFRAN-ODT) disintegrating tablet 4 mg  4 mg Oral Q8H PRN Gladstone Lighter, MD   4 mg at 09/21/15 0437  . pantoprazole (PROTONIX) EC tablet 40 mg  40 mg Oral Daily Gladstone Lighter, MD   40 mg at 09/21/15 1000  . polyvinyl alcohol (LIQUIFILM TEARS) 1.4 % ophthalmic solution 1-2 drop  1-2  drop Both Eyes QID Gladstone Lighter, MD   1 drop at 09/20/15 2223  . rifaximin (XIFAXAN) tablet 550 mg  550 mg Oral BID Gladstone Lighter, MD   550 mg at 09/21/15 1000  . sertraline (ZOLOFT) tablet 50 mg  50 mg Oral Daily Gladstone Lighter, MD   50 mg at 09/21/15 1000  . sodium chloride 0.9 % injection 10 mL  10 mL Intracatheter PRN Leia Alf, MD      . sodium chloride 0.9 % injection 3 mL  3 mL Intracatheter PRN Leia Alf, MD      . spironolactone (ALDACTONE) tablet 25 mg  25 mg Oral BID Theodoro Grist, MD        PHYSICAL EXAM: Filed Vitals:   09/18/15 1127  BP: 116/64  Pulse: 88  Temp: 96.2 F (35.7 C)  Resp: 18     There is no weight on file to calculate BMI.       GENERAL: Patient is very weak-looking, sitting in the chest, otherwise alert and oriented and in no acute distress. Markedly icteric. HEENT: EOMs intact. No cervical lymphadenopathy. CVS: S1S2, regular LUNGS: Bilaterally clear to auscultation, no rhonchi. ABDOMEN: Soft, mildly distended, mild tenderness in the right upper quadrant but no guarding or rigidity. Bowel sounds present.   EXTREMITIES: Bilateral pedal edema.   LAB RESULTS: 09/18/15 - Hb 7.4, WBC 3.0, plt 38, bilirubin 8.0, AST 95, ALT 34, alkaline phosphatase 146, albumen 2.6, creatinine 0.97. 08/28/15 - serum AFP is 11959 (was 1.0 in Jan 2014)  STUDIES: CT scan of the chest/abdomen/pelvis on 08/29/15 - IMPRESSION: 1. Stigmata of cirrhosis with portal hypertension, including splenomegaly, similar to prior examination 12/25/2014. Importantly, today's study demonstrates interval enlargement of the previously noted lesion in the left lobe of the liver, which is currently a 3.8 x 4.6 cm mass. In addition, there are several separate lesions in the left lobe of the liver on this background of cirrhosis, findings are highly concerning for hepatocellular carcinoma (potentially multicentric), and further evaluation of AFP level in addition to MRI of the abdomen  with and without IV gadolinium is recommended for more definitive evaluation at this time.  2. Moderate volume of ascites.  3. Additional incidental findings, as above.   ASSESSMENT / PLAN:   1. Newly diagnosed left liver lobe masses, multicentric (largest lesion 3.8x4.6 cm) with serum alpha-fetoprotein 11959. Indicative of advanced hepatocellular carcinoma given known history of liver cirrhosis. CT scan of the chest/abdomen/pelvis on 08/29/15 - IMPRESSION: 1. Stigmata of cirrhosis with portal hypertension, including splenomegaly, similar  to prior examination 12/25/2014. Importantly, today's study demonstrates interval enlargement of the previously noted lesion in the left lobe of the liver, which is currently a 3.8 x 4.6 cm mass. In addition, there are several separate lesions in the left lobe of the liver on this background of cirrhosis, findings are highly concerning for hepatocellular carcinoma (potentially multicentric), and further evaluation of AFP level in addition to MRI of the abdomen with and without IV gadolinium is recommended for more definitive evaluation at this time.  2. Moderate volume of ascites.  3. Additional incidental findings, as above. - Labs from today and discussed with patient and his daughter present, have explained the bilirubin continues to remain significantly abnormal at 8.0 which makes him a poor candidate for treatment options (Per discussion with interventional radiology come, not a candidate for local therapies due to markedly elevated bilirubin. Not a candidate for liver transplant due to size of the largest lesion. Also not started on Nexavar because of bilirubin level). Patient is currently on home hospice and is agreeable to continue on this. States that he will continue to eat well and hope that he get stronger and jaundice improves, in which case, he will consider pursuing treatment. Otherwise patient understands that overall prognosis is extremely poor and is  comfortable with hospice at this time. [facility], patient and family present declined. We will see him back in 6 weeks with repeat labs.  2. Anemia, thrombocytopenia, ascites along with iron deficiency likely from GI blood loss (AVMs).  Patient has chronic pancytopenia secondary to known history of underlying chronic liver disease (history of non-alcoholic cirrhosis with splenomegaly/hypersplenism). Also has h/o iron deficiency s/p IV Venofer in past - he continues to be significantly anemic but is about transfusion range at 7.4 g. Patient also states that she does not do much activity and does not want to pursue transfusion at this time. He is currently under home hospice.   3. In between visits, patient advised to call hospice or here in case of any fevers, bleeding, acute sickness, or new symptoms. He is agreeable to this plan.    Leia Alf, MD   09/21/2015 5:47 PM

## 2015-09-21 NOTE — Progress Notes (Signed)
Subjective: Patient with Terry and anemia with a paracentesis yesterday. Hb 8.5 this am. No complaints.   Objective: Vital signs in last 24 hours: Filed Vitals:   09/20/15 2239 09/21/15 0436 09/21/15 0852 09/21/15 0856  BP: 118/49 110/46 91/19 101/40  Pulse: 66 82 93 91  Temp: 98.5 F (36.9 C) 97.7 F (36.5 C) 97.7 F (36.5 C)   TempSrc: Oral Oral Oral   Resp: 16 18 18    Height:      Weight:      SpO2:  94% 100%    Weight change:   Intake/Output Summary (Last 24 hours) at 09/21/15 0952 Last data filed at 09/20/15 2242  Gross per 24 hour  Intake 1034.13 ml  Output    650 ml  Net 384.13 ml     Exam: Regular rate and rhythm normal Tense, distended, positive ascites.   Lab Results: @LABTEST2 @ Micro Results: Recent Results (from the past 240 hour(s))  Body fluid culture     Status: None (Preliminary result)   Collection Time: 09/20/15  4:30 PM  Result Value Ref Range Status   Specimen Description PERITONEAL  Final   Special Requests NONE  Final   Gram Stain PENDING  Incomplete   Culture NO GROWTH < 24 HOURS  Final   Report Status PENDING  Incomplete   Studies/Results: US Paracentesis  09/20/2015   CLINICAL DATA:  Ascites.  EXAM: ULTRASOUND GUIDED PARACENTESIS  COMPARISON:  None.  PROCEDURE: An ultrasound guided paracentesis was thoroughly discussed with the patient and questions answered. The benefits, risks, alternatives and complications were also discussed. The patient understands and wishes to proceed with the procedure. Written consent was obtained.  Ultrasound was performed to localize and mark an adequate pocket of fluid in the right lower quadrant of the abdomen. The area was then prepped and draped in the normal sterile fashion. 1% Lidocaine was used for local anesthesia. Under ultrasound guidance a Safe-T-Centesis catheter was introduced. Paracentesis was performed. The catheter was removed and a dressing applied.  COMPLICATIONS: None immediate.  FINDINGS: A  total of approximately 4.8 L of serous fluid was removed. A fluid sample was sent for laboratory analysis.  IMPRESSION: Successful ultrasound guided paracentesis yielding 4.8 L of ascites.   Electronically Signed   By: Marijo Conception, M.D.   On: 09/20/2015 17:24   Medications: I have reviewed the patient's current medications. Scheduled Meds: . allopurinol  100 mg Oral BID  . benzonatate  100 mg Oral TID  . cefTRIAXone (ROCEPHIN)  IV  1 g Intravenous Q24H  . cholecalciferol  1,000 Units Oral Daily  . cholestyramine  4 g Oral TID  . ferrous sulfate  325 mg Oral TID WC  . insulin aspart  0-5 Units Subcutaneous QHS  . insulin aspart  0-9 Units Subcutaneous TID WC  . insulin glargine  15 Units Subcutaneous QHS  . lactulose  20 g Oral BID  . pantoprazole  40 mg Oral Daily  . polyvinyl alcohol  1-2 drop Both Eyes QID  . rifaximin  550 mg Oral BID  . sertraline  50 mg Oral Daily   Continuous Infusions:  PRN Meds:.guaiFENesin-codeine, heparin lock flush, heparin lock flush, hydrOXYzine, ondansetron, sodium chloride, sodium chloride Assessment: Active Problems:   GI bleed    Plan: Patient stable without any sign of active bleeding. Nothing new to add from GI point of view.    LOS: 2 days   Daren Allen Norris 09/21/2015, 9:52 AM

## 2015-09-21 NOTE — Plan of Care (Signed)
Problem: Discharge Progression Outcomes Goal: Other Discharge Outcomes/Goals Outcome: Progressing Plan of care progress to goal for: 1. Barriers:         No s/s of Barriers noted. 2. Discharge Plan:         Plan to Return Home with Family and Hospice after Discharge. 3. Pain:         Denies pain. 4. Hemodynamically Stable:         VSS.         Afebrile.         09/20/15- Received 2 Units of Platelets.         09/20/15- Received 1 Unit of PRBC's.         Labs Improving.         09/20/15-  4.8L Peritoneal Fluid Removed during Paracentesis. 5. Complications:         No Active signs of Bleeding. 6. Diet:         Soft Diet. Tolerating Well.         Thin Liquids. Tolerating Well.         Medications Crushed in Applesauce. 7. Activity:         Bedrest.         Increased Weakness.

## 2015-09-22 DIAGNOSIS — C220 Liver cell carcinoma: Secondary | ICD-10-CM

## 2015-09-22 LAB — PLATELET COUNT: PLATELETS: 42 10*3/uL — AB (ref 150–440)

## 2015-09-22 LAB — HEMOGLOBIN: Hemoglobin: 9.2 g/dL — ABNORMAL LOW (ref 13.0–18.0)

## 2015-09-22 LAB — GLUCOSE, CAPILLARY: GLUCOSE-CAPILLARY: 157 mg/dL — AB (ref 65–99)

## 2015-09-22 MED ORDER — SPIRONOLACTONE 25 MG PO TABS: 25.0000 mg | ORAL_TABLET | Freq: Two times a day (BID) | ORAL | Status: DC

## 2015-09-22 MED ORDER — CEPHALEXIN 500 MG PO CAPS: 500.0000 mg | ORAL_CAPSULE | Freq: Two times a day (BID) | ORAL | Status: DC

## 2015-09-22 NOTE — Discharge Summary (Addendum)
[name] at [place] NAME: [name]    MR#:  [id number]  DATE OF BIRTH:  1946/06/01  DATE OF ADMISSION:  09/19/2015 ADMITTING PHYSICIAN: Gladstone Lighter, MD  DATE OF DISCHARGE: No discharge date for patient encounter.  PRIMARY CARE PHYSICIAN: Ola Spurr, [name], MD     ADMISSION DIAGNOSIS:  Gastrointestinal hemorrhage, unspecified gastritis, unspecified gastrointestinal hemorrhage type [K92.2]  DISCHARGE DIAGNOSIS:  Principal Problem:   GI bleed Active Problems:   Posthemorrhagic anemia   Ascites   Liver cirrhosis (HCC)   Jaundice   SECONDARY DIAGNOSIS:   Past Medical History  Diagnosis Date  . Stroke   . Hypertension   . COPD (chronic obstructive pulmonary disease)   . Diabetes mellitus without complication   . H/O hiatal hernia   . Depression   . Arthritis   . Hepatitis     NASH  . Cirrhosis of liver not due to alcohol   . Obstructive sleep apnea 11/20/2014  . Hyperlipidemia   . Hiatal hernia   . GI bleeds, multiple during admission   . Restless leg syndrome   . Cancer     liver    .pro HOSPITAL COURSE:   Patient is a 69 year old Caucasian male with past medical history significant for history of liver cirrhosis and ascites, hypertension, COPD, diabetes, anemia due to GI bleed and recent admission for upper GI bleed during which patient underwent EGD revealing portal gastropathy comes back to the hospital due to recurrent rectal bleed. On arrival to the hospital. Patient's hemoglobin level was noted to be 7.4, being 8.6 on 09/15/2015. Patient was transfused packed red  blood cells after which she is hemoglobin level improved to 8.5 by 30th of September 2016. However, patient complained of abdominal distention and underwent paracentesis as well during this admission. Prior to paracentesis because of thrombocytopenia. He was also transfused platelets. Paracentesis yielded 4.8 liters of ascitic fluid. Ascitic  fluid analysis revealed 234, nucleated cells, 47 of them were lymphocytes and 52 were monocytes. Patient was managed on the Rocephin while he is was in the hospital. However, antibiotic was changed to Keflex upon discharge. By the day of discharge, second of October 2016. Patient felt satisfactory complained of no pain or bleeding. His hemoglobin level remained stable and he was ready to be discharged home. Discussion by problem #1 GI bleed. Patient does have history of rectal bleed, now had recurrent bleed and hemoglobin drop. Most recent GI bleed was noted at 3 AM 30th of September 2016, no bleeding over the past 48 hours. Appreciate gastroenterology consultation. Patient is to follow-up with Dr. Vira Agar as outpatient.  #2 Anemia of acute blood loss and chronic disease. Status post 2 unit of packed RBC transfusion. The hemoglobin is .9.2 today. Follow-up CBC as outpatient. EGD done about 3-4 weeks ago showing posterior gastropathy and gastritis. Continue PPI. Dr. Tiffany Kocher did rectal exam which didn't show any active bleeding. May need to reassess if bleeding recurs. Patient is to follow up with gastroenterologist as outpatient as above.   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 Status post paracentesis, less shortness of breath now. Continue patient on spironolactone and Lasix, following patient's kidney function and potassium levels closely. Now off Rocephin, initiate Keflex and continue for 7 days after discharge  #4. Thrombocytopenia, status post platelet transfusion for paracentesis . Platelet count was 47. Recheck today as needed and as outpatient . #5. Hepatic encephalopathy, seems to be stable-Continue lactulose and rifaximine  # 6  diabetes mellitus-insulin-dependent. -Continue Lantus and sliding scale insulin. Resume metformin upon discharge. 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 Follow CBC as  outpatient #8. Left liver lobe masses concerning for advanced hepatocellular carcinoma, patient was seen by oncologist who felt that patient is poor candidate for treatment options or transplant. Oncologist offered to get second opinion from the [facility]. However, patient and family declined. Patient is to see Dr. Ma Hillock as outpatient in about 6 weeks. Patient is to continue follow-up with hospice as outpatient DISCHARGE CONDITIONS:   Stable  CONSULTS OBTAINED:  Treatment Team:  Manya Silvas, MD Lucilla Lame, MD  DRUG ALLERGIES:   Allergies  Allergen Reactions  . Actos [Pioglitazone] Anaphylaxis  . Aminophylline Other (See Comments)    Reaction: Unknown  . Amitriptyline Other (See Comments)    Reaction: Unknown   . Ampicillin Hives  . Hctz [Hydrochlorothiazide] Other (See Comments)    Reaction: Unknown   . Naproxen Other (See Comments)    Reaction:  GI bleeding   . Penicillins Rash    DISCHARGE MEDICATIONS:   Current Discharge Medication List    START taking these medications   Details  spironolactone (ALDACTONE) 25 MG tablet Take 1 tablet (25 mg total) by mouth 2 (two) times daily. Qty: 60 tablet, Refills: 3      CONTINUE these medications which have NOT CHANGED   Details  allopurinol (ZYLOPRIM) 100 MG tablet Take 1 tablet (100 mg total) by mouth 2 (two) times daily. Qty: 60 tablet, Refills: 1    benzonatate (TESSALON) 100 MG capsule Take 1 capsule (100 mg total) by mouth 3 (three) times daily. Pt is hospice pt and they have given permission over the phone to cover med Qty: 45 capsule, Refills: 2    Carboxymethylcellul-Glycerin 0.5-0.9 % SOLN Apply 1-2 drops to eye 4 (four) times daily.    Cholecalciferol (VITAMIN D-3) 1000 UNITS CAPS Take 1 capsule (1,000 Units total) by mouth daily. Qty: 30 capsule, Refills: 1    cholestyramine (QUESTRAN) 4 G packet Take 1 packet (4 g total) by mouth 3 (three) times daily. Qty: 60 each, Refills: 12    ferrous  sulfate 325 (65 FE) MG tablet Take 1 tablet (325 mg total) by mouth 3 (three) times daily with meals. Qty: 90 tablet, Refills: 3    furosemide (LASIX) 40 MG tablet Take 1 tablet (40 mg total) by mouth daily. Qty: 30 tablet, Refills: 1    guaiFENesin-codeine (CHERATUSSIN AC) 100-10 MG/5ML syrup Take 5 mLs by mouth every 4 (four) hours as needed for cough. Qty: 280 mL, Refills: 1    hydrOXYzine (ATARAX/VISTARIL) 25 MG tablet 1/2 to 1 tablet tid as needed for itching Qty: 42 tablet, Refills: 1    insulin aspart (NOVOLOG) 100 UNIT/ML injection Inject 10 Units into the skin 3 (three) times daily with meals. Qty: 10 mL, Refills: 11    insulin glargine (LANTUS) 100 UNIT/ML injection Inject 15 Units into the skin at bedtime.    lactulose (CHRONULAC) 10 GM/15ML solution Take 45 mLs (30 g total) by mouth 2 (two) times daily. Qty: 240 mL, Refills: 0    metFORMIN (GLUMETZA) 500 MG (MOD) 24 hr tablet Take 1,000 mg by mouth daily with supper.    omeprazole (PRILOSEC) 20 MG capsule Take 1 capsule (20 mg total) by mouth 2 (two) times daily before a meal. Qty: 60 capsule, Refills: 1    ondansetron (ZOFRAN ODT) 4 MG disintegrating  tablet Take 1 tablet (4 mg total) by mouth every 8 (eight) hours as needed for nausea or vomiting. Qty: 20 tablet, Refills: 0    rifaximin (XIFAXAN) 550 MG TABS tablet Take 1 tablet (550 mg total) by mouth 2 (two) times daily. Qty: 60 tablet, Refills: 1    sertraline (ZOLOFT) 50 MG tablet Take 50 mg by mouth daily.      STOP taking these medications     acetaminophen (TYLENOL) 325 MG tablet          DISCHARGE INSTRUCTIONS:    Patient is to follow-up with primary care physician, Dr. Ola Spurr and gastroenterologist, Dr. Vira Agar  If you experience worsening of your admission symptoms, develop shortness of breath, life threatening emergency, suicidal or homicidal thoughts you must seek medical attention immediately by calling 911 or calling your MD immediately   if symptoms less severe.  You Must read complete instructions/literature along with all the possible adverse reactions/side effects for all the Medicines you take and that have been prescribed to you. Take any new Medicines after you have completely understood and accept all the possible adverse reactions/side effects.   Please note  You were cared for by a hospitalist during your hospital stay. If you have any questions about your discharge medications or the care you received while you were in the hospital after you are discharged, you can call the unit and asked to speak with the hospitalist on call if the hospitalist that took care of you is not available. Once you are discharged, your primary care physician will handle any further medical issues. Please note that NO REFILLS for any discharge medications will be authorized once you are discharged, as it is imperative that you return to your primary care physician (or establish a relationship with a primary care physician if you do not have one) for your aftercare needs so that they can reassess your need for medications and monitor your lab values.    Today   CHIEF COMPLAINT:   Chief Complaint  Patient presents with  . Anemia    HISTORY OF PRESENT ILLNESS:  Shota Kohrs  is a 69 y.o. male with a known history of history of liver cirrhosis and ascites, hypertension, COPD, diabetes, anemia due to GI bleed and recent admission for upper GI bleed during which patient underwent EGD revealing portal gastropathy comes back to the hospital due to recurrent rectal bleed. On arrival to the hospital. Patient's hemoglobin level was noted to be 7.4, being 8.6 on 09/15/2015. Patient was transfused packed red  blood cells after which she is hemoglobin level improved to 8.5 by 30th of September 2016. However, patient complained of abdominal distention and underwent paracentesis as well during this admission. Prior to paracentesis because of  thrombocytopenia. He was also transfused platelets. Paracentesis yielded 4.8 liters of ascitic fluid. Ascitic fluid analysis revealed 234, nucleated cells, 47 of them were lymphocytes and 52 were monocytes. Patient was managed on the Rocephin while he is was in the hospital. However, antibiotic was changed to Keflex upon discharge. By the day of discharge, second of October 2016. Patient felt satisfactory complained of no pain or bleeding. His hemoglobin level remained stable and he was ready to be discharged home. Patient had newly diagnosed left lower lobe masses,  multicentric largest lesion 3.8 of a 4.6 cm,  alpha-fetoprotein level was elevated at 11,959. Oncologist, Dr. Hurley Cisco felt that it is indicative of advanced hepatocellular carcinoma.  Discussion by problem #1 GI bleed. Patient does have history  of rectal bleed, now had recurrent bleed and hemoglobin drop. Most recent GI bleed was noted at 3 AM 30th of September 2016, no bleeding over the past 48 hours. Appreciate gastroenterology consultation. Patient is to follow-up with Dr. Vira Agar as outpatient.  #2 Anemia of acute blood loss and chronic disease. Status post 2 unit of packed RBC transfusion. The hemoglobin is .9.2 today. Follow-up CBC as outpatient. EGD done about 3-4 weeks ago showing posterior gastropathy and gastritis. Continue PPI. Dr. Tiffany Kocher did rectal exam which didn't show any active bleeding. May need to reassess if bleeding recurs. Patient is to follow up with gastroenterologist as outpatient as above.   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 Status post paracentesis, less shortness of breath now. Continue patient on spironolactone and Lasix, following patient's kidney function and potassium levels closely. Now off Rocephin, initiate Keflex and continue for 7 days after discharge  #4. Thrombocytopenia, status post platelet transfusion for paracentesis . Platelet count was 47. Recheck today as needed and as  outpatient . #5. Hepatic encephalopathy, seems to be stable-Continue lactulose and rifaximine  # 6 diabetes mellitus-insulin-dependent. -Continue Lantus and sliding scale insulin. Resume metformin upon discharge. 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 Follow CBC as outpatient #8. Left liver lobe masses concerning for advanced hepatocellular carcinoma, patient was seen by oncologist who felt that patient is poor candidate for treatment options or transplant. Oncologist offered to get second opinion from the [facility]. However, patient and family declined. Patient is to see Dr. Ma Hillock as outpatient in about 6 weeks. The patient is to continue follow-up with hospice as outpatient, for which she is agreeable   VITAL SIGNS:  Blood pressure 115/52, pulse 76, temperature 97.6 F (36.4 C), temperature source Oral, resp. rate 18, height 5\' 4"  (1.626 m), weight 78.472 kg (173 lb), SpO2 95 %.  I/O:   Intake/Output Summary (Last 24 hours) at 09/22/15 0736 Last data filed at 09/21/15 1800  Gross per 24 hour  Intake    480 ml  Output      0 ml  Net    480 ml    PHYSICAL EXAMINATION:  GENERAL:  69 y.o.-year-old patient lying in the bed with no acute distress.  EYES: Pupils equal, round, reactive to light and accommodation. No scleral icterus. Extraocular muscles intact.  HEENT: Head atraumatic, normocephalic. Oropharynx and nasopharynx clear.  NECK:  Supple, no jugular venous distention. No thyroid enlargement, no tenderness.  LUNGS: Normal breath sounds bilaterally, no wheezing, rales,rhonchi or crepitation. No use of accessory muscles of respiration.  CARDIOVASCULAR: S1, S2 normal. No murmurs, rubs, or gallops.  ABDOMEN: Soft, non-tender, non-distended, ascitic fluid is palpable. Bowel sounds present. No organomegaly or mass.  EXTREMITIES: No pedal edema, cyanosis, or clubbing.  NEUROLOGIC: Cranial nerves  II through XII are intact. Muscle strength 5/5 in all extremities. Sensation intact. Gait not checked.  PSYCHIATRIC: The patient is alert and oriented x 3.  SKIN: No obvious rash, lesion, or ulcer.   DATA REVIEW:   CBC  Recent Labs Lab 09/21/15 0503 09/22/15 0445  WBC 3.2*  --   HGB 8.5* 9.2*  HCT 24.5*  --   PLT 47*  --     Chemistries   Recent Labs Lab 09/19/15 1023 09/21/15 0503  NA 132* 140  K 4.0 3.9  CL 99* 105  CO2 27 29  GLUCOSE 265* 160*  BUN 19 18  CREATININE 1.06 0.78  CALCIUM 8.7* 8.7*  MG  --  1.6*  AST 98*  --   ALT 38  --   ALKPHOS 155*  --   BILITOT 8.8*  --     Cardiac Enzymes No results for input(s): TROPONINI in the last 168 hours.  Microbiology Results  Results for orders placed or performed during the hospital encounter of 09/19/15  Body fluid culture     Status: None (Preliminary result)   Collection Time: 09/20/15  4:30 PM  Result Value Ref Range Status   Specimen Description PERITONEAL  Final   Special Requests NONE  Final   Gram Stain PENDING  Incomplete   Culture NO GROWTH < 24 HOURS  Final   Report Status PENDING  Incomplete    RADIOLOGY:  US Paracentesis  09/20/2015   CLINICAL DATA:  Ascites.  EXAM: ULTRASOUND GUIDED PARACENTESIS  COMPARISON:  None.  PROCEDURE: An ultrasound guided paracentesis was thoroughly discussed with the patient and questions answered. The benefits, risks, alternatives and complications were also discussed. The patient understands and wishes to proceed with the procedure. Written consent was obtained.  Ultrasound was performed to localize and mark an adequate pocket of fluid in the right lower quadrant of the abdomen. The area was then prepped and draped in the normal sterile fashion. 1% Lidocaine was used for local anesthesia. Under ultrasound guidance a Safe-T-Centesis catheter was introduced. Paracentesis was performed. The catheter was removed and a dressing applied.  COMPLICATIONS: None immediate.   FINDINGS: A total of approximately 4.8 L of serous fluid was removed. A fluid sample was sent for laboratory analysis.  IMPRESSION: Successful ultrasound guided paracentesis yielding 4.8 L of ascites.   Electronically Signed   By: Marijo Conception, M.D.   On: 09/20/2015 17:24    EKG:   Orders placed or performed during the hospital encounter of 09/19/15  . ED EKG  . ED EKG      Management plans discussed with the patient, family and they are in agreement.  CODE STATUS:     Code Status Orders        Start     Ordered   09/20/15 1413  Do not attempt resuscitation (DNR)   Continuous    Question Answer Comment  In the event of cardiac or respiratory ARREST Do not call a "code blue"   In the event of cardiac or respiratory ARREST Do not perform Intubation, CPR, defibrillation or ACLS   In the event of cardiac or respiratory ARREST Use medication by any route, position, wound care, and other measures to relive pain and suffering. May use oxygen, suction and manual treatment of airway obstruction as needed for comfort.      09/20/15 1412    Advance Directive Documentation        Most Recent Value   Type of Advance Directive  Healthcare Power of Attorney, Living will   Pre-existing out of facility DNR order (yellow form or pink MOST form)     "MOST" Form in Place?        TOTAL TIME TAKING CARE OF THIS PATIENT: 45 minutes.    Theodoro Grist M.D on 09/22/2015 at 7:36 AM  Between 7am to 6pm - Pager - [phone]  After 6pm go to [email or web address] - password [email or web address] [facility]  [facility]  Office  [phone]  CC: Primary care physician; Adrian Prows, MD

## 2015-09-22 NOTE — Progress Notes (Addendum)
Home with in-home [facility] to resume care.

## 2015-09-22 NOTE — Plan of Care (Signed)
Problem: Discharge Progression Outcomes Goal: Other Discharge Outcomes/Goals Outcome: Progressing Plan of care progress to goal for: 1. Barriers:No s/s of Barriers noted. 2. Discharge Plan:Plan to Return Home with Family and Hospice after Discharge. 3. Pain:Denies pain. 4. Hemodynamically Stable:VSS. Afebrile. Labs Improving, continue to monitor labs.       5. Complications:No Active signs of Bleeding. 6. Diet:Tolerating diet, no c/o nausea or vomiting this shift. Medications Crushed in Applesauce. 7. Activity:Bedrest.Increased Weakness.Up to [facility] with 1 assist.

## 2015-09-22 NOTE — Care Management Note (Signed)
Case Management Note  Patient Details  Name: CONLAN MICELI Sr. MRN: [id number] Date of Birth: [birth year]  Subjective/Objective:    Call and faxed referral to [facility] to resume home health.  Discharged home today to family. Has all DME equipment in his home.                Action/Plan:   Expected Discharge Date:                  Expected Discharge Plan:     In-House Referral:     Discharge planning Services     Post Acute Care Choice:    Choice offered to:     DME Arranged:    DME Agency:     HH Arranged:    [place] Agency:     Status of Service:     Medicare Important Message Given:    Date Medicare IM Given:    Medicare IM give by:    Date Additional Medicare IM Given:    Additional Medicare Important Message give by:     If discussed at [place] of Stay Meetings, dates discussed:    Additional Comments:  Shamone Winzer A, RN 09/22/2015, 8:16 AM

## 2015-09-24 LAB — BODY FLUID CULTURE

## 2015-09-26 LAB — CYTOLOGY - NON PAP

## 2015-10-25 ENCOUNTER — Inpatient Hospital Stay

## 2015-10-25 ENCOUNTER — Inpatient Hospital Stay: Attending: Family Medicine | Admitting: Family Medicine

## 2015-10-25 VITALS — BP 119/57 | HR 2 | Temp 94.7°F | Wt 158.7 lb

## 2015-10-25 VITALS — BP 126/62 | HR 86 | Temp 96.0°F | Resp 20

## 2015-10-25 DIAGNOSIS — L299 Pruritus, unspecified: Secondary | ICD-10-CM

## 2015-10-25 DIAGNOSIS — R5383 Other fatigue: Secondary | ICD-10-CM | POA: Diagnosis not present

## 2015-10-25 DIAGNOSIS — K7460 Unspecified cirrhosis of liver: Secondary | ICD-10-CM | POA: Diagnosis not present

## 2015-10-25 DIAGNOSIS — D638 Anemia in other chronic diseases classified elsewhere: Secondary | ICD-10-CM | POA: Diagnosis not present

## 2015-10-25 DIAGNOSIS — R109 Unspecified abdominal pain: Secondary | ICD-10-CM | POA: Diagnosis not present

## 2015-10-25 DIAGNOSIS — D649 Anemia, unspecified: Secondary | ICD-10-CM

## 2015-10-25 DIAGNOSIS — Z8673 Personal history of transient ischemic attack (TIA), and cerebral infarction without residual deficits: Secondary | ICD-10-CM

## 2015-10-25 DIAGNOSIS — E785 Hyperlipidemia, unspecified: Secondary | ICD-10-CM

## 2015-10-25 DIAGNOSIS — C220 Liver cell carcinoma: Secondary | ICD-10-CM

## 2015-10-25 DIAGNOSIS — M129 Arthropathy, unspecified: Secondary | ICD-10-CM | POA: Diagnosis not present

## 2015-10-25 DIAGNOSIS — R531 Weakness: Secondary | ICD-10-CM | POA: Diagnosis not present

## 2015-10-25 DIAGNOSIS — R17 Unspecified jaundice: Secondary | ICD-10-CM

## 2015-10-25 DIAGNOSIS — I10 Essential (primary) hypertension: Secondary | ICD-10-CM

## 2015-10-25 DIAGNOSIS — J449 Chronic obstructive pulmonary disease, unspecified: Secondary | ICD-10-CM | POA: Diagnosis not present

## 2015-10-25 DIAGNOSIS — K449 Diaphragmatic hernia without obstruction or gangrene: Secondary | ICD-10-CM

## 2015-10-25 DIAGNOSIS — G2581 Restless legs syndrome: Secondary | ICD-10-CM

## 2015-10-25 DIAGNOSIS — R140 Abdominal distension (gaseous): Secondary | ICD-10-CM

## 2015-10-25 DIAGNOSIS — R188 Other ascites: Secondary | ICD-10-CM | POA: Diagnosis not present

## 2015-10-25 DIAGNOSIS — D7589 Other specified diseases of blood and blood-forming organs: Secondary | ICD-10-CM

## 2015-10-25 DIAGNOSIS — F329 Major depressive disorder, single episode, unspecified: Secondary | ICD-10-CM | POA: Diagnosis not present

## 2015-10-25 DIAGNOSIS — R161 Splenomegaly, not elsewhere classified: Secondary | ICD-10-CM

## 2015-10-25 DIAGNOSIS — G4730 Sleep apnea, unspecified: Secondary | ICD-10-CM | POA: Diagnosis not present

## 2015-10-25 DIAGNOSIS — E119 Type 2 diabetes mellitus without complications: Secondary | ICD-10-CM | POA: Diagnosis not present

## 2015-10-25 LAB — CBC WITH DIFFERENTIAL/PLATELET
Basophils Absolute: 0 10*3/uL (ref 0–0.1)
Basophils Relative: 1 %
EOS ABS: 0.2 10*3/uL (ref 0–0.7)
EOS PCT: 4 %
HCT: 21.7 % — ABNORMAL LOW (ref 40.0–52.0)
HEMOGLOBIN: 7.3 g/dL — AB (ref 13.0–18.0)
LYMPHS ABS: 0.4 10*3/uL — AB (ref 1.0–3.6)
Lymphocytes Relative: 10 %
MCH: 34.7 pg — AB (ref 26.0–34.0)
MCHC: 33.7 g/dL (ref 32.0–36.0)
MCV: 103 fL — ABNORMAL HIGH (ref 80.0–100.0)
MONO ABS: 0.3 10*3/uL (ref 0.2–1.0)
MONOS PCT: 7 %
NEUTROS PCT: 78 %
Neutro Abs: 3.2 10*3/uL (ref 1.4–6.5)
Platelets: 45 10*3/uL — ABNORMAL LOW (ref 150–440)
RBC: 2.11 MIL/uL — ABNORMAL LOW (ref 4.40–5.90)
RDW: 23.2 % — AB (ref 11.5–14.5)
WBC: 4.1 10*3/uL (ref 3.8–10.6)

## 2015-10-25 LAB — HEPATIC FUNCTION PANEL
ALBUMIN: 3 g/dL — AB (ref 3.5–5.0)
ALT: 35 U/L (ref 17–63)
AST: 86 U/L — ABNORMAL HIGH (ref 15–41)
Alkaline Phosphatase: 232 U/L — ABNORMAL HIGH (ref 38–126)
BILIRUBIN INDIRECT: 2.7 mg/dL — AB (ref 0.3–0.9)
Bilirubin, Direct: 2.9 mg/dL — ABNORMAL HIGH (ref 0.1–0.5)
TOTAL PROTEIN: 8.1 g/dL (ref 6.5–8.1)
Total Bilirubin: 5.6 mg/dL — ABNORMAL HIGH (ref 0.3–1.2)

## 2015-10-25 LAB — IRON AND TIBC
Iron: 46 ug/dL (ref 45–182)
Saturation Ratios: 25 % (ref 17.9–39.5)
TIBC: 187 ug/dL — ABNORMAL LOW (ref 250–450)
UIBC: 141 ug/dL

## 2015-10-25 LAB — CREATININE, SERUM
Creatinine, Ser: 1.47 mg/dL — ABNORMAL HIGH (ref 0.61–1.24)
GFR calc non Af Amer: 47 mL/min — ABNORMAL LOW (ref >60)
GFR, EST AFRICAN AMERICAN: 55 mL/min — AB (ref >60)

## 2015-10-25 LAB — PREPARE RBC (CROSSMATCH)

## 2015-10-25 LAB — FERRITIN: FERRITIN: 164 ng/mL (ref 24–336)

## 2015-10-25 LAB — SAMPLE TO BLOOD BANK

## 2015-10-25 MED ORDER — ACETAMINOPHEN 325 MG PO TABS
ORAL_TABLET | ORAL | Status: AC
Start: 2015-10-25 — End: 2015-10-25
  Filled 2015-10-25: qty 2

## 2015-10-25 MED ORDER — SODIUM CHLORIDE 0.9 % IJ SOLN
3.0000 mL | INTRAMUSCULAR | Status: DC | PRN
  Filled 2015-10-25: qty 10

## 2015-10-25 MED ORDER — DIPHENHYDRAMINE HCL 25 MG PO CAPS
ORAL_CAPSULE | ORAL | Status: AC
  Filled 2015-10-25: qty 1

## 2015-10-25 MED ORDER — DIPHENHYDRAMINE HCL 50 MG/ML IJ SOLN
25.0000 mg | Freq: Once | INTRAMUSCULAR | Status: AC
  Administered 2015-10-25: 25 mg via INTRAVENOUS

## 2015-10-25 MED ORDER — HEPARIN SOD (PORK) LOCK FLUSH 100 UNIT/ML IV SOLN: 250.0000 [IU] | INTRAVENOUS | Status: DC | PRN

## 2015-10-25 MED ORDER — ONDANSETRON 4 MG PO TBDP: 4.0000 mg | ORAL_TABLET | Freq: Three times a day (TID) | ORAL | Status: AC | PRN

## 2015-10-25 MED ORDER — DIPHENHYDRAMINE HCL 50 MG/ML IJ SOLN
INTRAMUSCULAR | Status: AC
  Filled 2015-10-25: qty 1

## 2015-10-25 MED ORDER — ACETAMINOPHEN 325 MG PO TABS
650.0000 mg | ORAL_TABLET | Freq: Once | ORAL | Status: AC
  Administered 2015-10-25: 650 mg via ORAL

## 2015-10-25 NOTE — Progress Notes (Signed)
[place]  [phone]  Fax:([phone]) [place] DOB: September 14, 1946  MR#: [id number]  QMV#:[id number]  Patient Care Team: Adrian Prows, MD as PCP - General (Infectious Diseases) Manya Silvas, MD as PCP - Gastroenterology (Gastroenterology)  CHIEF COMPLAINT:  Chief Complaint  Patient presents with  . OTHER   Patient with significant past medical history of hepatocellular carcinoma. Currently under care with hospice in the home.  INTERVAL HISTORY:  Patient is here for further evaluation and treatment consideration regarding hepatocellular carcinoma. Patient is currently under hospice services in the home. He reports having mild-to-moderate pain intermittently. He complains mostly of pruritus. Patient is notably jaundice with a very distended abdomen. He also reports having falls due to weakness.  REVIEW OF SYSTEMS:   Review of Systems  Constitutional: Positive for malaise/fatigue. Negative for fever, chills, weight loss and diaphoresis.  HENT: Negative for congestion, ear discharge, ear pain, hearing loss, nosebleeds, sore throat and tinnitus.   Eyes: Negative for blurred vision, double vision, photophobia, pain, discharge and redness.  Respiratory: Negative for cough, hemoptysis, sputum production, shortness of breath, wheezing and stridor.   Cardiovascular: Negative for chest pain, palpitations, orthopnea, claudication, leg swelling and PND.  Gastrointestinal: Positive for nausea. Negative for heartburn, vomiting, abdominal pain, diarrhea, constipation, blood in stool and melena.       Distention  Genitourinary: Negative.   Musculoskeletal: Positive for falls.  Skin: Positive for itching.  Neurological: Positive for weakness. Negative for dizziness, tingling, focal weakness, seizures and headaches.  Endo/Heme/Allergies: Does not bruise/bleed easily.  Psychiatric/Behavioral: Negative for depression. The patient is not  nervous/anxious and does not have insomnia.     As per HPI. Otherwise, a complete review of systems is negatve.  ONCOLOGY HISTORY: Oncology History   1. Newly diagnosed left liver lobe masses, multicentric (largest lesion 3.8x4.6 cm) with serum alpha-fetoprotein 11959. Indicative of advanced hepatocellular carcinoma given known history of liver cirrhosis. CT scan of the chest/abdomen/pelvis on 08/29/15 - IMPRESSION: 1. Stigmata of cirrhosis with portal hypertension, including splenomegaly, similar to prior examination 12/25/2014. Importantly, today's study demonstrates interval enlargement of the previously noted lesion in the left lobe of the liver, which is currently a 3.8 x 4.6 cm mass. In addition, there are several separate lesions in the left lobe of the liver on this background of cirrhosis, findings are highly concerning for hepatocellular carcinoma (potentially multicentric), and further evaluation of AFP level in addition to MRI of the abdomen with and without IV gadolinium is recommended for more definitive evaluation at this time. 2. Moderate volume of ascites. 3. Additional incidental findings, as above.  Per discussion with interventional radiology, not a candidate for local therapies due to markedly elevated bilirubin. Not a candidate for liver transplant due to size of the largest lesion. Also not started on Nexavar because of bilirubin running >4 (~8-9).  2. Anemia, seondary to chronic liver disease and h/o iron deficiency - hemoglobin 7.8 on 07/21/12 alongwith elevated TIBC along with low iron saturation of 11%, started IV Venofer on 07/26/12. Got Venofer 500 mg in Feb 2014 and in Oct 2014.   3. Chronic Pancytopenia, ascites - likely from underlying chronic liver disease (history of non-alcoholic cirrhosis with splenomegaly/hypersplenism). Workup otherwise negative for underlying hematological disorder (including peripheral blood immunophenotyping study, BCR-ABL study, [id number]  mutation, ANA, B12, folate, HBsAg, HCV Ab, HIV Ab. Iron study showed mild iron deficiency).     Hepatocellular carcinoma ([place])   09/22/2015 Initial Diagnosis Hepatocellular carcinoma (  [place])    PAST MEDICAL HISTORY: Past Medical History  Diagnosis Date  . Stroke   . Hypertension   . COPD (chronic obstructive pulmonary disease)   . Diabetes mellitus without complication   . H/O hiatal hernia   . Depression   . Arthritis   . Hepatitis     NASH  . Cirrhosis of liver not due to alcohol   . Obstructive sleep apnea 11/20/2014  . Hyperlipidemia   . Hiatal hernia   . GI bleeds, multiple during admission   . Restless leg syndrome   . Cancer     liver    PAST SURGICAL HISTORY: Past Surgical History  Procedure Laterality Date  . Nose surgery    . Appendectomy    . Cardiac catheterization    . Eye surgery      leaglly blind  . Anterior cervical decompression/discectomy fusion 4 levels N/A 11/13/2014    Procedure:  Cervical three-four, Cervical four-five, Cervical five-six, Cervical six- seven anterior cervical decompression with fusion and interbody prosthesis plating and bonegraft;  Surgeon: Consuella Lose, MD;  Location: [place] [facility]URO ORS;  Service: Neurosurgery;  Laterality: N/A;  . Esophagogastroduodenoscopy N/A 08/23/2015    Procedure: ESOPHAGOGASTRODUODENOSCOPY (EGD);  Surgeon: Manya Silvas, MD;  Location[facility] ENDOSCOPY;  Service: Endoscopy;  Laterality: N/A;  . Esophagogastroduodenoscopy (egd) with propofol N/A 07/20/2015    Procedure: ESOPHAGOGASTRODUODENOSCOPY (EGD) WITH PROPOFOL;  Surgeon: Lucilla Lame, MD;  Location: [facility] ENDOSCOPY;  Service: Endoscopy;  Laterality: N/A;    FAMILY HISTORY Family History  Problem Relation Age of Onset  . Kidney failure Sister     CKD  . Diabetes Mellitus II Sister     GYNECOLOGIC HISTORY:  No LMP for male patient.     ADVANCED DIRECTIVES:    HEALTH MAINTENANCE: Social History  Substance Use Topics  . Smoking status: Never  Smoker   . Smokeless tobacco: Never Used  . Alcohol Use: No     Colonoscopy:  PAP:  Bone density:  Lipid panel:  Allergies  Allergen Reactions  . Actos [Pioglitazone] Anaphylaxis  . Aminophylline Other (See Comments)    Reaction: Unknown  . Amitriptyline Other (See Comments)    Reaction: Unknown   . Ampicillin Hives  . Hctz [Hydrochlorothiazide] Other (See Comments)    Reaction: Unknown   . Naproxen Other (See Comments)    Reaction:  GI bleeding   . Penicillins Rash    Current Outpatient Prescriptions  Medication Sig Dispense Refill  . allopurinol (ZYLOPRIM) 100 MG tablet Take 1 tablet (100 mg total) by mouth 2 (two) times daily. 60 tablet 1  . benzonatate (TESSALON) 100 MG capsule Take 1 capsule (100 mg total) by mouth 3 (three) times daily. Pt is hospice pt and they have given permission over the phone to cover med 45 capsule 2  . Carboxymethylcellul-Glycerin 0.5-0.9 % SOLN Apply 1-2 drops to eye 4 (four) times daily.    . Cholecalciferol (VITAMIN D-3) 1000 UNITS CAPS Take 1 capsule (1,000 Units total) by mouth daily. 30 capsule 1  . colchicine 0.6 MG tablet Take 0.6 mg by mouth daily.    . ferrous sulfate 325 (65 FE) MG tablet Take 1 tablet (325 mg total) by mouth 3 (three) times daily with meals. 90 tablet 3  . furosemide (LASIX) 40 MG tablet Take 1 tablet (40 mg total) by mouth daily. 30 tablet 1  . hydrOXYzine (ATARAX/VISTARIL) 25 MG tablet 1/2 to 1 tablet tid as needed for itching 42 tablet 1  .  insulin aspart (NOVOLOG) 100 UNIT/ML injection Inject 10 Units into the skin 3 (three) times daily with meals. 10 mL 11  . insulin glargine (LANTUS) 100 UNIT/ML injection Inject 15 Units into the skin at bedtime.    Marland Kitchen lactulose (CHRONULAC) 10 GM/15ML solution Take 45 mLs (30 g total) by mouth 2 (two) times daily. (Patient taking differently: Take 20 g by mouth 2 (two) times daily. ) 240 mL 0  . Magnesium 250 MG TABS Take by mouth.    Marland Kitchen omeprazole (PRILOSEC) 20 MG capsule Take 1  capsule (20 mg total) by mouth 2 (two) times daily before a meal. 60 capsule 1  . ondansetron (ZOFRAN ODT) 4 MG disintegrating tablet Take 1 tablet (4 mg total) by mouth every 8 (eight) hours as needed for nausea or vomiting. 60 tablet 2  . rifaximin (XIFAXAN) 550 MG TABS tablet Take 1 tablet (550 mg total) by mouth 2 (two) times daily. 60 tablet 1  . sertraline (ZOLOFT) 50 MG tablet Take 50 mg by mouth daily.    Marland Kitchen spironolactone (ALDACTONE) 25 MG tablet Take 1 tablet (25 mg total) by mouth 2 (two) times daily. 60 tablet 3   No current facility-administered medications for this visit.    OBJECTIVE: BP 119/57 mmHg  Pulse 2  Temp(Src) 94.7 F (34.8 C) (Tympanic)  Wt 158 lb 11.7 oz (72 kg)   Body mass index is 27.23 kg/(m^2).    ECOG FS:3 - Symptomatic, >50% confined to bed  General: Thin, weak-appearing. Sitting in wheelchair in no acute distress. Eyes: Icteric sclera. HEENT: Normocephalic, moist mucous membranes, clear oropharnyx. Lungs: Clear to auscultation bilaterally. Heart: Regular rate and rhythm. No rubs, murmurs, or gallops. Abdomen: Markedly distended. Normal bowel sounds. Mild to moderate tenderness with palpation, no guarding or rigidity. Musculoskeletal: No edema, cyanosis, or clubbing. Neuro: Alert, answering all questions appropriately. Cranial nerves grossly intact. Skin: Notably jaundice.  Psych: Normal affect.   LAB RESULTS:  Office Visit on 10/25/2015  Component Date Value Ref Range Status  . Order Confirmation 10/25/2015 ORDER PROCESSED BY BLOOD BANK   Final  . ABO/RH(D) 10/25/2015 AB POS   Final  . Antibody Screen 10/25/2015 NEG   Final  . Sample Expiration 10/25/2015 10/28/2015   Final  . Unit Number 10/25/2015 [id number]   Final  . Blood Component Type 10/25/2015 RED CELLS,LR   Final  . Unit division 10/25/2015 00   Final  . Status of Unit 10/25/2015 ALLOCATED   Final  . Transfusion Status 10/25/2015 OK TO TRANSFUSE   Final  . Crossmatch Result  10/25/2015 Compatible   Final  Appointment on 10/25/2015  Component Date Value Ref Range Status  . WBC 10/25/2015 4.1  3.8 - 10.6 K/uL Final  . RBC 10/25/2015 2.11* 4.40 - 5.90 MIL/uL Final  . Hemoglobin 10/25/2015 7.3* 13.0 - 18.0 g/dL Final  . HCT 10/25/2015 21.7* 40.0 - 52.0 % Final  . MCV 10/25/2015 103.0* 80.0 - 100.0 fL Final  . MCH 10/25/2015 34.7* 26.0 - 34.0 pg Final  . MCHC 10/25/2015 33.7  32.0 - 36.0 g/dL Final  . RDW 10/25/2015 23.2* 11.5 - 14.5 % Final  . Platelets 10/25/2015 45* 150 - 440 K/uL Final  . Neutrophils Relative % 10/25/2015 78   Final  . Neutro Abs 10/25/2015 3.2  1.4 - 6.5 K/uL Final  . Lymphocytes Relative 10/25/2015 10   Final  . Lymphs Abs 10/25/2015 0.4* 1.0 - 3.6 K/uL Final  . Monocytes Relative 10/25/2015 7   Final  .  Monocytes Absolute 10/25/2015 0.3  0.2 - 1.0 K/uL Final  . Eosinophils Relative 10/25/2015 4   Final  . Eosinophils Absolute 10/25/2015 0.2  0 - 0.7 K/uL Final  . Basophils Relative 10/25/2015 1   Final  . Basophils Absolute 10/25/2015 0.0  0 - 0.1 K/uL Final  . Ferritin 10/25/2015 164  24 - 336 ng/mL Final  . Iron 10/25/2015 46  45 - 182 ug/dL Final  . TIBC 10/25/2015 187* 250 - 450 ug/dL Final  . Saturation Ratios 10/25/2015 25  17.9 - 39.5 % Final  . UIBC 10/25/2015 141   Final  . Creatinine, Ser 10/25/2015 1.47* 0.61 - 1.24 mg/dL Final  . GFR calc non Af Amer 10/25/2015 47* >60 mL/min Final  . GFR calc Af Amer 10/25/2015 55* >60 mL/min Final   Comment: (NOTE) The eGFR has been calculated using the CKD EPI equation. This calculation has not been validated in all clinical situations. eGFR's persistently <60 mL/min signify possible Chronic Kidney Disease.   . Total Protein 10/25/2015 8.1  6.5 - 8.1 g/dL Final  . Albumin 10/25/2015 3.0* 3.5 - 5.0 g/dL Final  . AST 10/25/2015 86* 15 - 41 U/L Final  . ALT 10/25/2015 35  17 - 63 U/L Final  . Alkaline Phosphatase 10/25/2015 232* 38 - 126 U/L Final  . Total Bilirubin 10/25/2015 5.6*  0.3 - 1.2 mg/dL Final  . Bilirubin, Direct 10/25/2015 2.9* 0.1 - 0.5 mg/dL Final  . Indirect Bilirubin 10/25/2015 2.7* 0.3 - 0.9 mg/dL Final  . Blood Bank Specimen 10/25/2015 SAMPLE AVAILABLE FOR TESTING   Final  . Sample Expiration 10/25/2015 10/28/2015   Final    STUDIES: No results found.  ASSESSMENT:  Advanced hepatocellular carcinoma.  PLAN:   1. Hepatocellular carcinoma. Patient with progressive disease, fairly newly diagnosed. As previously discussed with patient and family by Dr. Ma Hillock, patient's bilirubin continues to remain significantly abnormal which makes him a poor candidate for treatment. He is currently followed with hospice in the home. At his wishes he would like to continue with hospice treatment. 2. Anemia. Patient also with thrombocytopenia secondary to known history of underlying chronic liver disease, nonalcoholic psoriasis with splenomegaly. Anemia most likely related to iron deficiency as well as GI blood loss, patient with known history of AVMs. Hemoglobin today 7.3 g. Previously noted that patient had refused blood transfusions but he states that he is feeling so poorly lately that he is agreeable to one unit of blood today. We will schedule this for this afternoon.  Patient is to continue with home hospice services and with routine follow-up in this office in approximately 4 weeks.  Patient expressed understanding and was in agreement with this plan. He also understands that He can call clinic at any time with any questions, concerns, or complaints.   Dr. Oliva Bustard was available for consultation and review of plan of care for this patient.  Evlyn Kanner, NP   10/25/2015 11:25 AM

## 2015-10-25 NOTE — Progress Notes (Signed)
Patient requesting refill for Zofran.  [name] wants to know if there is something stronger for his itching.  Patient wants to be sure MD knows he is under home hospice.  Placed yellow bracelet on patient and educated him to wear it to each appointment of it lab only.

## 2015-10-28 LAB — TYPE AND SCREEN
ABO/RH(D): AB POS
Antibody Screen: NEGATIVE
UNIT DIVISION: 0

## 2015-10-30 ENCOUNTER — Other Ambulatory Visit: Payer: Medicare Other

## 2015-10-30 ENCOUNTER — Ambulatory Visit: Payer: Medicare Other | Admitting: Internal Medicine

## 2015-11-22 ENCOUNTER — Other Ambulatory Visit: Payer: Self-pay | Admitting: *Deleted

## 2015-11-22 ENCOUNTER — Encounter: Payer: Self-pay | Admitting: Internal Medicine

## 2015-11-22 ENCOUNTER — Inpatient Hospital Stay

## 2015-11-22 ENCOUNTER — Inpatient Hospital Stay (HOSPITAL_BASED_OUTPATIENT_CLINIC_OR_DEPARTMENT_OTHER): Admitting: Internal Medicine

## 2015-11-22 ENCOUNTER — Telehealth: Payer: Self-pay | Admitting: *Deleted

## 2015-11-22 ENCOUNTER — Inpatient Hospital Stay: Attending: Internal Medicine

## 2015-11-22 VITALS — BP 103/54 | HR 108 | Temp 97.4°F

## 2015-11-22 VITALS — BP 131/66 | HR 111 | Temp 96.2°F | Resp 20

## 2015-11-22 DIAGNOSIS — Z79899 Other long term (current) drug therapy: Secondary | ICD-10-CM | POA: Diagnosis not present

## 2015-11-22 DIAGNOSIS — D500 Iron deficiency anemia secondary to blood loss (chronic): Secondary | ICD-10-CM

## 2015-11-22 DIAGNOSIS — K7460 Unspecified cirrhosis of liver: Secondary | ICD-10-CM | POA: Insufficient documentation

## 2015-11-22 DIAGNOSIS — Z794 Long term (current) use of insulin: Secondary | ICD-10-CM

## 2015-11-22 DIAGNOSIS — F329 Major depressive disorder, single episode, unspecified: Secondary | ICD-10-CM | POA: Diagnosis not present

## 2015-11-22 DIAGNOSIS — J449 Chronic obstructive pulmonary disease, unspecified: Secondary | ICD-10-CM | POA: Insufficient documentation

## 2015-11-22 DIAGNOSIS — M7989 Other specified soft tissue disorders: Secondary | ICD-10-CM | POA: Diagnosis not present

## 2015-11-22 DIAGNOSIS — R17 Unspecified jaundice: Secondary | ICD-10-CM | POA: Diagnosis not present

## 2015-11-22 DIAGNOSIS — E119 Type 2 diabetes mellitus without complications: Secondary | ICD-10-CM

## 2015-11-22 DIAGNOSIS — G2581 Restless legs syndrome: Secondary | ICD-10-CM | POA: Insufficient documentation

## 2015-11-22 DIAGNOSIS — M48061 Spinal stenosis, lumbar region without neurogenic claudication: Secondary | ICD-10-CM | POA: Insufficient documentation

## 2015-11-22 DIAGNOSIS — R5383 Other fatigue: Secondary | ICD-10-CM | POA: Diagnosis not present

## 2015-11-22 DIAGNOSIS — Z8673 Personal history of transient ischemic attack (TIA), and cerebral infarction without residual deficits: Secondary | ICD-10-CM | POA: Diagnosis not present

## 2015-11-22 DIAGNOSIS — C220 Liver cell carcinoma: Secondary | ICD-10-CM | POA: Insufficient documentation

## 2015-11-22 DIAGNOSIS — K921 Melena: Secondary | ICD-10-CM | POA: Diagnosis not present

## 2015-11-22 DIAGNOSIS — Z8619 Personal history of other infectious and parasitic diseases: Secondary | ICD-10-CM

## 2015-11-22 DIAGNOSIS — D61818 Other pancytopenia: Secondary | ICD-10-CM

## 2015-11-22 DIAGNOSIS — E785 Hyperlipidemia, unspecified: Secondary | ICD-10-CM | POA: Diagnosis not present

## 2015-11-22 DIAGNOSIS — K3189 Other diseases of stomach and duodenum: Secondary | ICD-10-CM

## 2015-11-22 DIAGNOSIS — R630 Anorexia: Secondary | ICD-10-CM

## 2015-11-22 DIAGNOSIS — K449 Diaphragmatic hernia without obstruction or gangrene: Secondary | ICD-10-CM

## 2015-11-22 DIAGNOSIS — I10 Essential (primary) hypertension: Secondary | ICD-10-CM | POA: Insufficient documentation

## 2015-11-22 DIAGNOSIS — G4730 Sleep apnea, unspecified: Secondary | ICD-10-CM | POA: Diagnosis not present

## 2015-11-22 DIAGNOSIS — M129 Arthropathy, unspecified: Secondary | ICD-10-CM | POA: Insufficient documentation

## 2015-11-22 DIAGNOSIS — D649 Anemia, unspecified: Secondary | ICD-10-CM

## 2015-11-22 DIAGNOSIS — I2510 Atherosclerotic heart disease of native coronary artery without angina pectoris: Secondary | ICD-10-CM | POA: Insufficient documentation

## 2015-11-22 LAB — COMPREHENSIVE METABOLIC PANEL
ALBUMIN: 2.9 g/dL — AB (ref 3.5–5.0)
ALK PHOS: 278 U/L — AB (ref 38–126)
ALT: 32 U/L (ref 17–63)
ANION GAP: 9 (ref 5–15)
AST: 79 U/L — ABNORMAL HIGH (ref 15–41)
BUN: 43 mg/dL — ABNORMAL HIGH (ref 6–20)
CALCIUM: 9.4 mg/dL (ref 8.9–10.3)
CHLORIDE: 99 mmol/L — AB (ref 101–111)
CO2: 22 mmol/L (ref 22–32)
Creatinine, Ser: 1.62 mg/dL — ABNORMAL HIGH (ref 0.61–1.24)
GFR calc Af Amer: 49 mL/min — ABNORMAL LOW (ref >60)
GFR calc non Af Amer: 42 mL/min — ABNORMAL LOW (ref >60)
GLUCOSE: 157 mg/dL — AB (ref 65–99)
Potassium: 4.4 mmol/L (ref 3.5–5.1)
SODIUM: 130 mmol/L — AB (ref 135–145)
Total Bilirubin: 6.2 mg/dL — ABNORMAL HIGH (ref 0.3–1.2)
Total Protein: 7.8 g/dL (ref 6.5–8.1)

## 2015-11-22 LAB — CBC WITH DIFFERENTIAL/PLATELET
BASOS PCT: 1 %
Basophils Absolute: 0.1 10*3/uL (ref 0–0.1)
EOS ABS: 0.1 10*3/uL (ref 0–0.7)
Eosinophils Relative: 3 %
HCT: 19.7 % — ABNORMAL LOW (ref 40.0–52.0)
HEMOGLOBIN: 6.7 g/dL — AB (ref 13.0–18.0)
Lymphocytes Relative: 8 %
Lymphs Abs: 0.4 10*3/uL — ABNORMAL LOW (ref 1.0–3.6)
MCH: 36.2 pg — ABNORMAL HIGH (ref 26.0–34.0)
MCHC: 33.9 g/dL (ref 32.0–36.0)
MCV: 107 fL — ABNORMAL HIGH (ref 80.0–100.0)
MONOS PCT: 9 %
Monocytes Absolute: 0.5 10*3/uL (ref 0.2–1.0)
NEUTROS PCT: 79 %
Neutro Abs: 4.1 10*3/uL (ref 1.4–6.5)
PLATELETS: 47 10*3/uL — AB (ref 150–440)
RBC: 1.84 MIL/uL — AB (ref 4.40–5.90)
RDW: 26.2 % — ABNORMAL HIGH (ref 11.5–14.5)
WBC: 5.1 10*3/uL (ref 3.8–10.6)

## 2015-11-22 LAB — SAMPLE TO BLOOD BANK

## 2015-11-22 LAB — PREPARE RBC (CROSSMATCH)

## 2015-11-22 MED ORDER — FUROSEMIDE 10 MG/ML IJ SOLN
20.0000 mg | Freq: Once | INTRAMUSCULAR | Status: AC
  Administered 2015-11-22: 20 mg via INTRAVENOUS
  Filled 2015-11-22: qty 2

## 2015-11-22 MED ORDER — DIPHENHYDRAMINE HCL 25 MG PO CAPS
25.0000 mg | ORAL_CAPSULE | Freq: Once | ORAL | Status: AC
  Administered 2015-11-22: 25 mg via ORAL
  Filled 2015-11-22: qty 1

## 2015-11-22 MED ORDER — SODIUM CHLORIDE 0.9 % IV SOLN
250.0000 mL | Freq: Once | INTRAVENOUS | Status: AC
  Administered 2015-11-22: 250 mL via INTRAVENOUS
  Filled 2015-11-22: qty 250

## 2015-11-22 MED ORDER — ACETAMINOPHEN 325 MG PO TABS
650.0000 mg | ORAL_TABLET | Freq: Once | ORAL | Status: AC
  Administered 2015-11-22: 650 mg via ORAL
  Filled 2015-11-22: qty 2

## 2015-11-22 NOTE — Progress Notes (Signed)
The patient [place] OFFICE PROGRESS NOTE  Patient Care Team: Adrian Prows, MD as PCP - General (Infectious Diseases) Manya Silvas, MD as PCP - Gastroenterology (Gastroenterology)   SUMMARY OF ONCOLOGIC HISTORY:  # OCT 2016- HEPATOCELLULAR CA STAGE IV  # Cirrhosis/child C  # severe pancytopenia- severe anemia    INTERVAL HISTORY:  This is my first interaction with the patient since I joined the practice September 2016. I reviewed the patient's prior charts/pertinent labs/imaging in detail; findings are summarized above.  A very pleasant but unfortunate 69 year old male patient with above history of stage IV/hepatocellular cancer with decompensated cirrhosis Pugh child C currently in hospice is here for follow-up.  Patient has multiple complaints including swelling in the legs also chronic. Also complains of poor appetite; chronic abdominal distention. As per his daughter patient has blood in stools again this is intermittently/chronic.  He continues to be jaundiced; poor appetite. However overall he feels stable- denies any pain. He does complain of significant fatigue.     REVIEW OF SYSTEMS:  A complete 10 point review of system is done which is negative except mentioned above/history of present illness.   PAST MEDICAL HISTORY :  Past Medical History  Diagnosis Date  . Stroke ([place])   . Hypertension   . COPD (chronic obstructive pulmonary disease) ([place])   . Diabetes mellitus without complication ([place])   . H/O hiatal hernia   . Depression   . Arthritis   . Hepatitis     NASH  . Cirrhosis of liver not due to alcohol ([place])   . Obstructive sleep apnea 11/20/2014  . Hyperlipidemia   . Hiatal hernia   . GI bleeds, multiple during admission   . Restless leg syndrome   . Cancer [facility])     liver    PAST SURGICAL HISTORY :   Past Surgical History  Procedure Laterality Date  . Nose surgery    . Appendectomy    . Cardiac catheterization    . Eye  surgery      leaglly blind  . Anterior cervical decompression/discectomy fusion 4 levels N/A 11/13/2014    Procedure:  Cervical three-four, Cervical four-five, Cervical five-six, Cervical six- seven anterior cervical decompression with fusion and interbody prosthesis plating and bonegraft;  Surgeon: Consuella Lose, MD;  Location: [place] NEURO ORS;  Service: Neurosurgery;  Laterality: N/A;  . Esophagogastroduodenoscopy N/A 08/23/2015    Procedure: ESOPHAGOGASTRODUODENOSCOPY (EGD);  Surgeon: Manya Silvas, MD;  Location: [facility] ENDOSCOPY;  Service: Endoscopy;  Laterality: N/A;  . Esophagogastroduodenoscopy (egd) with propofol N/A 07/20/2015    Procedure: ESOPHAGOGASTRODUODENOSCOPY (EGD) WITH PROPOFOL;  Surgeon: Lucilla Lame, MD;  Location: [facility] ENDOSCOPY;  Service: Endoscopy;  Laterality: N/A;    FAMILY HISTORY :   Family History  Problem Relation Age of Onset  . Kidney failure Sister     CKD  . Diabetes Mellitus II Sister     SOCIAL HISTORY:   Social History  Substance Use Topics  . Smoking status: Never Smoker   . Smokeless tobacco: Never Used  . Alcohol Use: No    ALLERGIES:  is allergic to actos; aminophylline; amitriptyline; ampicillin; hctz; naproxen; and penicillins.  MEDICATIONS:  Current Outpatient Prescriptions  Medication Sig Dispense Refill  . allopurinol (ZYLOPRIM) 100 MG tablet Take 1 tablet (100 mg total) by mouth 2 (two) times daily. 60 tablet 1  . benzonatate (TESSALON) 100 MG capsule Take 1 capsule (100 mg total) by mouth 3 (three) times daily. Pt is hospice pt and  they have given permission over the phone to cover med 45 capsule 2  . Carboxymethylcellul-Glycerin 0.5-0.9 % SOLN Apply 1-2 drops to eye 4 (four) times daily.    . Cholecalciferol (VITAMIN D-3) 1000 UNITS CAPS Take 1 capsule (1,000 Units total) by mouth daily. 30 capsule 1  . colchicine 0.6 MG tablet Take 0.6 mg by mouth daily.    . ferrous sulfate 325 (65 FE) MG tablet Take 1 tablet (325 mg total) by mouth  3 (three) times daily with meals. 90 tablet 3  . furosemide (LASIX) 40 MG tablet Take 1 tablet (40 mg total) by mouth daily. 30 tablet 1  . hydrOXYzine (ATARAX/VISTARIL) 25 MG tablet 1/2 to 1 tablet tid as needed for itching 42 tablet 1  . insulin aspart (NOVOLOG) 100 UNIT/ML injection Inject 10 Units into the skin 3 (three) times daily with meals. 10 mL 11  . insulin glargine (LANTUS) 100 UNIT/ML injection Inject 15 Units into the skin at bedtime.    Marland Kitchen lactulose (CHRONULAC) 10 GM/15ML solution Take 45 mLs (30 g total) by mouth 2 (two) times daily. (Patient taking differently: Take 20 g by mouth 2 (two) times daily. ) 240 mL 0  . Magnesium 250 MG TABS Take by mouth.    Marland Kitchen omeprazole (PRILOSEC) 20 MG capsule Take 1 capsule (20 mg total) by mouth 2 (two) times daily before a meal. 60 capsule 1  . ondansetron (ZOFRAN ODT) 4 MG disintegrating tablet Take 1 tablet (4 mg total) by mouth every 8 (eight) hours as needed for nausea or vomiting. 60 tablet 2  . rifaximin (XIFAXAN) 550 MG TABS tablet Take 1 tablet (550 mg total) by mouth 2 (two) times daily. 60 tablet 1  . sertraline (ZOLOFT) 50 MG tablet Take 50 mg by mouth daily.    Marland Kitchen spironolactone (ALDACTONE) 25 MG tablet Take 1 tablet (25 mg total) by mouth 2 (two) times daily. 60 tablet 3   No current facility-administered medications for this visit.    PHYSICAL EXAMINATION: ECOG PERFORMANCE STATUS: 3 - Symptomatic, >50% confined to bed  BP 103/54 mmHg  Pulse 108  Temp(Src) 97.4 F (36.3 C) (Tympanic)  There were no vitals filed for this visit.  GENERAL: Sick appearing Caucasian male patient. Alert, no distress and comfortable.   He is in a wheelchair. Accompanied by his daughter. EYES: Positive for pallor positive for icterus. OROPHARYNX: no thrush or ulceration; NECK: supple, no masses felt LYMPH:  no palpable lymphadenopathy in the cervical, axillary or inguinal regions LUNGS: Decreased breath sounds bilaterally to bases. No wheeze or  crackles HEART/CVS: regular rate & rhythm and no murmurs; 3+ bilateral lower extremity edema. ABDOMEN:abdomen soft, non-tender and normal bowel sounds; positive for distention. Musculoskeletal:no cyanosis of digits and no clubbing  PSYCH: alert & oriented x 3 with fluent speech NEURO: no focal motor/sensory deficits SKIN: Jaundiced.  LABORATORY DATA:  I have reviewed the data as listed    Component Value Date/Time   NA 130* 11/22/2015 0943   NA 139 12/24/2014 2311   K 4.4 11/22/2015 0943   K 4.0 12/24/2014 2311   CL 99* 11/22/2015 0943   CL 107 12/24/2014 2311   CO2 22 11/22/2015 0943   CO2 24 12/24/2014 2311   GLUCOSE 157* 11/22/2015 0943   GLUCOSE 226* 12/24/2014 2311   BUN 43* 11/22/2015 0943   BUN 12 12/24/2014 2311   CREATININE 1.62* 11/22/2015 0943   CREATININE 0.90 12/24/2014 2311   CALCIUM 9.4 11/22/2015 0943   CALCIUM 8.6  12/24/2014 2311   PROT 7.8 11/22/2015 0943   PROT 8.1 12/24/2014 2311   ALBUMIN 2.9* 11/22/2015 0943   ALBUMIN 3.0* 12/24/2014 2311   AST 79* 11/22/2015 0943   AST 62* 12/24/2014 2311   ALT 32 11/22/2015 0943   ALT 31 12/24/2014 2311   ALKPHOS 278* 11/22/2015 0943   ALKPHOS 166* 12/24/2014 2311   BILITOT 6.2* 11/22/2015 0943   BILITOT 0.9 12/24/2014 2311   GFRNONAA 42* 11/22/2015 0943   GFRNONAA >60 12/24/2014 2311   GFRNONAA >60 06/18/2014 0408   GFRAA 49* 11/22/2015 0943   GFRAA >60 12/24/2014 2311   GFRAA >60 06/18/2014 0408    No results found for: SPEP, UPEP  Lab Results  Component Value Date   WBC 5.1 11/22/2015   NEUTROABS 4.1 11/22/2015   HGB 6.7* 11/22/2015   HCT 19.7* 11/22/2015   MCV 107.0* 11/22/2015   PLT 47* 11/22/2015      Chemistry      Component Value Date/Time   NA 130* 11/22/2015 0943   NA 139 12/24/2014 2311   K 4.4 11/22/2015 0943   K 4.0 12/24/2014 2311   CL 99* 11/22/2015 0943   CL 107 12/24/2014 2311   CO2 22 11/22/2015 0943   CO2 24 12/24/2014 2311   BUN 43* 11/22/2015 0943   BUN 12 12/24/2014  2311   CREATININE 1.62* 11/22/2015 0943   CREATININE 0.90 12/24/2014 2311      Component Value Date/Time   CALCIUM 9.4 11/22/2015 0943   CALCIUM 8.6 12/24/2014 2311   ALKPHOS 278* 11/22/2015 0943   ALKPHOS 166* 12/24/2014 2311   AST 79* 11/22/2015 0943   AST 62* 12/24/2014 2311   ALT 32 11/22/2015 0943   ALT 31 12/24/2014 2311   BILITOT 6.2* 11/22/2015 0943   BILITOT 0.9 12/24/2014 2311       RADIOGRAPHIC STUDIES: I have personally reviewed the radiological images as listed and agreed with the findings in the report. No results found.   ASSESSMENT & PLAN:   #  Hepatocellular cancer/stage IV- child Pugh C/D compensated cirrhosis- currently on hospice. Clinically patient is slightly worse- which is naturally expected given the progression of the disease/liver disease. I again had a long discussion regarding the limited treatment options of hepatocellular cancer with a decompensated liver cirrhosis. I agree with continued hospice care. He'll continue to hospice.  # Anemia hemoglobin 6.7- patient is symptomatic; patient is interested in blood transfusion. Recommend 1 unit of blood transfusion  # Jaundiced with bilirubin up to 6.7/ which is in fact slightly better than approximately 2 months ago when it was between 8-9. Patient's creatinine is also slightly worse at 1.67.    # As per the patient wishes/family wishes- repeat blood work in 4 weeks; follow up in 8 weeks with me/labs.    Orders Placed This Encounter  Procedures  . Type and screen    Standing Status: Future     Number of Occurrences:      Standing Expiration Date: 11/21/2016  . Prepare RBC    Standing Status: Standing     Number of Occurrences: 1     Standing Expiration Date:     Order Specific Question:  # of Units    Answer:  1 unit    Order Specific Question:  Transfusion Indications    Answer:  Symptomatic Anemia    Order Specific Question:  If emergent release call blood bank    Answer:  Not emergent  release   All questions  were answered. The patient knows to call the clinic with any problems, questions or concerns. No barriers to learning was detected.  # 40 minutes face-to-face with the patient discussing the above plan of care; more than 50% of time spent on prognosis/ natural history; counseling and coordination.     Cammie Sickle, MD 11/22/2015 10:34 AM

## 2015-11-22 NOTE — Telephone Encounter (Signed)
Pt's son called. Wants to know the lab results and what was discussed today in the clinic. Explained that patient's liver enzymes including bilirubin continue to increase and hgb was 6.7. Patient required 1 unit of blood. The plan is to bring pt back to clinic a monthly basis for lab/possible transfusion as needed. Explained that patient was very jaundice today upon examination. Ultimately, the liver cancer is progressing. Palliative interventions for symptom mgmt is needed at this time. Patient's son gave verbal understanding and appreciated the call back.

## 2015-11-23 LAB — TYPE AND SCREEN
ABO/RH(D): AB POS
ANTIBODY SCREEN: NEGATIVE
Unit division: 0

## 2015-12-02 ENCOUNTER — Emergency Department

## 2015-12-02 ENCOUNTER — Encounter: Payer: Self-pay | Admitting: Emergency Medicine

## 2015-12-02 ENCOUNTER — Inpatient Hospital Stay
Admission: EM | Admit: 2015-12-02 | Discharge: 2015-12-05 | DRG: 682 | Attending: Internal Medicine | Admitting: Internal Medicine

## 2015-12-02 DIAGNOSIS — Z79899 Other long term (current) drug therapy: Secondary | ICD-10-CM

## 2015-12-02 DIAGNOSIS — N179 Acute kidney failure, unspecified: Secondary | ICD-10-CM | POA: Diagnosis present

## 2015-12-02 DIAGNOSIS — D696 Thrombocytopenia, unspecified: Secondary | ICD-10-CM | POA: Diagnosis present

## 2015-12-02 DIAGNOSIS — J449 Chronic obstructive pulmonary disease, unspecified: Secondary | ICD-10-CM | POA: Diagnosis present

## 2015-12-02 DIAGNOSIS — Z8673 Personal history of transient ischemic attack (TIA), and cerebral infarction without residual deficits: Secondary | ICD-10-CM | POA: Diagnosis not present

## 2015-12-02 DIAGNOSIS — Z841 Family history of disorders of kidney and ureter: Secondary | ICD-10-CM | POA: Diagnosis not present

## 2015-12-02 DIAGNOSIS — L8990 Pressure ulcer of unspecified site, unspecified stage: Secondary | ICD-10-CM | POA: Diagnosis present

## 2015-12-02 DIAGNOSIS — Z9889 Other specified postprocedural states: Secondary | ICD-10-CM | POA: Diagnosis not present

## 2015-12-02 DIAGNOSIS — G4733 Obstructive sleep apnea (adult) (pediatric): Secondary | ICD-10-CM | POA: Diagnosis present

## 2015-12-02 DIAGNOSIS — Z6827 Body mass index (BMI) 27.0-27.9, adult: Secondary | ICD-10-CM

## 2015-12-02 DIAGNOSIS — Z66 Do not resuscitate: Secondary | ICD-10-CM | POA: Diagnosis present

## 2015-12-02 DIAGNOSIS — M109 Gout, unspecified: Secondary | ICD-10-CM | POA: Diagnosis present

## 2015-12-02 DIAGNOSIS — D684 Acquired coagulation factor deficiency: Secondary | ICD-10-CM | POA: Diagnosis present

## 2015-12-02 DIAGNOSIS — E860 Dehydration: Secondary | ICD-10-CM | POA: Diagnosis present

## 2015-12-02 DIAGNOSIS — M1990 Unspecified osteoarthritis, unspecified site: Secondary | ICD-10-CM | POA: Diagnosis present

## 2015-12-02 DIAGNOSIS — K7581 Nonalcoholic steatohepatitis (NASH): Secondary | ICD-10-CM | POA: Diagnosis present

## 2015-12-02 DIAGNOSIS — Z888 Allergy status to other drugs, medicaments and biological substances status: Secondary | ICD-10-CM | POA: Diagnosis not present

## 2015-12-02 DIAGNOSIS — G2581 Restless legs syndrome: Secondary | ICD-10-CM | POA: Diagnosis present

## 2015-12-02 DIAGNOSIS — Z794 Long term (current) use of insulin: Secondary | ICD-10-CM | POA: Diagnosis not present

## 2015-12-02 DIAGNOSIS — Z880 Allergy status to penicillin: Secondary | ICD-10-CM

## 2015-12-02 DIAGNOSIS — Z833 Family history of diabetes mellitus: Secondary | ICD-10-CM | POA: Diagnosis not present

## 2015-12-02 DIAGNOSIS — C220 Liver cell carcinoma: Secondary | ICD-10-CM | POA: Diagnosis present

## 2015-12-02 DIAGNOSIS — K767 Hepatorenal syndrome: Secondary | ICD-10-CM | POA: Diagnosis present

## 2015-12-02 DIAGNOSIS — I129 Hypertensive chronic kidney disease with stage 1 through stage 4 chronic kidney disease, or unspecified chronic kidney disease: Secondary | ICD-10-CM | POA: Diagnosis present

## 2015-12-02 DIAGNOSIS — D500 Iron deficiency anemia secondary to blood loss (chronic): Secondary | ICD-10-CM

## 2015-12-02 DIAGNOSIS — E43 Unspecified severe protein-calorie malnutrition: Secondary | ICD-10-CM | POA: Diagnosis present

## 2015-12-02 DIAGNOSIS — R188 Other ascites: Secondary | ICD-10-CM | POA: Diagnosis present

## 2015-12-02 DIAGNOSIS — E875 Hyperkalemia: Secondary | ICD-10-CM | POA: Diagnosis present

## 2015-12-02 DIAGNOSIS — C228 Malignant neoplasm of liver, primary, unspecified as to type: Secondary | ICD-10-CM | POA: Diagnosis not present

## 2015-12-02 DIAGNOSIS — D638 Anemia in other chronic diseases classified elsewhere: Secondary | ICD-10-CM | POA: Diagnosis present

## 2015-12-02 DIAGNOSIS — N189 Chronic kidney disease, unspecified: Secondary | ICD-10-CM | POA: Diagnosis present

## 2015-12-02 DIAGNOSIS — E871 Hypo-osmolality and hyponatremia: Secondary | ICD-10-CM | POA: Diagnosis present

## 2015-12-02 DIAGNOSIS — R000 Tachycardia, unspecified: Secondary | ICD-10-CM | POA: Diagnosis present

## 2015-12-02 DIAGNOSIS — K5900 Constipation, unspecified: Secondary | ICD-10-CM | POA: Diagnosis present

## 2015-12-02 DIAGNOSIS — K7460 Unspecified cirrhosis of liver: Secondary | ICD-10-CM | POA: Diagnosis present

## 2015-12-02 DIAGNOSIS — R1110 Vomiting, unspecified: Secondary | ICD-10-CM

## 2015-12-02 DIAGNOSIS — Z9049 Acquired absence of other specified parts of digestive tract: Secondary | ICD-10-CM

## 2015-12-02 DIAGNOSIS — R109 Unspecified abdominal pain: Secondary | ICD-10-CM | POA: Diagnosis not present

## 2015-12-02 DIAGNOSIS — H540 Blindness, both eyes: Secondary | ICD-10-CM | POA: Diagnosis present

## 2015-12-02 DIAGNOSIS — R140 Abdominal distension (gaseous): Secondary | ICD-10-CM | POA: Diagnosis not present

## 2015-12-02 DIAGNOSIS — E1122 Type 2 diabetes mellitus with diabetic chronic kidney disease: Secondary | ICD-10-CM | POA: Diagnosis present

## 2015-12-02 DIAGNOSIS — I959 Hypotension, unspecified: Secondary | ICD-10-CM | POA: Diagnosis present

## 2015-12-02 LAB — COMPREHENSIVE METABOLIC PANEL
ALK PHOS: 256 U/L — AB (ref 38–126)
ALT: 32 U/L (ref 17–63)
ANION GAP: 14 (ref 5–15)
AST: 77 U/L — ABNORMAL HIGH (ref 15–41)
Albumin: 2.9 g/dL — ABNORMAL LOW (ref 3.5–5.0)
BILIRUBIN TOTAL: 8.9 mg/dL — AB (ref 0.3–1.2)
BUN: 61 mg/dL — ABNORMAL HIGH (ref 6–20)
CALCIUM: 9.3 mg/dL (ref 8.9–10.3)
CO2: 16 mmol/L — ABNORMAL LOW (ref 22–32)
Chloride: 104 mmol/L (ref 101–111)
Creatinine, Ser: 2.66 mg/dL — ABNORMAL HIGH (ref 0.61–1.24)
GFR calc Af Amer: 27 mL/min — ABNORMAL LOW (ref >60)
GFR, EST NON AFRICAN AMERICAN: 23 mL/min — AB (ref >60)
Glucose, Bld: 160 mg/dL — ABNORMAL HIGH (ref 65–99)
POTASSIUM: 5.4 mmol/L — AB (ref 3.5–5.1)
Sodium: 134 mmol/L — ABNORMAL LOW (ref 135–145)
TOTAL PROTEIN: 7.6 g/dL (ref 6.5–8.1)

## 2015-12-02 LAB — CBC
HEMATOCRIT: 17.8 % — AB (ref 40.0–52.0)
Hemoglobin: 6 g/dL — ABNORMAL LOW (ref 13.0–18.0)
MCH: 36.8 pg — AB (ref 26.0–34.0)
MCHC: 33.7 g/dL (ref 32.0–36.0)
MCV: 109.1 fL — AB (ref 80.0–100.0)
PLATELETS: 53 10*3/uL — AB (ref 150–440)
RBC: 1.63 MIL/uL — AB (ref 4.40–5.90)
RDW: 27.4 % — ABNORMAL HIGH (ref 11.5–14.5)
WBC: 6.2 10*3/uL (ref 3.8–10.6)

## 2015-12-02 LAB — CBC WITH DIFFERENTIAL/PLATELET
Basophils Absolute: 0 10*3/uL (ref 0–0.1)
Basophils Relative: 0 %
EOS ABS: 0.1 10*3/uL (ref 0–0.7)
HCT: 20 % — ABNORMAL LOW (ref 40.0–52.0)
Hemoglobin: 6.7 g/dL — ABNORMAL LOW (ref 13.0–18.0)
LYMPHS ABS: 0.6 10*3/uL — AB (ref 1.0–3.6)
MCH: 36.5 pg — AB (ref 26.0–34.0)
MCHC: 33.3 g/dL (ref 32.0–36.0)
MCV: 109.8 fL — ABNORMAL HIGH (ref 80.0–100.0)
Monocytes Absolute: 0.3 10*3/uL (ref 0.2–1.0)
NEUTROS ABS: 5.9 10*3/uL (ref 1.4–6.5)
Neutrophils Relative %: 86 %
PLATELETS: 66 10*3/uL — AB (ref 150–440)
RBC: 1.83 MIL/uL — AB (ref 4.40–5.90)
RDW: 28 % — AB (ref 11.5–14.5)
WBC: 6.9 10*3/uL (ref 3.8–10.6)

## 2015-12-02 LAB — BODY FLUID CELL COUNT WITH DIFFERENTIAL
Eos, Fluid: 4 %
Lymphs, Fluid: 9 %
Monocyte-Macrophage-Serous Fluid: 8 %
NEUTROPHIL FLUID: 79 %
Other Cells, Fluid: 0 %
WBC FLUID: 201 uL

## 2015-12-02 LAB — CREATININE, SERUM
Creatinine, Ser: 2.61 mg/dL — ABNORMAL HIGH (ref 0.61–1.24)
GFR calc non Af Amer: 24 mL/min — ABNORMAL LOW (ref >60)
GFR, EST AFRICAN AMERICAN: 27 mL/min — AB (ref >60)

## 2015-12-02 LAB — PROTIME-INR
INR: 2.32
INR: 2.55
PROTHROMBIN TIME: 25.2 s — AB (ref 11.4–15.0)
PROTHROMBIN TIME: 27.1 s — AB (ref 11.4–15.0)

## 2015-12-02 LAB — PREPARE RBC (CROSSMATCH)

## 2015-12-02 LAB — AMMONIA: Ammonia: 63 umol/L — ABNORMAL HIGH (ref 9–35)

## 2015-12-02 MED ORDER — GUAIFENESIN 100 MG/5ML PO SYRP
200.0000 mg | ORAL_SOLUTION | Freq: Three times a day (TID) | ORAL | Status: DC | PRN
  Filled 2015-12-02: qty 10

## 2015-12-02 MED ORDER — ONDANSETRON HCL 4 MG/2ML IJ SOLN
4.0000 mg | Freq: Once | INTRAMUSCULAR | Status: AC
  Administered 2015-12-02: 4 mg via INTRAVENOUS
  Filled 2015-12-02: qty 2

## 2015-12-02 MED ORDER — SODIUM CHLORIDE 0.9 % IV SOLN
INTRAVENOUS | Status: DC
  Administered 2015-12-02 – 2015-12-04 (×3): via INTRAVENOUS

## 2015-12-02 MED ORDER — ONDANSETRON HCL 4 MG/2ML IJ SOLN: 4.0000 mg | Freq: Once | INTRAMUSCULAR | Status: DC

## 2015-12-02 MED ORDER — CARBOXYMETHYLCELLUL-GLYCERIN 0.5-0.9 % OP SOLN
1.0000 [drp] | Freq: Four times a day (QID) | OPHTHALMIC | Status: DC
  Filled 2015-12-02 (×2): qty 1

## 2015-12-02 MED ORDER — HYDROCODONE-ACETAMINOPHEN 5-325 MG PO TABS
1.0000 | ORAL_TABLET | Freq: Four times a day (QID) | ORAL | Status: DC | PRN
  Administered 2015-12-05: 1 via ORAL
  Filled 2015-12-02: qty 1

## 2015-12-02 MED ORDER — LACTULOSE 10 GM/15ML PO SOLN
30.0000 g | Freq: Two times a day (BID) | ORAL | Status: DC
  Administered 2015-12-02 – 2015-12-05 (×5): 30 g via ORAL
  Filled 2015-12-02 (×6): qty 60

## 2015-12-02 MED ORDER — DIPHENHYDRAMINE HCL 25 MG PO CAPS
25.0000 mg | ORAL_CAPSULE | Freq: Once | ORAL | Status: AC
  Administered 2015-12-02: 25 mg via ORAL
  Filled 2015-12-02: qty 1

## 2015-12-02 MED ORDER — TIMOLOL MALEATE 0.5 % OP SOLN
1.0000 [drp] | Freq: Two times a day (BID) | OPHTHALMIC | Status: DC
Start: 2015-12-02 — End: 2015-12-05
  Administered 2015-12-02 – 2015-12-05 (×5): 1 [drp] via OPHTHALMIC
  Filled 2015-12-02: qty 5

## 2015-12-02 MED ORDER — BRIMONIDINE TARTRATE-TIMOLOL 0.2-0.5 % OP SOLN
1.0000 [drp] | Freq: Two times a day (BID) | OPHTHALMIC | Status: DC
  Filled 2015-12-02: qty 5

## 2015-12-02 MED ORDER — SERTRALINE HCL 50 MG PO TABS
50.0000 mg | ORAL_TABLET | Freq: Every day | ORAL | Status: DC
Start: 2015-12-02 — End: 2015-12-05
  Administered 2015-12-02 – 2015-12-05 (×4): 50 mg via ORAL
  Filled 2015-12-02 (×4): qty 1

## 2015-12-02 MED ORDER — CHOLESTYRAMINE 4 G PO PACK
4.0000 g | PACK | Freq: Three times a day (TID) | ORAL | Status: DC
  Administered 2015-12-02 – 2015-12-04 (×6): 4 g via ORAL
  Filled 2015-12-02 (×9): qty 1

## 2015-12-02 MED ORDER — HYDROXYZINE HCL 25 MG PO TABS: 12.5000 mg | ORAL_TABLET | Freq: Three times a day (TID) | ORAL | Status: DC | PRN

## 2015-12-02 MED ORDER — POLYVINYL ALCOHOL 1.4 % OP SOLN
1.0000 [drp] | Freq: Four times a day (QID) | OPHTHALMIC | Status: DC
  Administered 2015-12-02 – 2015-12-05 (×9): 1 [drp] via OPHTHALMIC
  Filled 2015-12-02: qty 15

## 2015-12-02 MED ORDER — RIFAXIMIN 550 MG PO TABS
550.0000 mg | ORAL_TABLET | Freq: Two times a day (BID) | ORAL | Status: DC
  Administered 2015-12-02 – 2015-12-05 (×6): 550 mg via ORAL
  Filled 2015-12-02 (×6): qty 1

## 2015-12-02 MED ORDER — SODIUM CHLORIDE 0.9 % IV SOLN
Freq: Once | INTRAVENOUS | Status: AC
  Administered 2015-12-02: 15:00:00 via INTRAVENOUS

## 2015-12-02 MED ORDER — ALBUMIN HUMAN 25 % IV SOLN
12.5000 g | Freq: Four times a day (QID) | INTRAVENOUS | Status: DC
  Administered 2015-12-02 – 2015-12-05 (×10): 12.5 g via INTRAVENOUS
  Filled 2015-12-02 (×16): qty 50

## 2015-12-02 MED ORDER — ACETAMINOPHEN 325 MG PO TABS
650.0000 mg | ORAL_TABLET | Freq: Once | ORAL | Status: AC
  Administered 2015-12-02: 650 mg via ORAL
  Filled 2015-12-02: qty 2

## 2015-12-02 MED ORDER — SODIUM POLYSTYRENE SULFONATE 15 GM/60ML PO SUSP
15.0000 g | Freq: Once | ORAL | Status: AC
  Administered 2015-12-02: 15 g via ORAL
  Filled 2015-12-02: qty 60

## 2015-12-02 MED ORDER — MORPHINE SULFATE (PF) 2 MG/ML IV SOLN
2.0000 mg | Freq: Once | INTRAVENOUS | Status: AC
  Administered 2015-12-02: 2 mg via INTRAVENOUS
  Filled 2015-12-02: qty 1

## 2015-12-02 MED ORDER — ONDANSETRON HCL 4 MG/2ML IJ SOLN: 4.0000 mg | Freq: Four times a day (QID) | INTRAMUSCULAR | Status: DC | PRN

## 2015-12-02 MED ORDER — BRIMONIDINE TARTRATE 0.2 % OP SOLN
1.0000 [drp] | Freq: Two times a day (BID) | OPHTHALMIC | Status: DC
  Administered 2015-12-02 – 2015-12-05 (×5): 1 [drp] via OPHTHALMIC
  Filled 2015-12-02: qty 5

## 2015-12-02 MED ORDER — SODIUM CHLORIDE 0.9 % IV SOLN
Freq: Once | INTRAVENOUS | Status: AC
  Administered 2015-12-02: 20:00:00 via INTRAVENOUS

## 2015-12-02 MED ORDER — ONDANSETRON HCL 4 MG PO TABS: 4.0000 mg | ORAL_TABLET | Freq: Four times a day (QID) | ORAL | Status: DC | PRN

## 2015-12-02 MED ORDER — FERROUS SULFATE 325 (65 FE) MG PO TABS
325.0000 mg | ORAL_TABLET | Freq: Three times a day (TID) | ORAL | Status: DC
  Administered 2015-12-03 – 2015-12-05 (×7): 325 mg via ORAL
  Filled 2015-12-02 (×7): qty 1

## 2015-12-02 MED ORDER — BENZONATATE 100 MG PO CAPS: 100.0000 mg | ORAL_CAPSULE | Freq: Three times a day (TID) | ORAL | Status: DC | PRN

## 2015-12-02 MED ORDER — MORPHINE SULFATE (PF) 2 MG/ML IV SOLN
2.0000 mg | Freq: Once | INTRAVENOUS | Status: DC
Start: 2015-12-02 — End: 2015-12-02

## 2015-12-02 MED ORDER — PANTOPRAZOLE SODIUM 40 MG PO TBEC
40.0000 mg | DELAYED_RELEASE_TABLET | Freq: Every day | ORAL | Status: DC
  Administered 2015-12-02 – 2015-12-05 (×4): 40 mg via ORAL
  Filled 2015-12-02 (×4): qty 1

## 2015-12-02 MED ORDER — ALLOPURINOL 100 MG PO TABS
100.0000 mg | ORAL_TABLET | Freq: Two times a day (BID) | ORAL | Status: DC
  Administered 2015-12-02 – 2015-12-05 (×6): 100 mg via ORAL
  Filled 2015-12-02 (×6): qty 1

## 2015-12-02 NOTE — ED Provider Notes (Signed)
[facility] Emergency Department Provider Note  ____________________________________________  Time seen: Approximately 1:56 PM  I have reviewed the triage vital signs and the nursing notes.   HISTORY  Chief Complaint Abdominal Pain    HPI Luke NOTHDURFT Sr. is a 69 y.o. male patient complains of increasing swelling of his abdomen and legs. Patient has ascites from liver cancer. Patient complains of achiness in the belly. Patient is not sure if his belly pain is due to his ascites and the fact he hasn't had a stool for several days. Patient took lactulose yesterday and twice again today. Patient also has had a good bit of vomiting and has been unable to keep down fluids. Patient has not had a fever. Patient does feel weak. Patient is on hospice care.She has a history of anemia and low platelets.  Past Medical History  Diagnosis Date  . Stroke ([place])   . Hypertension   . COPD (chronic obstructive pulmonary disease) ([place])   . Diabetes mellitus without complication ([place])   . H/O hiatal hernia   . Depression   . Arthritis   . Hepatitis     NASH  . Cirrhosis of liver not due to alcohol ([place])   . Obstructive sleep apnea 11/20/2014  . Hyperlipidemia   . Hiatal hernia   . GI bleeds, multiple during admission   . Restless leg syndrome   . Cancer [facility])     liver    Patient Active Problem List   Diagnosis Date Noted  . Arteriosclerosis of coronary artery 11/22/2015  . Hepatic cirrhosis ([place]) 11/22/2015  . H/O transient cerebral ischemia 11/22/2015  . Apnea, sleep 11/22/2015  . Lumbar canal stenosis 11/22/2015  . Hepatocellular carcinoma ([place]) 09/22/2015  . Ascites 09/21/2015  . Jaundice 09/21/2015  . Liver cirrhosis ([place]) 09/21/2015  . Posthemorrhagic anemia   . GI bleed 09/19/2015  . Anemia with chronic illness 08/22/2015  . Coagulation disorder ([place]) 08/22/2015  . 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 Pancytopenia ([place]) 08/09/2015  .  SDH (subdural hematoma) ([place]) 08/09/2015  . Gout 08/09/2015  . Hypertension   . Cirrhosis of liver not due to alcohol ([place])   . Hyperlipidemia   . Iron deficiency anemia   . Gastric polyp   . Upper GI bleed 07/19/2015  . Chronic depression 04/05/2015  . UTI (lower urinary tract infection) 11/23/2014  . Thrombocytopenia ([place]) 11/23/2014  . Anemia 11/23/2014  . NASH (nonalcoholic steatohepatitis) 11/21/2014  . Stenosis of cervical spine region 11/20/2014  . Type 2 diabetes mellitus, uncontrolled, with retinopathy ([place]) 11/20/2014  . History of COPD 11/20/2014  . Diabetes mellitus with diabetic retinopathy   . Acute respiratory acidosis   . Encounter for intubation   . Encounter for orogastric (OG) tube placement   . Surgery, other elective   . Cervical spondylosis with myelopathy 11/13/2014  . Cervical spinal stenosis 10/11/2014  . Acute respiratory failure ([place]) 05/21/2014  . Acquired cyst of kidney 10/03/2012  . Type 2 diabetes mellitus ([place]) 03/26/2004    Past Surgical History  Procedure Laterality Date  . Nose surgery    . Appendectomy    . Cardiac catheterization    . Eye surgery      leaglly blind  . Anterior cervical decompression/discectomy fusion 4 levels N/A 11/13/2014    Procedure:  Cervical three-four, Cervical four-five, Cervical five-six, Cervical six- seven anterior cervical decompression with fusion and interbody prosthesis plating and bonegraft;  Surgeon: Consuella Lose, MD;  Location: [facility] NEURO ORS;  Service: Neurosurgery;  Laterality: N/A;  . Esophagogastroduodenoscopy N/A 08/23/2015    Procedure: ESOPHAGOGASTRODUODENOSCOPY (EGD);  Surgeon: Manya Silvas, MD;  Location: [facility] ENDOSCOPY;  Service: Endoscopy;  Laterality: N/A;  . Esophagogastroduodenoscopy (egd) with propofol N/A 07/20/2015    Procedure: ESOPHAGOGASTRODUODENOSCOPY (EGD) WITH PROPOFOL;  Surgeon: Lucilla Lame, MD;  Location: [facility] ENDOSCOPY;  Service: Endoscopy;  Laterality: N/A;    Current  Outpatient Rx  Name  Route  Sig  Dispense  Refill  . acetaminophen (TYLENOL) 325 MG tablet   Oral   Take 650 mg by mouth every 4 (four) hours as needed for mild pain, fever or headache.         . allopurinol (ZYLOPRIM) 100 MG tablet   Oral   Take 1 tablet (100 mg total) by mouth 2 (two) times daily.   60 tablet   1   . benzonatate (TESSALON) 100 MG capsule   Oral   Take 100 mg by mouth 3 (three) times daily as needed for cough.         . brimonidine-timolol (COMBIGAN) 0.2-0.5 % ophthalmic solution   Both Eyes   Place 1 drop into both eyes 2 (two) times daily.         . Carboxymethylcellul-Glycerin (OPTIVE) 0.5-0.9 % SOLN   Ophthalmic   Apply 1 drop to eye 4 (four) times daily.         . cholecalciferol (VITAMIN D) 1000 UNITS tablet   Oral   Take 1,000 Units by mouth daily.         . cholestyramine (QUESTRAN) 4 G packet   Oral   Take 4 g by mouth 3 (three) times daily.         . colchicine 0.6 MG tablet   Oral   Take 0.6 mg by mouth daily as needed (for gout flares).          . ferrous sulfate 325 (65 FE) MG tablet   Oral   Take 1 tablet (325 mg total) by mouth 3 (three) times daily with meals.   90 tablet   3   . furosemide (LASIX) 40 MG tablet   Oral   Take 1 tablet (40 mg total) by mouth daily.   30 tablet   1   . guaifenesin (ROBITUSSIN) 100 MG/5ML syrup   Oral   Take 200 mg by mouth 3 (three) times daily as needed for cough.         Marland Kitchen HYDROcodone-acetaminophen (NORCO/VICODIN) 5-325 MG tablet   Oral   Take 1 tablet by mouth every 6 (six) hours as needed for moderate pain.         . hydrOXYzine (ATARAX/VISTARIL) 25 MG tablet   Oral   Take 12.5-25 mg by mouth 3 (three) times daily as needed for itching.         . insulin aspart (NOVOLOG) 100 UNIT/ML injection   Subcutaneous   Inject 10 Units into the skin 3 (three) times daily with meals.   10 mL   11   . insulin glargine (LANTUS) 100 UNIT/ML injection   Subcutaneous   Inject 15  Units into the skin at bedtime.         Marland Kitchen lactulose (CHRONULAC) 10 GM/15ML solution   Oral   Take 45 mLs (30 g total) by mouth 2 (two) times daily.   240 mL   0   . omeprazole (PRILOSEC) 20 MG capsule   Oral   Take 1 capsule (20 mg total) by mouth 2 (  two) times daily before a meal.   60 capsule   1   . ondansetron (ZOFRAN ODT) 4 MG disintegrating tablet   Oral   Take 1 tablet (4 mg total) by mouth every 8 (eight) hours as needed for nausea or vomiting.   60 tablet   2   . rifaximin (XIFAXAN) 550 MG TABS tablet   Oral   Take 1 tablet (550 mg total) by mouth 2 (two) times daily.   60 tablet   1   . sertraline (ZOLOFT) 50 MG tablet   Oral   Take 50 mg by mouth daily.         Marland Kitchen spironolactone (ALDACTONE) 25 MG tablet   Oral   Take 1 tablet (25 mg total) by mouth 2 (two) times daily.   60 tablet   3     Allergies Actos; Aminophylline; Amitriptyline; Ampicillin; Hctz; Naproxen; and Penicillins  Family History  Problem Relation Age of Onset  . Kidney failure Sister     CKD  . Diabetes Mellitus II Sister     Social History Social History  Substance Use Topics  . Smoking status: Never Smoker   . Smokeless tobacco: Never Used  . Alcohol Use: No    Review of Systems Constitutional: No fever/chills Eyes: No visual changes. ENT: No sore throat. Cardiovascular: Denies chest pain. Respiratory: Denies shortness of breath. Gastrointestinal: The history of present illness Genitourinary: Negative for dysuria. Musculoskeletal: Negative for back pain. Skin: Negative for rash. Neurological: Negative for headaches, focal weakness or numbness.  10-point ROS otherwise negative.  ____________________________________________   PHYSICAL EXAM:  VITAL SIGNS: ED Triage Vitals  Enc Vitals Group     BP 12/02/15 1341 108/51 mmHg     Pulse Rate 12/02/15 1341 123     Resp 12/02/15 1341 20     Temp 12/02/15 1341 97.8 F (36.6 C)     Temp Source 12/02/15 1341 Oral      SpO2 12/02/15 1341 95 %     Weight 12/02/15 1341 173 lb 1 oz (78.5 kg)     Height 12/02/15 1341 5\' 4"  (1.626 m)     Head Cir --      Peak Flow --      Pain Score 12/02/15 1342 8     Pain Loc --      Pain Edu? --      Excl. in [place]? --     Constitutional: Alert and oriented. Chronically ill-appearing jaundiced male Eyes: Conjunctivae are normal. PERRL. EOMI. tenderness Head: Atraumatic. Nose: No congestion/rhinnorhea. Mouth/Throat: Mucous membranes are moist.  Oropharynx non-erythematous. Neck: No stridor.  Cardiovascular: Normal rate, regular rhythm. Grossly normal heart sounds.  Good peripheral circulation. Respiratory: Normal respiratory effort.  No retractions. Lungs CTAB. Gastrointestinal: Abdomen is soft but very large. Nontender to palpation. No bruising. Musculoskeletal: No lower extremity tenderness patient's legs are edematous bilaterally.  No joint effusions. Neurologic:  Normal speech and language. No gross focal neurologic deficits are appreciated. No gait instability. Skin:  Skin is warm, dry and jaundiced. .  ____________________________________________   LABS (all labs ordered are listed, but only abnormal results are displayed)  Labs Reviewed  COMPREHENSIVE METABOLIC PANEL - Abnormal; Notable for the following:    Sodium 134 (*)    Potassium 5.4 (*)    CO2 16 (*)    Glucose, Bld 160 (*)    BUN 61 (*)    Creatinine, Ser 2.66 (*)    Albumin 2.9 (*)    AST  77 (*)    Alkaline Phosphatase 256 (*)    Total Bilirubin 8.9 (*)    GFR calc non Af Amer 23 (*)    GFR calc Af Amer 27 (*)    All other components within normal limits  CBC WITH DIFFERENTIAL/PLATELET - Abnormal; Notable for the following:    RBC 1.83 (*)    Hemoglobin 6.7 (*)    HCT 20.0 (*)    MCV 109.8 (*)    MCH 36.5 (*)    RDW 28.0 (*)    Platelets 66 (*)    Lymphs Abs 0.6 (*)    All other components within normal limits  PROTIME-INR - Abnormal; Notable for the following:    Prothrombin Time  25.2 (*)    All other components within normal limits  BODY FLUID CULTURE  AMMONIA  BODY FLUID CELL COUNT WITH DIFFERENTIAL  TYPE AND SCREEN   ____________________________________________  EKG EKG read and interpreted by me shows sinus tachycardia at a rate of 123. Normal axis. Large Q waves in 3 some Q waves in F is well. There are no acute ST-T waves changes however T waves in V4 5 and 6 are flat and somewhat inverted. ____________________________________________  RADIOLOGY   ____________________________________________   PROCEDURES    ____________________________________________   INITIAL IMPRESSION / ASSESSMENT AND PLAN / ED COURSE  Pertinent labs & imaging results that were available during my care of the patient were reviewed by me and considered in my medical decision making (see chart for details).   ____________________________________________   FINAL CLINICAL IMPRESSION(S) / ED DIAGNOSES  Final diagnoses:  Intractable vomiting with nausea, vomiting of unspecified type  Ascites  Dehydration      Nena Polio, MD 12/02/15 [phone]

## 2015-12-02 NOTE — H&P (Signed)
[facility] [place] NAME: [name]    MR#:  [id number]  DATE OF BIRTH:  08-10-1946  DATE OF ADMISSION:  12/02/2015  PRIMARY CARE PHYSICIAN: [facility]   REQUESTING/REFERRING PHYSICIAN:   CHIEF COMPLAINT:   Chief Complaint  Patient presents with  . Abdominal Pain    HISTORY OF PRESENT ILLNESS: [name]  is a 69 y.o. 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 Patient was also constipated,  however given lactulose he had large amount of loose bowel movements earlier today. In the emergency room he was found to have severe ascites and underwent 6 L paracentesis after which hiis abdominal pain somewhat subsided. He feels very dehydrated and his kidney function is severely impaired as compared to prior studies, patient is also very anemic, tachycardic and hypotensive. Patient's family is concerned about significant lower extremity swelling. No obvious GI bleeding noted at home. Hospitalist services are contacted for admission PAST MEDICAL HISTORY:   Past Medical History  Diagnosis Date  . Stroke ([place])   . Hypertension   . COPD (chronic obstructive pulmonary disease) ([place])   . Diabetes mellitus without complication ([place])   . H/O hiatal hernia   . Depression   . Arthritis   . Hepatitis     NASH  . Cirrhosis of liver not due to alcohol ([place])   . Obstructive sleep apnea 11/20/2014  . Hyperlipidemia   . Hiatal hernia   . GI bleeds, multiple during admission   . Restless leg syndrome   . Cancer [facility])     liver    PAST SURGICAL HISTORY:  Past Surgical History  Procedure Laterality Date  . Nose surgery    . Appendectomy    . Cardiac catheterization    . Eye surgery       leaglly blind  . Anterior cervical decompression/discectomy fusion 4 levels N/A 11/13/2014    Procedure:  Cervical three-four, Cervical four-five, Cervical five-six, Cervical six- seven anterior cervical decompression with fusion and interbody prosthesis plating and bonegraft;  Surgeon: Consuella Lose, MD;  Location: [facility] NEURO ORS;  Service: Neurosurgery;  Laterality: N/A;  . Esophagogastroduodenoscopy N/A 08/23/2015    Procedure: ESOPHAGOGASTRODUODENOSCOPY (EGD);  Surgeon: Manya Silvas, MD;  Location: [facility] ENDOSCOPY;  Service: Endoscopy;  Laterality: N/A;  . Esophagogastroduodenoscopy (egd) with propofol N/A 07/20/2015    Procedure: ESOPHAGOGASTRODUODENOSCOPY (EGD) WITH PROPOFOL;  Surgeon: Lucilla Lame, MD;  Location: [facility] ENDOSCOPY;  Service: Endoscopy;  Laterality: N/A;    SOCIAL HISTORY:  Social History  Substance Use Topics  . Smoking status: Never Smoker   . Smokeless tobacco: Never Used  . Alcohol Use: No    FAMILY HISTORY:  Family History  Problem Relation Age of Onset  . Kidney failure Sister     CKD  . Diabetes Mellitus II Sister     DRUG ALLERGIES:  Allergies  Allergen Reactions  . Actos [Pioglitazone] Anaphylaxis  . Aminophylline Other (See Comments)    Reaction: Unknown  . Amitriptyline Other (See Comments)    Reaction: Unknown   . Ampicillin Hives and Other (See Comments)    Has patient had a PCN reaction causing immediate rash, facial/tongue/throat swelling, SOB or lightheadedness with hypotension: No Has patient  had a PCN reaction causing severe rash involving mucus membranes or skin necrosis: No Has patient had a PCN reaction that required hospitalization No Has patient had a PCN reaction occurring within the last 10 years: No If all of the above answers are "NO", then may proceed with Cephalosporin use.  Marland Kitchen Hctz [Hydrochlorothiazide] Other (See Comments)    Reaction: Unknown   . Naproxen Other (See Comments)    Reaction:  GI bleeding   . Penicillins  Hives and Other (See Comments)    Has patient had a PCN reaction causing immediate rash, facial/tongue/throat swelling, SOB or lightheadedness with hypotension: No Has patient had a PCN reaction causing severe rash involving mucus membranes or skin necrosis: No Has patient had a PCN reaction that required hospitalization No Has patient had a PCN reaction occurring within the last 10 years: No If all of the above answers are "NO", then may proceed with Cephalosporin use.    Review of Systems  Constitutional: Positive for weight loss and malaise/fatigue. Negative for fever and chills.  HENT: Negative for congestion.   Eyes: Positive for blurred vision. Negative for double vision.  Respiratory: Positive for cough, sputum production and shortness of breath. Negative for wheezing.   Cardiovascular: Positive for leg swelling. Negative for chest pain, palpitations, orthopnea and PND.  Gastrointestinal: Positive for nausea, vomiting, abdominal pain and constipation. Negative for diarrhea, blood in stool and melena.  Genitourinary: Negative for dysuria, urgency, frequency and hematuria.  Musculoskeletal: Positive for myalgias. Negative for falls.  Skin: Negative for rash.  Neurological: Positive for dizziness and weakness.  Psychiatric/Behavioral: Negative for depression and memory loss. The patient is not nervous/anxious.     MEDICATIONS AT HOME:  Prior to Admission medications   Medication Sig Start Date End Date Taking? Authorizing Provider  acetaminophen (TYLENOL) 325 MG tablet Take 650 mg by mouth every 4 (four) hours as needed for mild pain, fever or headache.   Yes Historical Provider, MD  allopurinol (ZYLOPRIM) 100 MG tablet Take 1 tablet (100 mg total) by mouth 2 (two) times daily. 11/30/14  Yes Daniel J Angiulli, PA-C  benzonatate (TESSALON) 100 MG capsule Take 100 mg by mouth 3 (three) times daily as needed for cough.   Yes Historical Provider, MD  brimonidine-timolol (COMBIGAN) 0.2-0.5  % ophthalmic solution Place 1 drop into both eyes 2 (two) times daily.   Yes Historical Provider, MD  Carboxymethylcellul-Glycerin (OPTIVE) 0.5-0.9 % SOLN Apply 1 drop to eye 4 (four) times daily.   Yes Historical Provider, MD  cholecalciferol (VITAMIN D) 1000 UNITS tablet Take 1,000 Units by mouth daily.   Yes Historical Provider, MD  cholestyramine Lucrezia Starch) 4 G packet Take 4 g by mouth 3 (three) times daily.   Yes Historical Provider, MD  colchicine 0.6 MG tablet Take 0.6 mg by mouth daily as needed (for gout flares).    Yes Historical Provider, MD  ferrous sulfate 325 (65 FE) MG tablet Take 1 tablet (325 mg total) by mouth 3 (three) times daily with meals. 11/30/14  Yes Daniel J Angiulli, PA-C  furosemide (LASIX) 40 MG tablet Take 1 tablet (40 mg total) by mouth daily. 11/30/14  Yes Daniel J Angiulli, PA-C  guaifenesin (ROBITUSSIN) 100 MG/5ML syrup Take 200 mg by mouth 3 (three) times daily as needed for cough.   Yes Historical Provider, MD  HYDROcodone-acetaminophen (NORCO/VICODIN) 5-325 MG tablet Take 1 tablet by mouth every 6 (six) hours as needed for moderate pain.   Yes Historical Provider, MD  hydrOXYzine (ATARAX/VISTARIL) 25  MG tablet Take 12.5-25 mg by mouth 3 (three) times daily as needed for itching.   Yes Historical Provider, MD  insulin aspart (NOVOLOG) 100 UNIT/ML injection Inject 10 Units into the skin 3 (three) times daily with meals. 08/29/15  Yes Epifanio Lesches, MD  insulin glargine (LANTUS) 100 UNIT/ML injection Inject 15 Units into the skin at bedtime.   Yes Historical Provider, MD  lactulose (CHRONULAC) 10 GM/15ML solution Take 45 mLs (30 g total) by mouth 2 (two) times daily. 11/30/14  Yes Daniel J Angiulli, PA-C  omeprazole (PRILOSEC) 20 MG capsule Take 1 capsule (20 mg total) by mouth 2 (two) times daily before a meal. 11/30/14  Yes Daniel J Angiulli, PA-C  ondansetron (ZOFRAN ODT) 4 MG disintegrating tablet Take 1 tablet (4 mg total) by mouth every 8 (eight) hours as  needed for nausea or vomiting. 10/25/15  Yes Evlyn Kanner, NP  rifaximin (XIFAXAN) 550 MG TABS tablet Take 1 tablet (550 mg total) by mouth 2 (two) times daily. 11/30/14  Yes Daniel J Angiulli, PA-C  sertraline (ZOLOFT) 50 MG tablet Take 50 mg by mouth daily.   Yes Historical Provider, MD  spironolactone (ALDACTONE) 25 MG tablet Take 1 tablet (25 mg total) by mouth 2 (two) times daily. 09/22/15  Yes Theodoro Grist, MD      PHYSICAL EXAMINATION:   VITAL SIGNS: Blood pressure 99/43, pulse 114, temperature 97.8 F (36.6 C), temperature source Oral, resp. rate 11, height 5\' 4"  (1.626 m), weight 78.5 kg (173 lb 1 oz), SpO2 96 %.  GENERAL:  69 y.o.-year-old pale and jaundiced patient lying in the bed in mild distress.  EYES: Pupils equal, round, reactive to light and accommodation. Scleral icterus. Extraocular muscles intact.  HEENT: Head atraumatic, normocephalic. Oropharynx and nasopharynx clear, dry oral mucosa.  NECK:  Supple, no jugular venous distention. No thyroid enlargement, no tenderness.  LUNGS: Normal breath sounds bilaterally, no wheezing, rales,rhonchi or crepitation. No use of accessory muscles of respiration.  CARDIOVASCULAR: S1, S2 normal. No murmurs, rubs, or gallops. Tachycardic ABDOMEN: Soft, mildly tender diffusely but no rebound or guarding was noted on palpation, nondistended, fluid wave was felt on palpation. Bowel sounds present. Hepatomegaly was noted , no obvious masses palpated.  EXTREMITIES: 3+ lower extremity and pedal edema, but no cyanosis, or clubbing.  NEUROLOGIC: Cranial nerves II through XII are intact. Muscle strength 5/5 in all extremities. Sensation intact. Gait not checked.  PSYCHIATRIC: The patient is alert and oriented x 3.  SKIN: No obvious rash, lesion, or ulcer.   LABORATORY PANEL:   CBC  Recent Labs Lab 12/02/15 1336  WBC 6.9  HGB 6.7*  HCT 20.0*  PLT 66*  MCV 109.8*  MCH 36.5*  MCHC 33.3  RDW 28.0*  LYMPHSABS 0.6*  MONOABS 0.3  EOSABS  0.1  BASOSABS 0.0   ------------------------------------------------------------------------------------------------------------------  Chemistries   Recent Labs Lab 12/02/15 1336  NA 134*  K 5.4*  CL 104  CO2 16*  GLUCOSE 160*  BUN 61*  CREATININE 2.66*  CALCIUM 9.3  AST 77*  ALT 32  ALKPHOS 256*  BILITOT 8.9*   ------------------------------------------------------------------------------------------------------------------  Cardiac Enzymes No results for input(s): TROPONINI in the last 168 hours. ------------------------------------------------------------------------------------------------------------------  RADIOLOGY: US Paracentesis  12/02/2015  CLINICAL DATA:  Ascites and history of cirrhosis and underlying liver mass EXAM: ULTRASOUND GUIDED PARACENTESIS PROCEDURE: An ultrasound guided paracentesis was thoroughly discussed with the patient and questions answered. The benefits, risks, alternatives and complications were also discussed. The patient understands and wishes to proceed  with the procedure. Written consent was obtained. Ultrasound was performed to localize and mark an adequate pocket of fluid in the right upper quadrant of the abdomen. The area was then prepped and draped in the normal sterile fashion. 1% Lidocaine was used for local anesthesia. Under ultrasound guidance, a 6 French paracentesis catheter was introduced. Paracentesis was performed. The catheter was removed and a dressing applied. COMPLICATIONS: None immediate FINDINGS: A total of approximately 5.9 L of bloody fluid was removed. A fluid sample wassent for laboratory analysis. IMPRESSION: Successful ultrasound guided paracentesis yielding 5.9 L of bloody ascites. Electronically Signed   By: Inez Catalina M.D.   On: 12/02/2015 16:47    EKG: Orders placed or performed during the hospital encounter of 12/02/15  . ED EKG  . ED EKG    IMPRESSION AND PLAN:  Active Problems:   Acute renal failure  (ARF) ([place]) 1. Acute on chronic renal failure due to due to intravascular volume depletion, initiate patient on IV fluids, albumin, transfuse patient perspective the cells, follow kidney function tomorrow morning, get urinalysis to rule out urinary tract infection, bladder scan 2. Anemia, likely multifactorial due to prior GI bleed as well as anemia of chronic disease, transfuse patient with packed red blood cells , discussed with family extensively risks as well as benefits of transfusion 3. Hyperkalemia due to acute on chronic renal failure, give patient 1 dose of Kayexalate: Follow potassium level later in the day 4 hyponatremia due to intravascular volume depletion, continue patient on normal saline infusion following sodium level in the morning 5 thrombocytopenia, seems to be stable. 6, coagulopathy, due to liver disease, no intervention unless patient has bleeding    All the records are reviewed and case discussed with ED provider. Management plans discussed with the patient, family and they are in agreement.  CODE STATUS:    TOTAL CRITICAL CARE TIME TAKING CARE OF THIS PATIENT: 60 minutes.    Theodoro Grist M.D on 12/02/2015 at 5:06 PM  Between 7am to 6pm - Pager - [phone] After 6pm go to [email or web address] - password [email or web address]  Office  [phone]  CC: Primary care physician; [facility]

## 2015-12-02 NOTE — ED Notes (Signed)
Pt to ed via acems from home with abd swelling, n/v. Pt with hx of liver ca and ascites. Last had fluid drawn from abd six mths ago. Pt a/ox3 upon arrival to ed, noted abd distention and bilateral lower ext swelling.

## 2015-12-02 NOTE — ED Notes (Signed)
Pt had small black stool.

## 2015-12-02 NOTE — ED Notes (Signed)
Patient returned from US.

## 2015-12-02 NOTE — Procedures (Signed)
US paracentesis  Complications:  None  Blood Loss: none  See dictation in canopy pacs  

## 2015-12-02 NOTE — ED Notes (Signed)
Bladder scan performed showing 163ml.

## 2015-12-02 NOTE — ED Notes (Signed)
Patient transported to US 

## 2015-12-02 NOTE — ED Notes (Signed)
Patient to procedures via stretcher and staff. Medicated for nausea and pain. Tolerated well.

## 2015-12-03 DIAGNOSIS — E43 Unspecified severe protein-calorie malnutrition: Secondary | ICD-10-CM | POA: Insufficient documentation

## 2015-12-03 DIAGNOSIS — L8990 Pressure ulcer of unspecified site, unspecified stage: Secondary | ICD-10-CM | POA: Insufficient documentation

## 2015-12-03 LAB — CBC
HCT: 19.8 % — ABNORMAL LOW (ref 40.0–52.0)
Hemoglobin: 6.7 g/dL — ABNORMAL LOW (ref 13.0–18.0)
MCH: 34.6 pg — ABNORMAL HIGH (ref 26.0–34.0)
MCHC: 34 g/dL (ref 32.0–36.0)
MCV: 101.9 fL — ABNORMAL HIGH (ref 80.0–100.0)
PLATELETS: 40 10*3/uL — AB (ref 150–440)
RBC: 1.94 MIL/uL — ABNORMAL LOW (ref 4.40–5.90)
RDW: 30.6 % — AB (ref 11.5–14.5)
WBC: 5.8 10*3/uL (ref 3.8–10.6)

## 2015-12-03 LAB — GLUCOSE, CAPILLARY
GLUCOSE-CAPILLARY: 260 mg/dL — AB (ref 65–99)
GLUCOSE-CAPILLARY: 233 mg/dL — AB (ref 65–99)
Glucose-Capillary: 340 mg/dL — ABNORMAL HIGH (ref 65–99)

## 2015-12-03 LAB — HEMOGLOBIN A1C: HEMOGLOBIN A1C: UNDETERMINED % (ref 4.0–6.0)

## 2015-12-03 LAB — BASIC METABOLIC PANEL
Anion gap: 9 (ref 5–15)
BUN: 63 mg/dL — AB (ref 6–20)
CALCIUM: 8.5 mg/dL — AB (ref 8.9–10.3)
CO2: 18 mmol/L — ABNORMAL LOW (ref 22–32)
CREATININE: 2.73 mg/dL — AB (ref 0.61–1.24)
Chloride: 106 mmol/L (ref 101–111)
GFR calc Af Amer: 26 mL/min — ABNORMAL LOW (ref >60)
GFR, EST NON AFRICAN AMERICAN: 22 mL/min — AB (ref >60)
Glucose, Bld: 248 mg/dL — ABNORMAL HIGH (ref 65–99)
Potassium: 4.3 mmol/L (ref 3.5–5.1)
SODIUM: 133 mmol/L — AB (ref 135–145)

## 2015-12-03 LAB — PREPARE RBC (CROSSMATCH)

## 2015-12-03 MED ORDER — HEPARIN SOD (PORK) LOCK FLUSH 100 UNIT/ML IV SOLN: 500.0000 [IU] | Freq: Every day | INTRAVENOUS | Status: DC | PRN

## 2015-12-03 MED ORDER — INSULIN ASPART 100 UNIT/ML ~~LOC~~ SOLN
0.0000 [IU] | Freq: Three times a day (TID) | SUBCUTANEOUS | Status: DC
  Administered 2015-12-03: 7 [IU] via SUBCUTANEOUS
  Administered 2015-12-03: 18:00:00 5 [IU] via SUBCUTANEOUS
  Administered 2015-12-04: 17:00:00 7 [IU] via SUBCUTANEOUS
  Administered 2015-12-04: 08:00:00 2 [IU] via SUBCUTANEOUS
  Administered 2015-12-04: 12:00:00 5 [IU] via SUBCUTANEOUS
  Administered 2015-12-05: 3 [IU] via SUBCUTANEOUS
  Filled 2015-12-03: qty 3
  Filled 2015-12-03 (×2): qty 7
  Filled 2015-12-03: qty 2
  Filled 2015-12-03: qty 5
  Filled 2015-12-03: qty 1

## 2015-12-03 MED ORDER — SODIUM CHLORIDE 0.9 % IJ SOLN: 3.0000 mL | INTRAMUSCULAR | Status: DC | PRN

## 2015-12-03 MED ORDER — SODIUM CHLORIDE 0.9 % IJ SOLN: 10.0000 mL | INTRAMUSCULAR | Status: DC | PRN

## 2015-12-03 MED ORDER — INSULIN GLARGINE 100 UNIT/ML ~~LOC~~ SOLN
7.0000 [IU] | Freq: Every day | SUBCUTANEOUS | Status: DC
  Administered 2015-12-03 – 2015-12-05 (×3): 7 [IU] via SUBCUTANEOUS
  Filled 2015-12-03 (×3): qty 0.07

## 2015-12-03 MED ORDER — BOOST / RESOURCE BREEZE PO LIQD
1.0000 | Freq: Two times a day (BID) | ORAL | Status: DC
  Administered 2015-12-03 – 2015-12-05 (×4): 1 via ORAL

## 2015-12-03 MED ORDER — SODIUM CHLORIDE 0.9 % IV SOLN: 250.0000 mL | Freq: Once | INTRAVENOUS | Status: DC

## 2015-12-03 MED ORDER — INSULIN ASPART 100 UNIT/ML ~~LOC~~ SOLN
0.0000 [IU] | Freq: Every day | SUBCUTANEOUS | Status: DC
  Administered 2015-12-03 – 2015-12-04 (×2): 2 [IU] via SUBCUTANEOUS
  Filled 2015-12-03 (×2): qty 2

## 2015-12-03 MED ORDER — HEPARIN SOD (PORK) LOCK FLUSH 100 UNIT/ML IV SOLN: 250.0000 [IU] | INTRAVENOUS | Status: DC | PRN

## 2015-12-03 MED ORDER — SODIUM CHLORIDE 0.9 % IV SOLN
Freq: Once | INTRAVENOUS | Status: AC
  Administered 2015-12-03: 15:00:00 via INTRAVENOUS

## 2015-12-03 NOTE — Consult Note (Signed)
GI Inpatient Consult Note  Reason for Consult: Anemia with questionable SBP   Attending Requesting Consult: Bridgett Larsson  History of Present Illness: Luke BUCKLE Sr. is a 69 y.o. male with a history of [place] secondary to cirrhosis d/t NASH (currently on hospice), h/o stroke, HTN, COPD, DM, and OSA a/w ARF.   Patient states he began experiencing increased weakness, abdominal and LE swelling, nausea, and vomiting since Sunday.  Symptoms progressively worsened over this time until he began feeling very weak and was unable to keep fluids down.  He also notes increased constipation and abdominal discomfort, which was relieved this morning after taking multiple doses of lactulose over the last few days.  He denies fevers, chills, worsening abdominal pain, hematochezia, and melena.  Upon arrival in the ED, significant labs included Hgb 6.7, platelets 66, K 5.4, alk phos 256, ALT 77, and albumin 2.9.  Worsening renal failure also noted.  A US-guided paracentesis removed 5.9 L fluid; cell count demonstrated WBCs 201, neutrophils 79%.  He was also transfused two units PRBCs, but Hgb only 6.0 on recheck.  FOBT cards pending.  Patient was started on IV albumin and admitted for further evaluation and management.  [name] reports he is feeling much better since receiving fluids, transfusions, and paracentesis.  He denies further abdominal discomfort and was able to tolerate po liquids and snacks.  His BMs are now loose and watery, but he notes taking at least 2 doses of lactulose yesterday and the day before; prior to that, he was feeling very constipated.  He feels like his energy is improving as well, noting two falls over the last 6 weeks that have concerned him.  Patient also notes two episodes of feeling like it was difficult to swallow, occuring about four weeks apart.  He states he could not swallow solids or liquids, but he did not cough, coke, or regurgitate.  Both times it resolved the next time he tried to  eat.  He denies frank blood or melena.   Past Medical History:  Past Medical History  Diagnosis Date  . Stroke ([place])   . Hypertension   . COPD (chronic obstructive pulmonary disease) ([place])   . Diabetes mellitus without complication ([place])   . H/O hiatal hernia   . Depression   . Arthritis   . Hepatitis     NASH  . Cirrhosis of liver not due to alcohol ([place])   . Obstructive sleep apnea 11/20/2014  . Hyperlipidemia   . Hiatal hernia   . GI bleeds, multiple during admission   . Restless leg syndrome   . Cancer [facility])     liver    Problem List: Patient Active Problem List   Diagnosis Date Noted  . Protein-calorie malnutrition, severe 12/03/2015  . Pressure ulcer 12/03/2015  . Acute renal failure (ARF) ([place]) 12/02/2015  . Arteriosclerosis of coronary artery 11/22/2015  . Hepatic cirrhosis ([place]) 11/22/2015  . H/O transient cerebral ischemia 11/22/2015  . Apnea, sleep 11/22/2015  . Lumbar canal stenosis 11/22/2015  . Hepatocellular carcinoma ([place]) 09/22/2015  . Ascites 09/21/2015  . Jaundice 09/21/2015  . Liver cirrhosis ([place]) 09/21/2015  . Posthemorrhagic anemia   . GI bleed 09/19/2015  . Anemia with chronic illness 08/22/2015  . Coagulation disorder ([place]) 08/22/2015  . 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 Pancytopenia ([place]) 08/09/2015  . SDH (subdural hematoma) ([place]) 08/09/2015  . Gout 08/09/2015  . Hypertension   . Cirrhosis of liver not due to alcohol ([place])   .  Hyperlipidemia   . Iron deficiency anemia   . Gastric polyp   . Upper GI bleed 07/19/2015  . Chronic depression 04/05/2015  . UTI (lower urinary tract infection) 11/23/2014  . Thrombocytopenia ([place]) 11/23/2014  . Anemia 11/23/2014  . NASH (nonalcoholic steatohepatitis) 11/21/2014  . Stenosis of cervical spine region 11/20/2014  . Type 2 diabetes mellitus, uncontrolled, with retinopathy ([place]) 11/20/2014  . History of COPD 11/20/2014  . Diabetes mellitus with diabetic retinopathy   . Acute  respiratory acidosis   . Encounter for intubation   . Encounter for orogastric (OG) tube placement   . Surgery, other elective   . Cervical spondylosis with myelopathy 11/13/2014  . Cervical spinal stenosis 10/11/2014  . Acute respiratory failure ([place]) 05/21/2014  . Acquired cyst of kidney 10/03/2012  . Type 2 diabetes mellitus ([place]) 03/26/2004    Past Surgical History: Past Surgical History  Procedure Laterality Date  . Nose surgery    . Appendectomy    . Cardiac catheterization    . Eye surgery      leaglly blind  . Anterior cervical decompression/discectomy fusion 4 levels N/A 11/13/2014    Procedure:  Cervical three-four, Cervical four-five, Cervical five-six, Cervical six- seven anterior cervical decompression with fusion and interbody prosthesis plating and bonegraft;  Surgeon: Consuella Lose, MD;  Location: [facility] NEURO ORS;  Service: Neurosurgery;  Laterality: N/A;  . Esophagogastroduodenoscopy N/A 08/23/2015    Procedure: ESOPHAGOGASTRODUODENOSCOPY (EGD);  Surgeon: Manya Silvas, MD;  Location: [facility] ENDOSCOPY;  Service: Endoscopy;  Laterality: N/A;  . Esophagogastroduodenoscopy (egd) with propofol N/A 07/20/2015    Procedure: ESOPHAGOGASTRODUODENOSCOPY (EGD) WITH PROPOFOL;  Surgeon: Lucilla Lame, MD;  Location: [facility] ENDOSCOPY;  Service: Endoscopy;  Laterality: N/A;    Allergies: Allergies  Allergen Reactions  . Actos [Pioglitazone] Anaphylaxis  . Aminophylline Other (See Comments)    Reaction: Unknown  . Amitriptyline Other (See Comments)    Reaction: Unknown   . Ampicillin Hives and Other (See Comments)    Has patient had a PCN reaction causing immediate rash, facial/tongue/throat swelling, SOB or lightheadedness with hypotension: No Has patient had a PCN reaction causing severe rash involving mucus membranes or skin necrosis: No Has patient had a PCN reaction that required hospitalization No Has patient had a PCN reaction occurring within the last 10 years: No If all of  the above answers are "NO", then may proceed with Cephalosporin use.  Marland Kitchen Hctz [Hydrochlorothiazide] Other (See Comments)    Reaction: Unknown   . Naproxen Other (See Comments)    Reaction:  GI bleeding   . Penicillins Hives and Other (See Comments)    Has patient had a PCN reaction causing immediate rash, facial/tongue/throat swelling, SOB or lightheadedness with hypotension: No Has patient had a PCN reaction causing severe rash involving mucus membranes or skin necrosis: No Has patient had a PCN reaction that required hospitalization No Has patient had a PCN reaction occurring within the last 10 years: No If all of the above answers are "NO", then may proceed with Cephalosporin use.    Home Medications: Prescriptions prior to admission  Medication Sig Dispense Refill Last Dose  . acetaminophen (TYLENOL) 325 MG tablet Take 650 mg by mouth every 4 (four) hours as needed for mild pain, fever or headache.   Past Week at Unknown time  . allopurinol (ZYLOPRIM) 100 MG tablet Take 1 tablet (100 mg total) by mouth 2 (two) times daily. 60 tablet 1 Past Week at Unknown time  . benzonatate (TESSALON) 100 MG capsule  Take 100 mg by mouth 3 (three) times daily as needed for cough.   Past Week at Unknown time  . brimonidine-timolol (COMBIGAN) 0.2-0.5 % ophthalmic solution Place 1 drop into both eyes 2 (two) times daily.   Past Week at Unknown time  . Carboxymethylcellul-Glycerin (OPTIVE) 0.5-0.9 % SOLN Apply 1 drop to eye 4 (four) times daily.   Past Week at Unknown time  . cholecalciferol (VITAMIN D) 1000 UNITS tablet Take 1,000 Units by mouth daily.   Past Week at Unknown time  . cholestyramine (QUESTRAN) 4 G packet Take 4 g by mouth 3 (three) times daily.   Past Week at Unknown time  . colchicine 0.6 MG tablet Take 0.6 mg by mouth daily as needed (for gout flares).    Past Week at Unknown time  . ferrous sulfate 325 (65 FE) MG tablet Take 1 tablet (325 mg total) by mouth 3 (three) times daily with meals.  90 tablet 3 Past Week at Unknown time  . furosemide (LASIX) 40 MG tablet Take 1 tablet (40 mg total) by mouth daily. 30 tablet 1 Past Week at Unknown time  . guaifenesin (ROBITUSSIN) 100 MG/5ML syrup Take 200 mg by mouth 3 (three) times daily as needed for cough.   Past Week at Unknown time  . HYDROcodone-acetaminophen (NORCO/VICODIN) 5-325 MG tablet Take 1 tablet by mouth every 6 (six) hours as needed for moderate pain.   Past Week at Unknown time  . hydrOXYzine (ATARAX/VISTARIL) 25 MG tablet Take 12.5-25 mg by mouth 3 (three) times daily as needed for itching.   Past Week at Unknown time  . insulin aspart (NOVOLOG) 100 UNIT/ML injection Inject 10 Units into the skin 3 (three) times daily with meals. 10 mL 11 Past Week at Unknown time  . insulin glargine (LANTUS) 100 UNIT/ML injection Inject 15 Units into the skin at bedtime.   Past Week at Unknown time  . lactulose (CHRONULAC) 10 GM/15ML solution Take 45 mLs (30 g total) by mouth 2 (two) times daily. 240 mL 0 Past Week at Unknown time  . omeprazole (PRILOSEC) 20 MG capsule Take 1 capsule (20 mg total) by mouth 2 (two) times daily before a meal. 60 capsule 1 Past Week at Unknown time  . ondansetron (ZOFRAN ODT) 4 MG disintegrating tablet Take 1 tablet (4 mg total) by mouth every 8 (eight) hours as needed for nausea or vomiting. 60 tablet 2 Past Week at Unknown time  . rifaximin (XIFAXAN) 550 MG TABS tablet Take 1 tablet (550 mg total) by mouth 2 (two) times daily. 60 tablet 1 Past Week at Unknown time  . sertraline (ZOLOFT) 50 MG tablet Take 50 mg by mouth daily.   Past Week at Unknown time  . spironolactone (ALDACTONE) 25 MG tablet Take 1 tablet (25 mg total) by mouth 2 (two) times daily. 60 tablet 3 Past Week at Unknown time   Home medication reconciliation was completed with the patient.   Scheduled Inpatient Medications:   . sodium chloride   Intravenous Once  . albumin human  12.5 g Intravenous Q6H  . allopurinol  100 mg Oral BID  .  brimonidine  1 drop Both Eyes BID   And  . timolol  1 drop Both Eyes BID  . cholestyramine  4 g Oral TID  . feeding supplement  1 Container Oral BID BM  . ferrous sulfate  325 mg Oral TID WC  . insulin aspart  0-5 Units Subcutaneous QHS  . insulin aspart  0-9 Units  Subcutaneous TID WC  . insulin glargine  7 Units Subcutaneous Daily  . lactulose  30 g Oral BID  . pantoprazole  40 mg Oral Daily  . polyvinyl alcohol  1 drop Both Eyes QID  . rifaximin  550 mg Oral BID  . sertraline  50 mg Oral Daily    Continuous Inpatient Infusions:   . sodium chloride 75 mL/hr at 12/03/15 1131    PRN Inpatient Medications:  benzonatate, guaifenesin, HYDROcodone-acetaminophen, hydrOXYzine, ondansetron **OR** ondansetron (ZOFRAN) IV  Family History: family history includes Diabetes Mellitus II in his sister; Kidney failure in his sister.   Social History:   reports that he has never smoked. He has never used smokeless tobacco. He reports that he does not drink alcohol or use illicit drugs.   Review of Systems: Constitutional: + weight loss Eyes: No changes in vision. ENT: No oral lesions, sore throat.  GI: see HPI.  Heme/Lymph: No easy bruising.  CV: No chest pain.  GU: No hematuria.  Integumentary: No rashes.  Neuro: No headaches.  Psych: No depression/anxiety.  Endocrine: No heat/cold intolerance.  Allergic/Immunologic: No urticaria.  Resp: + cough, SOB.  Musculoskeletal: No joint swelling. + LE weakness, myalgias   Physical Examination: BP 95/46 mmHg  Pulse 85  Temp(Src) 97.8 F (36.6 C) (Oral)  Resp 20  Ht 5' 4"  (1.626 m)  Wt 71.94 kg (158 lb 9.6 oz)  BMI 27.21 kg/m2  SpO2 98% Gen: NAD, alert and oriented x 4, lying in bed comfortably, appearing frail and jaundiced HEENT: PEERLA, EOMI, Neck: supple, no JVD or thyromegaly Chest: CTA bilaterally, no wheezes, crackles, or other adventitious sounds CV: RRR, no m/g/c/r Abd: soft, NT, ND, +BS in all four quadrants; + fluid wave,  hepatomegaly noted, guarding, ridigity, or rebound tenderness Ext: no edema, well perfused with 2+ pulses Skin: no rash or lesions noted Lymph: no LAD  Data: Lab Results  Component Value Date   WBC 5.8 12/03/2015   HGB 6.7* 12/03/2015   HCT 19.8* 12/03/2015   MCV 101.9* 12/03/2015   PLT 40* 12/03/2015    Recent Labs Lab 12/02/15 1336 12/02/15 1912 12/03/15 0456  HGB 6.7* 6.0* 6.7*   Lab Results  Component Value Date   NA 133* 12/03/2015   K 4.3 12/03/2015   CL 106 12/03/2015   CO2 18* 12/03/2015   BUN 63* 12/03/2015   CREATININE 2.73* 12/03/2015   Lab Results  Component Value Date   ALT 32 12/02/2015   AST 77* 12/02/2015   ALKPHOS 256* 12/02/2015   BILITOT 8.9* 12/02/2015    Recent Labs Lab 12/02/15 1912  INR 2.55   Assessment/Plan: Mr. Geiman is a 69 y.o. male with a history of [place] secondary to cirrhosis d/t NASH (currently on hospice), h/o stroke, HTN, COPD, DM, and OSA a/w ARF.  Labs show persist anemia with Hgb 6.0 despite transfusions, but patient is receiving another until during my visit.  Cell count from paracentesis is c/w SBP, so treatment with Ceftriaxone may be considered if aggressive treatment with IV albumin is continued.  However, the risks and benefits of continuing treatment while patient is receiving hospice should be discussed with the patient and his family.  Patient is a poor candidate for endoscopy given his multiple advanced comorbidities, and anemia is likely related to chronic disease as well. EGD not planned at this time.  Will continue to follow labs and patient's progress, making further recommendations prn.  Recommendations: - Monitor Hgb, transfuse if <7 - If treating ascites/liver  disease aggreesively, may consider Ceftriaxone for SBP - Patient poor candidate for endoscopy, no EGD planned  Thank you for the consult. We will follow along with you. Please call with questions or concerns.  Lavera Guise, PA-C [facility]  Gastroenterology Phone: [phone] Pager: [phone]

## 2015-12-03 NOTE — Progress Notes (Signed)
Visit made. Patient is followed by [facility] with a hospice diagnosis of Liver Ca. He is a DNR code at home and has an out of facility DNR in place. Of note patient is a Full Code in the EPIC system this admission. Patient seen lying in bed, color jaundice/gray, eyes closed, easily awakened to voice. Patient able to interact and answer questions, oriented x 4. Per chart note review he had 5.8 liters of fluid drained from his abdomen with an ultrasound guided paracentesis on 12/12. Patient reports feeling "better", he appears weak, reports he was constipated at home, last bowel movement is charted as 12/13. He reports poor appetite at home d/t nausea with eating, he is on a clear liquid diet and was able to eat his breakfast this morning. He is currently receiving IV albumin and has received 2 units of PRBC's for an admission hemoglobin of 6.0. Hemoglobin per morning labs up to 6.7 today.  Patient's daughter [name] and wife [name] had left just prior to writer's visit. Will continue to follow and update hospice team. Flo Shanks RN, BSN, [place] [facility] [place], [facility] [phone] c ]

## 2015-12-03 NOTE — Progress Notes (Signed)
PT Cancellation Note  Patient Details Name: Luke MADDEN Sr. MRN: [id number] DOB: 1945-12-24   Cancelled Treatment:    Reason Eval/Treat Not Completed: Other (comment). Pt with low Hgb 6.7 with plans for transfusion this date. Per RN, pt would be more appropriate next date for evaluation secondary to medical condition. Will re-attempt next date.   Maurisio Ruddy 12/03/2015, 2:56 PM  Greggory Stallion, PT, DPT [phone]

## 2015-12-03 NOTE — Progress Notes (Signed)
   12/03/15 0900  Clinical Encounter Type  Visited With Patient  Visit Type Initial;Spiritual support  Referral From Nurse  Consult/Referral To Chaplain  Spiritual Encounters  Spiritual Needs Prayer  Provided pastoral presence, support and prayer to patient.  Pt and I sang "Jesus Loves Me" per pt request.  Pt thanked me for my visit.  [place] [phone]

## 2015-12-03 NOTE — Progress Notes (Signed)
Inpatient Diabetes Program Recommendations  AACE/ADA: New Consensus Statement on Inpatient Glycemic Control (2015)  Target Ranges:  Prepandial:   less than 140 mg/dL      Peak postprandial:   less than 180 mg/dL (1-2 hours)      Critically ill patients:  140 - 180 mg/dL  Results for [name], [name] (MRN [id number]) as of 12/03/2015 09:47  Ref. Range 12/03/2015 04:56  Glucose Latest Ref Range: 65-99 mg/dL 248 (H)   Results for [name], [name] (MRN [id number]) as of 12/03/2015 09:47  Ref. Range 12/02/2015 13:36  Glucose Latest Ref Range: 65-99 mg/dL 160 (H)   Review of Glycemic Control  Diabetes history: DM2 Outpatient Diabetes medications: Lantus 15 units QHS, Novlog 10 units TID with meals Current orders for Inpatient glycemic control: None  Inpatient Diabetes Program Recommendations: Insulin - Basal: Patient takes Lantus 15 units QHS as an outpatient. May want to consider ordering at least half of home dosage of Lantus if CBGs continue to be elevated with Novolog correction. Correction (SSI): Please order CBGs with Novolog correction scale ACHS.  Thanks, Barnie Alderman, RN, MSN, CDE Diabetes Coordinator Inpatient Diabetes Program [phone] (Team Pager from [place] to [place]) [phone] (AP office) [phone] [facility] office) [phone] [facility] office)

## 2015-12-03 NOTE — Progress Notes (Signed)
Initial Nutrition Assessment  DOCUMENTATION CODES:   Severe malnutrition in context of chronic illness  INTERVENTION:   Meals and Snacks: Cater to patient preferences Medical Food Supplement Therapy: will recommend Boost Breeze po BID, each supplement provides 250 kcal and 9 grams of protein and will try Sugar Free Mighty Shakes BID as well. Per daughter pt likes Glucerna but sometimes felt that it gave him diarrhea.  Coordination of Care: recommend daily weights   NUTRITION DIAGNOSIS:   Malnutrition related to chronic illness, acute illness as evidenced by energy intake < or equal to 50% for > or equal to 5 days, severe depletion of muscle mass, moderate depletion of body fat, percent weight loss.  GOAL:   Patient will meet greater than or equal to 90% of their needs  MONITOR:    (Energy Intake, Electrolyte and renal Profile, Anthropometrics, Digestive system, glucose Profile)  REASON FOR ASSESSMENT:   Diagnosis    ASSESSMENT:   Pt admitted with abdominal pain and severe ascites. Per MD note 6L of fluid removed via paracentesis in ED. Pt also admitted with acute renal failure. Pt with h/o hepatocellular liver cancer secondary to decompensating cirrhosis per MD note.  Past Medical History  Diagnosis Date  . Stroke ([place])   . Hypertension   . COPD (chronic obstructive pulmonary disease) ([place])   . Diabetes mellitus without complication ([place])   . H/O hiatal hernia   . Depression   . Arthritis   . Hepatitis     NASH  . Cirrhosis of liver not due to alcohol ([place])   . Obstructive sleep apnea 11/20/2014  . Hyperlipidemia   . Hiatal hernia   . GI bleeds, multiple during admission   . Restless leg syndrome   . Cancer [facility])     liver    Diet Order:  Diet heart healthy/carb modified Room service appropriate?: Yes; Fluid consistency:: Thin    Current Nutrition: Pt reports tolerating 100% of chicken broth this am on CL tray  Food/Nutrition-Related History: Pt reports he  has not eaten in a week PTA except for roughly one Glucerna daily secondary to abdominal pain and distension.    Scheduled Medications:  . sodium chloride   Intravenous Once  . albumin human  12.5 g Intravenous Q6H  . allopurinol  100 mg Oral BID  . brimonidine  1 drop Both Eyes BID   And  . timolol  1 drop Both Eyes BID  . cholestyramine  4 g Oral TID  . feeding supplement  1 Container Oral BID BM  . ferrous sulfate  325 mg Oral TID WC  . insulin aspart  0-5 Units Subcutaneous QHS  . insulin aspart  0-9 Units Subcutaneous TID WC  . insulin glargine  7 Units Subcutaneous Daily  . lactulose  30 g Oral BID  . pantoprazole  40 mg Oral Daily  . polyvinyl alcohol  1 drop Both Eyes QID  . rifaximin  550 mg Oral BID  . sertraline  50 mg Oral Daily    Continuous Medications:  . sodium chloride 75 mL/hr at 12/03/15 1131    Electrolyte/Renal Profile and Glucose Profile:   Recent Labs Lab 12/02/15 1336 12/02/15 1912 12/03/15 0456  NA 134*  --  133*  K 5.4*  --  4.3  CL 104  --  106  CO2 16*  --  18*  BUN 61*  --  63*  CREATININE 2.66* 2.61* 2.73*  CALCIUM 9.3  --  8.5*  GLUCOSE 160*  --  248*   Protein Profile:  Recent Labs Lab 12/02/15 1336  ALBUMIN 2.9*   Hepatic Function Latest Ref Rng 12/02/2015 11/22/2015 10/25/2015  Total Protein 6.5 - 8.1 g/dL 7.6 7.8 8.1  Albumin 3.5 - 5.0 g/dL 2.9(L) 2.9(L) 3.0(L)  AST 15 - 41 U/L 77(H) 79(H) 86(H)  ALT 17 - 63 U/L 32 32 35  Alk Phosphatase 38 - 126 U/L 256(H) 278(H) 232(H)  Total Bilirubin 0.3 - 1.2 mg/dL 8.9(H) 6.2(H) 5.6(H)  Bilirubin, Direct 0.1 - 0.5 mg/dL - - 2.9(H)    Nutritional Anemia Profile:  CBC Latest Ref Rng 12/03/2015 12/02/2015 12/02/2015  WBC 3.8 - 10.6 K/uL 5.8 6.2 6.9  Hemoglobin 13.0 - 18.0 g/dL 6.7(L) 6.0(L) 6.7(L)  Hematocrit 40.0 - 52.0 % 19.8(L) 17.8(L) 20.0(L)  Platelets 150 - 440 K/uL 40(L) 53(L) 66(L)   Other: Ammonia 63   Gastrointestinal Profile: Last BM:  12/03/2015 multiple loose  BMs   Nutrition-Focused Physical Exam Findings: Nutrition-Focused physical exam completed. Findings are moderate-severe fat depletion, moderate-severe muscle depletion, and no edema, however severe abdominal ascites. RD also notes pt jaundiced.  Skin:   (Stage II buttocks pressure ulcer)  Weight Change: Pt with weight loss since last admission in September 2016 of 11%, RD also notes 6L fluid removed in ED.   Height:   Ht Readings from Last 1 Encounters:  12/02/15 _0  (1.626 m)    Weight:   Wt Readings from Last 1 Encounters:  12/02/15 158 lb 9.6 oz (71.94 kg)    Wt Readings from Last 10 Encounters:  12/02/15 158 lb 9.6 oz (71.94 kg)  10/25/15 158 lb 11.7 oz (72 kg)  09/19/15 173 lb (78.472 kg)  09/18/15 170 lb (77.111 kg)  09/15/15 186 lb (84.369 kg)  08/23/15 178 lb 3.2 oz (80.831 kg)  08/12/15 174 lb 13.2 oz (79.3 kg)  08/08/15 174 lb (78.926 kg)  08/02/15 176 lb 12.9 oz (80.2 kg)  07/21/15 182 lb 11.2 oz (82.872 kg)    BMI:  Body mass index is 27.21 kg/(m^2).  Estimated Nutritional Needs:   Kcal:  BEE: 1394kcals, TEE: (IF 1.1-1.3)(AF 1.2) 1841-2176kcals  Protein:  79-93g protein (1.1-1.3g/kg)  Fluid:  1797-2179m of fluid (25-377mkg)  EDUCATION NEEDS:   No education needs identified at this time   HISouth EnglishRD, LDN Pager [phone]

## 2015-12-03 NOTE — Plan of Care (Signed)
Problem: Education: Goal: Knowledge of [place] [facility] Education information/materials will improve Outcome: Progressing Welcome pack introduced and reviewed with patient.  Room orientation completed.  Questions answered.  Problem: Safety: Goal: Ability to remain free from injury will improve Outcome: Progressing Pt remains free from injury.  One assist to [facility].  Calls appropriately.  Bed in lowest position, bed alarm on, call bell and phone within reach.

## 2015-12-03 NOTE — Progress Notes (Signed)
[facility] [place] NAME: [name]    MR#:  [id number]  DATE OF BIRTH:  November 15, 1946  SUBJECTIVE:  CHIEF COMPLAINT:   Chief Complaint  Patient presents with  . Abdominal Pain   better abdominal pain, nausea, poor appetite, had diarrhea due to lactulose, no melena or bloody stool.  REVIEW OF SYSTEMS:  CONSTITUTIONAL: No fever, has generalized weakness.  EYES: No blurred or double vision.  EARS, NOSE, AND THROAT: No tinnitus or ear pain.  RESPIRATORY: No cough, shortness of breath, wheezing or hemoptysis.  CARDIOVASCULAR: No chest pain, orthopnea, edema.  GASTROINTESTINAL: Has nausea, no vomiting, has diarrhea and abdominal pain. No melena or bloody stool. GENITOURINARY: No dysuria, hematuria.  ENDOCRINE: No polyuria, nocturia,  HEMATOLOGY: No anemia, easy bruising or bleeding SKIN: No rash or lesion. MUSCULOSKELETAL: No joint pain or arthritis.   NEUROLOGIC: No tingling, numbness, weakness.  PSYCHIATRY: No anxiety or depression.   DRUG ALLERGIES:   Allergies  Allergen Reactions  . Actos [Pioglitazone] Anaphylaxis  . Aminophylline Other (See Comments)    Reaction: Unknown  . Amitriptyline Other (See Comments)    Reaction: Unknown   . Ampicillin Hives and Other (See Comments)    Has patient had a PCN reaction causing immediate rash, facial/tongue/throat swelling, SOB or lightheadedness with hypotension: No Has patient had a PCN reaction causing severe rash involving mucus membranes or skin necrosis: No Has patient had a PCN reaction that required hospitalization No Has patient had a PCN reaction occurring within the last 10 years: No If all of the above answers are "NO", then may proceed with Cephalosporin use.  Marland Kitchen Hctz [Hydrochlorothiazide] Other (See Comments)    Reaction: Unknown   . Naproxen Other (See Comments)    Reaction:  GI bleeding   . Penicillins Hives and Other (See Comments)    Has patient had a PCN  reaction causing immediate rash, facial/tongue/throat swelling, SOB or lightheadedness with hypotension: No Has patient had a PCN reaction causing severe rash involving mucus membranes or skin necrosis: No Has patient had a PCN reaction that required hospitalization No Has patient had a PCN reaction occurring within the last 10 years: No If all of the above answers are "NO", then may proceed with Cephalosporin use.    VITALS:  Blood pressure 106/51, pulse 109, temperature 97.6 F (36.4 C), temperature source Oral, resp. rate 18, height 5\' 4"  (1.626 m), weight 71.94 kg (158 lb 9.6 oz), SpO2 97 %.  PHYSICAL EXAMINATION:  GENERAL:  69 y.o.-year-old patient lying in the bed with no acute distress.  EYES: Pupils equal, round, reactive to light and accommodation. No scleral icterus. Extraocular muscles intact.  HEENT: Head atraumatic, normocephalic. Oropharynx and nasopharynx clear.  NECK:  Supple, no jugular venous distention. No thyroid enlargement, no tenderness.  LUNGS: Normal breath sounds bilaterally, no wheezing, rales,rhonchi or crepitation. No use of accessory muscles of respiration.  CARDIOVASCULAR: S1, S2 normal. No murmurs, rubs, or gallops.  ABDOMEN: Soft, mild tenderness, distended. Bowel sounds present. Positive ascites sign. Unable to estimate organomegaly or mass.  EXTREMITIES: No pedal edema, cyanosis, or clubbing.  NEUROLOGIC: Cranial nerves II through XII are intact. Muscle strength 5/5 in all extremities. Sensation intact. Gait not checked.  PSYCHIATRIC: The patient is alert and oriented x 3.  SKIN: No obvious rash, lesion, or ulcer.    LABORATORY PANEL:   CBC  Recent Labs Lab 12/03/15 0456  WBC 5.8  HGB 6.7*  HCT 19.8*  PLT  40*   ------------------------------------------------------------------------------------------------------------------  Chemistries   Recent Labs Lab 12/02/15 1336  12/03/15 0456  NA 134*  --  133*  K 5.4*  --  4.3  CL 104  --  106   CO2 16*  --  18*  GLUCOSE 160*  --  248*  BUN 61*  --  63*  CREATININE 2.66*  < > 2.73*  CALCIUM 9.3  --  8.5*  AST 77*  --   --   ALT 32  --   --   ALKPHOS 256*  --   --   BILITOT 8.9*  --   --   < > = values in this interval not displayed. ------------------------------------------------------------------------------------------------------------------  Cardiac Enzymes No results for input(s): TROPONINI in the last 168 hours. ------------------------------------------------------------------------------------------------------------------  RADIOLOGY:  US Paracentesis  12/02/2015  CLINICAL DATA:  Ascites and history of cirrhosis and underlying liver mass EXAM: ULTRASOUND GUIDED PARACENTESIS PROCEDURE: An ultrasound guided paracentesis was thoroughly discussed with the patient and questions answered. The benefits, risks, alternatives and complications were also discussed. The patient understands and wishes to proceed with the procedure. Written consent was obtained. Ultrasound was performed to localize and mark an adequate pocket of fluid in the right upper quadrant of the abdomen. The area was then prepped and draped in the normal sterile fashion. 1% Lidocaine was used for local anesthesia. Under ultrasound guidance, a 6 French paracentesis catheter was introduced. Paracentesis was performed. The catheter was removed and a dressing applied. COMPLICATIONS: None immediate FINDINGS: A total of approximately 5.9 L of bloody fluid was removed. A fluid sample wassent for laboratory analysis. IMPRESSION: Successful ultrasound guided paracentesis yielding 5.9 L of bloody ascites. Electronically Signed   By: Inez Catalina M.D.   On: 12/02/2015 16:47    EKG:   Orders placed or performed during the hospital encounter of 12/02/15  . ED EKG  . ED EKG    ASSESSMENT AND PLAN:   1. Acute on chronic renal failure due to due to intravascular volume depletion, worsening. continue IV fluids, follow-up  BMP.  2. Anemia, likely multifactorial due to prior GI bleed as well as anemia of chronic disease, transfused 1 unit of packed red blood cells, Hb is 6.7 today. Will give another unit of PRBC transfusion today. Follow-up hemoglobin in am. No active bleeding.  3. Hyperkalemia due to acute on chronic renal failure, give patient 1 dose of Kayexalate, improved  4. hyponatremia due to intravascular volume depletion, continue normal saline infusion and follow-up BMP.  5. thrombocytopenia, stable.  6. coagulopathy, due to liver disease, no intervention unless patient has bleeding. Follow-up INR.  * Liver cirrhosis with ascites, status post paracentesis with 6 L fluid drawn.  Ascites fluid show WBC 201 with a neutrophil 79. GI consult. Still has ascites sign, we will get ultrasound guided paracentesis tomorrow.  * Severe malnutrition in context of chronic illness. Follow-up dietitian recommendation. * Liver cancer with multiple medical problems as above, oncology and palliative care consult. Very poor prognosis.  All the records are reviewed and case discussed with Care Management/Social Workerr. Management plans discussed with the patient, his son and they are in agreement.  CODE STATUS: Full code  TOTAL TIME TAKING CARE OF THIS PATIENT: 48 minutes.  Greater than 50% time was spent on coordination of care and face-to-face counseling.  POSSIBLE D/C IN 3 DAYS, DEPENDING ON CLINICAL CONDITION.   Demetrios Loll M.D on 12/03/2015 at 2:58 PM  Between 7am to 6pm - Pager - [phone]  After 6pm go to [email or web address] - password [email or web address]  [facility]  Office  [phone]  CC: Primary care physician; No primary care provider on file.

## 2015-12-04 DIAGNOSIS — Z8673 Personal history of transient ischemic attack (TIA), and cerebral infarction without residual deficits: Secondary | ICD-10-CM

## 2015-12-04 DIAGNOSIS — J449 Chronic obstructive pulmonary disease, unspecified: Secondary | ICD-10-CM

## 2015-12-04 DIAGNOSIS — R630 Anorexia: Secondary | ICD-10-CM

## 2015-12-04 DIAGNOSIS — C228 Malignant neoplasm of liver, primary, unspecified as to type: Secondary | ICD-10-CM

## 2015-12-04 DIAGNOSIS — R531 Weakness: Secondary | ICD-10-CM

## 2015-12-04 DIAGNOSIS — K449 Diaphragmatic hernia without obstruction or gangrene: Secondary | ICD-10-CM

## 2015-12-04 DIAGNOSIS — F329 Major depressive disorder, single episode, unspecified: Secondary | ICD-10-CM

## 2015-12-04 DIAGNOSIS — E119 Type 2 diabetes mellitus without complications: Secondary | ICD-10-CM

## 2015-12-04 DIAGNOSIS — Z862 Personal history of diseases of the blood and blood-forming organs and certain disorders involving the immune mechanism: Secondary | ICD-10-CM

## 2015-12-04 DIAGNOSIS — M7989 Other specified soft tissue disorders: Secondary | ICD-10-CM

## 2015-12-04 DIAGNOSIS — K7460 Unspecified cirrhosis of liver: Secondary | ICD-10-CM

## 2015-12-04 DIAGNOSIS — R112 Nausea with vomiting, unspecified: Secondary | ICD-10-CM

## 2015-12-04 DIAGNOSIS — G2581 Restless legs syndrome: Secondary | ICD-10-CM

## 2015-12-04 DIAGNOSIS — R109 Unspecified abdominal pain: Secondary | ICD-10-CM

## 2015-12-04 DIAGNOSIS — M129 Arthropathy, unspecified: Secondary | ICD-10-CM

## 2015-12-04 DIAGNOSIS — I10 Essential (primary) hypertension: Secondary | ICD-10-CM

## 2015-12-04 DIAGNOSIS — G4730 Sleep apnea, unspecified: Secondary | ICD-10-CM

## 2015-12-04 DIAGNOSIS — R140 Abdominal distension (gaseous): Secondary | ICD-10-CM

## 2015-12-04 DIAGNOSIS — E785 Hyperlipidemia, unspecified: Secondary | ICD-10-CM

## 2015-12-04 DIAGNOSIS — N179 Acute kidney failure, unspecified: Principal | ICD-10-CM

## 2015-12-04 LAB — CBC
HEMATOCRIT: 23.7 % — AB (ref 40.0–52.0)
HEMOGLOBIN: 8.1 g/dL — AB (ref 13.0–18.0)
MCH: 34.2 pg — ABNORMAL HIGH (ref 26.0–34.0)
MCHC: 34.2 g/dL (ref 32.0–36.0)
MCV: 100.1 fL — AB (ref 80.0–100.0)
Platelets: 40 10*3/uL — ABNORMAL LOW (ref 150–440)
RBC: 2.37 MIL/uL — AB (ref 4.40–5.90)
RDW: 29.5 % — AB (ref 11.5–14.5)
WBC: 5.5 10*3/uL (ref 3.8–10.6)

## 2015-12-04 LAB — MAGNESIUM: Magnesium: 1.8 mg/dL (ref 1.7–2.4)

## 2015-12-04 LAB — BASIC METABOLIC PANEL
Anion gap: 10 (ref 5–15)
BUN: 63 mg/dL — AB (ref 6–20)
CHLORIDE: 106 mmol/L (ref 101–111)
CO2: 17 mmol/L — ABNORMAL LOW (ref 22–32)
Calcium: 8.4 mg/dL — ABNORMAL LOW (ref 8.9–10.3)
Creatinine, Ser: 2.71 mg/dL — ABNORMAL HIGH (ref 0.61–1.24)
GFR calc Af Amer: 26 mL/min — ABNORMAL LOW (ref >60)
GFR calc non Af Amer: 23 mL/min — ABNORMAL LOW (ref >60)
Glucose, Bld: 173 mg/dL — ABNORMAL HIGH (ref 65–99)
POTASSIUM: 3.7 mmol/L (ref 3.5–5.1)
SODIUM: 133 mmol/L — AB (ref 135–145)

## 2015-12-04 LAB — TYPE AND SCREEN
ABO/RH(D): AB POS
ANTIBODY SCREEN: NEGATIVE
Unit division: 0
Unit division: 0
Unit division: 0

## 2015-12-04 LAB — GLUCOSE, CAPILLARY
GLUCOSE-CAPILLARY: 277 mg/dL — AB (ref 65–99)
GLUCOSE-CAPILLARY: 221 mg/dL — AB (ref 65–99)
Glucose-Capillary: 171 mg/dL — ABNORMAL HIGH (ref 65–99)
Glucose-Capillary: 303 mg/dL — ABNORMAL HIGH (ref 65–99)

## 2015-12-04 LAB — PATHOLOGIST SMEAR REVIEW

## 2015-12-04 LAB — PROTIME-INR
INR: 2.38
Prothrombin Time: 25.7 seconds — ABNORMAL HIGH (ref 11.4–15.0)

## 2015-12-04 MED ORDER — FUROSEMIDE 20 MG PO TABS
20.0000 mg | ORAL_TABLET | Freq: Every day | ORAL | Status: DC
  Administered 2015-12-04: 20 mg via ORAL
  Filled 2015-12-04: qty 1

## 2015-12-04 NOTE — Progress Notes (Signed)
   12/04/15 1300  Clinical Encounter Type  Visited With Patient and family together  Visit Type Spiritual support  Referral From Nurse  Consult/Referral To Chaplain  Spiritual Encounters  Spiritual Needs Emotional;Grief support;Prayer;Sacred text  Stress Factors  Patient Stress Factors Exhausted;Health changes;Family relationships;Major life changes;Loss of control  Family Stress Factors Exhausted;Loss;Major life changes  Met w/patient & family for life review and grief counseling. Patient & family were emotional and anxious. Provided pastoral care, read [employer] & prayed. Patient was calm and resting at the end of this visit.  [place]. [place] Weight G. Ethete

## 2015-12-04 NOTE — Plan of Care (Signed)
Problem: Education: Goal: Knowledge of [facility] Education information/materials will improve Outcome: Progressing Has received General Education Handout. Oriented to Oncology unit. Instructed and Demonstrated how to use phone to call RN and CNA for Assistance. Verbalized and Demonstrated Understanding.  Problem: Safety: Goal: Ability to remain free from injury will improve Outcome: Progressing High fall risk with bed alarm activated. Instructed and Demonstrated how to use phone to call RN and CNA for Assistance. Verbalized and Demonstrated Understanding. Calls or waits for assistance. Remained free of injury of fall this shift.

## 2015-12-04 NOTE — Plan of Care (Signed)
Problem: Safety: Goal: Ability to remain free from injury will improve Outcome: Progressing Pt remains free from injury.  Fall precautions in place.  Pt calls appropriately with needs.

## 2015-12-04 NOTE — Care Management (Signed)
Admitted to [facility] with the diagnosis of acute renal disease. Lives with wife, Vaughan Basta [phone]). Followed by [facility] since September 1st. Sees Dr. Shelbie Hutching at the [facility]. Life Alert in the home [place] last Sunday. Usually appetite is poor, but since paracentesis was performed, much better per daughter Penny Pia [phone]). Will need EMS for transportation home when discharged per daughter. Shelbie Ammons RN MSN CCM Care Management [phone]

## 2015-12-04 NOTE — Progress Notes (Signed)
[facility] at [place] NAME: [name]    MR#:  [id number]  DATE OF BIRTH:  Dec 16, 1946  SUBJECTIVE:  CHIEF COMPLAINT:   Chief Complaint  Patient presents with  . Abdominal Pain  no abdominal pain or nausea, better appetite,  But abd distension.  REVIEW OF SYSTEMS:  CONSTITUTIONAL: No fever, has generalized weakness.  EYES: No blurred or double vision.  EARS, NOSE, AND THROAT: No tinnitus or ear pain.  RESPIRATORY: No cough, shortness of breath, wheezing or hemoptysis.  CARDIOVASCULAR: No chest pain, orthopnea, edema.  GASTROINTESTINAL: No nausea, no vomiting, has diarrhea and abdominal distention. No melena or bloody stool. GENITOURINARY: No dysuria, hematuria.  ENDOCRINE: No polyuria, nocturia,  HEMATOLOGY: No anemia, easy bruising or bleeding SKIN: No rash or lesion. MUSCULOSKELETAL: No joint pain or arthritis.   NEUROLOGIC: No tingling, numbness, weakness.  PSYCHIATRY: No anxiety or depression.   DRUG ALLERGIES:   Allergies  Allergen Reactions  . Actos [Pioglitazone] Anaphylaxis  . Aminophylline Other (See Comments)    Reaction: Unknown  . Amitriptyline Other (See Comments)    Reaction: Unknown   . Ampicillin Hives and Other (See Comments)    Has patient had a PCN reaction causing immediate rash, facial/tongue/throat swelling, SOB or lightheadedness with hypotension: No Has patient had a PCN reaction causing severe rash involving mucus membranes or skin necrosis: No Has patient had a PCN reaction that required hospitalization No Has patient had a PCN reaction occurring within the last 10 years: No If all of the above answers are "NO", then may proceed with Cephalosporin use.  Marland Kitchen Hctz [Hydrochlorothiazide] Other (See Comments)    Reaction: Unknown   . Naproxen Other (See Comments)    Reaction:  GI bleeding   . Penicillins Hives and Other (See Comments)    Has patient had a PCN reaction causing immediate rash,  facial/tongue/throat swelling, SOB or lightheadedness with hypotension: No Has patient had a PCN reaction causing severe rash involving mucus membranes or skin necrosis: No Has patient had a PCN reaction that required hospitalization No Has patient had a PCN reaction occurring within the last 10 years: No If all of the above answers are "NO", then may proceed with Cephalosporin use.    VITALS:  Blood pressure 98/45, pulse 80, temperature 97.5 F (36.4 C), temperature source Oral, resp. rate 18, height 5\' 4"  (1.626 m), weight 75.796 kg (167 lb 1.6 oz), SpO2 97 %.  PHYSICAL EXAMINATION:  GENERAL:  69 y.o.-year-old patient lying in the bed with no acute distress.  EYES: Pupils equal, round, reactive to light and accommodation. Has scleral icterus. Extraocular muscles intact.  HEENT: Head atraumatic, normocephalic. Oropharynx and nasopharynx clear.  NECK:  Supple, no jugular venous distention. No thyroid enlargement, no tenderness.  LUNGS: Normal breath sounds bilaterally, no wheezing, rales,rhonchi or crepitation. No use of accessory muscles of respiration.  CARDIOVASCULAR: S1, S2 normal. No murmurs, rubs, or gallops.  ABDOMEN: Soft, mild tenderness, more distended. Bowel sounds present. Positive ascites sign. Unable to estimate organomegaly or mass.  EXTREMITIES: No pedal edema, cyanosis, or clubbing.  NEUROLOGIC: Cranial nerves II through XII are intact. Muscle strength 4/5 in all extremities. Sensation intact. Gait not checked.  PSYCHIATRIC: The patient is alert and oriented x 3.  SKIN: No obvious rash, lesion, or ulcer.    LABORATORY PANEL:   CBC  Recent Labs Lab 12/04/15 0434  WBC 5.5  HGB 8.1*  HCT 23.7*  PLT 40*   ------------------------------------------------------------------------------------------------------------------  Chemistries   Recent Labs Lab 12/02/15 1336  12/04/15 0434  NA 134*  < > 133*  K 5.4*  < > 3.7  CL 104  < > 106  CO2 16*  < > 17*  GLUCOSE  160*  < > 173*  BUN 61*  < > 63*  CREATININE 2.66*  < > 2.71*  CALCIUM 9.3  < > 8.4*  MG  --   --  1.8  AST 77*  --   --   ALT 32  --   --   ALKPHOS 256*  --   --   BILITOT 8.9*  --   --   < > = values in this interval not displayed. ------------------------------------------------------------------------------------------------------------------  Cardiac Enzymes No results for input(s): TROPONINI in the last 168 hours. ------------------------------------------------------------------------------------------------------------------  RADIOLOGY:  US Paracentesis  12/02/2015  CLINICAL DATA:  Ascites and history of cirrhosis and underlying liver mass EXAM: ULTRASOUND GUIDED PARACENTESIS PROCEDURE: An ultrasound guided paracentesis was thoroughly discussed with the patient and questions answered. The benefits, risks, alternatives and complications were also discussed. The patient understands and wishes to proceed with the procedure. Written consent was obtained. Ultrasound was performed to localize and mark an adequate pocket of fluid in the right upper quadrant of the abdomen. The area was then prepped and draped in the normal sterile fashion. 1% Lidocaine was used for local anesthesia. Under ultrasound guidance, a 6 French paracentesis catheter was introduced. Paracentesis was performed. The catheter was removed and a dressing applied. COMPLICATIONS: None immediate FINDINGS: A total of approximately 5.9 L of bloody fluid was removed. A fluid sample wassent for laboratory analysis. IMPRESSION: Successful ultrasound guided paracentesis yielding 5.9 L of bloody ascites. Electronically Signed   By: Inez Catalina M.D.   On: 12/02/2015 16:47    EKG:   Orders placed or performed during the hospital encounter of 12/02/15  . ED EKG  . ED EKG    ASSESSMENT AND PLAN:   1. Acute on chronic renal failure due to due to intravascular volume depletion, worsening. discontinue IV fluids, resume half home  dose lasix (20mg  daily), follow-up BMP.  2. Anemia, likely multifactorial due to prior GI bleed as well as anemia of chronic disease, transfused 2 unit of packed red blood cells, Hb is 8.1 today. No active bleeding. Per GI consult,  Patient poor candidate for endoscopy, no EGD planned.  3. Hyperkalemia due to acute on chronic renal failure, give patient 1 dose of Kayexalate, improved  4. hyponatremia due to intravascular volume depletion, discontinue normal saline infusion and follow-up BMP.  5. thrombocytopenia, stable.  6. coagulopathy, due to liver disease, no intervention unless patient has bleeding. Follow-up INR.  * Liver cirrhosis with ascites, status post paracentesis with 6 L fluid drawn.  Ascites fluid show WBC 201 with a neutrophil 79. Per GI consult,  If treating ascites/liver disease aggreesively, may consider Ceftriaxone for SBP. Still has ascites sign, resume half home dose lasix (20mg  daily), get ultrasound guided paracentesis again tomorrow.  * Severe malnutrition in context of chronic illness. Follow-up dietitian recommendation. * Liver cancer with multiple medical problems as above, appreciate oncology and palliative care consult. Very poor prognosis. DO NOT RESUSCITATE status.  I discussed with oncologist, palliative care physician Dr. Eveline Keto, hospice staff. All the records are reviewed and case discussed with Care Management/Social Workerr. Management plans discussed with the patient, his wife and daughter and they are in agreement.  CODE STATUS: Full code  TOTAL TIME TAKING CARE OF THIS  PATIENT: 42 minutes.  Greater than 50% time was spent on coordination of care and face-to-face counseling.  POSSIBLE D/C TO HOME WITH HOSPICE CARE IN 1-2 DAYS, DEPENDING ON CLINICAL CONDITION.   Demetrios Loll M.D on 12/04/2015 at 1:54 PM  Between 7am to 6pm - Pager - [phone]  After 6pm go to [email or web address] - password [email or web address] [facility]  [facility]  Office   [phone]  CC: Primary care physician; No primary care provider on file.

## 2015-12-04 NOTE — Progress Notes (Signed)
Visit made. Patient seen sitttng up in the recliner in his room. Color remains jaundice/gray. Patient reports feeling "some better". Abdomen distended. Per chart note review and discussion with attending physician Dr. Bridgett Larsson, he is scheduled to have another paracentesis today with possible d/c tomorrow. Patient's wife and daughter in during visit. Daughter Kenney Houseman confirmed that patient has an out of facility DNR in place in the home and that she brought it to the hospital, she did not however leave it here. This information was shared with attending physician as currently patient is a Full Code. Patient reports he was able to walk with assistance to the bathroom. Per conversation with daughter Kenney Houseman he is getting weaker at home and less able to walk even with his walker. She did confirm the family/patient's wishes that he return home at discharge. CMRN Hassan Rowan updated. Will continue to follow and update hospice team. Flo Shanks RN, BSN, [place] [facility] [place], [facility]  [phone] c

## 2015-12-04 NOTE — Consult Note (Signed)
[place] CONSULT NOTE  Patient Care Team: Manya Silvas, MD as PCP - Gastroenterology (Gastroenterology)  CHIEF COMPLAINTS/PURPOSE OF CONSULTATION:  Metastatic liver cancer  HISTORY OF PRESENTING ILLNESS:  Luke Manner Sr. 69 y.o.  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  He felt extremely weak.  And also had nausea with vomiting. Patient also noted to be in acute renal failure with a creatinine of 2.7/ coagulopathic with PT of 25.  Bilirubin elevated at 8.9. Hemoglobin low at 8.1.   In the hospital so far patient had-  Paracentesis with improvement of his abdominal pain/ discomfort.  Also received IV fluids-  He seems to be improved symptomatically.  He is up in the bed having breakfast.  ROS: in general patient is a poor appetite. Chronic swelling in the legs. No headaches. Possible constipation or diarrhea. A complete 10 point review of system is done which is negative except mentioned above in history of present illness  MEDICAL HISTORY:  Past Medical History  Diagnosis Date  . Stroke ([place])   . Hypertension   . COPD (chronic obstructive pulmonary disease) ([place])   . Diabetes mellitus without complication ([place])   . H/O hiatal hernia   . Depression   . Arthritis   . Hepatitis     NASH  . Cirrhosis of liver not due to alcohol ([place])   . Obstructive sleep apnea 11/20/2014  . Hyperlipidemia   . Hiatal hernia   . GI bleeds, multiple during admission   . Restless leg syndrome   . Cancer [facility])     liver    SURGICAL HISTORY: Past Surgical History  Procedure Laterality Date  . Nose surgery    . Appendectomy    . Cardiac catheterization    . Eye surgery      leaglly blind  . Anterior cervical decompression/discectomy fusion 4 levels N/A 11/13/2014    Procedure:  Cervical three-four, Cervical four-five, Cervical  five-six, Cervical six- seven anterior cervical decompression with fusion and interbody prosthesis plating and bonegraft;  Surgeon: Consuella Lose, MD;  Location: [facility];  Service: Neurosurgery;  Laterality: N/A;  . Esophagogastroduodenoscopy N/A 08/23/2015    Procedure: ESOPHAGOGASTRODUODENOSCOPY (EGD);  Surgeon: Manya Silvas, MD;  Location: [facility] ENDOSCOPY;  Service: Endoscopy;  Laterality: N/A;  . Esophagogastroduodenoscopy (egd) with propofol N/A 07/20/2015    Procedure: ESOPHAGOGASTRODUODENOSCOPY (EGD) WITH PROPOFOL;  Surgeon: Lucilla Lame, MD;  Location: [facility] ENDOSCOPY;  Service: Endoscopy;  Laterality: N/A;    SOCIAL HISTORY: Social History   Social History  . Marital Status: Married    Spouse Name: N/A  . Number of Children: N/A  . Years of Education: N/A   Occupational History  . Not on file.   Social History Main Topics  . Smoking status: Never Smoker   . Smokeless tobacco: Never Used  . Alcohol Use: No  . Drug Use: No  . Sexual Activity: Not on file   Other Topics Concern  . Not on file   Social History Narrative    FAMILY HISTORY: Family History  Problem Relation Age of Onset  . Kidney failure Sister     CKD  . Diabetes Mellitus II Sister     ALLERGIES:  is allergic to actos; aminophylline; amitriptyline; ampicillin; hctz; naproxen; and penicillins.  MEDICATIONS:  Current Facility-Administered Medications  Medication Dose Route Frequency Provider Last  Rate Last Dose  . albumin human 25 % solution 12.5 g  12.5 g Intravenous Q6H Theodoro Grist, MD   12.5 g at 12/04/15 0421  . allopurinol (ZYLOPRIM) tablet 100 mg  100 mg Oral BID Theodoro Grist, MD   100 mg at 12/04/15 1100  . benzonatate (TESSALON) capsule 100 mg  100 mg Oral TID PRN Theodoro Grist, MD      . brimonidine (ALPHAGAN) 0.2 % ophthalmic solution 1 drop  1 drop Both Eyes BID Theodoro Grist, MD   1 drop at 12/04/15 1100   And  . timolol (TIMOPTIC) 0.5 % ophthalmic solution 1 drop  1 drop Both  Eyes BID Theodoro Grist, MD   1 drop at 12/04/15 1100  . cholestyramine (QUESTRAN) packet 4 g  4 g Oral TID Theodoro Grist, MD   4 g at 12/04/15 1100  . feeding supplement (BOOST / RESOURCE BREEZE) liquid 1 Container  1 Container Oral BID BM Demetrios Loll, MD   1 Container at 12/04/15 1000  . ferrous sulfate tablet 325 mg  325 mg Oral TID WC Theodoro Grist, MD   325 mg at 12/04/15 1204  . furosemide (LASIX) tablet 20 mg  20 mg Oral Daily Demetrios Loll, MD      . guaifenesin (ROBITUSSIN) 100 MG/5ML syrup 200 mg  200 mg Oral TID PRN Theodoro Grist, MD      . HYDROcodone-acetaminophen (NORCO/VICODIN) 5-325 MG per tablet 1 tablet  1 tablet Oral Q6H PRN Theodoro Grist, MD      . hydrOXYzine (ATARAX/VISTARIL) tablet 12.5-25 mg  12.5-25 mg Oral TID PRN Theodoro Grist, MD      . insulin aspart (novoLOG) injection 0-5 Units  0-5 Units Subcutaneous QHS Demetrios Loll, MD   2 Units at 12/03/15 2205  . insulin aspart (novoLOG) injection 0-9 Units  0-9 Units Subcutaneous TID WC Demetrios Loll, MD   5 Units at 12/04/15 1203  . insulin glargine (LANTUS) injection 7 Units  7 Units Subcutaneous Daily Demetrios Loll, MD   7 Units at 12/04/15 1000  . lactulose (CHRONULAC) 10 GM/15ML solution 30 g  30 g Oral BID Theodoro Grist, MD   30 g at 12/04/15 1100  . ondansetron (ZOFRAN) tablet 4 mg  4 mg Oral Q6H PRN Theodoro Grist, MD       Or  . ondansetron (ZOFRAN) injection 4 mg  4 mg Intravenous Q6H PRN Theodoro Grist, MD      . pantoprazole (PROTONIX) EC tablet 40 mg  40 mg Oral Daily Theodoro Grist, MD   40 mg at 12/04/15 1100  . polyvinyl alcohol (LIQUIFILM TEARS) 1.4 % ophthalmic solution 1 drop  1 drop Both Eyes QID Theodoro Grist, MD   1 drop at 12/04/15 1100  . rifaximin (XIFAXAN) tablet 550 mg  550 mg Oral BID Theodoro Grist, MD   550 mg at 12/04/15 1100  . sertraline (ZOLOFT) tablet 50 mg  50 mg Oral Daily Theodoro Grist, MD   50 mg at 12/04/15 1100      .  PHYSICAL EXAMINATION: ECOG PERFORMANCE STATUS: 3 - Symptomatic, >50% confined to  bed  Filed Vitals:   12/03/15 2108 12/04/15 0535  BP: 99/52 98/45  Pulse: 86 80  Temp: 98.1 F (36.7 C) 97.5 F (36.4 C)  Resp: 18 18   Filed Weights   12/02/15 1341 12/02/15 1801 12/04/15 0500  Weight: 173 lb 1 oz (78.5 kg) 158 lb 9.6 oz (71.94 kg) 167 lb 1.6 oz (75.796 kg)    GENERAL:  Ill-appearing Caucasian male patient; sitting up in the bed at breakfast. Patient is alone. EYES: positive for pallor' icterus. OROPHARYNX: no thrush or ulceration;  Poor dentition NECK: supple, no masses felt LYMPH:  no palpable lymphadenopathy in the cervical, axillary or inguinal regions LUNGS:  Decreased breath sounds bilaterally. No wheeze or crackles HEART/CVS: regular rate & rhythm and no murmurs;  Positive for edema ABDOMEN: abdomen soft, distended non-tender and normal bowel sounds Musculoskeletal:no cyanosis of digits and no clubbing  PSYCH: alert & oriented x 3 with fluent speech NEURO: no focal motor/sensory deficits SKIN:   Ecchymosis noted.  LABORATORY DATA:  I have reviewed the data as listed Lab Results  Component Value Date   WBC 5.5 12/04/2015   HGB 8.1* 12/04/2015   HCT 23.7* 12/04/2015   MCV 100.1* 12/04/2015   PLT 40* 12/04/2015    Recent Labs  08/09/15 0127  09/18/15 1100  10/25/15 0845 11/22/15 0943 12/02/15 1336 12/02/15 1912 12/03/15 0456 12/04/15 0434  NA  --   < >  --   < >  --  130* 134*  --  133* 133*  K  --   < >  --   < >  --  4.4 5.4*  --  4.3 3.7  CL  --   < >  --   < >  --  99* 104  --  106 106  CO2  --   < >  --   < >  --  22 16*  --  18* 17*  GLUCOSE  --   < >  --   < >  --  157* 160*  --  248* 173*  BUN  --   < >  --   < >  --  43* 61*  --  63* 63*  CREATININE  --   < > 1.00  < > 1.47* 1.62* 2.66* 2.61* 2.73* 2.71*  CALCIUM  --   < >  --   < >  --  9.4 9.3  --  8.5* 8.4*  GFRNONAA  --   < > >60  < > 47* 42* 23* 24* 22* 23*  GFRAA  --   < > >60  < > 55* 49* 27* 27* 26* 26*  PROT  --   < > 7.8  < > 8.1 7.8 7.6  --   --   --   ALBUMIN  --    < > 2.6*  < > 3.0* 2.9* 2.9*  --   --   --   AST  --   < > 97*  < > 86* 79* 77*  --   --   --   ALT  --   < > 38  < > 35 32 32  --   --   --   ALKPHOS  --   < > 161*  < > 232* 278* 256*  --   --   --   BILITOT  --   < > 8.4*  < > 5.6* 6.2* 8.9*  --   --   --   BILIDIR 2.6*  --  4.4*  --  2.9*  --   --   --   --   --   IBILI  --   --  4.0*  --  2.7*  --   --   --   --   --   < > = values in this interval not displayed.  RADIOGRAPHIC STUDIES: I have personally reviewed the radiological images as listed and agreed with the findings in the report. US Paracentesis  12/02/2015  CLINICAL DATA:  Ascites and history of cirrhosis and underlying liver mass EXAM: ULTRASOUND GUIDED PARACENTESIS PROCEDURE: An ultrasound guided paracentesis was thoroughly discussed with the patient and questions answered. The benefits, risks, alternatives and complications were also discussed. The patient understands and wishes to proceed with the procedure. Written consent was obtained. Ultrasound was performed to localize and mark an adequate pocket of fluid in the right upper quadrant of the abdomen. The area was then prepped and draped in the normal sterile fashion. 1% Lidocaine was used for local anesthesia. Under ultrasound guidance, a 6 French paracentesis catheter was introduced. Paracentesis was performed. The catheter was removed and a dressing applied. COMPLICATIONS: None immediate FINDINGS: A total of approximately 5.9 L of bloody fluid was removed. A fluid sample wassent for laboratory analysis. IMPRESSION: Successful ultrasound guided paracentesis yielding 5.9 L of bloody ascites. Electronically Signed   By: Inez Catalina M.D.   On: 12/02/2015 16:47    ASSESSMENT & PLAN:    69 year old male patient with above history of  Decompensated cirrhosis/ hepatocellular cancer-  On hospice currently admitted to hospital for  #  Abdominal distention increasing ascites-  Decompensating cirrhosis/ likely secondary to  progression of disease.  Status post paracentesis symptomatically improved.   #  Acute renal failure-  Likely part of  Worsening hepatocellular cancer.  On IV fluids.  #  Hepatocellular cancer/  Progression noted- on clinical basis/ liver failure-  Coagulopathy.  I recommend continuing the hospice.  I discussed with the patient's daughter [name] over the phone-  Re:  Overall worse and declining status/  And the fact the patient  Should  Consider hospice home;  Rather than hospice with his own home.  The daughter was undecided;  However she was going to talk to her mother/  And her father.  #  CODE STATUS-  Patient is currently full code;  However I would recommend DNR/DNI.  I reviewed this with the patient's daughter;  Who had not made any decisions.  She was going to talk her mother/  Her father- patient;  And then make  Appropriate decisions.  #  I also discussed the above plan with Dr. Bridgett Larsson;  Also discussed with Dr. Megan Salon.   Thank you Dr. Bridgett Larsson for allowing me to participate in the care of your pleasant patient. Please do not hesitate to contact me with questions or concerns in the interim.     Cammie Sickle, MD 12/04/2015 1:23 PM

## 2015-12-04 NOTE — Evaluation (Signed)
Physical Therapy Evaluation Patient Details Name: BASHEER RAGUCCI Sr. MRN: [id number] DOB: [birth year] Today's Date: 12/04/2015   History of Present Illness  Pt is 69 year old admitted for acute renal failure. Pt with history of cirrhosis, CVA, HTN, COPD, DM and depression. Pt with complaints of abdominal pain and weakness. Now s/p removal of 5.9L of fluid through paracentesis.  Pt is currently open to hospice and has had falls in the past.  Clinical Impression  Pt is a pleasant 69 year old male who was admitted for acute renal failure. Pt performs bed mobility with supervision and transfers/ambulation with cga and rw. Pt fatigues quickly secondary to decreased O2 sats. Plan to ambulate further on O2 for future treatments. Pt demonstrates deficits with strength/balance/mobility. Pt very motivated to work with physical therapy. Would benefit from skilled PT to address above deficits and promote optimal return to PLOF      Follow Up Recommendations Home health PT;Supervision/Assistance - 24 hour (with hospice)    Equipment Recommendations  None recommended by PT    Recommendations for Other Services       Precautions / Restrictions Precautions Precautions: Fall Restrictions Weight Bearing Restrictions: No      Mobility  Bed Mobility Overal bed mobility: Needs Assistance Bed Mobility: Supine to Sit     Supine to sit: Supervision     General bed mobility comments: safe technique performed, however takes extended time to complete. Once seated at EOB, pt able to sit with independence  Transfers Overall transfer level: Needs assistance Equipment used: Rolling walker (2 wheeled) Transfers: Sit to/from Stand Sit to Stand: Min guard         General transfer comment: sit<>Stand with rw and cga. Safe technique performed with 1 cues for correct hand placement  Ambulation/Gait Ambulation/Gait assistance: Min guard Ambulation Distance (Feet): 40 Feet Assistive device: Rolling  walker (2 wheeled) Gait Pattern/deviations: Step-through pattern     General Gait Details: ambulated to bathroom on room air with O2 sats decreasing to 82%. Seated rest break given and then ambulation performed back to recliner on 2L of O2 with sats at 88%. Once seated, sats improved to 93%. HR increased to 138bpm with exertion. Further distance deferred at this time. Safe technique performed with no LOB noted. Reciprocal gait pattern  Stairs            Wheelchair Mobility    Modified Rankin (Stroke Patients Only)       Balance Overall balance assessment: History of Falls;Needs assistance Sitting-balance support: Bilateral upper extremity supported;Feet supported Sitting balance-Leahy Scale: Fair     Standing balance support: Bilateral upper extremity supported Standing balance-Leahy Scale: Fair                               Pertinent Vitals/Pain Pain Assessment: No/denies pain    Home Living Family/patient expects to be discharged to:: Private residence Living Arrangements: Spouse/significant other Available Help at Discharge: Family;Available 24 hours/day Type of Home: House Home Access: Level entry;Ramped entrance     Home Layout: One level Home Equipment: Walker - 2 wheels;Cane - single point;Grab bars - toilet;Grab bars - tub/shower;Hospital bed;Bedside commode;Shower seat      Prior Function Level of Independence: Independent with assistive device(s)         Comments: needs assistance for tolieting/bathing     Hand Dominance        Extremity/Trunk Assessment   Upper Extremity Assessment: Generalized weakness (grossly  3+/5)           Lower Extremity Assessment: Generalized weakness (grossly 4/5)         Communication   Communication: No difficulties  Cognition Arousal/Alertness: Awake/alert Behavior During Therapy: WFL for tasks assessed/performed Overall Cognitive Status: Within Functional Limits for tasks assessed                       General Comments      Exercises Other Exercises Other Exercises: Pt required min assist for donning/doffing diaper in bathroom as well as min assist for hygiene. No LOB during mobility. Other Exercises: Pt also completed B LE ther-ex in supine position including ankle pumps, quad sets, SLRs, and hip abd/add. Pt completed 10 reps with cues for correct technique. CGA given for completion.      Assessment/Plan    PT Assessment Patient needs continued PT services  PT Diagnosis Difficulty walking;Generalized weakness   PT Problem List Decreased strength;Decreased mobility  PT Treatment Interventions Gait training;Therapeutic exercise;Therapeutic activities   PT Goals (Current goals can be found in the Care Plan section) Acute Rehab PT Goals Patient Stated Goal: to go home PT Goal Formulation: With patient Time For Goal Achievement: 12/18/15 Potential to Achieve Goals: Good    Frequency Min 2X/week   Barriers to discharge        Co-evaluation               End of Session Equipment Utilized During Treatment: Gait belt;Oxygen Activity Tolerance: Patient limited by fatigue Patient left: in chair;with chair alarm set Nurse Communication: Mobility status         Time: [id number] PT Time Calculation (min) (ACUTE ONLY): 44 min   Charges:   PT Evaluation $Initial PT Evaluation Tier I: 1 Procedure PT Treatments $Therapeutic Exercise: 8-22 mins $Therapeutic Activity: 8-22 mins   PT G Codes:        Kimisha Eunice 01/03/2016, 2:43 PM  Greggory Stallion, PT, DPT [phone]

## 2015-12-05 ENCOUNTER — Inpatient Hospital Stay

## 2015-12-05 DIAGNOSIS — R160 Hepatomegaly, not elsewhere classified: Secondary | ICD-10-CM

## 2015-12-05 LAB — BASIC METABOLIC PANEL
Anion gap: 8 (ref 5–15)
BUN: 63 mg/dL — AB (ref 6–20)
CALCIUM: 8.1 mg/dL — AB (ref 8.9–10.3)
CHLORIDE: 103 mmol/L (ref 101–111)
CO2: 16 mmol/L — ABNORMAL LOW (ref 22–32)
CREATININE: 3.12 mg/dL — AB (ref 0.61–1.24)
GFR calc Af Amer: 22 mL/min — ABNORMAL LOW (ref >60)
GFR calc non Af Amer: 19 mL/min — ABNORMAL LOW (ref >60)
Glucose, Bld: 237 mg/dL — ABNORMAL HIGH (ref 65–99)
Potassium: 3.7 mmol/L (ref 3.5–5.1)
SODIUM: 127 mmol/L — AB (ref 135–145)

## 2015-12-05 LAB — GLUCOSE, CAPILLARY: GLUCOSE-CAPILLARY: 217 mg/dL — AB (ref 65–99)

## 2015-12-05 LAB — OCCULT BLOOD X 1 CARD TO LAB, STOOL: Fecal Occult Bld: POSITIVE — AB

## 2015-12-05 MED ORDER — ALLOPURINOL 100 MG PO TABS: 100.0000 mg | ORAL_TABLET | Freq: Every day | ORAL | Status: DC

## 2015-12-05 MED ORDER — MORPHINE SULFATE (CONCENTRATE) 10 MG/0.5ML PO SOLN: 10.0000 mg | ORAL | Status: AC | PRN

## 2015-12-05 MED ORDER — ALLOPURINOL 100 MG PO TABS: 100.0000 mg | ORAL_TABLET | Freq: Every day | ORAL | Status: AC

## 2015-12-05 MED ORDER — MORPHINE SULFATE (CONCENTRATE) 10 MG/0.5ML PO SOLN: 10.0000 mg | ORAL | Status: DC | PRN

## 2015-12-05 NOTE — Progress Notes (Signed)
Palliative Care Update   I have been in communication with Hospice Liaison nurse, and have discussed pt with Dr. Rogue Bussing.  It seems pt had a valid DNR form that came with pt to the ER, but somehow went home with family instead of staying with pt as he was admitted. His orders now state he is changed to DNR status.  It is also known that [place] is not a consideration for this pt / family.  I will talk further with Hospice staff to be sure there is ongoing clarity about goals of care, and if needed, will become involved in helping to further that objective.  For now, I am following at a distance since some things have already been clarified with the involvement of Hospice.    Colleen Can, MD

## 2015-12-05 NOTE — Progress Notes (Signed)
Update to previous note. Writer spoke with the family along with attending physician Dr. Leslye Peer. Patient's renal function is worsening. Dr. Leslye Peer explained to [name] and his family that his kidney's are no longer able to tolerate the higher doses of diuretics and that the abdominal fluid will continue to reaccumulate. He asked [name] what he wanted and patient himself has requested to go t the hospice home. [name] was very upset, but she and her mother are in agreement with this plan. Patient to transfer to the hospice home today after paracentesis is performed. Report called to the hospice home, triage notified that oxygen does not need to be deliver to the home. Home care team alerted to change in plan. Hospital care team all aware of and in agreement of plan to discharge today to the hospice home via EMS with portable DNR in place.  Flo Shanks RN, BSN, [place] [facility] [place], [facility] [phone] c

## 2015-12-05 NOTE — Plan of Care (Signed)
Problem: Education: Goal: Knowledge of [place] [facility] Education information/materials will improve Outcome: Progressing Pt is NPO after midnight for procedure. Remains incontinent of stool.      Problem: Safety: Goal: Ability to remain free from injury will improve Outcome: Progressing Pt is a high fall risk, encouraged to call for assistance when needed. 1-2  assist to the [facility].

## 2015-12-05 NOTE — Progress Notes (Signed)
Luke Manner Sr.   DOB:1946/01/02   [id number]    Subjective: Patient admits to abdominal discomfort/abdominal pain. Bloated abdomen/distended. Feels weak. Positive for nausea without vomiting. Poor appetite.  Objective:  Filed Vitals:   12/05/15 1226 12/05/15 1319  BP: 92/50 96/48  Pulse: 90 88  Temp:  97.5 F (36.4 C)  Resp: 12 20     Intake/Output Summary (Last 24 hours) at 12/05/15 1724 Last data filed at 12/05/15 1310  Gross per 24 hour  Intake 981.25 ml  Output    200 ml  Net 781.25 ml    GENERAL: Drowsy easily arousable.; Sick appearing cachectic male patient. Jaundice. SKIN: Positive for jaundice/pale.  EYES: normal, Conjunctiva are pink and non-injected, sclera clear OROPHARYNX: Poor dentition LUNGS: Decreased breath sounds at the bases bilaterally. HEART: Regular rhythm and tachycardia. No murmurs. ABDOMEN: Soft distended. Mild tenderness. Positive for hepatomegaly.  NEURO: Drowsy ; easily arousable.   Labs:  Lab Results  Component Value Date   WBC 5.5 12/04/2015   HGB 8.1* 12/04/2015   HCT 23.7* 12/04/2015   MCV 100.1* 12/04/2015   PLT 40* 12/04/2015   NEUTROABS 5.9 12/02/2015    Lab Results  Component Value Date   NA 127* 12/05/2015   K 3.7 12/05/2015   CL 103 12/05/2015   CO2 16* 12/05/2015    Studies:  US Paracentesis  12/05/2015  CLINICAL DATA:  69 year old male with a history of recurrent ascites. Patient has been hypotensive. EXAM: ULTRASOUND GUIDED  PARACENTESIS COMPARISON:  12/02/2015 PROCEDURE: An ultrasound guided paracentesis was thoroughly discussed with the patient and questions answered. The benefits, risks, alternatives and complications were also discussed. The patient understands and wishes to proceed with the procedure. Written consent was obtained. Ultrasound was performed to localize and mark an adequate pocket of fluid in the right lower quadrant of the abdomen. The area was then prepped and draped in the normal sterile  fashion. 1% Lidocaine was used for local anesthesia. Under ultrasound guidance a Safe-T-Centesis catheter was introduced. Paracentesis was performed. The catheter was removed and a dressing applied. COMPLICATIONS: None. FINDINGS: A total of approximately 4.0 L of serosanguineous fluid was removed. Systolic blood pressure changed during the procedure from 98 systolic to 85 systolic. Paracentesis was terminated. IMPRESSION: Status post ultrasound-guided paracentesis. 4 L of serosanguineous fluid removed. Signed, Dulcy Fanny. Earleen Newport, DO [facility] Electronically Signed   By: Corrie Mckusick D.O.   On: 12/05/2015 14:01    Assessment & Plan:   # Hepatocellular cancer decompensated liver failure- I recommend hospice; at hospice home.  # Symptomatic management with therapeutic paracentesis. Prognosis continues to be very poor. I expect like expectancy in the order of a few days-few weeks.  This was discussed at length with the patient's daughter.    Cammie Sickle, MD 12/05/2015  5:24 PM

## 2015-12-05 NOTE — Discharge Summary (Signed)
[facility] NAME: [name]    MR#:  [id number]  DATE OF BIRTH:  1946/11/05  DATE OF ADMISSION:  12/02/2015 ADMITTING PHYSICIAN: Theodoro Grist, MD  DATE OF DISCHARGE: 12/05/2015 discharge to the hospice home  PRIMARY CARE PHYSICIAN: [place]am[place]   ADMISSION DIAGNOSIS:  Dehydration [E86.0] Ascites [R18.8] Intractable vomiting with nausea, vomiting of unspecified type [R11.10]  DISCHARGE DIAGNOSIS:  Active Problems:   Acute renal failure (ARF) (HCC)   Protein-calorie malnutrition, severe   Pressure ulcer   SECONDARY DIAGNOSIS:   Past Medical History  Diagnosis Date  . Stroke ([place])   . Hypertension   . COPD (chronic obstructive pulmonary disease) ([place])   . Diabetes mellitus without complication ([place])   . H/O hiatal hernia   . Depression   . Arthritis   . Hepatitis     NASH  . Cirrhosis of liver not due to alcohol ([place])   . Obstructive sleep apnea 11/20/2014  . Hyperlipidemia   . Hiatal hernia   . GI bleeds, multiple during admission   . Restless leg syndrome   . Cancer ([place])     liver    HOSPITAL COURSE:   1. Liver cancer, cirrhosis of the liver secondary to [facility], ascites with quick reoccurrence after paracentesis, hepatorenal syndrome, thrombocytopenia. Overall prognosis is poor. Patient is a DO NOT RESUSCITATE. He will be transferred to the hospice home today after speaking with family patient and hospice liaison. The patient will have a symptomatic paracentesis done prior to discharge. Xifaxan and lactulose given to prevent hepatic encephalopathy. 2. Acute on chronic renal failure- creatinine worsening with diuresis. I believe this is now hepatorenal syndrome which overall prognosis is very poor. 3. Relative hypotension 4. Hyponatremia 5. Symptomatic anemia- patient was given a unit of blood and hemoglobin came up to 8.1 6. Type 2 diabetes- short acting insulin prior to meals only 7.  History of gout- renally dosed allopurinol once a day   DISCHARGE CONDITIONS:   Guarded  CONSULTS OBTAINED:  Treatment Team:  Manya Silvas, MD Cammie Sickle, MD Josefine Class, MD  DRUG ALLERGIES:   Allergies  Allergen Reactions  . Actos [Pioglitazone] Anaphylaxis  . Aminophylline Other (See Comments)    Reaction: Unknown  . Amitriptyline Other (See Comments)    Reaction: Unknown   . Ampicillin Hives and Other (See Comments)    Has patient had a PCN reaction causing immediate rash, facial/tongue/throat swelling, SOB or lightheadedness with hypotension: No Has patient had a PCN reaction causing severe rash involving mucus membranes or skin necrosis: No Has patient had a PCN reaction that required hospitalization No Has patient had a PCN reaction occurring within the last 10 years: No If all of the above answers are "NO", then may proceed with Cephalosporin use.  Marland Kitchen Hctz [Hydrochlorothiazide] Other (See Comments)    Reaction: Unknown   . Naproxen Other (See Comments)    Reaction:  GI bleeding   . Penicillins Hives and Other (See Comments)    Has patient had a PCN reaction causing immediate rash, facial/tongue/throat swelling, SOB or lightheadedness with hypotension: No Has patient had a PCN reaction causing severe rash involving mucus membranes or skin necrosis: No Has patient had a PCN reaction that required hospitalization No Has patient had a PCN reaction occurring within the last 10 years: No If all of the above answers are "NO", then may proceed with Cephalosporin use.    DISCHARGE MEDICATIONS:  Current Discharge Medication List    START taking these medications   Details  Morphine Sulfate (MORPHINE CONCENTRATE) 10 MG/0.5ML SOLN concentrated solution Take 0.5 mLs (10 mg total) by mouth every 2 (two) hours as needed for severe pain. Qty: 42 mL, Refills: 0      CONTINUE these medications which have CHANGED   Details  allopurinol (ZYLOPRIM) 100 MG  tablet Take 1 tablet (100 mg total) by mouth daily. Qty: 60 tablet, Refills: 1      CONTINUE these medications which have NOT CHANGED   Details  acetaminophen (TYLENOL) 325 MG tablet Take 650 mg by mouth every 4 (four) hours as needed for mild pain, fever or headache.    benzonatate (TESSALON) 100 MG capsule Take 100 mg by mouth 3 (three) times daily as needed for cough.    brimonidine-timolol (COMBIGAN) 0.2-0.5 % ophthalmic solution Place 1 drop into both eyes 2 (two) times daily.    Carboxymethylcellul-Glycerin (OPTIVE) 0.5-0.9 % SOLN Apply 1 drop to eye 4 (four) times daily.    colchicine 0.6 MG tablet Take 0.6 mg by mouth daily as needed (for gout flares).    Associated Diagnoses: Hepatocellular carcinoma ([place])    furosemide (LASIX) 40 MG tablet Take 1 tablet (40 mg total) by mouth daily. Qty: 30 tablet, Refills: 1    HYDROcodone-acetaminophen (NORCO/VICODIN) 5-325 MG tablet Take 1 tablet by mouth every 6 (six) hours as needed for moderate pain.    hydrOXYzine (ATARAX/VISTARIL) 25 MG tablet Take 12.5-25 mg by mouth 3 (three) times daily as needed for itching.    insulin aspart (NOVOLOG) 100 UNIT/ML injection Inject 10 Units into the skin 3 (three) times daily with meals. Qty: 10 mL, Refills: 11    lactulose (CHRONULAC) 10 GM/15ML solution Take 45 mLs (30 g total) by mouth 2 (two) times daily. Qty: 240 mL, Refills: 0    omeprazole (PRILOSEC) 20 MG capsule Take 1 capsule (20 mg total) by mouth 2 (two) times daily before a meal. Qty: 60 capsule, Refills: 1    ondansetron (ZOFRAN ODT) 4 MG disintegrating tablet Take 1 tablet (4 mg total) by mouth every 8 (eight) hours as needed for nausea or vomiting. Qty: 60 tablet, Refills: 2   Associated Diagnoses: Hepatocellular carcinoma ([place])    rifaximin (XIFAXAN) 550 MG TABS tablet Take 1 tablet (550 mg total) by mouth 2 (two) times daily. Qty: 60 tablet, Refills: 1    sertraline (ZOLOFT) 50 MG tablet Take 50 mg by mouth daily.       STOP taking these medications     cholecalciferol (VITAMIN D) 1000 UNITS tablet      cholestyramine (QUESTRAN) 4 G packet      ferrous sulfate 325 (65 FE) MG tablet      guaifenesin (ROBITUSSIN) 100 MG/5ML syrup      insulin glargine (LANTUS) 100 UNIT/ML injection      spironolactone (ALDACTONE) 25 MG tablet          DISCHARGE INSTRUCTIONS:   Follow-up with hospice home one day  If you experience worsening of your admission symptoms, develop shortness of breath, life threatening emergency, suicidal or homicidal thoughts you must seek medical attention immediately by calling 911 or calling your MD immediately  if symptoms less severe.  You Must read complete instructions/literature along with all the possible adverse reactions/side effects for all the Medicines you take and that have been prescribed to you. Take any new Medicines after you have completely understood and accept all the possible adverse reactions/side  effects.   Please note  You were cared for by a hospitalist during your hospital stay. If you have any questions about your discharge medications or the care you received while you were in the hospital after you are discharged, you can call the unit and asked to speak with the hospitalist on call if the hospitalist that took care of you is not available. Once you are discharged, your primary care physician will handle any further medical issues. Please note that NO REFILLS for any discharge medications will be authorized once you are discharged, as it is imperative that you return to your primary care physician (or establish a relationship with a primary care physician if you do not have one) for your aftercare needs so that they can reassess your need for medications and monitor your lab values.    Today   CHIEF COMPLAINT:   Chief Complaint  Patient presents with  . Abdominal Pain    HISTORY OF PRESENT ILLNESS:  [name]  is a 69 y.o. male with a known  history of liver cancer, cirrhosis secondary to [facility] presented with abdominal pain and distention and massive ascites   VITAL SIGNS:  Blood pressure 92/51, pulse 94, temperature 97.9 F (36.6 C), temperature source Oral, resp. rate 16, height 5\' 4"  (1.626 m), weight 77.747 kg (171 lb 6.4 oz), SpO2 98 %.    PHYSICAL EXAMINATION:  GENERAL:  69 y.o.-year-old patient lying in the bed with no acute distress.  EYES: Pupils equal, round, reactive to light and accommodation. Positive scleral icterus. Extraocular muscles intact.  HEENT: Head atraumatic, normocephalic. Oropharynx and nasopharynx clear.  NECK:  Supple, no jugular venous distention. No thyroid enlargement, no tenderness.  LUNGS: Normal breath sounds bilaterally, no wheezing, rales,rhonchi or crepitation. No use of accessory muscles of respiration.  CARDIOVASCULAR: S1, S2 normal. No murmurs, rubs, or gallops.  ABDOMEN: Soft, nontender and severe distended. Bowel sounds present. Positive fluid wave EXTREMITIES: 2+ edema, no cyanosis, or clubbing.  NEUROLOGIC: Cranial nerves II through XII are intact. Muscle strength 5/5 in all extremities. Sensation intact. Gait not checked.  PSYCHIATRIC: The patient is alert and oriented x 3.  SKIN: No obvious rash, lesion, or ulcer. Jaundice.  DATA REVIEW:   CBC  Recent Labs Lab 12/04/15 0434  WBC 5.5  HGB 8.1*  HCT 23.7*  PLT 40*    Chemistries   Recent Labs Lab 12/02/15 1336  12/04/15 0434 12/05/15 0441  NA 134*  < > 133* 127*  K 5.4*  < > 3.7 3.7  CL 104  < > 106 103  CO2 16*  < > 17* 16*  GLUCOSE 160*  < > 173* 237*  BUN 61*  < > 63* 63*  CREATININE 2.66*  < > 2.71* 3.12*  CALCIUM 9.3  < > 8.4* 8.1*  MG  --   --  1.8  --   AST 77*  --   --   --   ALT 32  --   --   --   ALKPHOS 256*  --   --   --   BILITOT 8.9*  --   --   --   < > = values in this interval not displayed.   Microbiology Results  Results for orders placed or performed during the hospital encounter of  12/02/15  Body fluid culture     Status: None (Preliminary result)   Collection Time: 12/02/15  3:25 PM  Result Value Ref Range Status   Specimen Description  PERITONEAL  Final   Special Requests NONE  Final   Gram Stain FEW WBC SEEN NO ORGANISMS SEEN   Final   Culture NO GROWTH 3 DAYS  Final   Report Status PENDING  Incomplete    Management plans discussed with the patient, family and they are in agreement.  CODE STATUS:     Code Status Orders        Start     Ordered   12/04/15 1318  Do not attempt resuscitation (DNR)   Continuous    Question Answer Comment  In the event of cardiac or respiratory ARREST Do not call a "code blue"   In the event of cardiac or respiratory ARREST Do not perform Intubation, CPR, defibrillation or ACLS   In the event of cardiac or respiratory ARREST Use medication by any route, position, wound care, and other measures to relive pain and suffering. May use oxygen, suction and manual treatment of airway obstruction as needed for comfort.      12/04/15 1320    Advance Directive Documentation        Most Recent Value   Type of Advance Directive  Healthcare Power of Attorney, Living will   Pre-existing out of facility DNR order (yellow form or pink MOST form)     "MOST" Form in Place?        TOTAL TIME TAKING CARE OF THIS PATIENT: 35 minutes.    Loletha Grayer M.D on 12/05/2015 at 11:32 AM  Between 7am to 6pm - Pager - [phone]  After 6pm go to [email or web address] - password [email or web address] [facility]  Office  [phone]  CC: Primary care physician; [place]

## 2015-12-05 NOTE — Progress Notes (Signed)
EMS notified for pick up, family and Hospital care team made aware. Discharge summary faxed to hospice home.Thank you. Flo Shanks RN, BSN, [place] [facility] [place], [facility] [phone] c

## 2015-12-05 NOTE — Progress Notes (Signed)
Visit made. Patient seen lying in bed, easily awakened to voice, wife Vaughan Basta and daughter Kenney Houseman present at bedside. Patient complained of pain, reported to staff RN Daleen Snook. Plan remains for patient to have a paracentesis today with possible discharge home after that. Family wants very much for patient to return home. He appears more lethargic today but was able to swallow his pills. He is now requiring oxygen at 2 liters via nasal cannula. Hospice triage notified, oxygen to be delivered today. Per Kenney Houseman best number for delivery is [phone], number given to triage. Per chart note review patient is now a DNR. CMRN Hassan Rowan aware of need for EMS transport.  Flo Shanks RN, BSN, [place] [facility] [place], [facility] [phone] c

## 2015-12-05 NOTE — Progress Notes (Addendum)
Inpatient Diabetes Program Recommendations  AACE/ADA: New Consensus Statement on Inpatient Glycemic Control (2015)  Target Ranges:  Prepandial:   less than 140 mg/dL      Peak postprandial:   less than 180 mg/dL (1-2 hours)      Critically ill patients:  140 - 180 mg/dL   Review of Glycemic Control  Results for [name], [name] (MRN [id number]) as of 12/05/2015 10:04  Ref. Range 12/04/2015 07:20 12/04/2015 11:16 12/04/2015 16:40 12/04/2015 21:47 12/05/2015 07:17  Glucose-Capillary Latest Ref Range: 65-99 mg/dL 171 (H) 277 (H) 303 (H) 221 (H) 217 (H)   Diabetes history: DM2 Outpatient Diabetes medications: Lantus 15 units QHS, Novlog 10 units TID with meals Current orders for Inpatient glycemic control: Lantus 7 units QHS, Novlog 0-9 units TID with meals, 0-5 units qhs  Inpatient Diabetes Program Recommendations: Consider increasing Lantus to 10 units qhs.  Since post prandial blood sugars are elevatedand patient is NPO, consider increasing Novolog correction to 0-15 units q4h.  Gentry Fitz, RN, BA, MHA, CDE Diabetes Coordinator Inpatient Diabetes Program  [phone] (Team Pager) [phone] ([place]) 12/05/2015 10:07 AM

## 2015-12-05 NOTE — Progress Notes (Signed)
MD making rounds. Discharge orders received. Santiago Glad, Hospice RN facilitating discharge to [facility]. Discharge packet being prepared. Telemetry Removed. IV removed. Vital Signs Stable. No signs and/or symptoms of distress noted. Denies pain. Denies needs. No report needed. No unanswered questions. EMS called for transport. Awaiting EMS. EMS on Unit for transport. Discharge Packet given to EMS transporter. Belongings sent with family.

## 2015-12-06 LAB — BODY FLUID CULTURE: CULTURE: NO GROWTH

## 2015-12-22 DEATH — deceased

## 2015-12-27 ENCOUNTER — Inpatient Hospital Stay: Payer: Medicare Other

## 2016-01-24 ENCOUNTER — Ambulatory Visit

## 2016-01-24 ENCOUNTER — Other Ambulatory Visit

## 2016-01-24 ENCOUNTER — Ambulatory Visit: Admitting: Internal Medicine

## 2016-04-13 IMAGING — CT CT CHEST W/ CM
1 of 3 series · 12 of 32 positions shown, 17 images · IV contrast (omnipaque)
Comparison: CT of the abdomen and pelvis 12/25/2014.

CLINICAL DATA: 69-year-old male with history of NASH and cirrhosis,
with history of recurrent GI bleeding. Weakness. Jaundice. Abnormal
liver function tests. Possible hepatic mass noted on recent
ultrasound examination.

EXAM:
CT CHEST, ABDOMEN, AND PELVIS WITH CONTRAST
TECHNIQUE: Multidetector CT imaging of the chest, abdomen and pelvis was
performed following the standard protocol during bolus
administration of intravenous contrast.
CONTRAST:  100mL OMNIPAQUE IOHEXOL 350 MG/ML SOLN

[Series 2: cap with · axial · 0.91mm/px · z∈[+100,+675]mm · 12 of 131 slices shown, 17 images]
[im 8/131  soft-tissue]
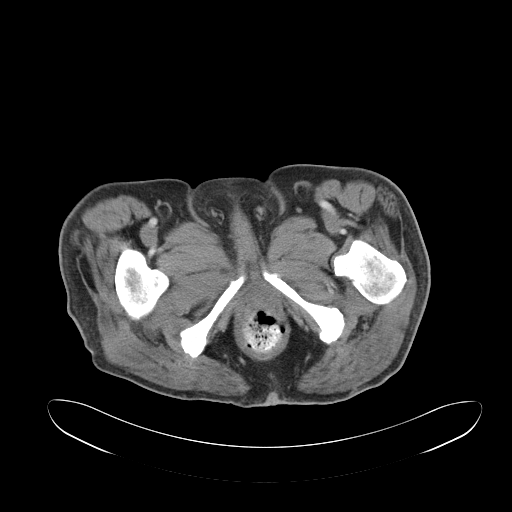
[im 8/131  bone]
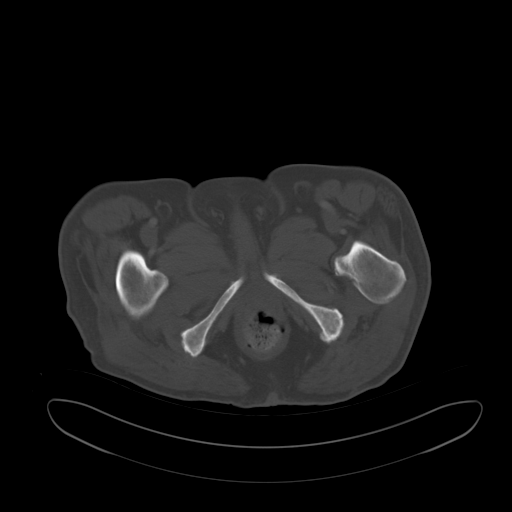
[im 22/131  soft-tissue]
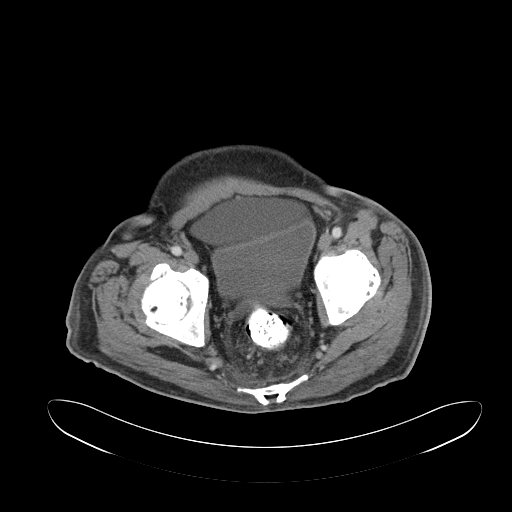
[im 29/131  soft-tissue]
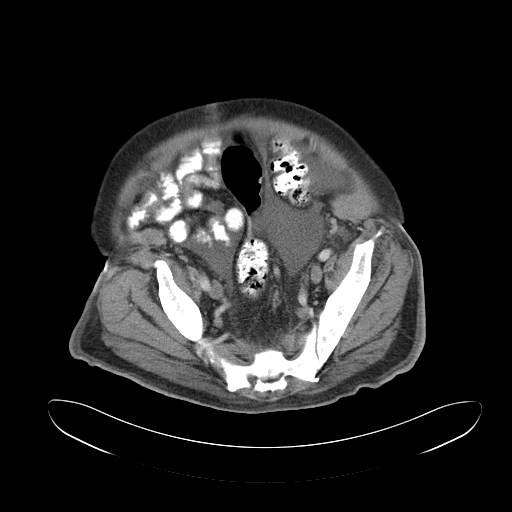
[im 44/131  soft-tissue]
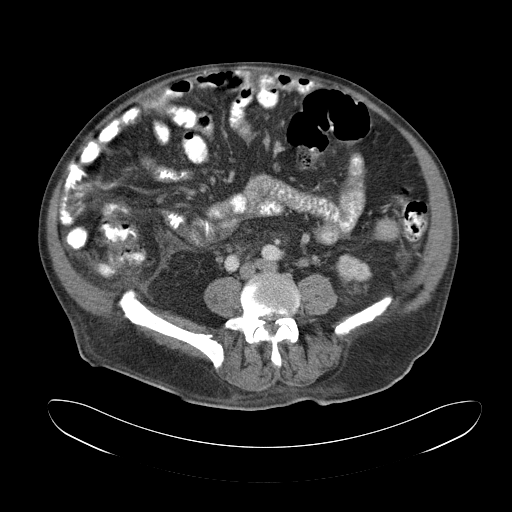
[im 51/131  soft-tissue]
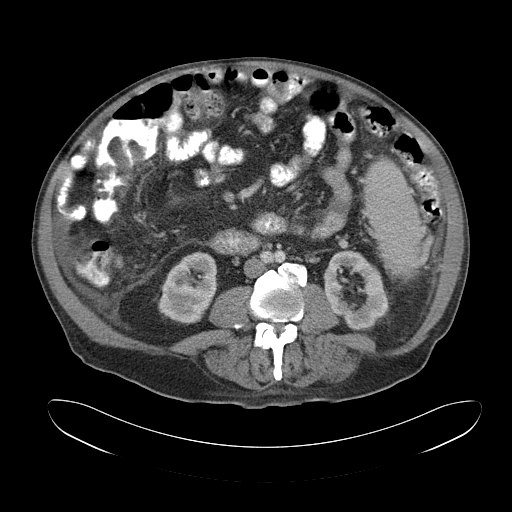
[im 66/131  soft-tissue]
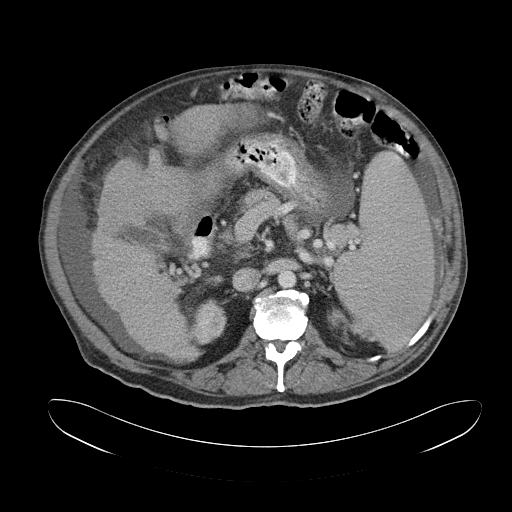
[im 80/131  soft-tissue]
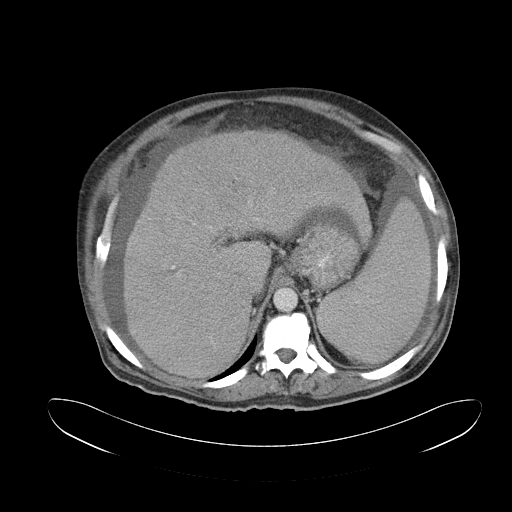
[im 87/131  soft-tissue]
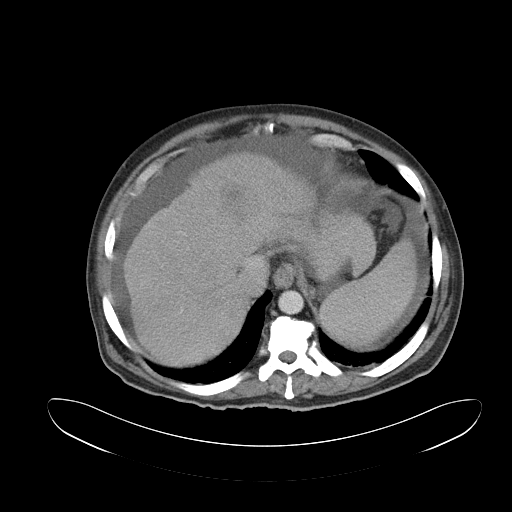
[im 102/131  soft-tissue]
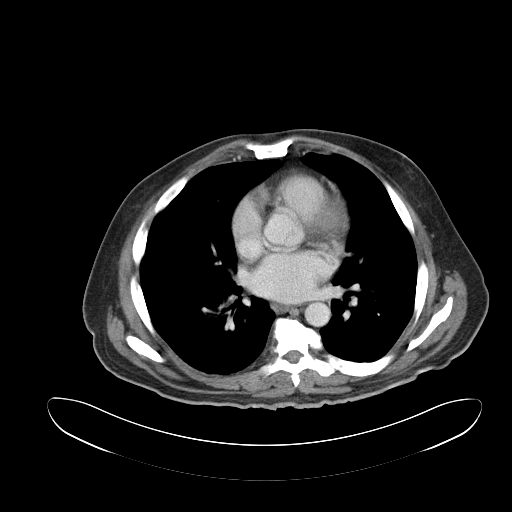
[im 102/131  lung]
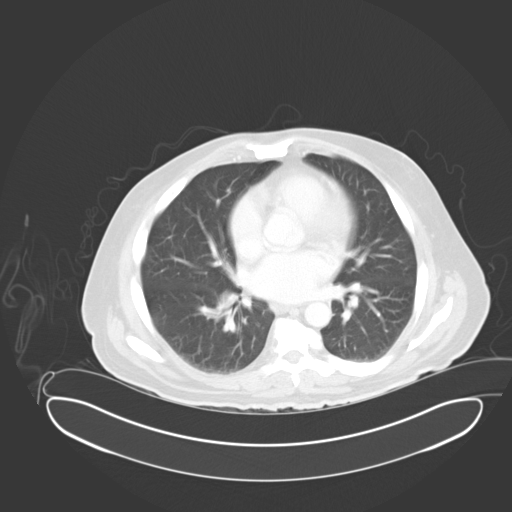
[im 102/131  bone]
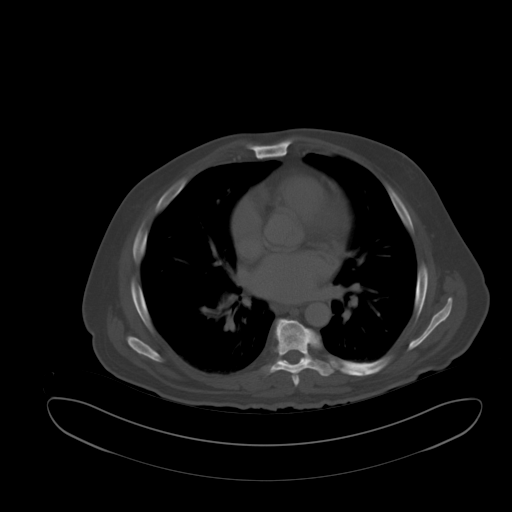
[im 109/131  soft-tissue]
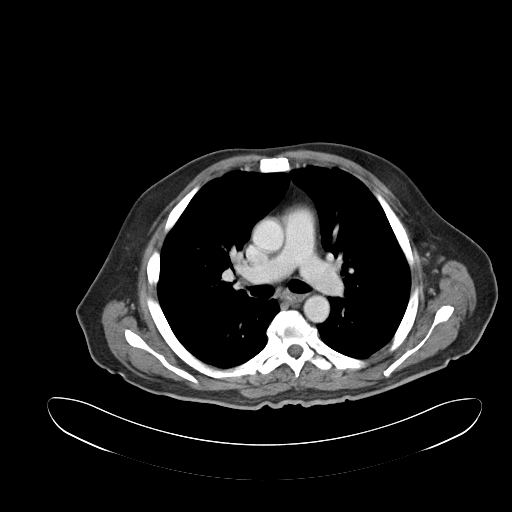
[im 109/131  lung]
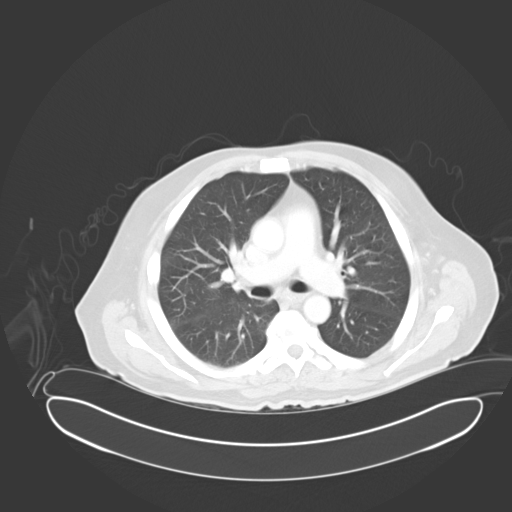
[im 116/131  lung]
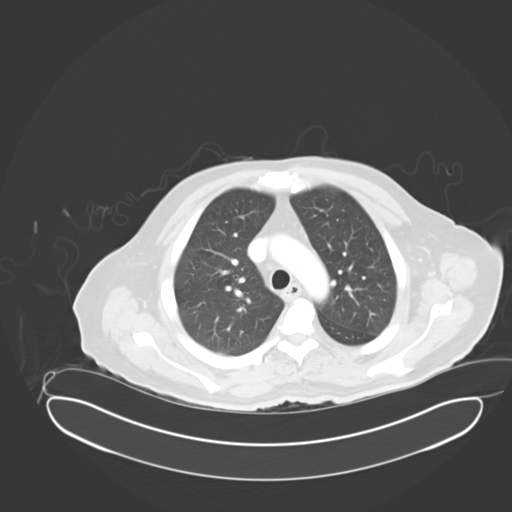
[im 123/131  soft-tissue]
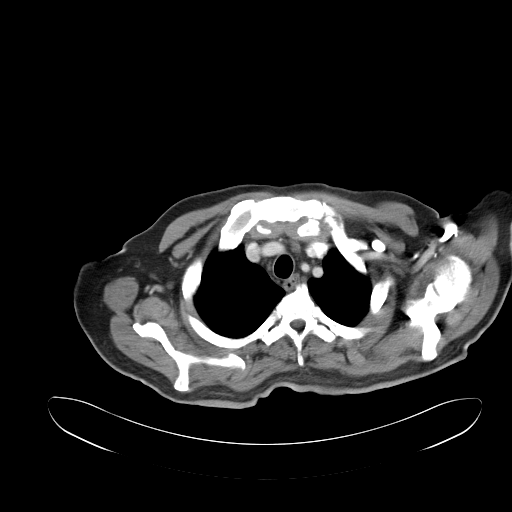
[im 123/131  lung]
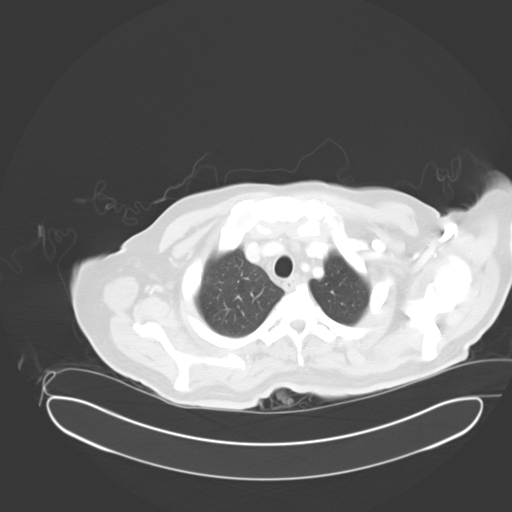

[12 of 32 positions shown; findings below may reference images not displayed]

FINDINGS: CT CHEST FINDINGS

Mediastinum/Lymph Nodes: Heart size is normal. There is no
significant pericardial fluid, thickening or pericardial
calcification. Occluder device in the interatrial septum. No
pathologically enlarged mediastinal or hilar lymph nodes. Esophagus
is unremarkable in appearance. No axillary lymphadenopathy.

Lungs/Pleura: No suspicious appearing pulmonary nodules or masses.
No acute consolidative airspace disease. No pleural effusions.

Musculoskeletal/Soft Tissues: There are no aggressive appearing
lytic or blastic lesions noted in the visualized portions of the
skeleton. Orthopedic fixation hardware in the lower cervical spine
incompletely imaged.

CT ABDOMEN AND PELVIS FINDINGS

Hepatobiliary: Enlarging 3.8 x 4.6 cm hypovascular lesion centered
in segment 4A of the liver (image 45 of series 2), significantly
increased compared to prior study 12/25/2014. Other smaller
low-attenuation lesions in the left lobe of the liver on images 53
and 59, 59 and 64 of series 2 are too small to characterize, but
also appear to be new compared to prior study 12/25/2014, concerning
for multicentric disease. The liver has a shrunken appearance and
nodular contour, compatible with underlying cirrhosis. There is a
single mildly dilated bile duct in the left lobe of the liver (image
49 of series 2), which likely gets compressed as it traverses
adjacent to the previously described left hepatic lobe mass. No
extrahepatic biliary ductal dilatation. Gallbladder is nearly
completely decompressed, and unremarkable in appearance.

Pancreas: No pancreatic mass. No pancreatic ductal dilatation. No
pancreatic or peripancreatic fluid collections.

Spleen: Spleen is enlarged measuring 9.2 x 18.4 x 20.2 cm (estimated
splenic volume of 1,710 mL).

Adrenals/Urinary Tract: Bilateral adrenal glands are normal in
appearance. Multiple sub cm low-attenuation lesions in the kidneys
bilaterally, too small to definitively characterize, but
statistically likely cysts. 2.6 cm simple cyst in the lower pole of
the left kidney. No hydroureteronephrosis. Urinary bladder is normal
in appearance.

Stomach/Bowel: Normal appearance of the stomach. No pathologic
dilatation of small bowel or colon.

Vascular/Lymphatic: Portal vein is mildly dilated measuring 17 mm in
diameter. Numerous portosystemic collateral pathways throughout the
abdomen. Minimal atherosclerosis in the abdominal and pelvic
vasculature. Mild aneurysmal dilatation of the distal left common
iliac artery measuring up to 16 mm in diameter. No lymphadenopathy
noted in the abdomen or pelvis.

Reproductive: Prostate gland and seminal vesicles are normal in
appearance.

Other: Moderate volume of ascites.  No pneumoperitoneum.

Musculoskeletal: Several well-defined lucent lesions are noted in
the iliac bones bilaterally, unchanged compared to prior study
12/25/2014, presumably benign. There are otherwise no new aggressive
appearing lytic or blastic lesions noted in the visualized portions
of the skeleton.
IMPRESSION: 1. Stigmata of cirrhosis with portal hypertension, including
splenomegaly, similar to prior examination 12/25/2014. Importantly,
today's study demonstrates interval enlargement of the previously
noted lesion in the left lobe of the liver, which is currently a
x 4.6 cm mass. In addition, there are several separate lesions in
the left lobe of the liver on this background of cirrhosis, findings
are highly concerning for hepatocellular carcinoma (potentially
multicentric), and further evaluation of AFP level in addition to
MRI of the abdomen with and without IV gadolinium is recommended for
more definitive evaluation at this time.
2. Moderate volume of ascites.
3. Additional incidental findings, as above.
These results will be called to the ordering clinician or
representative by the Radiologist Assistant, and communication
documented in the PACS or zVision Dashboard.

## 2017-06-22 NOTE — Telephone Encounter (Signed)
done
